# Patient Record
Sex: Female | Born: 1935 | Race: White | Hispanic: No | Smoking: Former smoker
Health system: Southern US, Community
[De-identification: ages and names within clinical notes are randomized; demographics above are authoritative.]

## PROBLEM LIST (undated history)

## (undated) DIAGNOSIS — Z85828 Personal history of other malignant neoplasm of skin: Secondary | ICD-10-CM

## (undated) DIAGNOSIS — Z8679 Personal history of other diseases of the circulatory system: Secondary | ICD-10-CM

## (undated) DIAGNOSIS — M1990 Unspecified osteoarthritis, unspecified site: Secondary | ICD-10-CM

## (undated) DIAGNOSIS — I442 Atrioventricular block, complete: Secondary | ICD-10-CM

## (undated) DIAGNOSIS — E7800 Pure hypercholesterolemia, unspecified: Secondary | ICD-10-CM

## (undated) DIAGNOSIS — I495 Sick sinus syndrome: Secondary | ICD-10-CM

## (undated) DIAGNOSIS — Z8719 Personal history of other diseases of the digestive system: Secondary | ICD-10-CM

## (undated) DIAGNOSIS — K449 Diaphragmatic hernia without obstruction or gangrene: Secondary | ICD-10-CM

## (undated) DIAGNOSIS — R159 Full incontinence of feces: Secondary | ICD-10-CM

## (undated) DIAGNOSIS — IMO0001 Reserved for inherently not codable concepts without codable children: Secondary | ICD-10-CM

## (undated) DIAGNOSIS — R32 Unspecified urinary incontinence: Secondary | ICD-10-CM

## (undated) DIAGNOSIS — K635 Polyp of colon: Secondary | ICD-10-CM

## (undated) DIAGNOSIS — K589 Irritable bowel syndrome without diarrhea: Secondary | ICD-10-CM

## (undated) DIAGNOSIS — I10 Essential (primary) hypertension: Secondary | ICD-10-CM

## (undated) DIAGNOSIS — G629 Polyneuropathy, unspecified: Secondary | ICD-10-CM

## (undated) DIAGNOSIS — I872 Venous insufficiency (chronic) (peripheral): Secondary | ICD-10-CM

## (undated) DIAGNOSIS — K5730 Diverticulosis of large intestine without perforation or abscess without bleeding: Secondary | ICD-10-CM

## (undated) DIAGNOSIS — I341 Nonrheumatic mitral (valve) prolapse: Secondary | ICD-10-CM

## (undated) DIAGNOSIS — B059 Measles without complication: Secondary | ICD-10-CM

## (undated) DIAGNOSIS — K219 Gastro-esophageal reflux disease without esophagitis: Secondary | ICD-10-CM

## (undated) DIAGNOSIS — Z5189 Encounter for other specified aftercare: Secondary | ICD-10-CM

## (undated) DIAGNOSIS — B269 Mumps without complication: Secondary | ICD-10-CM

## (undated) DIAGNOSIS — B029 Zoster without complications: Secondary | ICD-10-CM

## (undated) DIAGNOSIS — Z950 Presence of cardiac pacemaker: Secondary | ICD-10-CM

## (undated) DIAGNOSIS — Z8709 Personal history of other diseases of the respiratory system: Secondary | ICD-10-CM

## (undated) DIAGNOSIS — B019 Varicella without complication: Secondary | ICD-10-CM

## (undated) DIAGNOSIS — G6281 Critical illness polyneuropathy: Secondary | ICD-10-CM

## (undated) DIAGNOSIS — Z0000 Encounter for general adult medical examination without abnormal findings: Secondary | ICD-10-CM

## (undated) DIAGNOSIS — M797 Fibromyalgia: Secondary | ICD-10-CM

## (undated) DIAGNOSIS — E039 Hypothyroidism, unspecified: Secondary | ICD-10-CM

## (undated) DIAGNOSIS — Z8619 Personal history of other infectious and parasitic diseases: Secondary | ICD-10-CM

## (undated) DIAGNOSIS — N3281 Overactive bladder: Secondary | ICD-10-CM

## (undated) DIAGNOSIS — R197 Diarrhea, unspecified: Secondary | ICD-10-CM

## (undated) HISTORY — PX: TOTAL ABDOMINAL HYSTERECTOMY W/ BILATERAL SALPINGOOPHORECTOMY: SHX83

## (undated) HISTORY — DX: Encounter for other specified aftercare: Z51.89

## (undated) HISTORY — DX: Polyp of colon: K63.5

## (undated) HISTORY — DX: Gastro-esophageal reflux disease without esophagitis: K21.9

## (undated) HISTORY — DX: Hypothyroidism, unspecified: E03.9

## (undated) HISTORY — DX: Fibromyalgia: M79.7

## (undated) HISTORY — DX: Diarrhea, unspecified: R19.7

## (undated) HISTORY — DX: Unspecified urinary incontinence: R32

## (undated) HISTORY — DX: Presence of cardiac pacemaker: Z95.0

## (undated) HISTORY — PX: CERVICAL SPINE SURGERY: SHX589

## (undated) HISTORY — DX: Encounter for general adult medical examination without abnormal findings: Z00.00

## (undated) HISTORY — DX: Polyneuropathy, unspecified: G62.9

## (undated) HISTORY — DX: Full incontinence of feces: R15.9

## (undated) HISTORY — DX: Pure hypercholesterolemia, unspecified: E78.00

## (undated) HISTORY — DX: Unspecified osteoarthritis, unspecified site: M19.90

## (undated) HISTORY — DX: Irritable bowel syndrome, unspecified: K58.9

## (undated) HISTORY — DX: Critical illness polyneuropathy: G62.81

## (undated) HISTORY — DX: Diverticulosis of large intestine without perforation or abscess without bleeding: K57.30

## (undated) HISTORY — PX: TONSILLECTOMY: SUR1361

## (undated) HISTORY — DX: Personal history of other diseases of the respiratory system: Z87.09

## (undated) HISTORY — PX: COLONOSCOPY: SHX174

## (undated) HISTORY — DX: Mumps without complication: B26.9

## (undated) HISTORY — DX: Diaphragmatic hernia without obstruction or gangrene: K44.9

## (undated) HISTORY — DX: Personal history of other malignant neoplasm of skin: Z85.828

## (undated) HISTORY — DX: Sick sinus syndrome: I49.5

## (undated) HISTORY — PX: OTHER SURGICAL HISTORY: SHX169

## (undated) HISTORY — DX: Nonrheumatic mitral (valve) prolapse: I34.1

## (undated) HISTORY — DX: Personal history of other infectious and parasitic diseases: Z86.19

## (undated) HISTORY — DX: Measles without complication: B05.9

## (undated) HISTORY — DX: Atrioventricular block, complete: I44.2

## (undated) HISTORY — DX: Overactive bladder: N32.81

## (undated) HISTORY — PX: ESOPHAGOGASTRODUODENOSCOPY: SHX1529

## (undated) HISTORY — DX: Essential (primary) hypertension: I10

## (undated) HISTORY — DX: Venous insufficiency (chronic) (peripheral): I87.2

## (undated) HISTORY — DX: Personal history of other diseases of the circulatory system: Z86.79

## (undated) HISTORY — DX: Reserved for inherently not codable concepts without codable children: IMO0001

## (undated) HISTORY — DX: Personal history of other diseases of the digestive system: Z87.19

## (undated) HISTORY — DX: Zoster without complications: B02.9

## (undated) HISTORY — DX: Varicella without complication: B01.9

---

## 1969-10-14 HISTORY — PX: APPENDECTOMY: SHX54

## 1997-05-10 HISTORY — PX: CARDIAC CATHETERIZATION: SHX172

## 1998-04-24 ENCOUNTER — Other Ambulatory Visit: Admission: RE | Admit: 1998-04-24 | Discharge: 1998-04-24 | Payer: Self-pay | Admitting: Obstetrics and Gynecology

## 1999-04-26 ENCOUNTER — Other Ambulatory Visit: Admission: RE | Admit: 1999-04-26 | Discharge: 1999-04-26 | Payer: Self-pay | Admitting: Obstetrics and Gynecology

## 2000-04-28 ENCOUNTER — Other Ambulatory Visit: Admission: RE | Admit: 2000-04-28 | Discharge: 2000-04-28 | Payer: Self-pay | Admitting: Obstetrics and Gynecology

## 2001-06-08 ENCOUNTER — Other Ambulatory Visit: Admission: RE | Admit: 2001-06-08 | Discharge: 2001-06-08 | Payer: Self-pay | Admitting: Obstetrics and Gynecology

## 2002-01-03 ENCOUNTER — Emergency Department (HOSPITAL_COMMUNITY): Admission: EM | Admit: 2002-01-03 | Discharge: 2002-01-04 | Payer: Self-pay | Admitting: Emergency Medicine

## 2002-01-04 ENCOUNTER — Encounter: Payer: Self-pay | Admitting: Emergency Medicine

## 2002-12-24 ENCOUNTER — Encounter: Payer: Self-pay | Admitting: Emergency Medicine

## 2002-12-24 ENCOUNTER — Emergency Department (HOSPITAL_COMMUNITY): Admission: EM | Admit: 2002-12-24 | Discharge: 2002-12-24 | Payer: Self-pay | Admitting: Emergency Medicine

## 2003-08-11 ENCOUNTER — Encounter: Admission: RE | Admit: 2003-08-11 | Discharge: 2003-08-11 | Payer: Self-pay | Admitting: Obstetrics and Gynecology

## 2005-01-16 ENCOUNTER — Ambulatory Visit: Payer: Self-pay | Admitting: Pulmonary Disease

## 2005-01-17 ENCOUNTER — Ambulatory Visit: Payer: Self-pay | Admitting: Pulmonary Disease

## 2005-01-21 ENCOUNTER — Ambulatory Visit: Payer: Self-pay | Admitting: Internal Medicine

## 2005-02-05 ENCOUNTER — Ambulatory Visit: Payer: Self-pay | Admitting: Internal Medicine

## 2005-03-14 HISTORY — PX: CHOLECYSTECTOMY: SHX55

## 2005-03-20 ENCOUNTER — Encounter (INDEPENDENT_AMBULATORY_CARE_PROVIDER_SITE_OTHER): Payer: Self-pay | Admitting: *Deleted

## 2005-03-20 ENCOUNTER — Ambulatory Visit (HOSPITAL_COMMUNITY): Admission: RE | Admit: 2005-03-20 | Discharge: 2005-03-21 | Payer: Self-pay | Admitting: General Surgery

## 2005-07-17 ENCOUNTER — Ambulatory Visit: Payer: Self-pay | Admitting: Pulmonary Disease

## 2005-07-29 ENCOUNTER — Ambulatory Visit: Payer: Self-pay | Admitting: Pulmonary Disease

## 2005-08-02 ENCOUNTER — Ambulatory Visit (HOSPITAL_COMMUNITY): Admission: RE | Admit: 2005-08-02 | Discharge: 2005-08-03 | Payer: Self-pay | Admitting: Cardiovascular Disease

## 2006-01-13 ENCOUNTER — Ambulatory Visit: Payer: Self-pay | Admitting: Pulmonary Disease

## 2006-01-25 ENCOUNTER — Emergency Department (HOSPITAL_COMMUNITY): Admission: EM | Admit: 2006-01-25 | Discharge: 2006-01-25 | Payer: Self-pay | Admitting: Emergency Medicine

## 2006-07-17 ENCOUNTER — Ambulatory Visit: Payer: Self-pay | Admitting: Internal Medicine

## 2006-07-21 ENCOUNTER — Ambulatory Visit: Payer: Self-pay | Admitting: Cardiovascular Disease

## 2007-01-14 ENCOUNTER — Ambulatory Visit: Payer: Self-pay | Admitting: Pulmonary Disease

## 2007-01-16 ENCOUNTER — Ambulatory Visit: Payer: Self-pay | Admitting: Internal Medicine

## 2007-02-05 ENCOUNTER — Encounter: Payer: Self-pay | Admitting: Internal Medicine

## 2007-02-05 ENCOUNTER — Ambulatory Visit: Payer: Self-pay | Admitting: Internal Medicine

## 2007-02-05 DIAGNOSIS — K5730 Diverticulosis of large intestine without perforation or abscess without bleeding: Secondary | ICD-10-CM

## 2007-02-05 HISTORY — DX: Diverticulosis of large intestine without perforation or abscess without bleeding: K57.30

## 2007-07-31 ENCOUNTER — Emergency Department (HOSPITAL_COMMUNITY): Admission: EM | Admit: 2007-07-31 | Discharge: 2007-07-31 | Payer: Self-pay | Admitting: Emergency Medicine

## 2008-02-02 ENCOUNTER — Encounter: Payer: Self-pay | Admitting: Pulmonary Disease

## 2008-02-29 ENCOUNTER — Encounter: Payer: Self-pay | Admitting: Pulmonary Disease

## 2008-02-29 DIAGNOSIS — G609 Hereditary and idiopathic neuropathy, unspecified: Secondary | ICD-10-CM | POA: Insufficient documentation

## 2008-02-29 DIAGNOSIS — E039 Hypothyroidism, unspecified: Secondary | ICD-10-CM

## 2008-02-29 DIAGNOSIS — Z86010 Personal history of colonic polyps: Secondary | ICD-10-CM

## 2008-02-29 DIAGNOSIS — Z85828 Personal history of other malignant neoplasm of skin: Secondary | ICD-10-CM

## 2008-02-29 DIAGNOSIS — Z8679 Personal history of other diseases of the circulatory system: Secondary | ICD-10-CM | POA: Insufficient documentation

## 2008-02-29 DIAGNOSIS — I059 Rheumatic mitral valve disease, unspecified: Secondary | ICD-10-CM | POA: Insufficient documentation

## 2008-02-29 DIAGNOSIS — IMO0001 Reserved for inherently not codable concepts without codable children: Secondary | ICD-10-CM

## 2008-02-29 DIAGNOSIS — K449 Diaphragmatic hernia without obstruction or gangrene: Secondary | ICD-10-CM | POA: Insufficient documentation

## 2008-02-29 DIAGNOSIS — I10 Essential (primary) hypertension: Secondary | ICD-10-CM

## 2008-05-23 ENCOUNTER — Encounter: Payer: Self-pay | Admitting: Pulmonary Disease

## 2008-05-27 ENCOUNTER — Telehealth (INDEPENDENT_AMBULATORY_CARE_PROVIDER_SITE_OTHER): Payer: Self-pay | Admitting: *Deleted

## 2008-06-22 ENCOUNTER — Ambulatory Visit: Payer: Self-pay | Admitting: Pulmonary Disease

## 2008-06-22 DIAGNOSIS — J869 Pyothorax without fistula: Secondary | ICD-10-CM | POA: Insufficient documentation

## 2008-06-23 DIAGNOSIS — I4891 Unspecified atrial fibrillation: Secondary | ICD-10-CM

## 2008-06-23 DIAGNOSIS — G6281 Critical illness polyneuropathy: Secondary | ICD-10-CM

## 2008-06-23 DIAGNOSIS — I872 Venous insufficiency (chronic) (peripheral): Secondary | ICD-10-CM

## 2008-06-28 ENCOUNTER — Ambulatory Visit: Payer: Self-pay | Admitting: Pulmonary Disease

## 2008-07-10 LAB — CONVERTED CEMR LAB
ALT: 15 units/L (ref 0–35)
AST: 21 units/L (ref 0–37)
Albumin: 4 g/dL (ref 3.5–5.2)
Alkaline Phosphatase: 75 units/L (ref 39–117)
BUN: 11 mg/dL (ref 6–23)
Basophils Absolute: 0 10*3/uL (ref 0.0–0.1)
Basophils Relative: 0.6 % (ref 0.0–3.0)
Bilirubin, Direct: 0.1 mg/dL (ref 0.0–0.3)
CO2: 29 meq/L (ref 19–32)
Calcium: 9.4 mg/dL (ref 8.4–10.5)
Chloride: 110 meq/L (ref 96–112)
Cholesterol: 268 mg/dL (ref 0–200)
Creatinine, Ser: 1.2 mg/dL (ref 0.4–1.2)
Direct LDL: 148.9 mg/dL
Eosinophils Absolute: 0.1 10*3/uL (ref 0.0–0.7)
Eosinophils Relative: 2 % (ref 0.0–5.0)
GFR calc Af Amer: 56 mL/min
GFR calc non Af Amer: 46 mL/min
Glucose, Bld: 95 mg/dL (ref 70–99)
HCT: 42 % (ref 36.0–46.0)
HDL: 41.6 mg/dL (ref >39.0)
Hemoglobin: 14.5 g/dL (ref 12.0–15.0)
Lymphocytes Relative: 20.4 % (ref 12.0–46.0)
MCHC: 34.4 g/dL (ref 30.0–36.0)
MCV: 91.1 fL (ref 78.0–100.0)
Monocytes Absolute: 0.5 10*3/uL (ref 0.1–1.0)
Monocytes Relative: 8.8 % (ref 3.0–12.0)
Neutro Abs: 3.9 10*3/uL (ref 1.4–7.7)
Neutrophils Relative %: 68.2 % (ref 43.0–77.0)
Platelets: 200 10*3/uL (ref 150–400)
Potassium: 4.5 meq/L (ref 3.5–5.1)
RBC: 4.61 M/uL (ref 3.87–5.11)
RDW: 12.3 % (ref 11.5–14.6)
Sodium: 142 meq/L (ref 135–145)
TSH: 5.09 microintl units/mL (ref 0.35–5.50)
Total Bilirubin: 0.8 mg/dL (ref 0.3–1.2)
Total CHOL/HDL Ratio: 6.4
Total Protein: 7.5 g/dL (ref 6.0–8.3)
Triglycerides: 220 mg/dL (ref 0–149)
VLDL: 44 mg/dL — ABNORMAL HIGH (ref 0–40)
Vit D, 1,25-Dihydroxy: 16 — ABNORMAL LOW (ref 30–89)
WBC: 5.7 10*3/uL (ref 4.5–10.5)

## 2008-08-09 ENCOUNTER — Encounter: Payer: Self-pay | Admitting: Pulmonary Disease

## 2008-08-24 ENCOUNTER — Encounter: Payer: Self-pay | Admitting: Pulmonary Disease

## 2008-10-14 LAB — HM PAP SMEAR

## 2008-11-08 ENCOUNTER — Encounter: Payer: Self-pay | Admitting: Pulmonary Disease

## 2008-11-16 ENCOUNTER — Telehealth (INDEPENDENT_AMBULATORY_CARE_PROVIDER_SITE_OTHER): Payer: Self-pay | Admitting: *Deleted

## 2008-11-29 ENCOUNTER — Encounter: Payer: Self-pay | Admitting: Pulmonary Disease

## 2008-12-08 ENCOUNTER — Telehealth (INDEPENDENT_AMBULATORY_CARE_PROVIDER_SITE_OTHER): Payer: Self-pay | Admitting: *Deleted

## 2009-02-14 ENCOUNTER — Telehealth (INDEPENDENT_AMBULATORY_CARE_PROVIDER_SITE_OTHER): Payer: Self-pay | Admitting: *Deleted

## 2009-03-03 ENCOUNTER — Encounter: Payer: Self-pay | Admitting: Pulmonary Disease

## 2009-04-06 ENCOUNTER — Encounter: Payer: Self-pay | Admitting: Pulmonary Disease

## 2009-09-04 ENCOUNTER — Encounter: Payer: Self-pay | Admitting: Pulmonary Disease

## 2009-10-17 ENCOUNTER — Ambulatory Visit: Payer: Self-pay | Admitting: Pulmonary Disease

## 2009-10-17 DIAGNOSIS — E7800 Pure hypercholesterolemia, unspecified: Secondary | ICD-10-CM

## 2009-10-17 DIAGNOSIS — E559 Vitamin D deficiency, unspecified: Secondary | ICD-10-CM | POA: Insufficient documentation

## 2009-10-18 LAB — CONVERTED CEMR LAB
ALT: 22 units/L (ref 0–35)
AST: 26 units/L (ref 0–37)
Albumin: 3.5 g/dL (ref 3.5–5.2)
Alkaline Phosphatase: 66 units/L (ref 39–117)
BUN: 14 mg/dL (ref 6–23)
Basophils Absolute: 0 10*3/uL (ref 0.0–0.1)
Basophils Relative: 0.1 % (ref 0.0–3.0)
Bilirubin, Direct: 0.1 mg/dL (ref 0.0–0.3)
CO2: 28 meq/L (ref 19–32)
Calcium: 9.1 mg/dL (ref 8.4–10.5)
Chloride: 106 meq/L (ref 96–112)
Cholesterol: 152 mg/dL (ref 0–200)
Creatinine, Ser: 1.3 mg/dL — ABNORMAL HIGH (ref 0.4–1.2)
Eosinophils Absolute: 0.1 10*3/uL (ref 0.0–0.7)
Eosinophils Relative: 2.4 % (ref 0.0–5.0)
Folate: 6.4 ng/mL
GFR calc non Af Amer: 41.75 mL/min (ref >60)
Glucose, Bld: 94 mg/dL (ref 70–99)
HCT: 39.3 % (ref 36.0–46.0)
HDL: 48.4 mg/dL (ref >39.00)
Hemoglobin: 13 g/dL (ref 12.0–15.0)
Hgb A1c MFr Bld: 5.2 % (ref 4.6–6.5)
Iron: 59 ug/dL (ref 42–145)
LDL Cholesterol: 82 mg/dL (ref 0–99)
Lymphocytes Relative: 11.4 % — ABNORMAL LOW (ref 12.0–46.0)
Lymphs Abs: 0.7 10*3/uL (ref 0.7–4.0)
MCHC: 33.2 g/dL (ref 30.0–36.0)
MCV: 94.3 fL (ref 78.0–100.0)
Monocytes Absolute: 0.4 10*3/uL (ref 0.1–1.0)
Monocytes Relative: 6.7 % (ref 3.0–12.0)
Neutro Abs: 5 10*3/uL (ref 1.4–7.7)
Neutrophils Relative %: 79.4 % — ABNORMAL HIGH (ref 43.0–77.0)
Platelets: 161 10*3/uL (ref 150.0–400.0)
Potassium: 4.2 meq/L (ref 3.5–5.1)
Pro B Natriuretic peptide (BNP): 321 pg/mL — ABNORMAL HIGH (ref 0.0–100.0)
RBC: 4.17 M/uL (ref 3.87–5.11)
RDW: 11.9 % (ref 11.5–14.6)
Saturation Ratios: 16.7 % — ABNORMAL LOW (ref 20.0–50.0)
Sed Rate: 30 mm/hr — ABNORMAL HIGH (ref 0–22)
Sodium: 141 meq/L (ref 135–145)
TSH: 4.59 microintl units/mL (ref 0.35–5.50)
Total Bilirubin: 0.7 mg/dL (ref 0.3–1.2)
Total CHOL/HDL Ratio: 3
Total Protein: 6.9 g/dL (ref 6.0–8.3)
Transferrin: 252.9 mg/dL (ref 212.0–360.0)
Triglycerides: 109 mg/dL (ref 0.0–149.0)
VLDL: 21.8 mg/dL (ref 0.0–40.0)
Vit D, 25-Hydroxy: 34 ng/mL (ref 30–89)
Vitamin B-12: 267 pg/mL (ref 211–911)
WBC: 6.2 10*3/uL (ref 4.5–10.5)

## 2009-11-22 ENCOUNTER — Encounter: Payer: Self-pay | Admitting: Pulmonary Disease

## 2009-12-04 ENCOUNTER — Encounter: Payer: Self-pay | Admitting: Pulmonary Disease

## 2010-01-17 ENCOUNTER — Encounter: Payer: Self-pay | Admitting: Pulmonary Disease

## 2010-03-26 ENCOUNTER — Telehealth (INDEPENDENT_AMBULATORY_CARE_PROVIDER_SITE_OTHER): Payer: Self-pay | Admitting: *Deleted

## 2010-04-19 ENCOUNTER — Ambulatory Visit: Payer: Self-pay | Admitting: Pulmonary Disease

## 2010-04-19 ENCOUNTER — Encounter (INDEPENDENT_AMBULATORY_CARE_PROVIDER_SITE_OTHER): Payer: Self-pay | Admitting: *Deleted

## 2010-04-19 DIAGNOSIS — K589 Irritable bowel syndrome without diarrhea: Secondary | ICD-10-CM

## 2010-06-07 ENCOUNTER — Encounter: Payer: Self-pay | Admitting: Pulmonary Disease

## 2010-08-01 ENCOUNTER — Encounter: Payer: Self-pay | Admitting: Pulmonary Disease

## 2010-08-24 ENCOUNTER — Encounter: Payer: Self-pay | Admitting: Pulmonary Disease

## 2010-08-24 HISTORY — PX: NM MYOCAR PERF WALL MOTION: HXRAD629

## 2010-09-09 ENCOUNTER — Inpatient Hospital Stay (HOSPITAL_COMMUNITY): Admission: EM | Admit: 2010-09-09 | Discharge: 2010-09-13 | Payer: Self-pay | Admitting: Emergency Medicine

## 2010-09-09 ENCOUNTER — Encounter (INDEPENDENT_AMBULATORY_CARE_PROVIDER_SITE_OTHER): Payer: Self-pay | Admitting: Emergency Medicine

## 2010-09-17 ENCOUNTER — Telehealth (INDEPENDENT_AMBULATORY_CARE_PROVIDER_SITE_OTHER): Payer: Self-pay | Admitting: *Deleted

## 2010-09-24 ENCOUNTER — Ambulatory Visit: Payer: Self-pay | Admitting: Pulmonary Disease

## 2010-09-24 DIAGNOSIS — L02419 Cutaneous abscess of limb, unspecified: Secondary | ICD-10-CM | POA: Insufficient documentation

## 2010-09-24 DIAGNOSIS — L03119 Cellulitis of unspecified part of limb: Secondary | ICD-10-CM

## 2010-10-29 ENCOUNTER — Encounter: Payer: Self-pay | Admitting: Pulmonary Disease

## 2010-11-04 ENCOUNTER — Encounter: Payer: Self-pay | Admitting: Internal Medicine

## 2010-11-13 NOTE — Letter (Signed)
Summary: [facility]  [facility]   Imported By: Bubba Hales 12/25/2009 09:42:32  _____________________________________________________________________  External Attachment:    Type:   Image     Comment:   External Document

## 2010-11-13 NOTE — Letter (Signed)
Summary: New Patient letter  [facility]  [address] [place], [place] [address]   Phone: [phone]  Fax: [phone]       04/19/2010 MRN: [id number]  Jill Washington [place], [place]  [id number]  Dear Ms. Jill Washington,  Welcome to the Gastroenterology Division at [facility].    You are scheduled to see Dr. Carlean Purl on 06/12/2010 at 8:45AM on the 3rd floor at [facility], [place] [employer].  We ask that you try to arrive at our office 15 minutes prior to your appointment time to allow for check-in.  We would like you to complete the enclosed self-administered evaluation form prior to your visit and bring it with you on the day of your appointment.  We will review it with you.  Also, please bring a complete list of all your medications or, if you prefer, bring the medication bottles and we will list them.  Please bring your insurance card so that we may make a copy of it.  If your insurance requires a referral to see a specialist, please bring your referral form from your primary care physician.  Co-payments are due at the time of your visit and may be paid by cash, check or credit card.     Your office visit will consist of a consult with your physician (includes a physical exam), any laboratory testing he/she may order, scheduling of any necessary diagnostic testing (e.g. x-ray, ultrasound, CT-scan), and scheduling of a procedure (e.g. Endoscopy, Colonoscopy) if required.  Please allow enough time on your schedule to allow for any/all of these possibilities.    If you cannot keep your appointment, please call [phone] to cancel or reschedule prior to your appointment date.  This allows [place] the opportunity to schedule an appointment for another patient in need of care.  If you do not cancel or reschedule by 5 p.m. the business day prior to your appointment date, you will be charged a $50.00 late cancellation/no-show fee.    Thank you for  choosing [facility] for your medical needs.  We appreciate the opportunity to care for you.  Please visit [place] at our website  to learn more about our practice.                     Sincerely,                                                             The Gastroenterology Division

## 2010-11-13 NOTE — Progress Notes (Signed)
Summary: rx  Phone Note Call from Patient Call back at Home Phone [phone]   Caller: Patient Call For: nadel Reason for Call: Talk to Nurse Summary of Call: pt needs a refill on her warfarin and pravastatin called in to [facility] for 90 day supply w/refills. Initial call taken by: Zigmund Gottron,  March 26, 2010 3:14 PM  Follow-up for Phone Call        called spoke with patient, informed her that we will be happy to fill her pravastatin but since Dr. Lowella Fairy office does her coumadin she will have to contact his office for refills.  pt verbalized her understanding.  [facility]. Follow-up by: Parke Poisson CNA/MA,  March 26, 2010 3:34 PM    Prescriptions: PRAVASTATIN SODIUM 40 MG TABS (PRAVASTATIN SODIUM) Take 1 tablet by mouth once a day  #90 x 3   Entered by:   Parke Poisson CNA/MA   Authorized by:   Noralee Space MD   Signed by:   Parke Poisson CNA/MA on 03/26/2010   Method used:   Faxed to ...       [facility] Probation [place]  [address]       Ph: [phone]       Fax: [phone]   RxID:   [id number]

## 2010-11-13 NOTE — Letter (Signed)
Summary: [facility]   Imported By: Phillis Knack 01/30/2010 13:26:36  _____________________________________________________________________  External Attachment:    Type:   Image     Comment:   External Document

## 2010-11-13 NOTE — Assessment & Plan Note (Signed)
Summary: rov 6 months///kp   CC:  6 month ROV & review of mult medical problems....  History of Present Illness: 77 y/o WF here for a follow up visit... she has multiple medical problems as noted below... Followed for general medical purposes w/ hx of Empyema & critical illness in 1982 from which she made a nice recovery, HBP, MVP, AFib & SSS w/ pacer, Hyperchol, Hypothy, HH/ IBS/ Colon Polyps, Neuropathy/ FM... she is also followed by [name] for Cards, [name] for GI, & [name] for Neuro...   ~  October 17, 2009:  2mo ROV- incr stress> sold home, moved in w/ daugh, etc.. c/o some acute right post CWP from lifting wood for their wood stoves... sore tender- Rx w/ rest, heat, Tramadol + Tylenol... she sees [name] every 21mo- changed Amio to Multaq, Pacer OK, overall stable... she does c/o progresssive neuropathy symptoms in her legs- [name] tried change to Cymbalta but she reacted w/ trembling, weakness, confusion; therefore re-started her Gabapentin... we discussed referral to Neurology for Neuropathy f/u... she has been on PRAV40qhs & VitD 50K weekly for >7yr- check fasting labs.   ~  April 19, 2010:  states she sold house & now moving into trailer... c/o "stomach" problems w/ pain, gas, diarrhea> taking gas meds and immodium for her IBS & we discussed LEVSIN for the cramps & getting back on the [place] daily... she would like to f/u w/ [name] & we will help her to set this up... BP & cardiac stable on meds;  she remains on Coumadin via [facility] office;  Chol & Thyroid stable on meds;  she had Neuro f/u [name]> NCV w/ severe neuropathy- on Neurontin & Tramadol...    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  HYPERTENSION (ICD-401.9) - on ATENOLOL 50mg /d &  DIOVAN/Hct 160-12.5 daily... BP today = 150/68, taking meds  regularly, tolerating meds well... she denies HA, fatigue, visual changes, CP, palipit, dizziness, syncope, dyspnea, edema, etc... she will monitor BP at home for [place]...  MITRAL VALVE PROLAPSE (ICD-424.0) - no complaints of CP or palpit... [facility]cho 4/06 [facility] showed mild intermittent prolapse of the ant leaflet of the MV, norm LV size w/ mod ASH... ** note- cath in 1998 showed norm coronaries + MVP... NuclearStressTest 8/05 showed inferior wall artifact, no ischemia, EF= 69%...  ~  f/u Myoview 11/09 showed norm perfusion w/o infarct, w/o ischemia...  ~  repeat 2DEcho 6/10 showed norm LVF, norm valves, DD...  Hx of ATRIAL FIBRILLATION (ICD-427.31) & SICK SINUS SYNDROME (ICD-427.81) - on COUMADIN & MULTAQ 400mg Bid, followed by [facility]... pacemaker placed by [name] in 1998 ([facility]) and followed by him... Generator changed 10/06 and Pacer checked periodically- doing well...   ~  11/10:  [name] changed Amio to [place] 400mg Bid...  VENOUS INSUFFICIENCY (ICD-459.81) - she follows a low sodium diet, elevates legs, support hose when able...  HYPERCHOLESTEROLEMIA (ICD-272.0) - on PRAVASTATIN 40mg Qhs + diet & exercise...  ~  [place] 9/09 on diet alone showed TChol 268, TG 220, HDL 42, LDL 149... Prav40 started.  ~  [place] 1/11 on Prav40 showed TChol 152, TG 109, HDL 48, LDL 82... keep same.  HYPOTHYROIDISM (ICD-244.9) - on LEVOTHYROID 154mcg/d... she is due for f/u labs...  ~  last labs 10/08 by [name] showed TSH= 6.31... dose incr to 125 at that time...  ~  labs 9/09 on  [id number] showed TSH= 5.09  ~  labs 1/11 on Levo125 showed TSH= 4.59  HIATAL HERNIA (ICD-553.3) - on NEXIUM 40mg  Prn & she is asked to take this daily... last EGD 4/06 showed HH, otherw neg...  POLYP, COLON (ICD-211.3) - last colonoscopy 4/08 by [name] showed divertics, 64mm polyp removed= hyperplastic...  and some spasm- "IBS response" to scope...  she has LEVSIN 0.125mg  Prn for cramping, and IMMODIUM Prn for diarrhea.  FIBROMYALGIA (ICD-729.1) -  she uses TRAMADOL 50mg  Prn & METHOCARBAMOL Prn for musc spasm.  VITAMIN D DEFICIENCY (ICD-268.9) - prev on Vit D 50K weekly & switched to 1000 u OTC daily...  ~  labs 9/09 showed Vit D level = 16... start Vit D 50.  ~  labs 1/11 on VitD 50K showed Vit D level = 34... OK switch to 1000 u daily.  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  ~  11/10:  [name] tried switch Neurontin to Cymbalta for neuropathy symptoms- she reacted to the Cymbalta w/ trembling, weakness, confusion & changed back to GABAPENTIN 300mg  Qd... we discussed Neuro appt for further eval.  SKIN CANCER, HX OF (ICD-V10.83) - followed by Derm... 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  Health Maintenance - GYN= DrRKaplan but she understood him to say she didn't need f/u... had Mammogram & BMD in ?2006 but she's not sure...  takes Calcium, MVI, Vit D...   Allergies (verified): No Known Drug Allergies  Comments:  Nurse/Medical Assistant: The patient's medications and allergies were reviewed with the patient and were updated in the Medication and Allergy Lists.  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 Mother died age 31  from pancreatic cancer Father died age 34---from gall bladder disease 1 sibling alive age 2 w/ diabetes 1 sibling alive age 51 w/ arthritis 1 sibling deceased age 26 from kidney failure 1 sibling deceased age 16 from heart attack  Social History: Reviewed history from 06/22/2008 and no changes required. quit smoking in 1957 not exercising no caffeine use no alcohol widowed 3 children  Review of Systems      See HPI       The patient complains of decreased hearing, dyspnea on exertion, peripheral edema, muscle weakness, and difficulty walking.  The patient denies anorexia, fever, weight loss, weight gain, vision loss, hoarseness, chest pain, syncope, prolonged cough, headaches, hemoptysis, abdominal pain, melena, hematochezia, severe indigestion/heartburn, hematuria, incontinence, suspicious skin lesions, transient blindness, depression, unusual weight change, abnormal bleeding, enlarged lymph nodes, and angioedema.    Vital Signs:  Patient profile:   77 year old female Height:      66 inches Weight:      195 pounds O2 Sat:      96 % on Room air Temp:     97.3 degrees F oral Pulse rate:   83 / minute BP sitting:   150 / 68  (left arm) Cuff size:   regular  Vitals Entered By:  Elita Boone CMA (April 19, 2010 9:52 AM)  O2 Sat at Rest %:  96 O2 Flow:  Room air CC: 6 month ROV & review of mult medical problems...   Physical Exam  Additional Exam:  WD, WN, 77 y/o WF in NAD... GENERAL:  Alert & oriented; pleasant & cooperative... HEENT:  [place]/AT, EOM-wnl, PERRLA, EACs-clear, TMs-wnl, NOSE-clear, THROAT-clear & wnl. NECK:  Supple w/ fairROM; no JVD; normal carotid impulses w/o bruits; no thyromegaly or nodules palpated; no lymphadenopathy. CHEST:  Clear to P & A; without wheezes/ rales/ or rhonchi heard... HEART:  Regular Rhythm; pacer, gr 1-2/6 SEM S4 no rubs or S3... ABDOMEN:  Soft & nontender; normal bowel sounds; no organomegaly or masses detected. EXT: without deformities,  mod arthritic changes; no varicose veins/ +venous insuffic/ 1+edema. NEURO:  CN's intact; motor testing normal; sensory testing normal; gait normal & balance OK. DERM:  dry skin & port-wine birthmark on left chest...    MISC. Report  Procedure date:  04/19/2010  Findings:      DATA REVIEWED:  ~  prev EMR notes...  ~  01/17/10 OV note from [name]...  ~  11/22/09 Neuro consult from [name] & EMG/NCV...    Impression & Recommendations:  Problem # 1:  HYPERTENSION (ICD-401.9) Fair control>  reminded of no salt, continue same meds... Her updated medication list for this problem includes:    Atenolol 50 Mg Tabs (Atenolol) .Marland Kitchen... 1 by mouth once daily    Diovan Hct 160-12.5 Mg Tabs (Valsartan-hydrochlorothiazide) .Marland Kitchen... Take 1 tab by mouth once daily...  Problem # 2:  Hx of ATRIAL FIBRILLATION (ICD-427.31) On Coumadin by [facility], and followed regularly by [name]... continue same meds. Her updated medication list for this problem includes:    Coumadin 2 Mg Tabs (Warfarin sodium) .Marland Kitchen... Take as directed by drweintraub...    Atenolol 50 Mg Tabs (Atenolol) .Marland Kitchen... 1 by mouth once daily    Multaq 400 Mg Tabs (Dronedarone hcl) .Marland Kitchen... Take 1 tab by mouth two times a day...  Problem # 3:  HYPERCHOLESTEROLEMIA (ICD-272.0) Stable on Prav40... continue same + diet Rx. Her updated medication list for this problem includes:    Pravastatin Sodium 40 Mg Tabs (Pravastatin sodium) .Marland Kitchen... Take 1 tablet by mouth once a day  Problem # 4:  HYPOTHYROIDISM (ICD-244.9) Stable on Synthroid 125> continue same. Her updated medication list for this problem includes:    Levothroid 125 Mcg Tabs (Levothyroxine sodium) .Marland Kitchen... Take 1 tablet by mouth once a day  Problem # 5:  HIATAL HERNIA (ICD-553.3) We discussed taking the Nexium daily... Her updated medication list for this problem includes:    Nexium 40 Mg Cpdr (Esomeprazole magnesium) .Marland Kitchen... Take 1 cap by mouth 30 min before the 1st meal of the day...     Hyoscyamine Sulfate 0.125 Mg Tabs (Hyoscyamine sulfate) .Marland Kitchen... Take 1 tab by mouth every 6h as needed for abd cramping...  Problem # 6:  IBS>>> Added Levsin Q4H as needed... + Immodium for diarrhea, and Simethacone for gas... she requests f/u GI [name].  Problem # 7:  PERIPHERAL NEUROPATHY (ICD-356.9) Severe Neuroopathy w/ eval from [name]...  Problem # 8:  OTHER MEDICAL PROBLEMS AS NOTED>>>  Complete Medication List: 1)  Coumadin 2 Mg Tabs (Warfarin sodium) .... Take as directed by drweintraub.Marland KitchenMarland Kitchen 2)  Atenolol 50 Mg Tabs (Atenolol) .Marland Kitchen.. 1 by mouth once daily 3)  Diovan Hct 160-12.5 Mg Tabs (Valsartan-hydrochlorothiazide) .... Take 1 tab by mouth once daily.Marland KitchenMarland Kitchen 4)  Multaq 400 Mg Tabs (Dronedarone hcl) .Marland KitchenMarland KitchenMarland Kitchen  Take 1 tab by mouth two times a day... 5)  Pravastatin Sodium 40 Mg Tabs (Pravastatin sodium) .... Take 1 tablet by mouth once a day 6)  Levothroid 125 Mcg Tabs (Levothyroxine sodium) .... Take 1 tablet by mouth once a day 7)  Nexium 40 Mg Cpdr (Esomeprazole magnesium) .... Take 1 cap by mouth 30 min before the 1st meal of the day... 8)  Hyoscyamine Sulfate 0.125 Mg Tabs (Hyoscyamine sulfate) .... Take 1 tab by mouth every 6h as needed for abd cramping... 9)  Imodium A-d 2 Mg Tabs (Loperamide hcl) .... Take 1-2 tabs by mouth as needed for diarrhea... 10)  Gabapentin 300 Mg Caps (Gabapentin) .... Take 1 tablet by mouth once a day 11)  Tramadol Hcl 50 Mg Tabs (Tramadol hcl) .... Take 1 tab by mouth every 6-8h as needed for pain (may take w/ tylenol) 12)  Womens Multivitamin Plus Tabs (Multiple vitamins-minerals) .... Take 1 tab by mouth once daily...  Other Orders: Gastroenterology Referral (GI)  Patient Instructions: 1)  Today we updated your med list- see below.... 2)  We refilled the meds you requested today... 3)  For the Abd Cramping:  take the generic Levsin every [place] or so as needed... 4)  For the Gas: you may use OTC Gas-X, Bean-O, or MYLICON (simethacone)>> take up to 4  times daily as needed... 5)  We will sched a follow up eval w/ [name] for you... 6)  Call for any questions.Marland KitchenMarland Kitchen 7)  Please schedule a follow-up appointment in 6 months. Prescriptions: NEXIUM 40 MG CPDR (ESOMEPRAZOLE MAGNESIUM) take 1 cap by mouth 30 min before the 1st meal of the day...  #30 x 11   Entered and Authorized by:   Noralee Space MD   Signed by:   Noralee Space MD on 04/19/2010   Method used:   Print then Give to Patient   [id number] TRAMADOL HCL 50 MG TABS (TRAMADOL HCL) take 1 tab by mouth every 6-8H as needed for pain (may take w/ Tylenol)  #100 x 11   Entered and Authorized by:   Noralee Space MD   Signed by:   Noralee Space MD on 04/19/2010   Method used:   Print then Give to Patient   RxID:   [id number] HYOSCYAMINE SULFATE 0.125 MG TABS (HYOSCYAMINE SULFATE) take 1 tab by mouth every [place] as needed for abd cramping...  #100 x 11   Entered and Authorized by:   Noralee Space MD   Signed by:   Noralee Space MD on 04/19/2010   Method used:   Print then Give to Patient   RxID:   [id number]

## 2010-11-13 NOTE — Assessment & Plan Note (Signed)
Summary: ROV//MBW   CC:  16 month ROV & review of mult medical problems....  History of Present Illness: 77 y/o WF here for a follow up visit... she has multiple medical problems as noted below...    ~  Sep09:  since I saw her last in 01/21/2023 her husb has passed away (intra-peritoneal carcinoma of ? primary), she has been under some stress but she is holding-her-own and improving slowly... she is also followed by [name] w/ hx AFib, SSS, pacer- we do not have a recent note from his office...   ~  October 17, 2009:  50mo ROV- incr stress> sold home, moved in w/ daugh, etc.. c/o some acute right post CWP from lifting wood for their wood stoves... sore tender- Rx w/ rest, heat, Tramadol + Tylenol... she sees [name] every 32mo- changed Amio to Multaq, Pacer OK, overall stable... she does c/o progresssive neuropathy symptoms in her legs- [name] tried change to Cymbalta but she reacted w/ trembling, weakness, confusion; therefore re-started her Gabapentin... we discussed referral to Neurology for Neuropathy f/u... she has been on PRAV40qhs & VitD 50K weekly for >25yr- check fasting labs.    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  HYPERTENSION (ICD-401.9) - on ATENOLOL 50mg /d &  DIOVAN/Hct 160-12.5 daily... BP today = 150/88, taking meds regularly, tolerating meds well... she denies HA, fatigue, visual changes, CP, palipit, dizziness, syncope, dyspnea, edema, etc... she will monitor BP at home for [place]...  MITRAL VALVE PROLAPSE (ICD-424.0) - no complaints of CP or palpit... [facility] 4/[facility] showed mild intermittent prolapse of the ant leaflet of the MV, norm LV size w/ mod ASH... ** note- cath in 1998 showed norm coronaries + MVP... NuclearStressTest 8/05 showed inferior wall artifact, no ischemia, EF= 69%...  ~  f/u Myoview 11/09  showed norm perfusion w/o infarct, w/o ischemia...  ~  repeat 2DEcho 6/10 showed norm LVF, norm valves, DD...  Hx of ATRIAL FIBRILLATION (ICD-427.31) & SICK SINUS SYNDROME (ICD-427.81) - on COUMADIN & MULTAQ 400mg Bid, followed by [facility]... pacemaker placed by [name] in 1998 and followed by him... Generator changed 10/06 and Pacer checked 11/10- doing well...   ~  11/10:  [name] changed Amio to [place] 400mg Bid...  VENOUS INSUFFICIENCY (ICD-459.81) - she follows a low sodium diet, elevates legs, support hose when able...  HYPERCHOLESTEROLEMIA (ICD-272.0) - on PRAVASTATIN 40mg Qhs + diet & exercise...  ~  [place] 9/09 on diet alone showed TChol 268, TG 220, HDL 42, LDL 149... Prav40 started.  ~  [place] 1/11 on Prav40 showed TChol 152, TG 109, HDL 48, LDL 82... keep same.  HYPOTHYROIDISM (ICD-244.9) - on LEVOTHYROID 167mcg/d... she is due for f/u labs...  ~  last labs 10/08 by [name] showed TSH= 6.31... dose incr to 125 at that time...  ~  labs 9/09 on Levo125 showed TSH= 5.09  ~  labs 1/11 on Levo125 showed TSH= 4.59  HIATAL HERNIA (ICD-553.3) - on Prilosec OTC 20mg  Prn... last EGD 4/06 showed HH, otherw neg...  POLYP, COLON (ICD-211.3) - last colonoscopy Jan 21, 2023 by [name] showed divertics, 66mm polyp removed= hyperplastic...  and some spasm- "IBS response" to scope... she uses Immodium and Levsin as needed.  FIBROMYALGIA (ICD-729.1)  VITAMIN D DEFICIENCY (ICD-268.9) - on Vit D 50K weekly for the last 56mo & ready to switch to 1000 u OTC daily...  ~  labs  9/09 showed Vit D level = 16... start Vit D 50.  ~  labs 1/11 on VitD 50K showed Vit D level = 34... OK switch to 1000 u daily.  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  ~  11/10:  [name] tried switch Neurontin to Cymbalta for neuropathy symptoms- she reacted to the Cymbalta w/ trembling, weakness, confusion & changed back to GABAPENTIN 300mg  Qd... we  discussed Neuro appt for further eval.  SKIN CANCER, HX OF (ICD-V10.83) - followed by Derm... 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  Health Maintenance - GYN= DrRKaplan but she understood him to say she didn't need f/u... had Mammogram & BMD in ?2006 but she's not sure...  takes Calcium, MVI, Vit D...    Allergies (verified): No Known Drug Allergies  Comments:  Nurse/Medical Assistant: The patient's medications and allergies were reviewed with the patient and were updated in the Medication and Allergy Lists.  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 Mother died age 73 from pancreatic cancer Father died age 9---from gall bladder disease 1 sibling alive age 18 w/ diabetes 1 sibling alive age 62 w/ arthritis 1 sibling deceased age 58 from kidney failure 1 sibling deceased age 93 from heart attack  Social History: Reviewed history from 06/22/2008 and no changes required. quit smoking in 1957 not exercising no caffeine use no alcohol widowed 3 children  Review of Systems       The patient complains of chest pain, dyspnea on exertion, cough, back pain, joint pain, stiffness, arthritis, and anxiety.  The patient denies fever, chills, sweats, anorexia, fatigue, weakness, malaise, weight  loss, sleep disorder, blurring, diplopia, eye irritation, eye discharge, vision loss, eye pain, photophobia, earache, ear discharge, tinnitus, decreased hearing, nasal congestion, nosebleeds, sore throat, hoarseness, palpitations, syncope, orthopnea, PND, peripheral edema, dyspnea at rest, excessive sputum, hemoptysis, wheezing, pleurisy, nausea, vomiting, diarrhea, constipation, change in bowel habits, abdominal pain, melena, hematochezia, jaundice, gas/bloating, indigestion/heartburn, dysphagia, odynophagia, dysuria, hematuria, urinary frequency, urinary hesitancy, nocturia, incontinence, joint swelling, muscle cramps, muscle weakness, sciatica, restless legs, leg pain at night, leg pain with exertion, rash, itching, dryness, suspicious lesions, paralysis, paresthesias, seizures, tremors, vertigo, transient blindness, frequent falls, frequent headaches, difficulty walking, depression, memory loss, confusion, cold intolerance, heat intolerance, polydipsia, polyphagia, polyuria, unusual weight change, abnormal bruising, bleeding, enlarged lymph nodes, urticaria, allergic rash, hay fever, and recurrent infections.    Vital Signs:  Patient profile:   77 year old female Height:      66 inches Weight:      190.38 pounds BMI:     30.84 O2 Sat:      96 % on Room air Temp:     97.2 degrees F oral Pulse rate:   75 / minute BP sitting:   150 / 88  (left arm) Cuff size:   regular  Vitals Entered By: Elita Boone CMA (October 17, 2009 9:30 AM)  O2 Sat at Rest %:  96 O2  Flow:  Room air CC: 16 month ROV & review of mult medical problems... Comments meds updated today   Physical Exam  Additional Exam:  WD, WN, 77 y/o WF in NAD... GENERAL:  Alert & oriented; pleasant & cooperative... HEENT:  [place]/AT, EOM-wnl, PERRLA, EACs-clear, TMs-wnl, NOSE-clear, THROAT-clear & wnl. NECK:  Supple w/ fairROM; no JVD; normal carotid impulses w/o bruits; no thyromegaly or nodules palpated; no lymphadenopathy. CHEST:   Clear to P & A; without wheezes/ rales/ or rhonchi heard... HEART:  Regular Rhythm; pacer, gr 1-2/6 SEM S4 no rubs or S3... ABDOMEN:  Soft & nontender; normal bowel sounds; no organomegaly or masses detected. EXT: without deformities, mod arthritic changes; no varicose veins/ +venous insuffic/ 1+edema. NEURO:  CN's intact; motor testing normal; sensory testing normal; gait normal & balance OK. DERM:  dry skin & port-wine birthmark on left chest...     CXR  Procedure date:  10/17/2009  Findings:      CHEST - 2 VIEW   Comparison: 07/31/2007.   Findings: Mild cardiomegaly is identified. A right-sided pacemaker is unchanged. Mild bibasilar scarring is stable. COPD/emphysema again identified. There is no evidence of focal airspace disease, pulmonary edema, pleural effusion, or pneumothorax. No acute bony abnormalities are identified.   IMPRESSION: No evidence of acute cardiopulmonary disease.   Read By:  Lura Em,  M.D.      Comments:      I have personally reviewed this XRay on the Image[employer] system- SN   X-ray  Procedure date:  10/17/2009  Findings:      RIGHT RIBS - 2 VIEW   Comparison: July 31, 2007   Findings: There is no evidence of right rib fracture or pneumothorax.  Chondrocalcinosis is noted at the right shoulder.   IMPRESSION: There is no evidence of right rib fracture or pneumothorax.   Read By:  Evlyn Courier,  M.D.     MISC. Report  Procedure date:  10/17/2009  Findings:        Cholesterol               152 mg/dL                   0-200   Triglycerides             109.0 mg/dL                 0.0-149.0   HDL                       48.40 mg/dL                 >39.00   VLDL Cholesterol          21.8 mg/dL                  0.0-40.0   LDL Cholesterol           82 mg/dL                    0-99     Sodium                    141 mEq/L                   135-145   Potassium                 4.2 mEq/L  3.5-5.1   Chloride                   106 mEq/L                   96-112   Carbon Dioxide            28 mEq/L                    19-32   Glucose                   94 mg/dL                    70-99   BUN                       14 mg/dL                    6-23   Creatinine           [H]  1.3 mg/dL                   0.4-1.2   Calcium                   9.1 mg/dL                   8.4-10.5   GFR                       41.75 mL/min                >60     White Cell Count          6.2 K/uL                    4.5-10.5   Red Cell Count            4.17 Mil/uL                 3.87-5.11   Hemoglobin                13.0 g/dL                   12.0-15.0   Hematocrit                39.3 %                      36.0-46.0   MCV                       94.3 fl                     78.0-100.0   Platelet Count            161.0 K/uL       Neutrophil %         [H]  79.4 %                      43.0-77.0   Lymphocyte %         [L]  11.4 %                      12.0-46.0   Monocyte %  6.7 %                       3.0-12.0   Eosinophils%              2.4 %                       0.0-5.0   Basophils %               0.1 %  Comments:        Total Bilirubin           0.7 mg/dL                   0.3-1.2   Direct Bilirubin          0.1 mg/dL                   0.0-0.3   Alkaline Phosphatase      66 U/L                      39-117   AST                       26 U/L                      0-37   ALT                       22 U/L                      0-35   Total Protein             6.9 g/dL                    6.0-8.3   Albumin                   3.5 g/dL                    3.5-5.2     FastTSH                   4.59 uIU/mL                 0.35-5.50     Vitamin B12               267 pg/mL                   211-911   Folate                    6.4 ng/mL       Normal  >5.4 ng/mL     Iron                      59 ug/dL                    42-145   Transferrin               252.9 mg/dL                 212.0-360.0   Iron Saturation      [L]  16.7 %  20.0-50.0    B-Type Natriuetic Peptide                        [H]  321.0 pg/mL                 0.0-100.0     Hemoglobin A1C            5.2 %                       4.6-6.5     Sed Rate             [H]  30 mm/hr                    0-22    Impression & Recommendations:  Problem # 1:  Hx of EMPYEMA (ICD-510.9) Stable-  she has right CWP- XRay ribs neg...  REC> rest the chest, heat, Tramadol,Tylenol, etc...  Orders: T-2 View CXR ([id number]) T-Ribs Unilateral 2 Views (71100TC)  Problem # 2:  HYPERTENSION (ICD-401.9) Fair control-  discussed diet exercise etc... same meds for now... Her updated medication list for this problem includes:    Atenolol 50 Mg Tabs (Atenolol) .Marland Kitchen... 1 by mouth once daily    Diovan Hct 160-12.5 Mg Tabs (Valsartan-hydrochlorothiazide) .Marland Kitchen... Take 1 tab by mouth once daily...  Orders: T-2 View CXR ([id number]) T-Ribs Unilateral 2 Views (71100TC) Venipuncture [id number]) TLB-Lipid Panel (80061-LIPID) TLB-BMP (Basic Metabolic Panel-BMET) (99991111) TLB-CBC Platelet - w/Differential (85025-CBCD) TLB-Hepatic/Liver Function Pnl (80076-HEPATIC) TLB-TSH (Thyroid Stimulating Hormone) (84443-TSH) TLB-B12 + Folate Pnl [id number]) TLB-IBC Pnl (Iron/FE;Transferrin) (83550-IBC) TLB-BNP (B-Natriuretic Peptide) (83880-BNPR) TLB-A1C / Hgb A1C (Glycohemoglobin) (83036-A1C) TLB-Sedimentation Rate (ESR) (85652-ESR) T-Vitamin D (25-Hydroxy) AZ:[id number])  Problem # 3:  MITRAL VALVE PROLAPSE (ICD-424.0) Aware-  stable... Her updated medication list for this problem includes:    Coumadin 2 Mg Tabs (Warfarin sodium) .Marland Kitchen... Take as directed by drweintraub...    Atenolol 50 Mg Tabs (Atenolol) .Marland Kitchen... 1 by mouth once daily    Multaq 400 Mg Tabs (Dronedarone hcl) .Marland Kitchen... Take 1 tab by mouth two times a day...  Problem # 4:  SICK SINUS SYNDROME (ICD-427.81) Has pacer & followed by RW... Her updated medication list for this problem includes:    Coumadin  2 Mg Tabs (Warfarin sodium) .Marland Kitchen... Take as directed by drweintraub...    Atenolol 50 Mg Tabs (Atenolol) .Marland Kitchen... 1 by mouth once daily    Multaq 400 Mg Tabs (Dronedarone hcl) .Marland Kitchen... Take 1 tab by mouth two times a day...  Problem # 5:  VENOUS INSUFFICIENCY (ICD-459.81) Discussed no salt, elevation, support hose, etc...  Problem # 6:  HYPOTHYROIDISM (ICD-244.9) Stable-  same med. Her updated medication list for this problem includes:    Levothroid 125 Mcg Tabs (Levothyroxine sodium) .Marland Kitchen... Take 1 tablet by mouth once a day  Problem # 7:  HIATAL HERNIA (ICD-553.3) GI is stable... The following medications were removed from the medication list:    Nexium 40 Mg Cpdr (Esomeprazole magnesium) .Marland Kitchen... Take 1 tablet by mouth once a day Her updated medication list for this problem includes:    Levsin 0.125 Mg Tabs (Hyoscyamine sulfate) .Marland Kitchen... Take 1 tab by mouth q6h as needed for abd cramping...  Problem # 8:  PERIPHERAL NEUROPATHY (ICD-356.9) We discussed referral to Neurology for their opinion & Rx...  Problem # 9:  OTHER MEDICAL PROBLEMS AS NOTED>>> OK 2010 Flu vaccine today...  Complete Medication List: 1)  Coumadin 2 Mg Tabs (Warfarin  sodium) .... Take as directed by drweintraub.Marland KitchenMarland Kitchen 2)  Atenolol 50 Mg Tabs (Atenolol) .Marland Kitchen.. 1 by mouth once daily 3)  Diovan Hct 160-12.5 Mg Tabs (Valsartan-hydrochlorothiazide) .... Take 1 tab by mouth once daily.Marland KitchenMarland Kitchen 4)  Multaq 400 Mg Tabs (Dronedarone hcl) .... Take 1 tab by mouth two times a day... 5)  Pravastatin Sodium 40 Mg Tabs (Pravastatin sodium) .... Take 1 tablet by mouth once a day 6)  Levothroid 125 Mcg Tabs (Levothyroxine sodium) .... Take 1 tablet by mouth once a day 7)  Levsin 0.125 Mg Tabs (Hyoscyamine sulfate) .... Take 1 tab by mouth q6h as needed for abd cramping... 8)  Imodium A-d 2 Mg Tabs (Loperamide hcl) .... Take 1-2 tabs by mouth as needed for diarrhea... 9)  Gabapentin 300 Mg Caps (Gabapentin) .... Take 1 tablet by mouth once a day 10)   Tramadol Hcl 50 Mg Tabs (Tramadol hcl) .... Take 1 tab by mouth every 6-8h as needed for pain (may take w/ tylenol) 11)  Caltrate 600+d Plus 600-400 Mg-unit Tabs (Calcium carbonate-vit d-min) .... Take 1 tab by mouth once daily... 12)  Womens Multivitamin Plus Tabs (Multiple vitamins-minerals) .... Take 1 tab by mouth once daily... 13)  Vitamin D 1000 Unit Tabs (Cholecalciferol) .... Take 1 cap by mouth once daily...  Other Orders: Prescription Created Electronically [phone]) Neurology Referral (Neuro) Flu Vaccine 51yrs + [phone]) Administration Flu vaccine - MCR [id number])  Patient Instructions: 1)  Today we updated your med list- see below.... 2)  We wrote new perscriptions for 2011... We gave you the 2010 Flu vaccine today as well... 3)  We also did your follow up CXR & FASTING blood work... please call the "phone tree" in a few days for your lab results.Marland KitchenMarland Kitchen 4)  For your Side-Pain:  NO HEAVY LIFTING... apply heating pad, icey hott etc... consider getting a rib binder to support your chest well... take the TRAMADOL up to 3/d with extra strength Tylenol... 5)  For the Neuropathy:  we will arrange for an appt w/ the Neurologists for further eval & Rx... 6)  Call for any problems.Marland KitchenMarland Kitchen 7)  Please schedule a follow-up appointment in 6 months. Prescriptions: TRAMADOL HCL 50 MG TABS (TRAMADOL HCL) take 1 tab by mouth every 6-8H as needed for pain (may take w/ Tylenol)  #100 x prn   Entered and Authorized by:   Noralee Space MD   Signed by:   Noralee Space MD on 10/17/2009   Method used:   Print then Give to Patient   RxID:   [id number] PRAVASTATIN SODIUM 40 MG TABS (PRAVASTATIN SODIUM) Take 1 tablet by mouth once a day  #90 x prn   Entered and Authorized by:   Noralee Space MD   Signed by:   Noralee Space MD on 10/17/2009   Method used:   Print then Give to Patient   RxID:   [id number] GABAPENTIN 300 MG  CAPS (GABAPENTIN) Take 1 tablet by mouth once a day  #90 x prn   Entered and  Authorized by:   Noralee Space MD   Signed by:   Noralee Space MD on 10/17/2009   Method used:   Print then Give to Patient   [id number] LEVOTHROID 125 MCG  TABS (LEVOTHYROXINE SODIUM) Take 1 tablet by mouth once a day  #90 x prn   Entered and Authorized by:   Noralee Space MD   Signed by:   Noralee Space MD on  10/17/2009   Method used:   Print then Give to Patient   RxID:   [id number] DIOVAN HCT 160-12.5 MG  TABS (VALSARTAN-HYDROCHLOROTHIAZIDE) take 1 tab by mouth once daily...  #90 x prn   Entered and Authorized by:   Noralee Space MD   Signed by:   Noralee Space MD on 10/17/2009   Method used:   Print then Give to Patient   RxID:   [id number] ATENOLOL 50 MG  TABS (ATENOLOL) 1 by mouth once daily  #90 x prn   Entered and Authorized by:   Noralee Space MD   Signed by:   Noralee Space MD on 10/17/2009   Method used:   Print then Give to Patient   RxID:   [id number]      Flu Vaccine Consent Questions     Do you have a history of severe allergic reactions to this vaccine? no    Any prior history of allergic reactions to egg and/or gelatin? no    Do you have a sensitivity to the preservative Thimersol? no    Do you have a past history of Guillan-Barre Syndrome? no    Do you currently have an acute febrile illness? no    Have you ever had a severe reaction to latex? no    Vaccine information given and explained to patient? yes    Are you currently pregnant? no    Lot Number:[id number]   Exp Date:04/12/2010   Site Given  Left Deltoid IMflu   Elita Boone CMA  October 17, 2009 10:27 AM

## 2010-11-13 NOTE — Procedures (Signed)
Summary: Colonoscopy:    Colonoscopy  Procedure date:  02/05/2007  Findings:      Results: Polyp.  Results: Diverticulosis. Location:  [facility].   Comments: 1) 12 mm TRANSVERSE COLON POLYP REMOVED 2) MILD SIGMOID DIVERTICULOSIS 3) OTHERWISE NORMAL 4) EXCELLENT PREP 5) STRONG IBS RESPONSE TO SCOPE  ***MICROSCOPIC EXAMINATION AND DIAGNOSIS***    1. COLON, POLYP(S), TRANSVERSE: HYPERPLASTIC POLYP(S). NO ADENOMATOUS CHANGE OR MALIGNANCY IDENTIFIED.    2. COLON, BIOPSY, RANDOM: BENIGN COLONIC MUCOSA. NO SIGNIFICANT INFLAMMATION OR OTHER ABNORMALITIES IDENTIFIED.  Comments:      No Recall  Patient Name: Jill, Washington MRN:  Procedure Procedures: Colonoscopy CPT: [phone].    with biopsy. CPT: [id number].    with polypectomy. CPT: X5071110.  Personnel: Endoscopist: Gatha Mayer, MD, [facility].  Referred By: Deborra Medina. Lenna Gilford, MD.  Exam Location: Exam performed in Outpatient Clinic. Outpatient  Patient Consent: Procedure, Alternatives, Risks and Benefits discussed, consent obtained, from patient. Consent was obtained by the RN.  Indications Symptoms: Diarrhea  History  Current Medications: Patient is currently taking Coumadin. AC Plan: Stop Coumadin, no blood work.  Allergies: No known allergies.  Comments: Episodic diarrhea problems since Oct 2007. Has had incontinence. Pre-Exam Physical: Performed Feb 05, 2007. Cardio-pulmonary exam, Rectal exam, HEENT exam  WNL. Abdominal exam abnormal. Mental status exam WNL. Abnormal PE findings include: mildly tender RLQ.  Comments: Pt. history reviewed/updated, physical exam performed prior to initiation of sedation? YES Exam Exam: Extent of exam reached: Terminal Ileum, extent intended: Terminal Ileum.  The cecum was identified by appendiceal orifice and IC valve. Patient position: on left side. Time to Cecum: 00:07:33. Time for Withdrawl: 00:09:38. Colon retroflexion performed. Images taken.  ASA Classification: III. Tolerance: fair, adequate exam.  Monitoring: Pulse and BP monitoring, Oximetry used. Supplemental O2 given.  Colon Prep Used MoviPrep for colon prep. Prep results: excellent.  Sedation Meds: Patient assessed and found to be appropriate for moderate (conscious) sedation. Fentanyl 50 mcg. given IV. Versed 4 mg. given IV.  Findings POLYP: Transverse Colon, Maximum size: 12 mm. sessile polyp. Procedure:  snare with cautery, The polyp was removed piece meal. removed, retrieved, Polyp sent to pathology. ICD9: Neoplasia, Benign, Large Bowel: 211.3.  - NORMAL EXAM: Transverse Colon to Descending Colon.  NORMAL EXAM: Terminal Ileum. Comments: very distal terminal ileum.  - NORMAL EXAM: Cecum to Hepatic Flexure. Biopsy/Normal Exam taken.  - DIVERTICULOSIS: Sigmoid Colon. ICD9: Diverticulosis, Colon: 562.10.  NORMAL EXAM: Rectum.   Assessment  Diagnoses: 211.3: Neoplasia, Benign, Large Bowel.  562.10: Diverticulosis, Colon.   Comments: 1) 12 mm TRANSVERSE COLON POLYP REMOVED 2) MILD SIGMOID DIVERTICULOSIS 3) OTHERWISE NORMAL 4) EXCELLENT PREP 5) STRONG IBS RESPONSE TO SCOPE Events  Unplanned Interventions: No intervention was required.  Plans Comments: RESTART COUMADIN TOMORROW NIGHT AND GET PT/INR CHECKED IN 1 WEEK USE IMODIUM AD AS NEEDED FOR DIARRHEA Disposition: After procedure patient sent to recovery. After recovery patient sent home.  Scheduling/Referral: Telephone Contact, to The Patient, RE: RESULTS AND PLANS,   CC:   Teressa Lower, MD   Terance Ice, MD  This report was created from the original endoscopy report, which was reviewed and signed by the above listed endoscopist.

## 2010-11-13 NOTE — Letter (Signed)
Summary: [facility]  [facility]   Imported By: Bubba Hales 12/22/2009 09:19:32  _____________________________________________________________________  External Attachment:    Type:   Image     Comment:   External Document

## 2010-11-13 NOTE — Progress Notes (Signed)
Summary: needs hospital follow up  Phone Note Call from Patient Call back at Home Phone [phone]   Caller: Patient Call For: Jill Washington Summary of Call: Pt states was @ [facility] x 5 days, and was d/c Friday. Pt was advised to follow up with SN in 7 to 10 for right leg infection. Iran Planas CMA  September 17, 2010 9:51 AM   Follow-up for Phone Call        Pls advise if SN can see this pt in 7-10 days for HFU, thanks! Tilden Dome  September 17, 2010 10:27 AM   ok to schedule appt on 12-12 at 3 pm for hfu.  thanks Julien Girt CMA  September 17, 2010 10:32 AM   Additional Follow-up for Phone Call Additional follow up Details #1::        Spoke with pt and sched appt for 09/24/10 at 3 pm. Additional Follow-up by: Tilden Dome,  September 17, 2010 10:39 AM

## 2010-11-13 NOTE — Letter (Signed)
Summary: [facility] By: Rise Patience 06/28/2010 15:29:54  _____________________________________________________________________  External Attachment:    Type:   Image     Comment:   External Document

## 2010-11-13 NOTE — Procedures (Signed)
Summary: EGD: Hiatal Hernia   EGD  Procedure date:  02/05/2005  Findings:      Findings: Hiatal Hernia Location: [facility]  Comments: SMALL HIATAL HERNIA  Patient Name: Jill Washington, Jill Washington MRN:  Procedure Procedures: Panendoscopy (EGD) CPT: [id number].  Personnel: Endoscopist: Gatha Mayer, MD, [facility].  Referred By: Deborra Medina. Lenna Gilford, MD.  Exam Location: Exam performed in Outpatient Clinic. Outpatient  Patient Consent: Procedure, Alternatives, Risks and Benefits discussed, consent obtained, from patient. Consent was obtained by the RN.  Indications Symptoms: Nausea. Abdominal pain, location: RUQ.  History  Current Medications: Patient is not currently taking Coumadin.  Comments: RUQ AND EPIGASTRIC PAIN WITH NAUSEA ON NEXIUM AND IMPROVES WITH PEPTO BISMOL. SHE HAS A 1.7 CM SOLITARY GALLSTONE. Pre-Exam Physical: Performed Feb 05, 2005  Cardio-pulmonary exam, HEENT exam, Mental status exam WNL.  Exam Exam Info: Maximum depth of insertion Duodenum, intended Duodenum. Patient position: on left side. Gastric retroflexion performed. Images taken. ASA Classification: II. Tolerance: excellent.  Sedation Meds: Patient assessed and found to be appropriate for moderate (conscious) sedation. Fentanyl 50 mcg. given IV. Versed 5 mg. given IV. Cetacaine Spray 2 sprays given aerosolized.  Monitoring: BP and pulse monitoring done. Oximetry used. Supplemental O2 given  Findings - Normal: Proximal Esophagus to Distal Esophagus.  HIATAL HERNIA: Diaphragm 42 cm from mouth. Z-line/GE Junction 40 cm from mouth. ICD9: Hernia, Hiatal: 553.3. - Normal: Body to Duodenal 2nd Portion.   Assessment Abnormal examination, see findings above.  Diagnoses: 553.3: Hernia, Hiatal.   Comments: SMALL HIATAL HERNIA Events  Unplanned Intervention: No unplanned interventions were required.  Plans Medication(s): Continue current medications.  Disposition: After procedure patient  sent to recovery. After recovery patient sent home.  Scheduling: Colonoscopy, to Gatha Mayer, MD, [facility], NEXT   Comments: WILL SET UP SURGERY CONSULTATION FOR CHOLELITHIASIS. SYMPTOMS NOT CLASSIC BUT NO CAUSE SEEN HERE. MAY BENEFIT FROM CHOLECYSTECTOMY.  CC:   Teressa Lower, MD  This report was created from the original endoscopy report, which was reviewed and signed by the above listed endoscopist.

## 2010-11-15 NOTE — Assessment & Plan Note (Signed)
Summary: Jill Washington per [place]//lmr   CC:  Post hosp ROV & review....  History of Present Illness: 77 y/o WF here for a follow up visit... she has multiple medical problems as noted below... Followed for general medical purposes w/ hx of Empyema & critical illness in 1982 from which she made a nice recovery, HBP, MVP, AFib & SSS w/ pacer, Hyperchol, Hypothy, HH/ IBS/ Colon Polyps, Neuropathy/ FM... she is also followed by [name] for Cards, [name] for GI, & [name] for Neuro...   ~  October 17, 2009:  55mo ROV- incr stress> sold home, moved in w/ daugh, etc.. c/o some acute right post CWP from lifting wood for their wood stoves... sore tender- Rx w/ rest, heat, Tramadol + Tylenol... she sees [name] every 35mo- changed Amio to Multaq, Pacer OK, overall stable... she does c/o progresssive neuropathy symptoms in her legs- [name] tried change to Cymbalta but she reacted w/ trembling, weakness, confusion; therefore re-started her Gabapentin... we discussed referral to Neurology for Neuropathy f/u... she has been on PRAV40qhs & VitD 50K weekly for >29yr- check fasting labs.   ~  April 19, 2010:  states she sold house & now moving into trailer... c/o "stomach" problems w/ pain, gas, diarrhea> taking gas meds and immodium for her IBS & we discussed LEVSIN for the cramps & getting back on the [place] daily... she would like to f/u w/ [name] & we will help her to set this up... BP & cardiac stable on meds;  she remains on Coumadin via [facility] office;  Chol & Thyroid stable on meds;  she had Neuro f/u [name]> NCV w/ severe neuropathy- on Neurontin & Tramadol...   ~  September 24, 2010:  71mo ROV & post hosp check> adm 11/11 by [facility] right leg cellulitis, failed outpt Keflex given by [facility], treated w/ Unasyn then Augmentin & improved (cult neg), VenDopplers neg for DVT... redness decr, some pain but better, more mobile, etc... had assoc mild RI w/ Creat= 1.09 --> 1.57, and mild anemia w/ Hg= 10.5 --> 11.4 (Fe=54,  Ferritin=142, B12=237, Folate>20)... disch on same meds as before + Tramadol for pain... we discussed adding oral B12  ~577mcg/d & we will follow up.   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  ~  CXR 1/11 showed mild cardiomeg, right pacemaker, mild basilar scarring, NAD.  HYPERTENSION (ICD-401.9) - on ATENOLOL 50mg /d &  DIOVAN/Hct 160-12.5 daily... BP today = 122/70, taking meds regularly, tolerating meds well... she denies HA, fatigue, visual changes, CP, palipit, dizziness, syncope, dyspnea, edema, etc... she will monitor BP at home for [place]...  MITRAL VALVE PROLAPSE (ICD-424.0) - no complaints of CP or palpit... **cath in 1998 showed norm coronaries + MVP...   ~  NuclearStressTest 8/05 showed inferior wall artifact, no ischemia, EF= 69%...  ~  2DEcho 4/06 by [facility] showed mild intermittent prolapse of the ant leaflet of the MV, norm LV size w/ mod ASH...  ~  f/u Myoview 11/09 showed norm perfusion w/o infarct, w/o ischemia.  ~  repeat 2DEcho 6/10 showed norm LVF, norm valves, DD.  ~  repeat 2DEcho 10/11 showed mild conc LVH, norm LVF w/ EF>55%  ~  repeat Myoview 11/11 showed no wall motion abn, no ischemia, no changes.  Hx of ATRIAL FIBRILLATION (ICD-427.31) & SICK SINUS SYNDROME (ICD-427.81) - on COUMADIN & MULTAQ 400mg Bid, followed by  [place]... pacemaker placed by [name] in 1998 (SS[facility]) and followed by him... Generator changed 10/06 and Pacer checked periodically- doing well...   ~  11/10:  [name] changed Amio to [place] 400mg Bid...  VENOUS INSUFFICIENCY (ICD-459.81) - she follows a low sodium diet, elevates legs, support hose when able...  ~  11/11:  Adm w/ right leg cellultis, VenDopplers neg for DVT, resolved w/ Unasyn --> Augmentin.  HYPERCHOLESTEROLEMIA (ICD-272.0) - on PRAVASTATIN 40mg Qhs + diet & exercise...  ~  [place]  9/09 on diet alone showed TChol 268, TG 220, HDL 42, LDL 149... Prav40 started.  ~  [place] 1/11 on Prav40 showed TChol 152, TG 109, HDL 48, LDL 82... keep same.  HYPOTHYROIDISM (ICD-244.9) - on LEVOTHYROID 145mcg/d... she is due for f/u labs...  ~  last labs 10/08 by [name] showed TSH= 6.31... dose incr to 125 at that time...  ~  labs 9/09 on Levo125 showed TSH= 5.09  ~  labs 1/11 on Levo125 showed TSH= 4.59  HIATAL HERNIA (ICD-553.3) - on NEXIUM 40mg  Prn & she is asked to take this daily... last EGD 4/06 showed HH, otherw neg...  POLYP, COLON (ICD-211.3) - last colonoscopy 4/08 by [name] showed divertics, 64mm polyp removed= hyperplastic...  and some spasm- "IBS response" to scope...  she has LEVSIN 0.125mg  Prn for cramping, and IMMODIUM Prn for diarrhea.  FIBROMYALGIA (ICD-729.1) - she uses TRAMADOL 50mg  Prn & METHOCARBAMOL Prn for musc spasm.  VITAMIN D DEFICIENCY (ICD-268.9) - prev on Vit D 50K weekly & switched to 1000 u OTC daily...  ~  labs 9/09 showed Vit D level = 16... start Vit D 50.  ~  labs 1/11 on VitD 50K showed Vit D level = 34... OK switch to 1000 u daily.  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  ~  11/10:  [name] tried switch Neurontin to Cymbalta for neuropathy symptoms- she reacted to the Cymbalta w/ trembling, weakness, confusion & changed back to GABAPENTIN 300mg  Qd... we discussed Neuro appt>>  ~  2/11:  Neuro eval [name] w/ EMG/ NCV showing severe length dependent axonal sensorimotor polyneuropathy.  BORDERLINE B12 LEVELS - rec to take Vit B12 orally  ~525mcg/d...  ~  labs 1/11 showed B12= 267 (211-911)  ~  labs 11/11 in hosp showed B12= 237 (she wasn't taking any B12 supplement) MILD ANEMIA - Labs have been WNL thru office, but mild anemia found 11/11 hosp for cellulitis... **on Coumadin per [facility].  ~  labs 11/11 in hosp showed Hg= 11.0 --> 10.5,  Fe=54 (22%sat), Ferritin=142  SKIN CANCER, HX OF (ICD-V10.83) - followed by Derm... 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  Health Maintenance - GYN= DrRKaplan but she understood him to say she didn't need f/u... had Mammogram & BMD in ?2006 but she's not sure...  takes Calcium, MVI, Vit D...   Preventive Screening-Counseling & Management  Alcohol-Tobacco     Smoking Status: quit     Year Quit: 1957  Allergies (verified): No Known Drug Allergies  Comments:  Nurse/Medical Assistant: The patient's medications and allergies were reviewed with the patient and were updated in the Medication and Allergy Lists.  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 Mother died age 50 from pancreatic cancer Father died age 59---from gall bladder disease 1 sibling alive age 36 w/ diabetes 1 sibling alive age 70 w/ arthritis 1 sibling deceased age 28 from kidney failure 1 sibling deceased age 77 from heart attack  Social History: Reviewed history from 04/19/2010 and no changes required. quit smoking in 1957 not exercising no caffeine use no alcohol widowed 3 children  Review of Systems      See HPI       The patient complains of decreased hearing, dyspnea on  exertion, peripheral edema, muscle weakness, and difficulty walking.  The patient denies anorexia, fever, weight loss, weight gain, vision loss, hoarseness, chest pain, syncope, prolonged cough, headaches, hemoptysis, abdominal pain, melena, hematochezia, severe indigestion/heartburn, hematuria, incontinence, suspicious skin lesions, transient blindness, depression, unusual weight change, abnormal bleeding, enlarged lymph nodes, and angioedema.    Vital Signs:  Patient profile:   77 year old female Height:      66 inches Weight:      187.25 pounds BMI:     30.33 O2 Sat:      96 % on Room air Temp:     97.5 degrees F oral Pulse rate:   70 / minute BP sitting:   122 / 70  (right arm) Cuff size:   regular  Vitals Entered By: Elita Boone CMA (September 24, 2010 2:56 PM)  O2 Sat at Rest %:  96 O2 Flow:  Room air CC: Post hosp ROV & review... Is Patient Diabetic? No Pain Assessment Patient in pain? no      Comments no changes in meds today with pt   Physical Exam  Additional Exam:  WD, WN, 77 y/o WF in NAD... GENERAL:  Alert & oriented; pleasant & cooperative... HEENT:  [place]/AT, EOM-wnl, PERRLA, EACs-clear, TMs-wnl, NOSE-clear, THROAT-clear & wnl. NECK:  Supple w/ fairROM; no JVD; normal carotid impulses w/o bruits; no thyromegaly or nodules palpated; no lymphadenopathy. CHEST:  Clear to P & A; without wheezes/ rales/ or rhonchi heard... HEART:  Regular Rhythm; pacer, gr 1-2/6 SEM S4 no rubs or S3... ABDOMEN:  Soft & nontender; normal bowel sounds; no organomegaly or masses detected. EXT: without deformities, mod arthritic changes; no varicose veins/ +venous insuffic/ 1+edema. NEURO:  CN's intact;  she has a severe peripheral neuropathy w/ decr sensation in LEs, gait abn, decr DTRs. DERM:  dry skin & port-wine birthmark on left chest...    MISC. Report  Procedure date:  09/24/2010  Findings:      DATA REVIEWED:  ~  [facility] records 11/27 - 09/13/10 w/ H&P, DCSummary, Labs, Xrays   ~ Office note from [name] 06/07/10 w/ 2DEcho & Myoview...   Impression & Recommendations:  Problem # 1:  CELLULITIS, LEG, RIGHT (ICD-682.6) I reviewed hosp records w/ pt>  improved w/ the augmentin...  REC> finish antibiotic, continue other meds, keep legs up, support hose if poss, etc...  Problem # 2:  HYPERTENSION (ICD-401.9) Controlled>  same meds. Her updated medication list for this problem includes:    Atenolol 50 Mg Tabs (Atenolol) .Marland Kitchen... 1 by mouth once daily    Diovan Hct 160-12.5 Mg Tabs (Valsartan-hydrochlorothiazide) .Marland Kitchen... Take 1 tab by mouth once daily...  Problem # 3:  Hx of  ATRIAL FIBRILLATION (ICD-427.31) Cardiac per [name], [facility]>  on Coumadin via their clinic, notes reviewed etc... Her updated medication list for this problem includes:    Coumadin 2 Mg Tabs (Warfarin sodium) .Marland Kitchen... Take as directed by drweintraub...    Atenolol 50 Mg Tabs (Atenolol) .Marland Kitchen... 1 by mouth once daily    Multaq 400 Mg Tabs (Dronedarone hcl) .Marland Kitchen... Take 1 tab by mouth two times a day...  Problem # 4:  HYPERCHOLESTEROLEMIA (ICD-272.0) Continue Prav40... Her updated medication list for this problem includes:    Pravastatin Sodium 40 Mg Tabs (Pravastatin sodium) .Marland Kitchen... Take 1 tablet by mouth once a day  Problem # 5:  HYPOTHYROIDISM (ICD-244.9) Continue [id number]... Her updated medication list for this problem includes:    Levothroid 125 Mcg Tabs (Levothyroxine sodium) .Marland Kitchen... Take 1 tablet by mouth once a day  Problem # 6:  PERIPHERAL NEUROPATHY (ICD-356.9) Eval by [name]... severe neuropathy that dates back to 1982 w/ Critical illness polyneuropathy at that time... continue Neurontin.  Problem # 7:  ANEMIA>>> Mild anemia post hosp for her cellulitis & we will follow up...  Complete Medication List: 1)  Coumadin 2 Mg Tabs (Warfarin sodium) .... Take as directed by drweintraub.Marland KitchenMarland Kitchen 2)  Atenolol 50 Mg Tabs (Atenolol) .Marland Kitchen.. 1 by mouth once daily 3)  Diovan Hct 160-12.5 Mg Tabs  (Valsartan-hydrochlorothiazide) .... Take 1 tab by mouth once daily.Marland KitchenMarland Kitchen 4)  Multaq 400 Mg Tabs (Dronedarone hcl) .... Take 1 tab by mouth two times a day... 5)  Pravastatin Sodium 40 Mg Tabs (Pravastatin sodium) .... Take 1 tablet by mouth once a day 6)  Levothroid 125 Mcg Tabs (Levothyroxine sodium) .... Take 1 tablet by mouth once a day 7)  Nexium 40 Mg Cpdr (Esomeprazole magnesium) .... Take 1 cap by mouth 30 min before the 1st meal of the day... 8)  Hyoscyamine Sulfate 0.125 Mg Tabs (Hyoscyamine sulfate) .... Take 1 tab by mouth every 6h as needed for abd cramping... 9)  Imodium A-d 2 Mg Tabs (Loperamide hcl) .... Take 1-2 tabs by mouth as needed for diarrhea... 10)  Gabapentin 300 Mg Caps (Gabapentin) .... Take 1 tablet by mouth once a day 11)  Tramadol Hcl 50 Mg Tabs (Tramadol hcl) .... Take 1 tab by mouth every 6-8h as needed for pain (may take w/ tylenol) 12)  Womens Multivitamin Plus Tabs (Multiple vitamins-minerals) .... Take 1 tab by mouth once daily... 13)  Vitamin B-12 500 Mcg Tabs (Cyanocobalamin) .... Take 1 tab by mouth once daily...  Patient Instructions: 1)  Today we updated your med list- see below.... 2)  Continue your current meds the same... add Vit B 12 554mcg/d OTC as we discussed... 3)  We discussed continued therapy for your leg> elevation, warm soaks, etc... 4)  Call for any problems.Marland KitchenMarland Kitchen 5)  Please schedule a follow-up appointment in 3-4 months with fasting blood work at that time...   Immunization History:  Influenza Immunization History:    Influenza:  historical (08/27/2010)  Pneumovax Immunization History:    Pneumovax:  historical (09/30/2007)

## 2010-11-21 NOTE — Letter (Signed)
Summary: [facility]   Imported By: Bubba Hales 11/13/2010 10:33:38  _____________________________________________________________________  External Attachment:    Type:   Image     Comment:   External Document

## 2010-11-30 ENCOUNTER — Telehealth (INDEPENDENT_AMBULATORY_CARE_PROVIDER_SITE_OTHER): Payer: Self-pay | Admitting: *Deleted

## 2010-12-05 NOTE — Progress Notes (Signed)
Summary: tramadol rx  Phone Note Call from Patient Call back at Home Phone [phone]   Caller: Patient Call For: nadel Summary of Call: pt needs a refill of tramadol. says pharmacy told her to call here to request this. [facility] [phone].  Initial call taken by: Cooper Render, CNA,  November 30, 2010 9:39 AM  Follow-up for Phone Call        pt aware med called  in to the pharmacy Follow-up by: [place],  November 30, 2010 4:05 PM    Prescriptions: TRAMADOL HCL 50 MG TABS (TRAMADOL HCL) take 1 tab by mouth every 6-8H as needed for pain (may take w/ Tylenol)  #100 x 3   Entered by:   Maryann Conners CMA   Authorized by:   Noralee Space MD   Signed by:   Maryann Conners CMA on 11/30/2010   Method used:   Telephoned to ...       [facility] (retail)       [id number]       [place], [place]  [id number]       Ph: [phone]       Fax: [phone]   RxID:   [id number]

## 2010-12-24 LAB — BASIC METABOLIC PANEL
BUN: 15 mg/dL (ref 6–23)
CO2: 27 mEq/L (ref 19–32)
Calcium: 9.2 mg/dL (ref 8.4–10.5)
Chloride: 105 mEq/L (ref 96–112)
Creatinine, Ser: 1.57 mg/dL — ABNORMAL HIGH (ref 0.4–1.2)
GFR calc Af Amer: 38 mL/min — ABNORMAL LOW (ref >60)
GFR calc non Af Amer: 32 mL/min — ABNORMAL LOW (ref >60)
Glucose, Bld: 92 mg/dL (ref 70–99)
Potassium: 4 mEq/L (ref 3.5–5.1)
Sodium: 138 mEq/L (ref 135–145)

## 2010-12-24 LAB — PROTIME-INR
INR: 2.19 — ABNORMAL HIGH (ref 0.00–1.49)
Prothrombin Time: 24.5 seconds — ABNORMAL HIGH (ref 11.6–15.2)

## 2010-12-25 LAB — CBC
HCT: 31.2 % — ABNORMAL LOW (ref 36.0–46.0)
HCT: 31.7 % — ABNORMAL LOW (ref 36.0–46.0)
HCT: 32.2 % — ABNORMAL LOW (ref 36.0–46.0)
HCT: 33.5 % — ABNORMAL LOW (ref 36.0–46.0)
Hemoglobin: 10.8 g/dL — ABNORMAL LOW (ref 12.0–15.0)
Hemoglobin: 11 g/dL — ABNORMAL LOW (ref 12.0–15.0)
Hemoglobin: 11.4 g/dL — ABNORMAL LOW (ref 12.0–15.0)
MCH: 29.7 pg (ref 26.0–34.0)
MCH: 30.1 pg (ref 26.0–34.0)
MCH: 30.1 pg (ref 26.0–34.0)
MCHC: 34 g/dL (ref 30.0–36.0)
MCHC: 34.2 g/dL (ref 30.0–36.0)
MCV: 88.2 fL (ref 78.0–100.0)
MCV: 88.3 fL (ref 78.0–100.0)
MCV: 88.4 fL (ref 78.0–100.0)
Platelets: 297 10*3/uL (ref 150–400)
RBC: 3.53 MIL/uL — ABNORMAL LOW (ref 3.87–5.11)
RBC: 3.59 MIL/uL — ABNORMAL LOW (ref 3.87–5.11)
RBC: 3.65 MIL/uL — ABNORMAL LOW (ref 3.87–5.11)
RDW: 11.8 % (ref 11.5–15.5)
RDW: 11.8 % (ref 11.5–15.5)
WBC: 5.8 10*3/uL (ref 4.0–10.5)
WBC: 6.9 10*3/uL (ref 4.0–10.5)

## 2010-12-25 LAB — CULTURE, BLOOD (ROUTINE X 2): Culture: NO GROWTH

## 2010-12-25 LAB — FERRITIN: Ferritin: 142 ng/mL (ref 10–291)

## 2010-12-25 LAB — DIFFERENTIAL
Basophils Relative: 0 % (ref 0–1)
Eosinophils Relative: 4 % (ref 0–5)
Monocytes Absolute: 0.6 10*3/uL (ref 0.1–1.0)
Monocytes Relative: 9 % (ref 3–12)
Neutro Abs: 4.7 10*3/uL (ref 1.7–7.7)

## 2010-12-25 LAB — COMPREHENSIVE METABOLIC PANEL
Alkaline Phosphatase: 51 U/L (ref 39–117)
BUN: 12 mg/dL (ref 6–23)
Chloride: 107 mEq/L (ref 96–112)
Glucose, Bld: 101 mg/dL — ABNORMAL HIGH (ref 70–99)
Potassium: 4 mEq/L (ref 3.5–5.1)
Total Bilirubin: 0.5 mg/dL (ref 0.3–1.2)

## 2010-12-25 LAB — IRON AND TIBC: UIBC: 192 ug/dL

## 2010-12-25 LAB — BASIC METABOLIC PANEL
BUN: 12 mg/dL (ref 6–23)
CO2: 24 mEq/L (ref 19–32)
Calcium: 9.2 mg/dL (ref 8.4–10.5)
Chloride: 106 mEq/L (ref 96–112)
Creatinine, Ser: 1.09 mg/dL (ref 0.4–1.2)
GFR calc Af Amer: 58 mL/min — ABNORMAL LOW (ref >60)
GFR calc non Af Amer: 48 mL/min — ABNORMAL LOW (ref >60)
Glucose, Bld: 92 mg/dL (ref 70–99)
Potassium: 3.8 mEq/L (ref 3.5–5.1)
Sodium: 139 mEq/L (ref 135–145)

## 2010-12-25 LAB — TSH
TSH: 4.283 u[IU]/mL (ref 0.350–4.500)
TSH: 4.953 u[IU]/mL — ABNORMAL HIGH (ref 0.350–4.500)

## 2010-12-25 LAB — PROTIME-INR
INR: 2.11 — ABNORMAL HIGH (ref 0.00–1.49)
INR: 2.31 — ABNORMAL HIGH (ref 0.00–1.49)
Prothrombin Time: 25.5 seconds — ABNORMAL HIGH (ref 11.6–15.2)

## 2010-12-25 LAB — BRAIN NATRIURETIC PEPTIDE: Pro B Natriuretic peptide (BNP): 369 pg/mL — ABNORMAL HIGH (ref 0.0–100.0)

## 2011-01-22 ENCOUNTER — Other Ambulatory Visit (INDEPENDENT_AMBULATORY_CARE_PROVIDER_SITE_OTHER): Payer: Medicare Other

## 2011-01-22 ENCOUNTER — Ambulatory Visit (INDEPENDENT_AMBULATORY_CARE_PROVIDER_SITE_OTHER): Payer: Medicare Other | Admitting: Pulmonary Disease

## 2011-01-22 ENCOUNTER — Encounter: Payer: Self-pay | Admitting: Pulmonary Disease

## 2011-01-22 DIAGNOSIS — D649 Anemia, unspecified: Secondary | ICD-10-CM

## 2011-01-22 DIAGNOSIS — G609 Hereditary and idiopathic neuropathy, unspecified: Secondary | ICD-10-CM

## 2011-01-22 DIAGNOSIS — E7800 Pure hypercholesterolemia, unspecified: Secondary | ICD-10-CM

## 2011-01-22 DIAGNOSIS — K589 Irritable bowel syndrome without diarrhea: Secondary | ICD-10-CM

## 2011-01-22 DIAGNOSIS — E039 Hypothyroidism, unspecified: Secondary | ICD-10-CM

## 2011-01-22 DIAGNOSIS — E538 Deficiency of other specified B group vitamins: Secondary | ICD-10-CM

## 2011-01-22 DIAGNOSIS — I495 Sick sinus syndrome: Secondary | ICD-10-CM

## 2011-01-22 DIAGNOSIS — I10 Essential (primary) hypertension: Secondary | ICD-10-CM

## 2011-01-22 DIAGNOSIS — I4891 Unspecified atrial fibrillation: Secondary | ICD-10-CM

## 2011-01-22 LAB — CBC WITH DIFFERENTIAL/PLATELET
Basophils Absolute: 0 10*3/uL (ref 0.0–0.1)
Eosinophils Absolute: 0.1 10*3/uL (ref 0.0–0.7)
HCT: 37.1 % (ref 36.0–46.0)
Hemoglobin: 12.9 g/dL (ref 12.0–15.0)
Lymphs Abs: 1.4 10*3/uL (ref 0.7–4.0)
MCHC: 34.7 g/dL (ref 30.0–36.0)
MCV: 89.6 fl (ref 78.0–100.0)
Monocytes Relative: 6.3 % (ref 3.0–12.0)
Neutro Abs: 5.4 10*3/uL (ref 1.4–7.7)
RDW: 12.5 % (ref 11.5–14.6)

## 2011-01-22 LAB — HEPATIC FUNCTION PANEL
Alkaline Phosphatase: 61 U/L (ref 39–117)
Bilirubin, Direct: 0.2 mg/dL (ref 0.0–0.3)
Total Bilirubin: 1 mg/dL (ref 0.3–1.2)

## 2011-01-22 LAB — LIPID PANEL
HDL: 36 mg/dL — ABNORMAL LOW (ref >39.00)
Total CHOL/HDL Ratio: 4
VLDL: 36.8 mg/dL (ref 0.0–40.0)

## 2011-01-22 LAB — BASIC METABOLIC PANEL
Calcium: 9.7 mg/dL (ref 8.4–10.5)
GFR: 35.28 mL/min — ABNORMAL LOW (ref >60.00)
Sodium: 145 mEq/L (ref 135–145)

## 2011-01-22 NOTE — Progress Notes (Signed)
Subjective:    Patient ID: Jill Washington, female    DOB: October 28, 1925, 77 y.o.   MRN: [id number]  HPI 77 y/o WF here for a follow up visit... she has multiple medical problems as noted below... Followed for general medical purposes w/ hx of Empyema & critical illness in 1982 from which she made a nice recovery, HBP, MVP, AFib & SSS w/ pacer, Hyperchol, Hypothy, HH/ IBS/ Colon Polyps, Neuropathy/ FM... she is also followed by [name] for Cards, [name] for GI, & [name] for Neuro...  ~  September 24, 2010:  90mo ROV & post hosp check> adm 11/11 by [facility] right leg cellulitis, failed outpt Keflex given by [facility], treated w/ Unasyn then Augmentin & improved (cult neg), VenDopplers neg for DVT... redness decr, some pain but better, more mobile, etc... had assoc mild RI w/ Creat= 1.09 --> 1.57, and mild anemia w/ Hg= 10.5 --> 11.4 (Fe=54, Ferritin=142, B12=237, Folate>20)... disch on same meds as before + Tramadol for pain... we discussed adding oral B12 ~538mcg/d & we will follow up.  ~  January 22, 2011:  29mo ROV & improved overall- still having some IBS issues & states the Levsin didn't help (Immodium helps the diarrhea);  Offered other med to try vs f/u w/ GI for their review- she has seen [name] & will call for an appt;  She saw [name] 1/12- hx SSS, pacer w/ generator ch in 2006, HBP, PAF on Multaq, neg coronary work up, etc> no med changes made;  We reviewed her med regimen & did fasting blood work today> see prob list below:       Problem List:  Hx of EMPYEMA (ICD-510.9) - hx of critical illness in 1982 w/ epiglottitis, sepsis, empyema, resp failure w/ ARDS, critical illness myopathy & polyneuropathy- from which she made a remarkable recovery... ~  CXR 1/11 showed mild cardiomeg, right pacemaker, mild basilar scarring, NAD.  HYPERTENSION (ICD-401.9) - on ATENOLOL 50mg /d &  DIOVAN/Hct 160-12.5 daily... She is supposed to monitor BP at home. ~  4/12:  BP today = 150/80, taking meds regularly,  tolerating meds well... she denies HA, fatigue, visual changes, CP, palipit, dizziness, syncope, dyspnea, edema, etc...  MITRAL VALVE PROLAPSE (ICD-424.0) - no complaints of CP or palpit... cath in 1998 showed norm coronaries + MVP...  ~  NuclearStressTest 8/05 showed inferior wall artifact, no ischemia, EF= 69%... ~  2DEcho 4/06 by [facility] showed mild intermittent prolapse of the ant leaflet of the MV, norm LV size w/ mod ASH... ~  f/u Myoview 11/09 showed norm perfusion w/o infarct, w/o ischemia. ~  repeat 2DEcho 6/10 showed norm LVF, norm valves, DD. ~  repeat 2DEcho 10/11 showed mild conc LVH, norm LVF w/ EF>55% ~  repeat Myoview 11/11 showed no wall motion abn, no ischemia, no changes.  Hx of ATRIAL FIBRILLATION (ICD-427.31) & SICK SINUS SYNDROME (ICD-427.81) - on COUMADIN & MULTAQ 400mg Bid, [facility] by [facility]... pacemaker placed by [name] in 1998 (SSS) and followed by him... Generator changed 10/06 and Pacer checked periodically- doing well...  ~  11/10:  [name] changed Amio to [place] 400mg Bid...  VENOUS INSUFFICIENCY (ICD-459.81) - she follows a low sodium diet, elevates legs, support hose when able... ~  11/11:  Adm w/ right leg cellultis, VenDopplers neg for DVT, resolved w/ Unasyn ==> Augmentin.  HYPERCHOLESTEROLEMIA (ICD-272.0) - on PRAVASTATIN 40mg Qhs + diet & exercise... ~  [place] 9/09 on diet alone showed TChol 268, TG 220, HDL 42, LDL 149... Prav40 started. ~  [place] 1/11 on  Prav40 showed TChol 152, TG 109, HDL 48, LDL 82... keep same. ~  [place] 4/12 on Prav40 showed TChol 142, TG 184, HDL 36, LDL 69  HYPOTHYROIDISM (ICD-244.9) - on LEVOTHYROID 120mcg/d... she is due for f/u labs... ~  last labs 10/08 by [name] showed TSH= 6.31... dose incr to 125 at that time... ~  labs 9/09 on Levo125 showed TSH= 5.09 ~  labs 1/11 on Levo125 showed TSH= 4.59 ~  Labs 4/12 on Levo125 showed TSH= 1.69  HIATAL HERNIA (ICD-553.3) - on NEXIUM 40mg  Prn & she is asked to take this daily... last EGD 4/06 showed  HH, otherw neg...  POLYP, COLON (ICD-211.3) - last colonoscopy 4/08 by [name] showed divertics, 58mm polyp removed= hyperplastic...  and some spasm- "IBS response" to scope... She states that [place] didn't help her cramping but Immodiium does help the diarrhea... She will f/u w/ [name].  FIBROMYALGIA (ICD-729.1) - she uses TRAMADOL 50mg  Prn & METHOCARBAMOL Prn for musc spasm.  VITAMIN D DEFICIENCY (ICD-268.9) - prev on Vit D 50K weekly & switched to 1000 u OTC daily... ~  labs 9/09 showed Vit D level = 16... start Vit D 50. ~  labs 1/11 on VitD 50K showed Vit D level = 34... OK switch to 1000 u daily.  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 ~  11/10:  [name] tried switch Neurontin to Cymbalta for neuropathy symptoms- she reacted to the Cymbalta w/ trembling, weakness, confusion & changed back to GABAPENTIN 300mg  Qd... we discussed Neuro appt > ~  2/11:  Neuro eval [name] w/ EMG/ NCV showing severe length dependent axonal sensorimotor polyneuropathy.  BORDERLINE B12 LEVELS - rec to take Vit B12 orally ~566mcg/d... ~  labs 1/11 showed B12= 267 (211-911) ~  labs 11/11 in hosp showed B12= 237 (she wasn't taking any B12 supplement) & rec to start 547mcg/d. ~  Labs 4/12 showed B12 level = 775, continue oral supplementation...  MILD ANEMIA - Labs have been WNL thru office, but mild anemia found 11/11 hosp for cellulitis... on Coumadin per [facility]. ~  labs 11/11 in hosp showed Hg= 11.0 ==> 10.5, Fe=54 (22%sat), Ferritin=142 ~  Labs 4/12 showed Hg= 12.9 on MVI & B12 supplementation...  SKIN CANCER, HX OF (ICD-V10.83) - followed by Derm... 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  Health Maintenance - GYN= DrRKaplan but she understood him to say she didn't need f/u... had Mammogram & BMD in ?2006 but she's not sure...  takes Calcium, MVI, Vit D...   Past  Surgical History  Procedure Date  . Cervical spine surgery   . Cholecystectomy 03/2005    by Dr. Dalbert Batman  . Total abdominal hysterectomy w/ bilateral salpingoophorectomy     Outpatient Encounter Prescriptions as of 01/22/2011  Medication Sig Dispense Refill  . atenolol (TENORMIN) 50 MG tablet Take 50 mg by mouth daily.        Marland Kitchen dronedarone (MULTAQ) 400 MG tablet Take 400 mg by mouth 2 (two) times daily with a meal.        . esomeprazole (NEXIUM) 40 MG capsule Take 40 mg by mouth daily before breakfast.        . gabapentin (NEURONTIN) 300 MG capsule Take 300 mg by mouth daily.        Marland Kitchen levothyroxine (SYNTHROID, LEVOTHROID) 125 MCG tablet Take 125 mcg by mouth daily.        Marland Kitchen  loperamide (IMODIUM) 2 MG capsule Take 2 mg by mouth 4 (four) times daily as needed.        . Multiple Vitamins-Minerals (QC WOMENS DAILY MULTIVITAMIN PO) Take 1 tablet by mouth daily.        . pravastatin (PRAVACHOL) 40 MG tablet Take 40 mg by mouth daily.        . traMADol (ULTRAM) 50 MG tablet Take 50 mg by mouth every 6 (six) hours as needed. As needed for pain and may take with tylenol       . valsartan-hydrochlorothiazide (DIOVAN-HCT) 160-12.5 MG per tablet Take 1 tablet by mouth daily.        . vitamin B-12 (CYANOCOBALAMIN) 500 MCG tablet Take 500 mcg by mouth daily.        Marland Kitchen warfarin (COUMADIN) 2 MG tablet Take as directed by Dr. Rollene Fare       . hyoscyamine (LEVSIN) 0.125 MG/5ML ELIX Take 125 mcg by mouth every 6 (six) hours as needed. As needed for abdominal cramping         Allergies  Allergen Reactions  . Iodinated Diagnostic Agents   . Other     Dial Soap    Review of Systems        See HPI - all other systems neg except as noted...      The patient complains of decreased hearing, dyspnea on exertion, peripheral edema, muscle weakness, and difficulty walking.  The patient denies anorexia, fever, weight loss, weight gain, vision loss, hoarseness, chest pain, syncope, prolonged cough, headaches,  hemoptysis, abdominal pain, melena, hematochezia, severe indigestion/heartburn, hematuria, incontinence, suspicious skin lesions, transient blindness, depression, unusual weight change, abnormal bleeding, enlarged lymph nodes, and angioedema.     Objective:   Physical Exam      WD, WN, 77 y/o WF in NAD... GENERAL:  Alert & oriented; pleasant & cooperative... HEENT:  [place]/AT, EOM-wnl, PERRLA, EACs-clear, TMs-wnl, NOSE-clear, THROAT-clear & wnl. NECK:  Supple w/ fairROM; no JVD; normal carotid impulses w/o bruits; no thyromegaly or nodules palpated; no lymphadenopathy. CHEST:  Clear to P & A; without wheezes/ rales/ or rhonchi heard... HEART:  Regular Rhythm; pacer, gr 1-2/6 SEM S4 no rubs or S3... ABDOMEN:  Soft & nontender; normal bowel sounds; no organomegaly or masses detected. EXT: without deformities, mod arthritic changes; no varicose veins/ +venous insuffic/ 1+edema. NEURO:  CN's intact;  she has a severe peripheral neuropathy w/ decr sensation in LEs, gait abn, decr DTRs. DERM:  dry skin & port-wine birthmark on left chest...   Assessment & Plan:   PULM>  She remains stable w/o resp exac, etc...  HBP>  Fair control on Aten + Diovan/ HCT, asked to do better job w/ calorie & sodium restriction, get wt down, monitor BP at home (otherw she might need incr med rx in f/u)...  CARDS>  Followed by [name] w/ PAF, Pacer for SSS, etc;  Stable on current regimen w/ Multaq, Coumadin, etc;  Continue same...  CHOL>  Chol OK on the Prav40 but incr TG & we discussed need for better low fat diet, get wt down, etc...  HYPOTHY>  Stable on the Synthroid 185mcg/d, continue same...  Neuropathy>  She hasn't followed up w/ Neuro [name]; discussed slowly inct the Gabapentin dose if nec (currently 300mg  Qhs)...  Anemia/ B12>  Her anemia is improved w/ Hg 12.9, and B12 is now normal on oral supplement.Marland KitchenMarland Kitchen

## 2011-01-22 NOTE — Patient Instructions (Signed)
Today we updated your med list in EPIC...    Continue your current meds the same...  Today we did your follow up fasting blood work...    Please call the PHONE TREE in a few days for your results...    Dial [id number] & when prompted enter your patient number followed by the # symbol...    Your patient number is:  [id number]  Call for any problems... Let's plan a follow up appt in 6 months.Marland KitchenMarland Kitchen

## 2011-01-27 ENCOUNTER — Encounter: Payer: Self-pay | Admitting: Pulmonary Disease

## 2011-02-11 ENCOUNTER — Other Ambulatory Visit: Payer: Self-pay | Admitting: *Deleted

## 2011-02-11 MED ORDER — PRAVASTATIN SODIUM 40 MG PO TABS: 40.0000 mg | ORAL_TABLET | Freq: Every day | ORAL | Status: DC

## 2011-02-11 MED ORDER — ESOMEPRAZOLE MAGNESIUM 40 MG PO CPDR: 40.0000 mg | DELAYED_RELEASE_CAPSULE | Freq: Every day | ORAL | Status: DC

## 2011-02-11 MED ORDER — GABAPENTIN 300 MG PO CAPS: 300.0000 mg | ORAL_CAPSULE | Freq: Every day | ORAL | Status: DC

## 2011-02-11 MED ORDER — LEVOTHYROXINE SODIUM 125 MCG PO TABS: 125.0000 ug | ORAL_TABLET | Freq: Every day | ORAL | Status: DC

## 2011-02-11 MED ORDER — VALSARTAN-HYDROCHLOROTHIAZIDE 160-12.5 MG PO TABS: 1.0000 | ORAL_TABLET | Freq: Every day | ORAL | Status: DC

## 2011-02-14 ENCOUNTER — Telehealth: Payer: Self-pay | Admitting: Pulmonary Disease

## 2011-02-14 MED ORDER — TRAMADOL HCL 50 MG PO TABS: 50.0000 mg | ORAL_TABLET | Freq: Four times a day (QID) | ORAL | Status: DC | PRN

## 2011-02-14 NOTE — Telephone Encounter (Signed)
[facility] states they have not received refill auth for nexium and tramadol. Verbal order given via phone.

## 2011-03-01 NOTE — Op Note (Signed)
NAMEMENDA, LEFFALL             ACCOUNT NO.:  [id number]   MEDICAL RECORD NO.:  [id number]          PATIENT TYPE:  OIB   LOCATION:  2550                         FACILITY:  [facility]   PHYSICIAN:  Edsel Petrin. Dalbert Batman, M.D.DATE OF BIRTH:  1926-03-14   DATE OF PROCEDURE:  03/20/2005  DATE OF DISCHARGE:                                 OPERATIVE REPORT   PREOPERATIVE DIAGNOSIS:  Chronic cholecystitis with cholelithiasis.   POSTOPERATIVE DIAGNOSIS:  Chronic cholecystitis with cholelithiasis.   OPERATION PERFORMED:  Laparoscopic cholecystectomy with intraoperative  cholangiogram.   SURGEON:  Edsel Petrin. Dalbert Batman, M.D.   FIRST ASSISTANT:  Marland Kitchen T. Hoxworth, M.D.   OPERATIVE INDICATIONS:  This is a 77 year old white female who is been  having intermittent episodes of upper abdominal discomfort and nausea for  six to seven months.  She has some fatty food intolerance.  She has numerous  medical problems including hypertension, AV sequential pacemaker,  hypothyroidism, history of empyema of the chest with prolonged ventilatory  failure, history of acute renal failure which is resolved, history of atrial  fibrillation which has resolved, and remote peptic ulcer disease.  It is  felt that she most likely is having biliary colic, and she is been advised  to have elective cholecystectomy.  She is brought to the operating room  electively.   OPERATIVE FINDINGS:  The gallbladder was chronically inflamed and did have  fairly extensive adhesions to the body and infundibulum, suggesting prior  inflammatory episodes.  The cholangiogram showed normal intrahepatic and  extrahepatic bile ducts and anatomy, there was no filling defect, and there  was no obstruction with good flow of contrast into the duodenum.  The  stomach, liver, duodenum, small intestine and large intestine were grossly  normal to inspection.  There some adhesions in the lower midline, presumably  from her previous hysterectomy.   OPERATIVE TECHNIQUE:  Following the induction of general endotracheal  anesthesia, the patient's abdomen was prepped and draped in a sterile  fashion.  Marcaine 0.5% with epinephrine was used as a local infiltration  anesthetic.    A vertically-oriented incision was made at the lower rim of the umbilicus.  The fascia was incised in the midline and the abdominal cavity entered under  direct vision.  A 10 mm Hasson trocar was inserted and secured the  pursestring suture of 0 Vicryl.  Pneumoperitoneum was created.  The video  camera was inserted with visualization and findings as described above.  A  10 mm trocar was placed in the subxiphoid region and two 5 mm trocars placed  in the right midabdomen.  The gallbladder fundus was elevated.  We carefully  took all the adhesions down.  We very carefully dissected out what turned  out to be the anterior branch of the cystic artery, isolated it went onto  the wall of the gallbladder. Controlled it with multiple metal clips and  divided it.  We then isolated the cystic duct and created a window behind  it.  The cystic duct was secured with a metal clip close to the gallbladder.  A cholangiogram catheter was inserted into the cystic  duct.  A cholangiogram  was obtained using the C-arm.  The cholangiogram was normal, showing normal  biliary anatomy, no filling defect, and no obstruction.  The cholangiogram  catheter was removed.  The cystic duct was secured with multiple metal clips  and divided.  We began dissecting the gallbladder out of its bed.  We found  the posterior branch of the artery going up the back wall of the gallbladder  on the right, isolated it, secured it with multiple metal clips and divided.  We then dissected the gallbladder out of its bed, placed it in a specimen  bag and removed it.  We had torn a small hole the gallbladder and had  spilled some clear bile, but no stones had been spilled.  After removal of  the gallbladder, we  spent a great deal of time irrigating the subhepatic  space and subphrenic space and at the completion of the case, the irrigation  fluid was completely clear.  There was absolutely no bleeding and no bile  leak whatsoever.  We evacuated all the irrigation fluid.  The trocars were  removed under direct vision.  There is no bleeding from the trocar sites.  The pneumoperitoneum was released.  The fascia of the umbilicus was closed  with 0 Vicryl sutures.  The skin incisions were closed with subcuticular  sutures of 4-0 Vicryl and Steri-Strips.  Clean bandages were placed and the  patient taken to the recovery room in stable condition.  Estimated blood  loss was about 25 mL. Complications:  None.  Sponge, needle and instrument  counts were correct.       HMI/MEDQ  D:  03/20/2005  T:  03/20/2005  Job:  [id number]   cc:   Gatha Mayer, M.D. [facility] M. Lenna Gilford, M.D. [facility]   Richard A. Rollene Fare, M.D.  [phone] [address] [place] [address]  Fax: [phone]

## 2011-03-01 NOTE — Discharge Summary (Signed)
Jill Washington, Jill Washington             ACCOUNT NO.:  [id number]   MEDICAL RECORD NO.:  [id number]          PATIENT TYPE:  OIB   LOCATION:  53                         FACILITY:  [facility]   PHYSICIAN:  Richard A. Rollene Fare, M.D.DATE OF BIRTH:  [birth year]   DATE OF ADMISSION:  08/02/2005  DATE OF DISCHARGE:  08/03/2005                                 DISCHARGE SUMMARY   ADMISSION DIAGNOSES:  1.  Sick sinus syndrome, intermittent heart block, paroxysmal atrial      fibrillation since 1998.  2.  Permanent transvenous pacemaker end of life.  3.  Systemic hypertension.  4.  Normal coronary arteries 1998 by catheterization, negative Cardiolite      August 2005.  5.  Gastroesophageal reflux disease.  6.  Peripheral neuropathy on gabapentin and Darvocet.  7.  Normal renal arteries.  8.  Status post laparoscopic cholecystectomy June 2006.   DISCHARGE DIAGNOSES:  1.  Sick sinus syndrome, intermittent heart block, paroxysmal atrial      fibrillation since 1998.  2.  Permanent transvenous pacemaker end of life.  3.  Systemic hypertension.  4.  Normal coronary arteries 1998 by catheterization, negative Cardiolite      August 2005.  5.  Gastroesophageal reflux disease.  6.  Peripheral neuropathy on gabapentin and Darvocet.  7.  Normal renal arteries.  8.  Status post laparoscopic cholecystectomy June 2006.   PROCEDURES:  1.  Explantation of a DDDR Pacesetter Trilogy DR model number [id number].  2.  Implantation of a [employer] Victory-XLDR, model number 5816 pulse      generator, serial number [id number] using existing Pacesetter passive      fixation by polar line implanted May 11, 1997.  Date of this is August 02, 2005.   BRIEF HISTORY:  Patient is a 77 year old white female medical patient of Dr.  Rollene Fare who we treated for many years for her sick sinus syndrome and  intermittent heart block and episodes of PAF.  She had a pacemaker placed  May 11, 1997, which has come to end of life.  She  also has a history of  systemic hypertension, normal coronaries and normal Cardiolite.  Her last  visit was July 25, 2005, at which time it was Dr. Lowella Fairy opinion  that she should undergo pacemaker generator.  He did not anticipate  requiring new leads.  X-ray in April showed good position of her atrial and  ventricular electrode.   MEDICATIONS ON ADMISSION:  1.  Gabapentin 300 mg daily.  2.  Diovan 80 mg daily.  3.  Toprol XL 50 mg daily.  4.  Digoxin 0.25 mg daily.  5.  Levothroid 0.1 mg daily.  6.  Nexium 40 mg daily.  7.  Odgen 1.5 mg daily.  8.  Aspirin 81 mg two daily.  9.  Darvocet p.r.n. for lower extremity pain.   HOSPITAL COURSE:  Patient was admitted and underwent the procedures  described above.  She tolerated it well and was transferred to the floor.  She has done well.  She was seen in the morning by Dr. Odette Fraction.  It  was her opinion that she was doing well and could be discharged home.  Wherein discharged on her preadmission medications as listed above.   DISCHARGE DIAGNOSES:  1.  End of life permanent transvenous pacemaker. History of sick sinus      syndrome.  Intermittent heart block.  Paroxysmal atrial fibrillation.  2.  Hypertension.  3.  Normal coronary arteries and normal Cardiolite.  4.  Gastroesophageal reflux disease.  5.  Peripheral neuropathy.  6.  Laparoscopic cholecystectomy.   ACTIVITY:  Light to moderate.  She will be given an instruction sheet to  follow for movement of her upper extremities.   She is continuing her same medications with no change.   I am going to have her come back in one week to see someone in the office to  check her wound and then two weeks prior to her return to [place].  She and  her husband spend half the year here and half the year in [place].      Lydia Guiles, P.A.      Richard A. Rollene Fare, M.D.  Electronically Signed    WDJ/MEDQ  D:  08/03/2005  T:  08/04/2005  Job:  [id number]

## 2011-03-01 NOTE — Assessment & Plan Note (Signed)
[facility] OFFICE NOTE   NAME:Jill Washington, Jill Washington                    MRN:          [id number]  DATE:01/16/2007                            DOB:          1926/06/25    CHIEF COMPLAINT:  Change in bowel, diarrhea.   ASSESSMENT:  Episodic diarrhea since the fall. It did start after some  antibiotics for UTI. She has had some dark brown stools and a lot of  mucus. She will go to the bathroom six times in a day on occasion. This  is not a daily problem, but it occurs most of the week.   It may very well be irritable bowel syndrome, but she has had a  significant change in bowel habits it seems here. There is some history  of irritable bowel like problem in the past from review of the records,  but she is convinced this is a much more serious and significant change  than she has ever had. A colonoscopy on 02/05/05 showed a 3 mm diminutive  rectal polyp that was otherwise unremarkable.   RECOMMENDATIONS AND PLAN:  1. I think a colonoscopy to investigate for the development of      inflammatory disease or perhaps even a polyp/carcinoma causing      these symptoms is reasonable. I have explained that to her.  2. She is at some high risk of problems because of the Coumadin and a      risk of stroke due to her atrial fibrillation, though she should be      able to safely come of that. We will ask Dr. Rollene Fare for his      approval on withholding her Coumadin for 5 days prior to her      colonoscopy. She understands the risks, benefits, and indications      and agrees to proceed.   HISTORY:  As above, Jill Washington has had problems since September. I had  seen her for abdominal pain and nausea and she had a solitary gallstone  and Dr. Fanny Skates performed a cholecystectomy in 2006. She did not  really develop diarrhea until a year after that perhaps. Colonoscopy  findings as outlined above. Around October, she started  having several  days out of the week where she has multiple urgent stools, she does have  some stress urinary and fecal incontinence (not really new overall). She  will see a lot of mucus, but she does not describe bleeding. There have  been dark stools which could be melenic, though they sound more like  they were very dark brown. If she takes Pepto Bismol her stools will  turn black. She does have some upper abdominal pain as well, but then  tends to radiate into the periumbilical and then is associated with  diarrhea. GI review of systems is otherwise negative.   MEDICATIONS:  1. Coumadin 2 mg as directed.  2. Pacerone 100 mg daily.  3. Gabapentin 300 mg daily.  4. Toprol XL 75 mg daily.  5. Nexium 40 mg at bedtime.  6. Levothroid 0.125 mg daily.  7. Diovan HCT 160/12.5  mg daily.   ALLERGIES:  None known.   PAST MEDICAL HISTORY:  1. Gallstones and cholecystectomy.  2. Hiatal hernia.  3. Diminutive colon polyp which was hyperplastic.  4. Pacemaker for arrhythmia.  5. Paroxysmal atrial fibrillation.  6. Sick sinus syndrome.  7. Possible mitral valve prolapse.  8. Peripheral neuropathy.  9. Hypertension.  10.Hypothyroidism.  11.Prior empyema of the right chest with tracheostomy and prolonged      illness.  12.Fibromyalgia.  13.Salivary gland stones.  14.Prior hysterectomy.  15.Prior bilateral salpingo-oophorectomy.  16.Prior cervical spine surgery.  17.Prior skin cancer.   Family and social history were reviewed and unchanged from previous  consultation of 2006.   REVIEW OF SYSTEMS:  See my medical history form for full details of  this.   PHYSICAL EXAMINATION:  GENERAL:  Elderly white woman in no acute  distress.  VITAL SIGNS:  Weight 179 pounds, pulse 80, blood pressure 132/80.  EYES:  Anicteric.  MOUTH:  Free of lesions.  NECK:  Supple, no thyromegaly or mass.  CHEST:  Clear, good effort, no labored breathing.  HEART:  S1, S2. I hear no rubs, murmurs, or  gallops. It appears to be in  regular rhythm.  ABDOMEN:  Soft, except that it is mildly tender in a diffuse fashion  without organomegaly or masses.  RECTAL:  Differed.  LYMPHATIC:  No neck or supraclavicular nodes.  SKIN:  Multiple actinic lesions. She has some areas of eschar and  scarring and freshly healing areas after treatment with liquid nitrogen,  on the hands and forearms.  PSYCHIATRIC:  She is awake, alert, and oriented x3.   I appreciate the opportunity to care for this patient.     Gatha Mayer, MD,FACG  Electronically Signed    CEG/MedQ  DD: 01/16/2007  DT: 01/17/2007  Job #: [id number]   cc:   Delfino Lovett A. Rollene Fare, M.D.  Deborra Medina. Lenna Gilford, MD

## 2011-03-01 NOTE — Assessment & Plan Note (Signed)
[facility]                               PULMONARY OFFICE NOTE   NAME:Jill Washington, Jill Washington                    MRN:          [id number]  DATE:07/17/2006                            DOB:          [birth year]    PRIMARY SERVICE/ACUTE EXTENDED OFFICE VISIT:   HISTORY:  A 77 year old white female who considers Dr. Lenna Gilford her primary but  lives in [place], [place], and is seen today in Dr. Jeannine Kitten absence while  she was visiting her farm in [place].  Over the last three weeks, she has  developed a persistent sensation of dysuria associated with abdominal  bloating that only partially responded to ancillary antibiotics and has left  her feeling bloated.  She denies any diarrhea or fever but has as general  sensation of abdominal bloating that is partially relieved by bowel  movements.  She also had back pain.  She denies any nausea and states that  her appetite is actually quite good.  All of these symptoms, including the  abdominal bloating, started before the antibiotics were actually given.  She  denies any unusual exposure history or previous history of chronic abdominal  complaints.   Past medical history is significant for hypertension, atrial fib,  hypothyroidism, GERD, DJD.  Her last colonoscopy was on February 05, 2005,  significant for a tiny polyp only.  Upper endoscopy showed a small hiatal  hernia also on February 05, 2005.   ALLERGIES:  None known.   MEDICATIONS:  Taken in detail on the work sheet.  Note, she does take Nexium  at bedtime and is on chronic Coumadin.   SOCIAL HISTORY:  She is a remote smoker.   FAMILY HISTORY:  Negative for GI disorders.   PHYSICAL EXAMINATION:  GENERAL:  This is a pleasant ambulatory white female  in no acute distress.  VITAL SIGNS:  Afebrile with normal vital signs.  HEENT:  Unremarkable.  Her oropharynx is clear.  LUNGS:  Perfectly clear bilaterally to auscultation and percussion.  HEART:  Regular rhythm  without murmur, rub or gallop.  ABDOMEN:  Mild tenderness diffusely with negative rebound or guarding.  There was no palpable organ enlargement, including bladder.  BACK:  No evidence of costovertebral angle tenderness or iliopsoas sign.  EXTREMITIES:  Warm without calf tenderness, clubbing, cyanosis or edema.   IMPRESSION:  New onset of abdominal pain associated with dysuria with  frequency, not clearly related to a urinary tract infection, which certainly  would not cause abdominal bloating without palpable bladder.  She assures me  the bloating occurred before the antibiotics were given, ruling out the  likelihood of a colitis from antibiotics.  Note she has no fever and  actually looks quite healthy.   The recommendation is to proceed with a lab profile, which will include CBC  with chemistry profile and also a CT scan of the abdomen and pelvis with  followup by GI (Dr. Carlean Purl), depending on findings and response to empiric  Citrucel 1 teaspoon b.i.d. along with Nexium switched to q.a.m., 30 minutes  before meals, instead of at bedtime.  ______________________________  Christena Deem Melvyn Novas, MD, [facility]      MBW/MedQ  DD:  07/17/2006  DT:  07/18/2006  Job #:  [id number]

## 2011-03-01 NOTE — Op Note (Signed)
NAMEOVI, DOKE             ACCOUNT NO.:  [id number]   MEDICAL RECORD NO.:  [id number]          PATIENT TYPE:  OIB   LOCATION:  2899                         FACILITY:  [facility]   PHYSICIAN:  Richard A. Rollene Fare, M.D.DATE OF BIRTH:  06-May-1926   DATE OF PROCEDURE:  08/02/2005  DATE OF DISCHARGE:                                 OPERATIVE REPORT   PROCEDURE:  Explantation of end of life DDDR Pacesetter Trilogy DR, model  number [id number], serial number [id number] (DOI May 11, 1997) and implantation of  new [employer], model number 5816, pulse generator serial  number [id number], using existing Pacesetter passive fixation bipolar in line  coaxial IS1 atrial and ventricular electrodes  (DOI May 11, 1997),  temporary transvenous pacemaker insertion percutaneously right femoral vein.   COMPLICATIONS:  None.   IMPLANTING PHYSICIAN:  Richard A. Rollene Fare, M.D.   ESTIMATED BLOOD LOSS:  Approximately 30 mL.   ANESTHESIA:  5 mg Valium p.o. premedication, 2 mg Versed IV, 25 mcg Fentanyl  IV, 1 gram Ancef IV antibiotic prophylaxis preoperatively.   Atrial electrode:  Pacesetter number [id number], serial number [id number] (DOI May 11, 1997).  Ventricular electrode:  Pacesetter 1346T, serial number [id number] (DOI May 11, 1997).   PREOPERATIVE DIAGNOSIS:  1.  Sick sinus syndrome, symptomatic bradycardia with past history of      syncope and intermittent heart block with paroxysmal atrial flutter in      1998.  2.  PTVP implant [employer] Trilogy DR May 11, 1997.  3.  End of life pulse generator reached on outpatient follow up.  4.  Systemic hypertension.  5.  Normal coronary arteries and left ventricle in 1998, negative Cardiolite      for ischemia August 2005.  6.  Laparoscopic cholecystectomy June 2006, Dr. Dalbert Batman, uncomplicated.  7.  Peripheral neuropathy on Gabapentin.  8.  Gastroesophageal reflux disease.  9.  Normal renal arteries in the past.  10. Episodic short lived runs  of paroxysmal atrial fibrillation being      monitored for possible need for anti-arrhythmia therapy and      anticoagulation, asymptomatic.  11. Mitral valve prolapse.  12. Underlying right bundle branch block pacemaker dependent on ventricular      channel.   POSTOPERATIVE DIAGNOSIS:  1.  Sick sinus syndrome, symptomatic bradycardia with past history of      syncope and intermittent heart block with paroxysmal atrial flutter in      1998.  2.  PTVP implant [employer] Trilogy DR May 11, 1997.  3.  End of life pulse generator reached on outpatient follow up.  4.  Systemic hypertension.  5.  Normal coronary arteries and left ventricle in 1998, negative Cardiolite      for ischemia August 2005.  6.  Laparoscopic cholecystectomy June 2006, Dr. Dalbert Batman, uncomplicated.  7.  Peripheral neuropathy on Gabapentin.  8.  Gastroesophageal reflux disease.  9.  Normal renal arteries in the past.  10. Episodic short lived runs of paroxysmal atrial fibrillation being      monitored for possible need for anti-arrhythmia therapy and  anticoagulation, asymptomatic.  11. Mitral valve prolapse.  12. Underlying right bundle branch block pacemaker dependent on ventricular      channel.    Atrial bipolar thresholds:  P 3.3 MV, resistance 434, threshold 0.5 V at 0.5  MS.  Atrial unipolar:  P 4.4 MV, resistance 495 ohms, threshold 0.4 volts at 0.5  milliseconds.  Ventricular bipolar:  R waves not measured since the patient is pacer  dependent, resistance 535 ohms, threshold 0.5 V at 0.5 milliseconds.  Ventricular unipolar:  R waves not measured, resistance 434 ohms, threshold  0.4 volts at 0.5 milliseconds.   Magnet rate at BAL equals 98.5, magnet rate at ERI drops to 86.3.   DESCRIPTION OF PROCEDURE:  The patient was admitted as a same day admission.  Laboratory on the day of admission showed normal coags, CBC, BMP, and  cholesterol 134, LDL 70, HDL 30.  She was premedicated with 1 gram Ancef IV   and 5 mg Valium p.o. for sedation.  The patient was brought to the second  floor CP lab.  The right chest was prepped and draped in the usual manner  and the right groin was prepped and draped in the usual manner.  The right  femoral vein was entered under 1% Xylocaine anesthesia with a single  anterior puncture using an 18 thin wall needle with a 6 French short venous  sheath inserted without difficulty.  This was secured to the skin and the  temporary transvenous pacemaker was implanted and was positioned in the RV  apex with excellent thresholds and back up pacing established.  This was  covered.  Regowning and gloving was then done.   We then approached the prior pacemaker site.  1% Xylocaine was used for  local anesthesia.  An infraclavicular curvilinear transverse incision was  performed slightly below the previously placed incision.  This was brought  down through the subcutaneous tissue of the fibrous capsule using blunt  dissection and careful electrocautery to control hemostasis.  The pacemaker  was freed up.  There was no calcification, it was moderately thick.  The  fibers were cut using electrocautery.  The electrodes were freed up and the  generator was delivered from the pocket with the single retention suture  removed.  The electrodes were unhooked from the generator unscrewing the  single hex nut.  On my inspection, it appeared to be normal with no pin  connectors, no blood in the lead.  There was excellent chronic atrial and  ventricular unipolar and bipolar thresholds and no esophageal or  diaphragmatic pacing at 10 volt output on either lead proving PSA unipolar  and bipolar.  The pocket was irrigated with 500 mg Kanamycin solution.  The  new generator was hooked to the electrodes in the proper AV sequence with  the hex nuts tightened, delivered into the pocket, and loosely approximated in the fibrous capsule with the number 1 silk suture to prevent migration.  The  sponge count was correct.  The subcutaneous tissue was closed with two  separate running layers of Vicryl suture and the skin was closed with 4-0  subcuticular Vicryl suture.  Steri-Strips were applied.  Fluoroscopy showed  good  position of the new pulse generator and electrodes.  The temporary  transvenous pacemaker was then removed under fluoroscopic control.  The  patient was transferred to the holding area for postoperative care, sheath  removal, and pressure hemostasis.  She tolerated the procedure well.      Richard A.  Rollene Fare, M.D.  Electronically Signed     RAW/MEDQ  D:  08/02/2005  T:  08/02/2005  Job:  [id number]   cc:   Deborra Medina. Lenna Gilford, M.D. [facility]  [address]  [place] [address]

## 2011-03-13 ENCOUNTER — Telehealth: Payer: Self-pay | Admitting: Pulmonary Disease

## 2011-03-13 MED ORDER — ATENOLOL 50 MG PO TABS: 50.0000 mg | ORAL_TABLET | Freq: Every day | ORAL | Status: DC

## 2011-03-13 NOTE — Telephone Encounter (Signed)
lmom that rx has been sent to the pharmacy

## 2011-05-07 ENCOUNTER — Other Ambulatory Visit (INDEPENDENT_AMBULATORY_CARE_PROVIDER_SITE_OTHER): Payer: Medicare Other

## 2011-05-07 ENCOUNTER — Encounter: Payer: Self-pay | Admitting: Adult Health

## 2011-05-07 ENCOUNTER — Ambulatory Visit (INDEPENDENT_AMBULATORY_CARE_PROVIDER_SITE_OTHER): Payer: Medicare Other | Admitting: Adult Health

## 2011-05-07 VITALS — BP 134/74 | HR 69 | Temp 97.0°F | Ht 67.0 in | Wt 191.8 lb

## 2011-05-07 DIAGNOSIS — N3281 Overactive bladder: Secondary | ICD-10-CM | POA: Insufficient documentation

## 2011-05-07 DIAGNOSIS — M549 Dorsalgia, unspecified: Secondary | ICD-10-CM | POA: Insufficient documentation

## 2011-05-07 DIAGNOSIS — R300 Dysuria: Secondary | ICD-10-CM

## 2011-05-07 DIAGNOSIS — N390 Urinary tract infection, site not specified: Secondary | ICD-10-CM

## 2011-05-07 HISTORY — DX: Overactive bladder: N32.81

## 2011-05-07 LAB — URINALYSIS, ROUTINE W REFLEX MICROSCOPIC
Hgb urine dipstick: NEGATIVE
Ketones, ur: NEGATIVE
Total Protein, Urine: NEGATIVE
Urine Glucose: NEGATIVE

## 2011-05-07 MED ORDER — CIPROFLOXACIN HCL 250 MG PO TABS: 250.0000 mg | ORAL_TABLET | Freq: Two times a day (BID) | ORAL | Status: AC

## 2011-05-07 NOTE — Assessment & Plan Note (Signed)
UA shows positive leuk/nitrate >>UTI   Plan:  Cipro 250mg  Twice daily  For 7 days  Fluids and rest  Warm heat to back  Please contact office for sooner follow up if symptoms do not improve or worsen or seek emergency care  follow up Dr. Lenna Gilford  As planned and As needed

## 2011-05-07 NOTE — Progress Notes (Signed)
Subjective:    Patient ID: Jill Washington, female    DOB: 07-22-1926, 77 y.o.   MRN: [id number]  HPI 77 y/o WF here for a follow up visit... she has multiple medical problems as noted below... Followed for general medical purposes w/ hx of Empyema & critical illness in 1982 from which she made a nice recovery, HBP, MVP, AFib & SSS w/ pacer, Hyperchol, Hypothy, HH/ IBS/ Colon Polyps, Neuropathy/ FM... she is also followed by [name] for Cards, [name] for GI, & [name] for Neuro...  ~  September 24, 2010:  31mo ROV & post hosp check> adm 11/11 by [facility] right leg cellulitis, failed outpt Keflex given by [facility], treated w/ Unasyn then Augmentin & improved (cult neg), VenDopplers neg for DVT... redness decr, some pain but better, more mobile, etc... had assoc mild RI w/ Creat= 1.09 --> 1.57, and mild anemia w/ Hg= 10.5 --> 11.4 (Fe=54, Ferritin=142, B12=237, Folate>20)... disch on same meds as before + Tramadol for pain... we discussed adding oral B12 ~59mcg/d & we will follow up.  ~  January 22, 2011:  57mo ROV & improved overall- still having some IBS issues & states the Levsin didn't help (Immodium helps the diarrhea);  Offered other med to try vs f/u w/ GI for their review- she has seen [name] & will call for an appt;  She saw [name] 1/12- hx SSS, pacer w/ generator ch in 2006, HBP, PAF on Multaq, neg coronary work up, etc> no med changes made;  We reviewed her med regimen & did fasting blood work today> see prob list below:  ~05/07/2011 Acute OV  Pt presents for an acute work in. Complains of right-sided back discomfort that radiates toward the abdomen and into the groin area x2days.. Has urinary incontinence but no increased frequency or urgency. No hematuria.  Hurts to turn and tender to touch. No leg weakness or radicular symptoms.  Seen at urgent care 3 weeks ago, tx for UTI with abx per pt. No records available        Problem List:  Hx of EMPYEMA (ICD-510.9) - hx of critical illness in  1982 w/ epiglottitis, sepsis, empyema, resp failure w/ ARDS, critical illness myopathy & polyneuropathy- from which she made a remarkable recovery... ~  CXR 1/11 showed mild cardiomeg, right pacemaker, mild basilar scarring, NAD.  HYPERTENSION (ICD-401.9) - on ATENOLOL 50mg /d &  DIOVAN/Hct 160-12.5 daily... She is supposed to monitor BP at home. ~  4/12:  BP today = 150/80, taking meds regularly, tolerating meds well... she denies HA, fatigue, visual changes, CP, palipit, dizziness, syncope, dyspnea, edema, etc...  MITRAL VALVE PROLAPSE (ICD-424.0) - no complaints of CP or palpit... cath in 1998 showed norm coronaries + MVP...  ~  NuclearStressTest 8/05 showed inferior wall artifact, no ischemia, EF= 69%... ~  2DEcho 4/06 by [facility] showed mild intermittent prolapse of the ant leaflet of the MV, norm LV size w/ mod ASH... ~  f/u Myoview 11/09 showed norm perfusion w/o infarct, w/o ischemia. ~  repeat 2DEcho 6/10 showed norm LVF, norm valves, DD. ~  repeat 2DEcho 10/11 showed mild conc LVH, norm LVF w/ EF>55% ~  repeat Myoview 11/11 showed no wall motion abn, no ischemia, no changes.  Hx of ATRIAL FIBRILLATION (ICD-427.31) & SICK SINUS SYNDROME (ICD-427.81) - on COUMADIN & MULTAQ 400mg Bid, followed by [facility]... pacemaker placed by [name] in 1998 (SSS) and followed by him... Generator changed 10/06 and Pacer checked periodically- doing well...  ~  11/10:  [name] changed Amio  to [place] 400mg Bid...  VENOUS INSUFFICIENCY (ICD-459.81) - she follows a low sodium diet, elevates legs, support hose when able... ~  11/11:  Adm w/ right leg cellultis, VenDopplers neg for DVT, resolved w/ Unasyn ==> Augmentin.  HYPERCHOLESTEROLEMIA (ICD-272.0) - on PRAVASTATIN 40mg Qhs + diet & exercise... ~  [place] 9/09 on diet alone showed TChol 268, TG 220, HDL 42, LDL 149... Prav40 started. ~  [place] 1/11 on Prav40 showed TChol 152, TG 109, HDL 48, LDL 82... keep same. ~  [place] 4/12 on Prav40 showed TChol 142, TG 184, HDL 36, LDL  69  HYPOTHYROIDISM (ICD-244.9) - on LEVOTHYROID 184mcg/d... she is due for f/u labs... ~  last labs 10/08 by [name] showed TSH= 6.31... dose incr to 125 at that time... ~  labs 9/09 on Levo125 showed TSH= 5.09 ~  labs 1/11 on Levo125 showed TSH= 4.59 ~  Labs 4/12 on Levo125 showed TSH= 1.69  HIATAL HERNIA (ICD-553.3) - on NEXIUM 40mg  Prn & she is asked to take this daily... last EGD 4/06 showed HH, otherw neg...  POLYP, COLON (ICD-211.3) - last colonoscopy 4/08 by [name] showed divertics, 21mm polyp removed= hyperplastic...  and some spasm- "IBS response" to scope... She states that [place] didn't help her cramping but Immodiium does help the diarrhea... She will f/u w/ [name].  FIBROMYALGIA (ICD-729.1) - she uses TRAMADOL 50mg  Prn & METHOCARBAMOL Prn for musc spasm.  VITAMIN D DEFICIENCY (ICD-268.9) - prev on Vit D 50K weekly & switched to 1000 u OTC daily... ~  labs 9/09 showed Vit D level = 16... start Vit D 50. ~  labs 1/11 on VitD 50K showed Vit D level = 34... OK switch to 1000 u daily.  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 ~  11/10:  [name] tried switch Neurontin to Cymbalta for neuropathy symptoms- she reacted to the Cymbalta w/ trembling, weakness, confusion & changed back to GABAPENTIN 300mg  Qd... we discussed Neuro appt > ~  2/11:  Neuro eval [name] w/ EMG/ NCV showing severe length dependent axonal sensorimotor polyneuropathy.  BORDERLINE B12 LEVELS - rec to take Vit B12 orally ~526mcg/d... ~  labs 1/11 showed B12= 267 (211-911) ~  labs 11/11 in hosp showed B12= 237 (she wasn't taking any B12 supplement) & rec to start 549mcg/d. ~  Labs 4/12 showed B12 level = 775, continue oral supplementation...  MILD ANEMIA - Labs have been WNL thru office, but mild anemia found 11/11 hosp for cellulitis... on Coumadin per [facility]. ~  labs 11/11 in hosp showed Hg=  11.0 ==> 10.5, Fe=54 (22%sat), Ferritin=142 ~  Labs 4/12 showed Hg= 12.9 on MVI & B12 supplementation...  SKIN CANCER, HX OF (ICD-V10.83) - followed by Derm... 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  Health Maintenance - GYN= DrRKaplan but she understood him to say she didn't need f/u... had Mammogram & BMD in ?2006 but she's not sure...  takes Calcium, MVI, Vit D...   Past Surgical History  Procedure Date  . Cervical spine surgery   . Cholecystectomy 03/2005    by Dr. Dalbert Batman  . Total abdominal hysterectomy w/ bilateral salpingoophorectomy     Outpatient Encounter Prescriptions as of 05/07/2011  Medication Sig Dispense Refill  . atenolol (TENORMIN) 50 MG tablet Take 1 tablet (50 mg total) by mouth daily.  90 tablet  3  . dronedarone (MULTAQ) 400 MG tablet Take 400 mg by mouth  2 (two) times daily with a meal.        . esomeprazole (NEXIUM) 40 MG capsule Take 1 capsule (40 mg total) by mouth daily before breakfast.  90 capsule  3  . gabapentin (NEURONTIN) 300 MG capsule Take 1 capsule (300 mg total) by mouth daily.  90 capsule  3  . hyoscyamine (LEVSIN) 0.125 MG/5ML ELIX Take 125 mcg by mouth every 6 (six) hours as needed. As needed for abdominal cramping       . levothyroxine (SYNTHROID, LEVOTHROID) 125 MCG tablet Take 1 tablet (125 mcg total) by mouth daily.  90 tablet  3  . loperamide (IMODIUM) 2 MG capsule Take 2 mg by mouth 4 (four) times daily as needed.        . Multiple Vitamins-Minerals (QC WOMENS DAILY MULTIVITAMIN PO) Take 1 tablet by mouth daily.        . pravastatin (PRAVACHOL) 40 MG tablet Take 1 tablet (40 mg total) by mouth daily.  90 tablet  3  . traMADol (ULTRAM) 50 MG tablet Take 1 tablet (50 mg total) by mouth every 6 (six) hours as needed. As needed for pain and may take with tylenol  100 tablet  1  . valsartan-hydrochlorothiazide (DIOVAN-HCT) 160-12.5 MG per tablet Take 1 tablet by mouth daily.  90 tablet  3  . vitamin B-12 (CYANOCOBALAMIN) 500 MCG  tablet Take 500 mcg by mouth daily.        Marland Kitchen warfarin (COUMADIN) 2 MG tablet Take as directed by Dr. Rollene Fare         Allergies  Allergen Reactions  . Iodinated Diagnostic Agents   . Other     Dial Soap    Review of Systems Constitutional:   No  weight loss, night sweats,  Fevers, chills, fatigue, or  lassitude.  HEENT:   No headaches,  Difficulty swallowing,  Tooth/dental problems, or  Sore throat,                No sneezing, itching, ear ache, nasal congestion, post nasal drip,   CV:  No chest pain,  Orthopnea, PND, swelling in lower extremities, anasarca, dizziness, palpitations, syncope.   GI  No heartburn, indigestion, abdominal pain, nausea, vomiting, diarrhea, change in bowel habits, loss of appetite, bloody stools.   Resp: No shortness of breath with exertion or at rest.  No excess mucus, no productive cough,  No non-productive cough,  No coughing up of blood.  No change in color of mucus.  No wheezing.  No chest wall deformity  Skin: no rash or lesions.  GU: no dysuria, change in color of urine, no urgency or frequency.  No flank pain, no hematuria   MS:  No joint pain or swelling.  No decreased range of motion.  No back pain.  Psych:  No change in mood or affect. No depression or anxiety.  No memory loss.        Objective:   Physical Exam       WD, WN, 77 y/o WF in NAD... GENERAL:  Alert & oriented; pleasant & cooperative... HEENT:  [place]/AT, EOM-wnl, PERRLA, EACs-clear, TMs-wnl, NOSE-clear, THROAT-clear & wnl. NECK:  Supple w/ fairROM; no JVD; normal carotid impulses w/o bruits; no thyromegaly or nodules palpated; no lymphadenopathy. CHEST:  Clear to P & A; without wheezes/ rales/ or rhonchi heard... HEART:  Regular Rhythm; pacer, gr 1-2/6 SEM S4 no rubs or S3... ABDOMEN:  Soft & nontender; normal bowel sounds; no organomegaly or masses detected. EXT: without deformities,  mod arthritic changes; no varicose veins/ +venous insuffic/ 1+edema. Tender along right  lumbar sacral region, no rash. Neg SLR.  NEURO:  CN's intact;  she has a severe peripheral neuropathy w/ decr sensation in LEs, gait abn, decr DTRs. DERM:  dry skin & port-wine birthmark on left chest...   Assessment & Plan:

## 2011-05-07 NOTE — Patient Instructions (Signed)
Cipro 250mg  Twice daily  For 7 days  Fluids and rest  Warm heat to back  Please contact office for sooner follow up if symptoms do not improve or worsen or seek emergency care  follow up Dr. Lenna Gilford  As planned and As needed

## 2011-05-10 LAB — URINE CULTURE: Colony Count: >=100000

## 2011-06-21 ENCOUNTER — Other Ambulatory Visit: Payer: Self-pay | Admitting: *Deleted

## 2011-06-21 MED ORDER — ATENOLOL 50 MG PO TABS: 50.0000 mg | ORAL_TABLET | Freq: Every day | ORAL | Status: DC

## 2011-07-23 ENCOUNTER — Ambulatory Visit: Payer: Medicare Other | Admitting: Pulmonary Disease

## 2011-07-24 LAB — CULTURE, BLOOD (ROUTINE X 2)
Culture: NO GROWTH
Culture: NO GROWTH

## 2011-07-24 LAB — URINE MICROSCOPIC-ADD ON

## 2011-07-24 LAB — DIFFERENTIAL
Lymphocytes Relative: 5 — ABNORMAL LOW
Monocytes Absolute: 0.7
Monocytes Relative: 7
Neutro Abs: 8.8 — ABNORMAL HIGH

## 2011-07-24 LAB — WET PREP, GENITAL
Trich, Wet Prep: NONE SEEN
Yeast Wet Prep HPF POC: NONE SEEN

## 2011-07-24 LAB — URINE CULTURE: Colony Count: >=100000

## 2011-07-24 LAB — URINALYSIS, ROUTINE W REFLEX MICROSCOPIC
Bilirubin Urine: NEGATIVE
Ketones, ur: NEGATIVE
Specific Gravity, Urine: 1.021
pH: 5.5

## 2011-07-24 LAB — POCT I-STAT CREATININE
Creatinine, Ser: 1.4 — ABNORMAL HIGH
Operator id: 285841

## 2011-07-24 LAB — I-STAT 8, (EC8 V) (CONVERTED LAB)
BUN: 17
Bicarbonate: 25.5 — ABNORMAL HIGH
Glucose, Bld: 104 — ABNORMAL HIGH
Hemoglobin: 13.6
Potassium: 4.1
Sodium: 139

## 2011-07-24 LAB — CBC
Hemoglobin: 13.2
RBC: 4.36

## 2011-07-24 LAB — POCT CARDIAC MARKERS: Myoglobin, poc: 169

## 2011-07-24 LAB — GC/CHLAMYDIA PROBE AMP, GENITAL: Chlamydia, DNA Probe: NEGATIVE

## 2011-07-31 ENCOUNTER — Ambulatory Visit (INDEPENDENT_AMBULATORY_CARE_PROVIDER_SITE_OTHER): Payer: Medicare Other | Admitting: Pulmonary Disease

## 2011-07-31 ENCOUNTER — Encounter: Payer: Self-pay | Admitting: Pulmonary Disease

## 2011-07-31 DIAGNOSIS — I872 Venous insufficiency (chronic) (peripheral): Secondary | ICD-10-CM

## 2011-07-31 DIAGNOSIS — I4891 Unspecified atrial fibrillation: Secondary | ICD-10-CM

## 2011-07-31 DIAGNOSIS — E039 Hypothyroidism, unspecified: Secondary | ICD-10-CM

## 2011-07-31 DIAGNOSIS — K589 Irritable bowel syndrome without diarrhea: Secondary | ICD-10-CM

## 2011-07-31 DIAGNOSIS — E559 Vitamin D deficiency, unspecified: Secondary | ICD-10-CM

## 2011-07-31 DIAGNOSIS — E7800 Pure hypercholesterolemia, unspecified: Secondary | ICD-10-CM

## 2011-07-31 DIAGNOSIS — G609 Hereditary and idiopathic neuropathy, unspecified: Secondary | ICD-10-CM

## 2011-07-31 DIAGNOSIS — I495 Sick sinus syndrome: Secondary | ICD-10-CM

## 2011-07-31 DIAGNOSIS — IMO0001 Reserved for inherently not codable concepts without codable children: Secondary | ICD-10-CM

## 2011-07-31 DIAGNOSIS — I10 Essential (primary) hypertension: Secondary | ICD-10-CM

## 2011-07-31 DIAGNOSIS — K449 Diaphragmatic hernia without obstruction or gangrene: Secondary | ICD-10-CM

## 2011-07-31 DIAGNOSIS — I059 Rheumatic mitral valve disease, unspecified: Secondary | ICD-10-CM

## 2011-07-31 DIAGNOSIS — D126 Benign neoplasm of colon, unspecified: Secondary | ICD-10-CM

## 2011-07-31 DIAGNOSIS — Z950 Presence of cardiac pacemaker: Secondary | ICD-10-CM

## 2011-07-31 MED ORDER — TRAMADOL HCL 50 MG PO TABS: 50.0000 mg | ORAL_TABLET | Freq: Four times a day (QID) | ORAL | Status: DC | PRN

## 2011-07-31 MED ORDER — GABAPENTIN 300 MG PO CAPS: 300.0000 mg | ORAL_CAPSULE | Freq: Two times a day (BID) | ORAL | Status: DC

## 2011-07-31 NOTE — Patient Instructions (Signed)
Today we updated your med list in EPIC...    We refilled your TRAMADOL, and wrote a new prescription for the GABAPENTIN- take one tab twice daily...  We reviewed your previous lab work today...  Call for any questions...  Let's plan a follow up visit in 6 months w/ follow up fasting blood work at that time.Marland KitchenMarland Kitchen

## 2011-08-11 ENCOUNTER — Encounter: Payer: Self-pay | Admitting: Pulmonary Disease

## 2011-08-11 NOTE — Progress Notes (Signed)
Subjective:    Patient ID: Jill Washington, female    DOB: 02-Jul-1926, 77 y.o.   MRN: [id number]  HPI 77 y/o WF here for a follow up visit... she has multiple medical problems as noted below... She is the sister of [name]. Followed for general medical purposes w/ hx of Empyema & critical illness in 1982 from which she made a nice recovery, HBP, MVP, AFib & SSS w/ pacer, Hyperchol, Hypothy, HH/ IBS/ Colon Polyps, Neuropathy/ FM... she is also followed by [name] for Cards, [name] for GI, & [name] for Neuro...  ~  September 24, 2010:  58mo ROV & post hosp check> adm 11/11 by [facility] right leg cellulitis, failed outpt Keflex given by [facility], treated w/ Unasyn then Augmentin & improved (cult neg), VenDopplers neg for DVT... redness decr, some pain but better, more mobile, etc... had assoc mild RI w/ Creat= 1.09 --> 1.57, and mild anemia w/ Hg= 10.5 --> 11.4 (Fe=54, Ferritin=142, B12=237, Folate>20)... disch on same meds as before + Tramadol for pain... we discussed adding oral B12 ~532mcg/d & we will follow up.  ~  January 22, 2011:  61mo ROV & improved overall- still having some IBS issues & states the Levsin didn't help (Immodium helps the diarrhea);  Offered other med to try vs f/u w/ GI for their review- she has seen [name] & will call for an appt;  She saw [name] 1/12- hx SSS, pacer w/ generator ch in 2006, HBP, PAF on Multaq, neg coronary work up, etc> no med changes made;  We reviewed her med regimen & did fasting blood work today> see prob list below:  ~  July 31, 2011:  52mo ROV & she has been stable overall w/ a few IBS related issues & neuropathy related discomfort;  She saw [name] 8/12 & pacer check was OK, doing well overall & no changes made to her regimen;  We reviewed her prev labs and as she is not fasting today we decided to just recheck her blood work on return;         Problem List:  Hx of EMPYEMA (ICD-510.9) - hx of critical illness in 1982 w/ epiglottitis, sepsis, empyema,  resp failure w/ ARDS, critical illness myopathy & polyneuropathy- from which she made a remarkable recovery... ~  CXR 1/11 showed mild cardiomeg, right pacemaker, mild basilar scarring, NAD.  HYPERTENSION (ICD-401.9) - on ATENOLOL 50mg /d &  DIOVAN/Hct 160-12.5 daily... She is supposed to monitor BP at home. ~  10/12:  BP today = 130/64, taking meds regularly, tolerating meds well... she denies HA, fatigue, visual changes, CP, palipit, dizziness, syncope, dyspnea, edema, etc...  MITRAL VALVE PROLAPSE (ICD-424.0) - no complaints of CP or palpit... cath in 1998 showed norm coronaries + MVP...  ~  NuclearStressTest 8/05 showed inferior wall artifact, no ischemia, EF= 69%... ~  2DEcho 4/06 by [facility] showed mild intermittent prolapse of the ant leaflet of the MV, norm LV size w/ mod ASH... ~  f/u Myoview 11/09 showed norm perfusion w/o infarct, w/o ischemia. ~  repeat 2DEcho 6/10 showed norm LVF, norm valves, DD. ~  repeat 2DEcho 10/11 showed mild conc LVH, norm LVF w/ EF>55% ~  repeat Myoview 11/11 showed no wall motion abn, no ischemia, no changes.  Hx of ATRIAL FIBRILLATION (ICD-427.31) & SICK SINUS SYNDROME (ICD-427.81) - on COUMADIN & MULTAQ 400mg Bid, followed by [facility]... pacemaker placed by [name] in 1998 ([facility]) and followed by him... Generator changed 10/06 and Pacer checked periodically- doing well...  ~  11/10:  [name] changed Amio to [place] 400mg Bid...  VENOUS INSUFFICIENCY (ICD-459.81) - she follows a low sodium diet, elevates legs, support hose when able... ~  11/11:  Adm w/ right leg cellultis, VenDopplers neg for DVT, resolved w/ Unasyn ==> Augmentin.  HYPERCHOLESTEROLEMIA (ICD-272.0) - on PRAVASTATIN 40mg Qhs + diet & exercise... ~  [place] 9/09 on diet alone showed TChol 268, TG 220, HDL 42, LDL 149... Prav40 started. ~  [place] 1/11 on Prav40 showed TChol 152, TG 109, HDL 48, LDL 82... keep same. ~  [place] 4/12 on Prav40 showed TChol 142, TG 184, HDL 36, LDL 69  HYPOTHYROIDISM (ICD-244.9) - on  LEVOTHYROID 141mcg/d... she is due for f/u labs... ~  last labs 10/08 by [name] showed TSH= 6.31... dose incr to 125 at that time... ~  labs 9/09 on Levo125 showed TSH= 5.09 ~  labs 1/11 on Levo125 showed TSH= 4.59 ~  Labs 4/12 on Levo125 showed TSH= 1.69  HIATAL HERNIA (ICD-553.3) - on NEXIUM 40mg  Prn & she is asked to take this daily... last EGD 4/06 showed HH, otherw neg...  POLYP, COLON (ICD-211.3) - last colonoscopy 4/08 by [name] showed divertics, 80mm polyp removed= hyperplastic...  and some spasm- "IBS response" to scope... She states that [place] didn't help her cramping but Immodiium does help the diarrhea... She will f/u w/ [name].  FIBROMYALGIA (ICD-729.1) - she uses TRAMADOL 50mg  Prn & METHOCARBAMOL Prn for musc spasm.  VITAMIN D DEFICIENCY (ICD-268.9) - prev on Vit D 50K weekly & switched to 1000 u OTC daily... ~  labs 9/09 showed Vit D level = 16... start Vit D 50. ~  labs 1/11 on VitD 50K showed Vit D level = 34... OK switch to 1000 u daily.  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 ~  11/10:  [name] tried switch Neurontin to Cymbalta for neuropathy symptoms- she reacted to the Cymbalta w/ trembling, weakness, confusion & changed back to GABAPENTIN 300mg  Qd... we discussed Neuro appt > ~  2/11:  Neuro eval [name] w/ EMG/ NCV showing severe length dependent axonal sensorimotor polyneuropathy.  BORDERLINE B12 LEVELS - rec to take Vit B12 orally ~572mcg/d... ~  labs 1/11 showed B12= 267 (211-911) ~  labs 11/11 in hosp showed B12= 237 (she wasn't taking any B12 supplement) & rec to start 523mcg/d. ~  Labs 4/12 showed B12 level = 775, continue oral supplementation...  MILD ANEMIA - Labs have been WNL thru office, but mild anemia found 11/11 hosp for cellulitis... on Coumadin per [facility]. ~  labs 11/11 in hosp showed Hg= 11.0 ==> 10.5, Fe=54 (22%sat),  Ferritin=142 ~  Labs 4/12 showed Hg= 12.9 on MVI & B12 supplementation...  SKIN CANCER, HX OF (ICD-V10.83) - followed by Derm... 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  Health Maintenance - GYN= DrRKaplan but she understood him to say she didn't need f/u... had Mammogram & BMD in ?2006 but she's not sure...  takes Calcium, MVI, Vit D...   Past Surgical History  Procedure Date  . Cervical spine surgery   . Cholecystectomy 03/2005    by Dr. Dalbert Batman  . Total abdominal hysterectomy w/ bilateral salpingoophorectomy     Outpatient Encounter Prescriptions as of 07/31/2011  Medication Sig Dispense Refill  . atenolol (TENORMIN) 50 MG tablet Take 1 tablet (50 mg total) by mouth daily.  30 tablet  5  . dronedarone (MULTAQ) 400 MG tablet  Take 400 mg by mouth 2 (two) times daily with a meal.        . esomeprazole (NEXIUM) 40 MG capsule Take 1 capsule (40 mg total) by mouth daily before breakfast.  90 capsule  3  . gabapentin (NEURONTIN) 300 MG capsule Take 1 capsule (300 mg total) by mouth 2 (two) times daily.  180 capsule  5  . hyoscyamine (LEVSIN) 0.125 MG/5ML ELIX Take 125 mcg by mouth every 6 (six) hours as needed. As needed for abdominal cramping       . levothyroxine (SYNTHROID, LEVOTHROID) 125 MCG tablet Take 1 tablet (125 mcg total) by mouth daily.  90 tablet  3  . loperamide (IMODIUM) 2 MG capsule Take 2 mg by mouth 4 (four) times daily as needed.        . pravastatin (PRAVACHOL) 40 MG tablet Take 1 tablet (40 mg total) by mouth daily.  90 tablet  3  . traMADol (ULTRAM) 50 MG tablet Take 1 tablet (50 mg total) by mouth every 6 (six) hours as needed for pain. As needed for pain and may take with tylenol  100 tablet  3  . valsartan-hydrochlorothiazide (DIOVAN-HCT) 160-12.5 MG per tablet Take 1 tablet by mouth daily.  90 tablet  3  . warfarin (COUMADIN) 2 MG tablet Take as directed by Dr. Rollene Fare       . DISCONTD: Multiple Vitamins-Minerals (QC WOMENS DAILY MULTIVITAMIN PO)  Take 1 tablet by mouth daily.        Marland Kitchen DISCONTD: vitamin B-12 (CYANOCOBALAMIN) 500 MCG tablet Take 500 mcg by mouth daily.          Allergies  Allergen Reactions  . Iodinated Diagnostic Agents   . Other     Dial Soap    Review of Systems        See HPI - all other systems neg except as noted...      The patient complains of decreased hearing, dyspnea on exertion, peripheral edema, muscle weakness, and difficulty walking.  The patient denies anorexia, fever, weight loss, weight gain, vision loss, hoarseness, chest pain, syncope, prolonged cough, headaches, hemoptysis, abdominal pain, melena, hematochezia, severe indigestion/heartburn, hematuria, incontinence, suspicious skin lesions, transient blindness, depression, unusual weight change, abnormal bleeding, enlarged lymph nodes, and angioedema.     Objective:   Physical Exam      WD, WN, 77 y/o WF in NAD... GENERAL:  Alert & oriented; pleasant & cooperative... HEENT:  /AT, EOM-wnl, PERRLA, EACs-clear, TMs-wnl, NOSE-clear, THROAT-clear & wnl. NECK:  Supple w/ fairROM; no JVD; normal carotid impulses w/o bruits; no thyromegaly or nodules palpated; no lymphadenopathy. CHEST:  Clear to P & A; without wheezes/ rales/ or rhonchi heard... HEART:  Regular Rhythm; pacer, gr 1-2/6 SEM S4 no rubs or S3... ABDOMEN:  Soft & nontender; normal bowel sounds; no organomegaly or masses detected. EXT: without deformities, mod arthritic changes; no varicose veins/ +venous insuffic/ 1+edema. NEURO:  CN's intact;  she has a severe peripheral neuropathy w/ decr sensation in LEs, gait abn, decr DTRs. DERM:  dry skin & port-wine birthmark on left chest...   Assessment & Plan:   PULM>  She remains stable w/o resp exac, etc...  HBP>  Good control on Aten + Diovan/ HCT, asked to do better job w/ calorie & sodium restriction, get wt down, monitor BP at home etc...  CARDS>  Followed by [name] w/ PAF, Pacer for SSS, etc;  Stable on current regimen w/ Multaq,  Coumadin, etc;  Continue same...  CHOL>  Chol OK on the Prav40 but incr TG & we discussed need for better low fat diet, get wt down, etc...  HYPOTHY>  Stable on the Synthroid 131mcg/d, continue same...  Neuropathy>  She hasn't followed up w/ Neuro [name]; discussed slowly inct the Gabapentin dose if nec (currently 300mg  Qhs)...  Anemia/ B12>  Her anemia is improved w/ Hg 12.9, and B12 is now normal on oral supplement.Marland KitchenMarland Kitchen

## 2011-10-15 LAB — HM COLONOSCOPY: HM Colonoscopy: NORMAL

## 2011-10-18 DIAGNOSIS — Z7901 Long term (current) use of anticoagulants: Secondary | ICD-10-CM | POA: Diagnosis not present

## 2011-10-18 DIAGNOSIS — I4891 Unspecified atrial fibrillation: Secondary | ICD-10-CM | POA: Diagnosis not present

## 2011-10-18 DIAGNOSIS — I447 Left bundle-branch block, unspecified: Secondary | ICD-10-CM | POA: Diagnosis not present

## 2011-10-29 DIAGNOSIS — L820 Inflamed seborrheic keratosis: Secondary | ICD-10-CM | POA: Diagnosis not present

## 2011-10-29 DIAGNOSIS — D485 Neoplasm of uncertain behavior of skin: Secondary | ICD-10-CM | POA: Diagnosis not present

## 2011-10-29 DIAGNOSIS — L570 Actinic keratosis: Secondary | ICD-10-CM | POA: Diagnosis not present

## 2011-11-05 DIAGNOSIS — I447 Left bundle-branch block, unspecified: Secondary | ICD-10-CM | POA: Diagnosis not present

## 2011-11-05 DIAGNOSIS — I4891 Unspecified atrial fibrillation: Secondary | ICD-10-CM | POA: Diagnosis not present

## 2011-11-18 DIAGNOSIS — Z7901 Long term (current) use of anticoagulants: Secondary | ICD-10-CM | POA: Diagnosis not present

## 2011-11-18 DIAGNOSIS — I4891 Unspecified atrial fibrillation: Secondary | ICD-10-CM | POA: Diagnosis not present

## 2011-12-16 DIAGNOSIS — I4891 Unspecified atrial fibrillation: Secondary | ICD-10-CM | POA: Diagnosis not present

## 2011-12-16 DIAGNOSIS — Z7901 Long term (current) use of anticoagulants: Secondary | ICD-10-CM | POA: Diagnosis not present

## 2012-01-06 DIAGNOSIS — I4891 Unspecified atrial fibrillation: Secondary | ICD-10-CM | POA: Diagnosis not present

## 2012-01-06 DIAGNOSIS — Z7901 Long term (current) use of anticoagulants: Secondary | ICD-10-CM | POA: Diagnosis not present

## 2012-01-30 ENCOUNTER — Encounter: Payer: Self-pay | Admitting: Internal Medicine

## 2012-01-30 ENCOUNTER — Encounter: Payer: Self-pay | Admitting: Pulmonary Disease

## 2012-01-30 ENCOUNTER — Ambulatory Visit (INDEPENDENT_AMBULATORY_CARE_PROVIDER_SITE_OTHER): Payer: Medicare Other | Admitting: Pulmonary Disease

## 2012-01-30 ENCOUNTER — Other Ambulatory Visit (INDEPENDENT_AMBULATORY_CARE_PROVIDER_SITE_OTHER): Payer: Medicare Other

## 2012-01-30 VITALS — BP 132/84 | HR 62 | Temp 97.0°F | Ht 67.0 in | Wt 193.0 lb

## 2012-01-30 DIAGNOSIS — E039 Hypothyroidism, unspecified: Secondary | ICD-10-CM

## 2012-01-30 DIAGNOSIS — I10 Essential (primary) hypertension: Secondary | ICD-10-CM

## 2012-01-30 DIAGNOSIS — E559 Vitamin D deficiency, unspecified: Secondary | ICD-10-CM

## 2012-01-30 DIAGNOSIS — E7800 Pure hypercholesterolemia, unspecified: Secondary | ICD-10-CM | POA: Diagnosis not present

## 2012-01-30 DIAGNOSIS — D649 Anemia, unspecified: Secondary | ICD-10-CM

## 2012-01-30 DIAGNOSIS — D126 Benign neoplasm of colon, unspecified: Secondary | ICD-10-CM

## 2012-01-30 DIAGNOSIS — I059 Rheumatic mitral valve disease, unspecified: Secondary | ICD-10-CM | POA: Diagnosis not present

## 2012-01-30 DIAGNOSIS — I4891 Unspecified atrial fibrillation: Secondary | ICD-10-CM | POA: Diagnosis not present

## 2012-01-30 DIAGNOSIS — I872 Venous insufficiency (chronic) (peripheral): Secondary | ICD-10-CM | POA: Diagnosis not present

## 2012-01-30 DIAGNOSIS — IMO0001 Reserved for inherently not codable concepts without codable children: Secondary | ICD-10-CM

## 2012-01-30 DIAGNOSIS — G609 Hereditary and idiopathic neuropathy, unspecified: Secondary | ICD-10-CM

## 2012-01-30 LAB — CBC WITH DIFFERENTIAL/PLATELET
Basophils Relative: 0.7 % (ref 0.0–3.0)
Eosinophils Absolute: 0.2 10*3/uL (ref 0.0–0.7)
MCHC: 34 g/dL (ref 30.0–36.0)
MCV: 91.3 fl (ref 78.0–100.0)
Monocytes Absolute: 0.4 10*3/uL (ref 0.1–1.0)
Neutrophils Relative %: 60.7 % (ref 43.0–77.0)
RBC: 4.07 Mil/uL (ref 3.87–5.11)

## 2012-01-30 LAB — HEPATIC FUNCTION PANEL
ALT: 24 U/L (ref 0–35)
AST: 29 U/L (ref 0–37)
Alkaline Phosphatase: 58 U/L (ref 39–117)
Bilirubin, Direct: 0.1 mg/dL (ref 0.0–0.3)
Total Bilirubin: 0.5 mg/dL (ref 0.3–1.2)

## 2012-01-30 LAB — LIPID PANEL
LDL Cholesterol: 45 mg/dL (ref 0–99)
Total CHOL/HDL Ratio: 3

## 2012-01-30 LAB — BASIC METABOLIC PANEL
Chloride: 108 mEq/L (ref 96–112)
Creatinine, Ser: 1.5 mg/dL — ABNORMAL HIGH (ref 0.4–1.2)
Potassium: 5.1 mEq/L (ref 3.5–5.1)
Sodium: 140 mEq/L (ref 135–145)

## 2012-01-30 MED ORDER — ESOMEPRAZOLE MAGNESIUM 40 MG PO CPDR: 40.0000 mg | DELAYED_RELEASE_CAPSULE | Freq: Every day | ORAL | Status: DC

## 2012-01-30 MED ORDER — PRAVASTATIN SODIUM 40 MG PO TABS: 40.0000 mg | ORAL_TABLET | Freq: Every day | ORAL | Status: DC

## 2012-01-30 MED ORDER — LEVOTHYROXINE SODIUM 125 MCG PO TABS: 125.0000 ug | ORAL_TABLET | Freq: Every day | ORAL | Status: DC

## 2012-01-30 NOTE — Patient Instructions (Addendum)
Today we updated your med list in our EPIC system...    Continue your current medications the same...    We refilled your meds per request...  Today we did your follow up FASTING blood work..    We will call you w/ the result when available...  Stay as active as poss...  For the Diarrhea:    Increase the FIBER in your diet> Fiber One is ok...    Try METAMUCIL or similar supplement....    OK to use the Immodium as needed for watery diarrhea...  We will arrange for a follow up w/ [name]...  Let's plan a routine follow up visit in 55months.Marland KitchenMarland Kitchen

## 2012-01-30 NOTE — Progress Notes (Addendum)
Subjective:    Patient ID: Jill Washington, female    DOB: Dec 07, 1925, 78 y.o.   MRN: [id number]  HPI 78 y/o WF here for a follow up visit... she has multiple medical problems as noted below... She is the sister of Jill Washington. Followed for general medical purposes w/ hx of Empyema & critical illness in 1982 from which she made a nice recovery, HBP, MVP, AFib & SSS w/ pacer, Hyperchol, Hypothy, HH/ IBS/ Colon Polyps, Neuropathy/ FM... she is also followed by [name] for Cards, [name] for GI, & [name] for Neuro...  ~  September 24, 2010:  47mo ROV & post hosp check> adm 11/11 by [facility] right leg cellulitis, failed outpt Keflex given by [facility], treated w/ Unasyn then Augmentin & improved (cult neg), VenDopplers neg for DVT... redness decr, some pain but better, more mobile, etc... had assoc mild RI w/ Creat= 1.09 --> 1.57, and mild anemia w/ Hg= 10.5 --> 11.4 (Fe=54, Ferritin=142, B12=237, Folate>20)... disch on same meds as before + Tramadol for pain... we discussed adding oral B12 ~526mcg/d & we will follow up.  ~  January 22, 2011:  32mo ROV & improved overall- still having some IBS issues & states the Levsin didn't help (Immodium helps the diarrhea);  Offered other med to try vs f/u w/ GI for their review- she has seen [name] & will call for an appt;  She saw [name] 1/12- hx SSS, pacer w/ generator ch in 2006, HBP, PAF on Multaq, neg coronary work up, etc> no med changes made;  We reviewed her med regimen & did fasting blood work today> see prob list below:  ~  July 31, 2011:  70mo ROV & she has been stable overall w/ a few IBS related issues & neuropathy related discomfort;  She saw [name] 8/12 & pacer check was OK, doing well overall & no changes made to her regimen;  We reviewed her prev labs and as she is not fasting today we decided to just recheck her blood work on return...  ~  January 30, 2012:  18mo ROV & Jill Washington is c/o IBS symptoms- abd cramping & tendency toward diarrhea> rec to add Fiber,  Metamucil, Levsin & Immodium prn; f/u w/ [name] esp in light of her c/o incont, leakage...  BP looks good on Aten50 & DiovanHCT;  On Multaq per [facility] & COUMADIN (she is due for f/u [name])...  Chol is ok on Prav40 but TG still sl elev & we reviewed low fat diet + exercise...  We reviewed prob list, meds, xrays and labs> see below for updates >> LABS 4/13:  FLP- chol at goal on Prav40 but TG=182;  Chems- on w/ Creat=1.5 stable;  CBC- wnl;  TSH=1.41         Problem List:  Hx of EMPYEMA (ICD-510.9) - hx of critical illness in 1982 w/ epiglottitis, sepsis, empyema, resp failure w/ ARDS, critical illness myopathy & polyneuropathy- from which she made a remarkable recovery... ~  CXR 1/11 showed mild cardiomeg, right pacemaker, mild basilar scarring, NAD.  HYPERTENSION (ICD-401.9) - on ATENOLOL 50mg /d &  DIOVAN/Hct 160-12.5 daily... She is supposed to monitor BP at home. ~  10/12:  BP today = 130/64, taking meds regularly, tolerating meds well... she denies HA, fatigue, visual changes, CP, palipit, dizziness, syncope, dyspnea, edema, etc... ~  4/13:  BP= 132/84 & she remains largely asymptomatic...  MITRAL VALVE PROLAPSE (ICD-424.0) - no complaints of CP or palpit... cath in 1998 showed norm coronaries + MVP.Marland KitchenMarland Kitchen  ~  NuclearStressTest 8/05 showed inferior wall artifact, no ischemia, EF= 69%... ~  2DEcho 4/06 by [facility] showed mild intermittent prolapse of the ant leaflet of the MV, norm LV size w/ mod ASH... ~  f/u Myoview 11/09 showed norm perfusion w/o infarct, w/o ischemia. ~  repeat 2DEcho 6/10 showed norm LVF, norm valves, DD. ~  repeat 2DEcho 10/11 showed mild conc LVH, norm LVF w/ EF>55% ~  repeat Myoview 11/11 showed no wall motion abn, no ischemia, no changes.  Hx of ATRIAL FIBRILLATION (ICD-427.31) & SICK SINUS SYNDROME (ICD-427.81) - on COUMADIN & MULTAQ 400mg Bid, followed by [facility]... pacemaker placed by [name] in 1998 ([facility]) and followed by him... Generator changed 10/06 and Pacer checked  periodically- doing well...  ~  11/10:  [name] changed Amio to [place] 400mg Bid...  VENOUS INSUFFICIENCY (ICD-459.81) - she follows a low sodium diet, elevates legs, support hose when able... ~  11/11:  Adm w/ right leg cellultis, VenDopplers neg for DVT, resolved w/ Unasyn ==> Augmentin.  HYPERCHOLESTEROLEMIA (ICD-272.0) - on PRAVASTATIN 40mg Qhs + diet & exercise... ~  [place] 9/09 on diet alone showed TChol 268, TG 220, HDL 42, LDL 149... Prav40 started. ~  [place] 1/11 on Prav40 showed TChol 152, TG 109, HDL 48, LDL 82... keep same. ~  [place] 4/12 on Prav40 showed TChol 142, TG 184, HDL 36, LDL 69 ~  [place] 4/13 on Prav40 showed TChol 116, TG 182, HDL 35, LDL 45... Needs better low fat diet.  HYPOTHYROIDISM (ICD-244.9) - on LEVOTHYROID 169mcg/d... she is due for f/u labs... ~  last labs 10/08 by [name] showed TSH= 6.31... dose incr to 125 at that time... ~  labs 9/09 on Levo125 showed TSH= 5.09 ~  labs 1/11 on Levo125 showed TSH= 4.59 ~  Labs 4/12 on Levo125 showed TSH= 1.69 ~  Labs 4/13 on Levo125 showed TSH= 1.41  HIATAL HERNIA (ICD-553.3) - on NEXIUM 40mg  Prn & she is asked to take this daily... last EGD 4/06 showed HH, otherw neg...  POLYP, COLON (ICD-211.3) - last colonoscopy 4/08 by [name] showed divertics, 37mm polyp removed= hyperplastic...  and some spasm- "IBS response" to scope... She states that [place] didn't help her cramping but Immodiium does help the diarrhea... She will f/u w/ [name] ASAP...  FIBROMYALGIA (ICD-729.1) - she uses TRAMADOL 50mg  Prn & METHOCARBAMOL Prn for musc spasm.  VITAMIN D DEFICIENCY (ICD-268.9) - prev on Vit D 50K weekly & switched to 1000 u OTC daily... ~  labs 9/09 showed Vit D level = 16... start Vit D 50. ~  labs 1/11 on VitD 50K showed Vit D level = 34... OK switch to 1000 u daily.  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 ~  11/10:   [name] tried switch Neurontin to Cymbalta for neuropathy symptoms- she reacted to the Cymbalta w/ trembling, weakness, confusion & changed back to GABAPENTIN 300mg  Qd... we discussed Neuro appt > ~  2/11:  Neuro eval [name] w/ EMG/ NCV showing severe length dependent axonal sensorimotor polyneuropathy.  BORDERLINE B12 LEVELS - rec to take Vit B12 orally ~512mcg/d... ~  labs 1/11 showed B12= 267 (211-911) ~  labs 11/11 in hosp showed B12= 237 (she wasn't taking any B12 supplement) & rec to start 552mcg/d. ~  Labs 4/12 showed B12 level = 775, continue oral supplementation...  MILD ANEMIA - Labs have been WNL thru office, but mild anemia found 11/11 hosp for cellulitis... on Coumadin  per [facility]. ~  labs 11/11 in hosp showed Hg= 11.0 ==> 10.5, Fe=54 (22%sat), Ferritin=142 ~  Labs 4/12 showed Hg= 12.9 on MVI & B12 supplementation... ~  Labs 4/13 showed Hg= 12.6  SKIN CANCER, HX OF (ICD-V10.83) - followed by Derm... 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  Health Maintenance - GYN= DrRKaplan but she understood him to say she didn't need f/u... had Mammogram & BMD in ?2006 but she's not sure...  takes Calcium, MVI, Vit D...   Past Surgical History  Procedure Date  . Cervical spine surgery   . Cholecystectomy 03/2005    by Dr. Dalbert Batman  . Total abdominal hysterectomy w/ bilateral salpingoophorectomy     Outpatient Encounter Prescriptions as of 01/30/2012  Medication Sig Dispense Refill  . atenolol (TENORMIN) 50 MG tablet Take 50 mg by mouth daily.      Marland Kitchen dronedarone (MULTAQ) 400 MG tablet Take 400 mg by mouth 2 (two) times daily with a meal.        . esomeprazole (NEXIUM) 40 MG capsule Take 1 capsule (40 mg total) by mouth daily before breakfast.  90 capsule  3  . gabapentin (NEURONTIN) 300 MG capsule Take 1 capsule (300 mg total) by mouth 2 (two) times daily.  180 capsule  5  . hyoscyamine (LEVSIN) 0.125 MG/5ML ELIX Take 125 mcg by mouth every 6 (six) hours as needed.  As needed for abdominal cramping       . levothyroxine (SYNTHROID, LEVOTHROID) 125 MCG tablet Take 1 tablet (125 mcg total) by mouth daily.  90 tablet  3  . loperamide (IMODIUM) 2 MG capsule Take 2 mg by mouth 4 (four) times daily as needed.        . pravastatin (PRAVACHOL) 40 MG tablet Take 1 tablet (40 mg total) by mouth daily.  90 tablet  3  . traMADol (ULTRAM) 50 MG tablet Take 50 mg by mouth 2 (two) times daily. As needed for pain and may take with tylenol      . valsartan-hydrochlorothiazide (DIOVAN-HCT) 160-12.5 MG per tablet Take 1 tablet by mouth daily.  90 tablet  3  . warfarin (COUMADIN) 2 MG tablet Take as directed by Dr. Rollene Fare       . DISCONTD: traMADol (ULTRAM) 50 MG tablet Take 1 tablet (50 mg total) by mouth every 6 (six) hours as needed for pain. As needed for pain and may take with tylenol  100 tablet  3    Allergies  Allergen Reactions  . Iodinated Diagnostic Agents   . Other     Dial Soap    Current Medications, Allergies, Past Medical History, Past Surgical History, Family History, and Social History were reviewed in [employer] record.    Review of Systems        See HPI - all other systems neg except as noted...      The patient complains of decreased hearing, dyspnea on exertion, peripheral edema, muscle weakness, and difficulty walking.  The patient denies anorexia, fever, weight loss, weight gain, vision loss, hoarseness, chest pain, syncope, prolonged cough, headaches, hemoptysis, abdominal pain, melena, hematochezia, severe indigestion/heartburn, hematuria, incontinence, suspicious skin lesions, transient blindness, depression, unusual weight change, abnormal bleeding, enlarged lymph nodes, and angioedema.     Objective:   Physical Exam      WD, WN, 78 y/o WF in NAD... GENERAL:  Alert & oriented; pleasant & cooperative... HEENT:  [place]/AT, EOM-wnl, PERRLA, EACs-clear, TMs-wnl, NOSE-clear, THROAT-clear & wnl. NECK:  Supple w/ fairROM;  no JVD; normal carotid impulses w/o bruits; no thyromegaly or nodules palpated; no lymphadenopathy. CHEST:  Clear to P & A; without wheezes/ rales/ or rhonchi heard... HEART:  Regular Rhythm; pacer, gr 1-2/6 SEM S4 no rubs or S3... ABDOMEN:  Soft & nontender; normal bowel sounds; no organomegaly or masses detected. EXT: without deformities, mod arthritic changes; no varicose veins/ +venous insuffic/ 1+edema. NEURO:  CN's intact;  she has a severe peripheral neuropathy w/ decr sensation in LEs, gait abn, decr DTRs. DERM:  dry skin & port-wine birthmark on left chest...  RADIOLOGY DATA:  Reviewed in the EPIC EMR & discussed w/ the patient...  LABORATORY DATA:  Reviewed in the EPIC EMR & discussed w/ the patient...   Assessment & Plan:   PULM>  She remains stable w/o resp exac, etc...  HBP>  Good control on Aten + Diovan/ HCT, asked to do better job w/ calorie & sodium restriction, get wt down, monitor BP at home etc...  CARDS>  Followed by [name] w/ PAF, Pacer for SSS, etc;  Stable on current regimen w/ Multaq, Coumadin, etc;  Continue same...  CHOL>  Chol OK on the Prav40 but incr TG & we discussed need for better low fat diet, get wt down, etc...  HYPOTHY>  Stable on the Synthroid 155mcg/d, continue same...  Neuropathy>  She hasn't followed up w/ Neuro [name]; discussed slowly inct the Gabapentin dose if nec (currently 300mg  Qhs)...  Anemia/ B12>  Her anemia is improved w/ Hg 12.6, and B12 is now normal on oral supplement.Marland KitchenMarland Kitchen

## 2012-02-03 DIAGNOSIS — I4891 Unspecified atrial fibrillation: Secondary | ICD-10-CM | POA: Diagnosis not present

## 2012-02-03 DIAGNOSIS — Z7901 Long term (current) use of anticoagulants: Secondary | ICD-10-CM | POA: Diagnosis not present

## 2012-02-17 DIAGNOSIS — J209 Acute bronchitis, unspecified: Secondary | ICD-10-CM | POA: Diagnosis not present

## 2012-02-20 ENCOUNTER — Other Ambulatory Visit: Payer: Self-pay | Admitting: Cardiovascular Disease

## 2012-02-20 ENCOUNTER — Other Ambulatory Visit: Payer: Self-pay | Admitting: Obstetrics & Gynecology

## 2012-02-20 DIAGNOSIS — R6889 Other general symptoms and signs: Secondary | ICD-10-CM | POA: Diagnosis not present

## 2012-02-20 DIAGNOSIS — Z01818 Encounter for other preprocedural examination: Secondary | ICD-10-CM | POA: Diagnosis not present

## 2012-02-20 DIAGNOSIS — Z79899 Other long term (current) drug therapy: Secondary | ICD-10-CM | POA: Diagnosis not present

## 2012-02-20 DIAGNOSIS — D689 Coagulation defect, unspecified: Secondary | ICD-10-CM | POA: Diagnosis not present

## 2012-02-20 DIAGNOSIS — I10 Essential (primary) hypertension: Secondary | ICD-10-CM | POA: Diagnosis not present

## 2012-02-20 DIAGNOSIS — R5383 Other fatigue: Secondary | ICD-10-CM | POA: Diagnosis not present

## 2012-02-20 DIAGNOSIS — I4891 Unspecified atrial fibrillation: Secondary | ICD-10-CM | POA: Diagnosis not present

## 2012-02-20 DIAGNOSIS — I495 Sick sinus syndrome: Secondary | ICD-10-CM | POA: Diagnosis not present

## 2012-02-24 DIAGNOSIS — Z45018 Encounter for adjustment and management of other part of cardiac pacemaker: Secondary | ICD-10-CM | POA: Diagnosis not present

## 2012-02-24 MED ORDER — SODIUM CHLORIDE 0.9 % IR SOLN
80.0000 mg | Status: AC
  Filled 2012-02-24: qty 2

## 2012-02-24 MED ORDER — CEFAZOLIN SODIUM-DEXTROSE 2-3 GM-% IV SOLR
2.0000 g | INTRAVENOUS | Status: AC
  Filled 2012-02-24: qty 50

## 2012-02-25 ENCOUNTER — Ambulatory Visit (HOSPITAL_COMMUNITY)
Admission: RE | Admit: 2012-02-25 | Discharge: 2012-02-25 | Disposition: A | Discharge status: Home / Self Care | Payer: Medicare Other | Source: Ambulatory Visit | Attending: Cardiovascular Disease | Admitting: Cardiovascular Disease

## 2012-02-25 ENCOUNTER — Ambulatory Visit (HOSPITAL_COMMUNITY): Payer: Medicare Other

## 2012-02-25 ENCOUNTER — Encounter (HOSPITAL_COMMUNITY)
Admission: RE | Disposition: A | Discharge status: Home / Self Care | Payer: Self-pay | Source: Ambulatory Visit | Attending: Cardiovascular Disease

## 2012-02-25 DIAGNOSIS — I517 Cardiomegaly: Secondary | ICD-10-CM | POA: Diagnosis not present

## 2012-02-25 DIAGNOSIS — Z45018 Encounter for adjustment and management of other part of cardiac pacemaker: Secondary | ICD-10-CM | POA: Diagnosis not present

## 2012-02-25 DIAGNOSIS — I4891 Unspecified atrial fibrillation: Secondary | ICD-10-CM | POA: Diagnosis not present

## 2012-02-25 DIAGNOSIS — I495 Sick sinus syndrome: Secondary | ICD-10-CM | POA: Diagnosis not present

## 2012-02-25 DIAGNOSIS — J9819 Other pulmonary collapse: Secondary | ICD-10-CM | POA: Diagnosis not present

## 2012-02-25 HISTORY — PX: PACEMAKER GENERATOR CHANGE: SHX5481

## 2012-02-25 SURGERY — PACEMAKER GENERATOR CHANGE
Anesthesia: LOCAL

## 2012-02-25 MED ORDER — CHLORHEXIDINE GLUCONATE 4 % EX LIQD
60.0000 mL | Freq: Once | CUTANEOUS | Status: DC
Start: 2012-02-25 — End: 2012-02-25
  Filled 2012-02-25: qty 60

## 2012-02-25 MED ORDER — MIDAZOLAM HCL 2 MG/2ML IJ SOLN
INTRAMUSCULAR | Status: AC
  Filled 2012-02-25: qty 2

## 2012-02-25 MED ORDER — MUPIROCIN 2 % EX OINT
TOPICAL_OINTMENT | CUTANEOUS | Status: AC
  Filled 2012-02-25: qty 22

## 2012-02-25 MED ORDER — SODIUM CHLORIDE 0.9 % IJ SOLN: 3.0000 mL | Freq: Two times a day (BID) | INTRAMUSCULAR | Status: DC

## 2012-02-25 MED ORDER — SODIUM CHLORIDE 0.9 % IJ SOLN: 3.0000 mL | INTRAMUSCULAR | Status: DC | PRN

## 2012-02-25 MED ORDER — LIDOCAINE HCL (PF) 1 % IJ SOLN
INTRAMUSCULAR | Status: AC
  Filled 2012-02-25: qty 60

## 2012-02-25 MED ORDER — SODIUM CHLORIDE 0.9 % IV SOLN: INTRAVENOUS | Status: DC

## 2012-02-25 MED ORDER — FENTANYL CITRATE 0.05 MG/ML IJ SOLN
INTRAMUSCULAR | Status: AC
  Filled 2012-02-25: qty 2

## 2012-02-25 MED ORDER — SODIUM CHLORIDE 0.45 % IV SOLN: INTRAVENOUS | Status: DC

## 2012-02-25 MED ORDER — SODIUM CHLORIDE 0.9 % IV SOLN: 250.0000 mL | INTRAVENOUS | Status: DC

## 2012-02-25 NOTE — H&P (Signed)
  Date of Initial H&P: 02/20/2012  History reviewed, patient examined, no change in status, stable for surgery.  Sanda Klein, MD, [place] [phone] office [phone] pager 02/25/2012 12:25 PM

## 2012-02-25 NOTE — Discharge Instructions (Signed)
Pacemaker Battery Change A pacemaker battery usually lasts 4 to 12 years. Once or twice per year, you will be asked to visit your caregiver to have a full evaluation of your pacemaker. When a battery needs to be replaced, the entire pacemaker is actually replaced so that you can benefit from new circuitry and any new features that have recently been added to pacemakers. Most often, this procedure is very simple because the leads are already in place. After giving medicine to numb the skin, your health care provider makes a cut to reopen the pocket holding the pacemaker and disconnects the old device from its leads. The leads are routinely tested at this time. If they are working okay, the new pacemaker may simply be connected to the existing leads. If there is any problem with the old lead system, it may be wise to replace the lead system while inserting the new pacemaker. There are many things that affect how long a pacemaker battery will last:  Age of the pacemaker.   Number of leads (1, 2 or 3).   Pacemaker work load. If the pacemaker is helping the heart more often, then the battery will not last as long as if the pacemaker does not need to help the heart.   Resistance of the leads. The greater the resistance, the greater the drain on the battery. This can increase as the leads get older or if one or more of the leads does not have the best contact with the heart.   Power (voltage) settings.   The health of the person's heart. If the health of the heart gets worse, then the pacemaker may have to work more often and the setting changed to accommodate these changes.  Your health care provider will be alerted to the fact that it is time to replace the battery during follow-up exams. He or she will check your pacemaker using a small table-top computer, called a programmer, and a wand. The wand is about the same size as a remote control. Your provider puts the wand on your body in the area where the  pacemaker is located. Information from the pacemaker is received about how well your heart is working and the status of the battery. It is not painful, and it usually takes just a few minutes. You will have plenty of time before the battery is fully used up to plan for replacement.  LET YOUR CAREGIVER KNOW ABOUT:   Symptoms of chest pain, trouble breathing, palpitations, lightheadedness, or feelings of an abnormal or irregular heart beat.   Allergies.   Medications taken including herbs, eye drops, over the counter medications, and creams   Use of steroids (by mouth or creams).   Possible pregnancy, if applicable.   Previous problems with anesthetics or Novocaine.   History of blood clots (thrombophlebitis).   History of bleeding or blood problems.   Surgery since your last pacemaker placement.   Other health problems.  RISKS AND COMPLICATIONS These are very uncommon but include:  Bleeding.   Bruising of the skin around where the incision was made.   Pain at the site of the incision.   Pulling apart of the skin at the incision site.   Infection.   Allergic reaction to anesthetics or medicines used during the procedure.  Diabetics may have a temporary increase in their blood sugar after any surgical procedure.  BEFORE THE PROCEDURE  Wash all of the skin around the area of the chest where the pacemaker is located.   Try to remove any loose, scaling skin. Unless advised otherwise, avoid using aspirin, ibuprofen, or naproxen for 3-4 days before the procedure. Ask your caregiver for help with any other medication adjustments before the pacemaker is replaced. Unless advised otherwise, do not eat or drink after midnight on the night before the procedure EXCEPT for drinking water and taking your medications as you normally would. AFTER THE PROCEDURE   A heart monitor and the pacemaker programmer will be used to make sure that the new pacemaker is working properly.   You can go home  after the procedure.   Your caregiver will advise you if you need to have any stitches. They will be removed 5-7 days after the procedure.  HOME CARE INSTRUCTIONS   Keep the incision clean and dry.   Unless advised otherwise, you may shower after carefully covering the incision with plastic wrap that is taped to your chest.   For the first week after the replacement, avoid stretching motions that pull at the incision site and avoid heavy exercise with the arm on the same side as the incision.   Only take over-the-counter or prescription medicines for pain, discomfort, or fever as directed by your caregiver.   Your caregiver will tell you when you will need to next test your pacemaker by telephone or when to return to the office for re-exam and/or removal of stitches, if necessary.  SEEK MEDICAL CARE IF:   You have unusual pain at the incision site that is not adequately helped by over-the-counter or prescription medicine.   There is drainage or pus from the incision site.   You develop red streaking that extends above or below the incision site.   You feel brief intermittent palpitations, lightheadedness or any symptoms that you feel might be related to your heart.  SEEK IMMEDIATE MEDICAL CARE IF:   You experience chest pain that is different than the pain at the incision site.   You experience:   Shortness of breath.   Palpitations.   Irregular heart beat.   Lightheadedness that does not go away quickly.   Fainting.   You develop a fever.   You have pain that gets worse even though you are taking pain medicine.  MAKE SURE YOU:   Understand these instructions.   Will watch your condition.   Will get help right away if you are not doing well or get worse.  Document Released: 01/08/2007 Document Revised: 09/19/2011 Document Reviewed: 04/13/2007 [employer] Patient Information 2012 [employer]. 

## 2012-02-25 NOTE — CV Procedure (Signed)
Jill Washington, Jill Washington Female, 78 y.o., [birth year] MRN: [id number]  CSN: [id number]   Procedure report  Procedure performed:  1. Dual-chamber permanent pacemaker generator changeout  2. Light sedation  Reason for procedure:  1. Device generator at elective replacement interval  Procedure performed by:  Sanda Klein, MD  Complications:  None  Estimated blood loss:  <5 mL  Medications administered during procedure:  Ancef 2 g intravenously,lidocaine 1% 30 mL locally, fentanyl 25 mcg intravenously, Versed 1 mg intravenously Device details:   [place] accent DR RF model number PM [id number], serial number [id number] Right atrial lead (chronic) [employer] 1342T, serial [id number] (implanted 05/11/1997) Right ventricular lead (chronic)  [employer] 1346T, serial number [id number] (implanted 05/11/1997) Explanted generator [employer] victory XL DR [id number] serial number  [id number] (implanted 2006)  Procedure details:  After the risks and benefits of the procedure were discussed the patient provided informed consent. She was brought to the cardiac catheter lab in the fasting state. The patient was prepped and draped in usual sterile fashion. Local anesthesia with 1% lidocaine was administered to to the left infraclavicular area. A 5-6cm horizontal incision was made parallel with and 2-3 cm caudal to the right clavicle, in the area of an old scar. An older scar was seen closer to the right clavicle. Using sharp and blunt dissection the prepectoral pocket was opened carefully to avoid injury to thet loops of chronic leads. Extensive dissection was necessary. Electrocautery was not used in this pacemaker dependent patient. The device was explanted. The pocket was carefully inspected for hemostasis and flushed with copious amounts of antibiotic solution.  The leads were disconnected from the old generator and the ventricular lead was immediately connected to the new generator.  There was no underlying ventricular escape rhythm. Testing of the lead parameters via telemetry showed excellent values. appropriate pacing noted.   The entire system was then carefully inserted in the pocket with care  taken that the leads and device assume a comfortable position without pressure on the incision. Great care was taken that the leads be located deep to the generator. The pocket was then closed in layers using 2 layers of 2-0 Vicryl and one layer of 4-0 Vicryl after which Steri-Strips and a sterile dressing was applied.   At the end of the procedure the following lead parameters were encountered:   Right atrial lead sensed P waves 3 mV, impedance 380 ohms, threshold 0 point at 0.4 ms pulse width.  Right ventricular lead sensed R waves  Not detected, impedance 550 ohms, threshold 1 at 0.4 ms pulse width.  Sanda Klein, MD, [place] [phone] office [phone] pager 02/25/2012 1:15 PM

## 2012-02-26 ENCOUNTER — Telehealth: Payer: Self-pay | Admitting: Pulmonary Disease

## 2012-02-26 MED ORDER — TRAMADOL HCL 50 MG PO TABS: 50.0000 mg | ORAL_TABLET | Freq: Two times a day (BID) | ORAL | Status: DC

## 2012-02-26 NOTE — Telephone Encounter (Signed)
Refill of the tramadol has been sent to the pharmacy

## 2012-03-02 ENCOUNTER — Ambulatory Visit (INDEPENDENT_AMBULATORY_CARE_PROVIDER_SITE_OTHER): Payer: Medicare Other | Admitting: Internal Medicine

## 2012-03-02 ENCOUNTER — Encounter: Payer: Self-pay | Admitting: Internal Medicine

## 2012-03-02 VITALS — BP 134/80 | HR 64 | Ht 67.0 in | Wt 191.2 lb

## 2012-03-02 DIAGNOSIS — Z7901 Long term (current) use of anticoagulants: Secondary | ICD-10-CM | POA: Diagnosis not present

## 2012-03-02 DIAGNOSIS — R197 Diarrhea, unspecified: Secondary | ICD-10-CM

## 2012-03-02 DIAGNOSIS — I4891 Unspecified atrial fibrillation: Secondary | ICD-10-CM | POA: Diagnosis not present

## 2012-03-02 DIAGNOSIS — R159 Full incontinence of feces: Secondary | ICD-10-CM

## 2012-03-02 DIAGNOSIS — N3945 Continuous leakage: Secondary | ICD-10-CM | POA: Diagnosis not present

## 2012-03-02 DIAGNOSIS — Z86010 Personal history of colonic polyps: Secondary | ICD-10-CM

## 2012-03-02 DIAGNOSIS — K589 Irritable bowel syndrome without diarrhea: Secondary | ICD-10-CM

## 2012-03-02 NOTE — Progress Notes (Addendum)
Subjective:    Patient ID: Jill Washington, female    DOB: 1926-08-08, 78 y.o.   MRN: [id number]  HPI This is a very pleasant elderly white woman who presents with worsening diarrhea. She has known IBS and has spells of diarrhea off and on with weeks or months where she has regular normal bowel movements. However over the last 2-3 months she's had more watery stools progressive throughout the day with smaller volume and in size at end up being just yellow mucous at the end of the day. She is no longer having normal stools at times. She does not have much abdominal pain. She has chronic intermittent fecal incontinence and now has chronic staining of feces on a pad that she wears. There is also chronic urinary incontinence occurs continuously. Over the years she is to have stress urinary incontinence but his progress. Her last child was 10 pounds and she believes she had some birth trauma with that. She was told not to have more babies and 1 physician told her she should have had a C-section with her last child during 1 gynecologic exam.  Colonoscopy in 2008 and a 12 mm right colon polyp that was hyperplastic.  GI review of systems otherwise notable for some gas and bloating at times, some heartburn problems though Nexium controls that. She notes that Nexium causes diarrhea though she really has been on Nexium for years and has had chronic intermittent diarrhea and IBS pattern until these recent changes.  In the winter she took a course of antibiotics for bronchitis and then again 2 weeks ago.  Allergies  Allergen Reactions  . Iodinated Diagnostic Agents   . Other Itching    Dial Soap   Outpatient Prescriptions Prior to Visit  Medication Sig Dispense Refill  . atenolol (TENORMIN) 50 MG tablet Take 50 mg by mouth daily.      Marland Kitchen dronedarone (MULTAQ) 400 MG tablet Take 400 mg by mouth 2 (two) times daily with a meal.        . esomeprazole (NEXIUM) 40 MG capsule Take 1 capsule (40 mg total) by mouth  daily before breakfast.  90 capsule  3  . gabapentin (NEURONTIN) 300 MG capsule Take 1 capsule (300 mg total) by mouth 2 (two) times daily.  180 capsule  5  . hyoscyamine (LEVSIN) 0.125 MG/5ML ELIX Take 125 mcg by mouth every 6 (six) hours as needed. As needed for abdominal cramping       . levothyroxine (SYNTHROID, LEVOTHROID) 125 MCG tablet Take 1 tablet (125 mcg total) by mouth daily.  90 tablet  3  . loperamide (IMODIUM) 2 MG capsule Take 2 mg by mouth 4 (four) times daily as needed.        . pravastatin (PRAVACHOL) 40 MG tablet Take 1 tablet (40 mg total) by mouth daily.  90 tablet  3  . traMADol (ULTRAM) 50 MG tablet Take 1 tablet (50 mg total) by mouth 2 (two) times daily. As needed for pain and may take with tylenol  60 tablet  5  . warfarin (COUMADIN) 2 MG tablet Take 1/2 a tablet on Monday and Friday. Take 1 tablet the rest of the week.       Past Medical History  Diagnosis Date  . History of empyema of pleura   . Hypertension   . Mitral valve prolapse   . History of atrial fibrillation   . Sick sinus syndrome   . Venous insufficiency   . Hypercholesteremia   .  Hypothyroidism   . Hiatal hernia   . IBS (irritable bowel syndrome)   . Colon polyps   . Fibromyalgia   . Vitamin d deficiency   . Peripheral neuropathy   . Critical illness polyneuropathy   . History of skin cancer     face  . Diverticulosis of sigmoid colon 02/05/2007    mild  . GERD (gastroesophageal reflux disease)   . Pacemaker   . Arthritis   . History of gallstones    Past Surgical History  Procedure Date  . Cervical spine surgery   . Cholecystectomy 03/2005    by Dr. Dalbert Batman  . Total abdominal hysterectomy w/ bilateral salpingoophorectomy   . Colonoscopy 02/05/2007,02/05/05    Dr. Silvano Rusk  . Esophagogastroduodenoscopy 02/05/2005,01/22/90    Dr. Silvano Rusk, Dr. Rachelle Hora  . Pacemaker implanted 05/11/97,08/02/05  . Appendectomy 1971   History   Social History  . Marital Status: Widowed          Number of Children: 3  .     Occupational History  . retired    Social History Main Topics  . Smoking status: Former Smoker -- 2.0 packs/day for 6 years    Types: Cigarettes    Quit date: 10/15/1951  . Smokeless tobacco: Never Used  . Alcohol Use: No  . Drug Use: No    Social History Narrative   She is a widow, one son to daughters. She is retired.   Family History  Problem Relation Age of Onset  . Pancreatic cancer Mother   . Stomach cancer Maternal Aunt   . Heart disease Brother     x 2  . Gallbladder disease Father   . Diabetes Mother   . Diabetes Father   . Diabetes Brother     X 2   . Diabetes Sister     x 2      Review of Systems This is positive for those things mentioned in the history of present illness. She has some chronic back pain and joint pains, recent cough of bronchitis, fatigue, reduced hearing, recent pacemaker change, she takes warfarin, she has some muscle pains. All other systems are negative.    Objective:   Physical Exam General:  Well-developed, well-nourished and in no acute distress Eyes:  anicteric. ENT:   Mouth and posterior pharynx free of lesions. + dentures Neck:   supple w/o thyromegaly or mass.  Lungs: Clear to auscultation bilaterally. Heart:  S1S2, no rubs, murmurs, gallops. Abdomen:  soft, non-tender, no hepatosplenomegaly, hernia, or mass and BS+.  Rectal: With female staff present - normal anoderm, decreased resting tone, good voluntary squeeze, appropriate abdominal wall contraction with normal descent on simulated defecation (all in left lateral) Lymph:  no cervical or supraclavicular adenopathy. Extremities:   no edema Skin   no acute, chronic erythema on hands/knuckles, sun damage lesions abundant, small tick removed from right shoulder - red puncture mark there Neuro:  A&O x 3.  Psych:  appropriate mood and  Affect.   Data Reviewed:  Previous colonoscopy and pathology reports      Assessment & Plan:   1.  Diarrhea   Question if this is an exacerbation of IBS or she has an infection or antibiotic associated problem. Continue Imodium she may use this up to one or 2 every day. It does help. She will have stool for C. difficile, fecal leukocytes and culture. I don't know she's never been tested for celiac disease though she's had a chronic IBS  problem with episodic diarrhea over the years, that may not be needed. She could need a repeat colonoscopy based upon this or it would be reasonable to consider for her history of a right-sided hyperplastic polyp as well though she is 81 and will take that in the consideration. Would have to hold warfarin.   2. Fecal incontinence   She has a lax anal sphincter at rest. Suspect birth trauma. Await urology evaluation could have some associated benefit if she needs pelvic floor exercises. Consider bulking injection of the anorectum, colorectal surgery appointment may be useful for this, when the colorectal surgeon arise in town later this summer.   3. Urinary incontinence with continuous leakage   Urology evaluation will be scheduled   4. IBS (irritable bowel syndrome)   As per diarrhea assessment   5. Personal history of colonic polyps    CC: Noralee Space, MD  , Bjorn Loser, MD   04/21/2012  Dr. Lowella Fairy note of 04/15/2012 indicates that he would not want warfarin held for any elective procedures at this time. Did not indicate if Lovenox window ok.  Gatha Mayer, MD, [facility]

## 2012-03-02 NOTE — Patient Instructions (Signed)
Your physician has requested that you go to the basement for the following lab work before leaving today: C-Diff, Fecal leuk., stool culture  We have referred you to Dr. Bjorn Loser for urinary incontinence. Your appointment is June 10th at 1:15 pm.  They are going to mail paperwork to fill out and take to your appointment.

## 2012-03-06 ENCOUNTER — Other Ambulatory Visit: Payer: Medicare Other

## 2012-03-06 DIAGNOSIS — R197 Diarrhea, unspecified: Secondary | ICD-10-CM | POA: Diagnosis not present

## 2012-03-06 DIAGNOSIS — R159 Full incontinence of feces: Secondary | ICD-10-CM

## 2012-03-10 LAB — STOOL CULTURE

## 2012-03-11 LAB — CLOSTRIDIUM DIFFICILE BY PCR: Toxigenic C. Difficile by PCR: NOT DETECTED

## 2012-03-16 ENCOUNTER — Telehealth: Payer: Self-pay

## 2012-03-16 NOTE — Telephone Encounter (Signed)
Trying to get in touch with pt to see how she is doing and go from there regarding more lab tests.

## 2012-03-17 ENCOUNTER — Telehealth: Payer: Self-pay | Admitting: Pulmonary Disease

## 2012-03-17 MED ORDER — ATENOLOL 50 MG PO TABS: 50.0000 mg | ORAL_TABLET | Freq: Every day | ORAL | Status: DC

## 2012-03-17 MED ORDER — VALSARTAN-HYDROCHLOROTHIAZIDE 160-12.5 MG PO TABS: 1.0000 | ORAL_TABLET | Freq: Every day | ORAL | Status: DC

## 2012-03-17 NOTE — Telephone Encounter (Signed)
RX's have been sent to the pharmacy

## 2012-03-18 ENCOUNTER — Telehealth: Payer: Self-pay

## 2012-03-18 NOTE — Telephone Encounter (Signed)
I have called pt three times in past three days, each time phone message says pt not available, and unable to LM for her to call us back. Have been trying to reach her with stool test results and to see how she is doing.

## 2012-03-19 ENCOUNTER — Telehealth: Payer: Self-pay

## 2012-03-19 NOTE — Telephone Encounter (Signed)
Spoke to pt and let her know that her stool results were negative and that one of the test did not get run, apologized for that.  She reports that she feels better, no fever, still episodes of diarrhea on and off but not every day.  I told pt that I would pass this information on to Dr. Carlean Purl and get back to her with any further plans.

## 2012-03-20 ENCOUNTER — Other Ambulatory Visit: Payer: Self-pay | Admitting: Internal Medicine

## 2012-03-20 ENCOUNTER — Telehealth: Payer: Self-pay

## 2012-03-20 DIAGNOSIS — R197 Diarrhea, unspecified: Secondary | ICD-10-CM

## 2012-03-20 NOTE — Telephone Encounter (Signed)
Please have her complete the fecal leukocytes

## 2012-03-20 NOTE — Telephone Encounter (Signed)
Pt informed that Dr. Carlean Purl wants her to do the fecal leuk test.  Order put in computer, pt hopes to pick up container on Monday.

## 2012-03-23 DIAGNOSIS — N3946 Mixed incontinence: Secondary | ICD-10-CM | POA: Diagnosis not present

## 2012-03-23 DIAGNOSIS — R351 Nocturia: Secondary | ICD-10-CM | POA: Diagnosis not present

## 2012-03-23 NOTE — Telephone Encounter (Signed)
Attempted to contact patient and no answer and no voicemail.

## 2012-03-25 ENCOUNTER — Other Ambulatory Visit: Payer: Medicare Other

## 2012-03-25 DIAGNOSIS — R197 Diarrhea, unspecified: Secondary | ICD-10-CM | POA: Diagnosis not present

## 2012-03-26 LAB — FECAL LACTOFERRIN, QUANT: Lactoferrin: POSITIVE

## 2012-03-26 NOTE — Progress Notes (Signed)
Quick Note:  Fecal leukocytes were present - suggests inflammation but does not tell what is causing that  Is she better or the same/worse? - if not any better i think giving a trial of antibiotics makes sense - take metronidazole 250 mg tid x 10 days - would need to also go to her anti-coag clinic and have INR checked 3-5 days after starting metronidazole as INR may get too long (blood too thin)(  REV in 6 weeks ______

## 2012-03-27 NOTE — Progress Notes (Signed)
Quick Note:  Have her arrange a follow-up in 3-4 weeks ______

## 2012-04-15 DIAGNOSIS — Z79899 Other long term (current) drug therapy: Secondary | ICD-10-CM | POA: Diagnosis not present

## 2012-04-15 DIAGNOSIS — I495 Sick sinus syndrome: Secondary | ICD-10-CM | POA: Diagnosis not present

## 2012-04-15 DIAGNOSIS — R5381 Other malaise: Secondary | ICD-10-CM | POA: Diagnosis not present

## 2012-04-15 DIAGNOSIS — Z7901 Long term (current) use of anticoagulants: Secondary | ICD-10-CM | POA: Diagnosis not present

## 2012-04-15 DIAGNOSIS — I4891 Unspecified atrial fibrillation: Secondary | ICD-10-CM | POA: Diagnosis not present

## 2012-04-25 DIAGNOSIS — N3090 Cystitis, unspecified without hematuria: Secondary | ICD-10-CM | POA: Diagnosis not present

## 2012-04-25 DIAGNOSIS — N3000 Acute cystitis without hematuria: Secondary | ICD-10-CM | POA: Diagnosis not present

## 2012-04-30 ENCOUNTER — Ambulatory Visit (INDEPENDENT_AMBULATORY_CARE_PROVIDER_SITE_OTHER): Payer: Medicare Other | Admitting: Internal Medicine

## 2012-04-30 ENCOUNTER — Encounter: Payer: Self-pay | Admitting: Internal Medicine

## 2012-04-30 VITALS — BP 110/60 | HR 76 | Ht 67.0 in | Wt 184.2 lb

## 2012-04-30 DIAGNOSIS — R197 Diarrhea, unspecified: Secondary | ICD-10-CM

## 2012-04-30 DIAGNOSIS — R109 Unspecified abdominal pain: Secondary | ICD-10-CM | POA: Diagnosis not present

## 2012-04-30 DIAGNOSIS — R1010 Upper abdominal pain, unspecified: Secondary | ICD-10-CM

## 2012-04-30 MED ORDER — MOVIPREP 100 G PO SOLR: ORAL | Status: DC

## 2012-04-30 NOTE — Patient Instructions (Addendum)
You have been scheduled for a colonoscopy with propofol. Please follow written instructions given to you at your visit today.  Please pick up your prep kit at the pharmacy within the next 1-3 days. If you use inhalers (even only as needed), please bring them with you on the day of your procedure.   Thank you for choosing me and [facility].

## 2012-04-30 NOTE — Progress Notes (Signed)
  Subjective:    Patient ID: Jill Washington, female    DOB: [birth year], 78 y.o.   MRN: [id number]  HPI This elderly woman returns with her daughter, one visiting from [place]. She was seen for diarrhea and fecal incontinence within the past couple of months. She eventually obtain a fecal leukocyte test that was positive though a stool culture or C. difficile was negative. A trial of metronidazole might have provided temporary benefit but she is having recurrent loose stools several times a day, urgent, proceeded by upper abdominal pain. She is currently on amoxicillin for what we think was a UTI, as she had an abnormal UA released urinalysis testing in urgent care in the setting of abdominal pain and fever. They then placed her on amoxicillin and within 24-hour the fever subsided.  Medications, allergies, past medical history, past surgical history, family history and social history are reviewed and updated in the EMR.    Review of Systems As per history of present illness    Objective:   Physical Exam General:  NAD Eyes:   anicteric Lungs:  clear Heart:  S1S2 no rubs, murmurs or gallops Abdomen:  soft and tender diffusely but more in lower abdomen, BS+ Ext:   1+ edema         Assessment & Plan:   1. Diarrhea   2. Upper abdominal pain    Though there is a high likelihood of a recurrence of functional abdominal and GI problems here, she did have positive fecal leukocytes and she is not better. I have recommended a colonoscopy. Her cardiologist has not wanted her to stop warfarin with her intermittent atrial fibrillation so we will perform the procedure on warfarin.The risks and benefits as well as alternatives of endoscopic procedure(s) have been discussed and reviewed. All questions answered. The patient agrees to proceed.   Cc: Terance Ice, MD

## 2012-05-07 ENCOUNTER — Encounter: Payer: Self-pay | Admitting: Internal Medicine

## 2012-05-07 ENCOUNTER — Ambulatory Visit (AMBULATORY_SURGERY_CENTER): Payer: Medicare Other | Admitting: Internal Medicine

## 2012-05-07 ENCOUNTER — Telehealth: Payer: Self-pay

## 2012-05-07 VITALS — BP 146/69 | HR 71 | Temp 98.6°F | Resp 15 | Ht 67.0 in | Wt 184.0 lb

## 2012-05-07 DIAGNOSIS — K5730 Diverticulosis of large intestine without perforation or abscess without bleeding: Secondary | ICD-10-CM | POA: Diagnosis not present

## 2012-05-07 DIAGNOSIS — E039 Hypothyroidism, unspecified: Secondary | ICD-10-CM | POA: Diagnosis not present

## 2012-05-07 DIAGNOSIS — D649 Anemia, unspecified: Secondary | ICD-10-CM | POA: Diagnosis not present

## 2012-05-07 DIAGNOSIS — K6289 Other specified diseases of anus and rectum: Secondary | ICD-10-CM

## 2012-05-07 DIAGNOSIS — K644 Residual hemorrhoidal skin tags: Secondary | ICD-10-CM | POA: Diagnosis not present

## 2012-05-07 DIAGNOSIS — R197 Diarrhea, unspecified: Secondary | ICD-10-CM

## 2012-05-07 DIAGNOSIS — I495 Sick sinus syndrome: Secondary | ICD-10-CM | POA: Diagnosis not present

## 2012-05-07 DIAGNOSIS — Z1211 Encounter for screening for malignant neoplasm of colon: Secondary | ICD-10-CM | POA: Diagnosis not present

## 2012-05-07 DIAGNOSIS — I10 Essential (primary) hypertension: Secondary | ICD-10-CM | POA: Diagnosis not present

## 2012-05-07 DIAGNOSIS — D126 Benign neoplasm of colon, unspecified: Secondary | ICD-10-CM | POA: Diagnosis not present

## 2012-05-07 DIAGNOSIS — I4891 Unspecified atrial fibrillation: Secondary | ICD-10-CM | POA: Diagnosis not present

## 2012-05-07 MED ORDER — SODIUM CHLORIDE 0.9 % IV SOLN: 500.0000 mL | INTRAVENOUS | Status: DC

## 2012-05-07 NOTE — Op Note (Signed)
[address] [place], [place]  [id number]  COLONOSCOPY PROCEDURE REPORT  PATIENT:  Jill Washington, Jill Washington  MR#:  [id number] BIRTHDATE:  1926/08/16, 85 yrs. old  GENDER:  female ENDOSCOPIST:  Gatha Mayer, MD, [facility]  PROCEDURE DATE:  05/07/2012 PROCEDURE:  Colonoscopy with biopsy ASA CLASS:  Class III INDICATIONS:  unexplained diarrhea MEDICATIONS:   These medications were titrated to patient response per physician's verbal order, MAC sedation, administered by CRNA, propofol (Diprivan) 180 mg  DESCRIPTION OF PROCEDURE:   After the risks benefits and alternatives of the procedure were thoroughly explained, informed consent was obtained.  Digital rectal exam was performed and revealed decreased sphincter tone and no rectal masses.   The LB [id number] endoscope was introduced through the anus and advanced to the terminal ileum which was intubated for a short distance, without limitations.  The quality of the prep was excellent, using MoviPrep.  The instrument was then slowly withdrawn as the colon was fully examined. <<PROCEDUREIMAGES>>  FINDINGS:  Severe diverticulosis was found in the sigmoid colon. This was otherwise a normal examination of the colon. Fair amount of spasm seen. Random biopsies were obtained and sent to pathology.  The terminal ileum appeared normal.   Retroflexed views in the rectum revealed external hemorrhoids.    The time to cecum = 5:06 minutes. The scope was  withdrawn in 5:26 minutes from the cecum and the procedure completed. COMPLICATIONS:  None ENDOSCOPIC IMPRESSION: 1) Severe diverticulosis in the sigmoid colon 2) External hemorrhoids 3) Otherwise normal examination of colon and terminal ileum. Random colon biopsies taken. RECOMMENDATIONS: 1) Await biopsy results  Gatha Mayer, MD, [facility]  CC:  The Patient  n. eSIGNED:   Gatha Mayer at 05/07/2012 11:08 AM  Sharyl Nimrod, [id number]

## 2012-05-07 NOTE — Patient Instructions (Addendum)
The colon lining looked normal but I took biopsies to see if there is inflammation (colitis) only visible by microscope.  You have diverticulosis, external hemorrhoids and reduced anal muscle tone.  I will be in touch next week with results.  Thank you for choosing me and [facility].  Gatha Mayer, MD, [facility]    Handouts were given to your daughter on diverticulosis, high fiber diet and hemorrhoids.  You may resume your medications today.  Please call if any questions or concerns.      YOU HAD AN ENDOSCOPIC PROCEDURE TODAY AT [facility]: Refer to the procedure report that was given to you for any specific questions about what was found during the examination.  If the procedure report does not answer your questions, please call your gastroenterologist to clarify.  If you requested that your care partner not be given the details of your procedure findings, then the procedure report has been included in a sealed envelope for you to review at your convenience later.  YOU SHOULD EXPECT: Some feelings of bloating in the abdomen. Passage of more gas than usual.  Walking can help get rid of the air that was put into your GI tract during the procedure and reduce the bloating. If you had a lower endoscopy (such as a colonoscopy or flexible sigmoidoscopy) you may notice spotting of blood in your stool or on the toilet paper. If you underwent a bowel prep for your procedure, then you may not have a normal bowel movement for a few days.  DIET: Your first meal following the procedure should be a light meal and then it is ok to progress to your normal diet.  A half-sandwich or bowl of soup is an example of a good first meal.  Heavy or fried foods are harder to digest and may make you feel nauseous or bloated.  Likewise meals heavy in dairy and vegetables can cause extra gas to form and this can also increase the bloating.  Drink plenty of fluids but you should avoid alcoholic  beverages for 24 hours.  ACTIVITY: Your care partner should take you home directly after the procedure.  You should plan to take it easy, moving slowly for the rest of the day.  You can resume normal activity the day after the procedure however you should NOT DRIVE or use heavy machinery for 24 hours (because of the sedation medicines used during the test).    SYMPTOMS TO REPORT IMMEDIATELY: A gastroenterologist can be reached at any hour.  During normal business hours, 8:30 AM to 5:00 PM Monday through Friday, call [phone].  After hours and on weekends, please call the GI answering service at [phone] who will take a message and have the physician on call contact you.   Following lower endoscopy (colonoscopy or flexible sigmoidoscopy):  Excessive amounts of blood in the stool  Significant tenderness or worsening of abdominal pains  Swelling of the abdomen that is new, acute  Fever of 100F or higher    FOLLOW UP: If any biopsies were taken you will be contacted by phone or by letter within the next 1-3 weeks.  Call your gastroenterologist if you have not heard about the biopsies in 3 weeks.  Our staff will call the home number listed on your records the next business day following your procedure to check on you and address any questions or concerns that you may have at that time regarding the information given to you following your  procedure. This is a courtesy call and so if there is no answer at the home number and we have not heard from you through the emergency physician on call, we will assume that you have returned to your regular daily activities without incident.  SIGNATURES/CONFIDENTIALITY: You and/or your care partner have signed paperwork which will be entered into your electronic medical record.  These signatures attest to the fact that that the information above on your After Visit Summary has been reviewed and is understood.  Full responsibility of the confidentiality  of this discharge information lies with you and/or your care-partner.

## 2012-05-07 NOTE — Telephone Encounter (Signed)
16:50 phone call sent to the recovery room from Jill Washington, pt's daughter.  She called 911 and the medic assessed her mother per the daughter.  She said her mother had pink cole and not shaking now.  She did not have difficuliity breathing either.  Jill Washington said her mother did not want to go to the ER now.  I advised her Dr. Carlean Purl did recommend the pt go to the ER and we do not know why she had the previous sx including brownish, red color stool.  I again advised her to go to the ER.  The pt's daughter said she would watch her mother and if she had any other sx, she would then take her to the ER.  Message sent to Dr. Carlean Purl.

## 2012-05-07 NOTE — Telephone Encounter (Signed)
The pt asked when Dr. Carlean Purl calls her with the bx results to call her cell !st at [phone].  If she does not answer then call her sister, [name] at [phone] and if she is not available to call her daughter, Vernie Shanks, at [phone].  I spoke with Dr. Carlean Purl about this and he recommended sending a note to him.  Done so. maw

## 2012-05-07 NOTE — Telephone Encounter (Signed)
Pt's daughter called in at 15:50 stating her mother ate on the way home and went to sleep when she got home.  Her daughter said her mother woke up around 15:20 and said she was cold to touch, temp 100 orally.  She was shaking and having difficuility breathing.  There was brownish red color in her stool.  I spoke with Dr. Carlean Purl and he said for the pt to go to the ER.  I called the pt's daughter back and advised her to go to the ER and she said she would take her now.

## 2012-05-07 NOTE — Progress Notes (Addendum)
Pressure applied to the abdomen to reach the cecum  Sputtering a little with secretions, jaw thrust done by S. Camp, Immunologist. Suctioning of secretions of the mouth done. Sats maintained 90-98%

## 2012-05-07 NOTE — Progress Notes (Signed)
No complaints noted in the recovery room. Maw   

## 2012-05-08 ENCOUNTER — Telehealth: Payer: Self-pay

## 2012-05-08 DIAGNOSIS — N3946 Mixed incontinence: Secondary | ICD-10-CM | POA: Diagnosis not present

## 2012-05-08 DIAGNOSIS — R351 Nocturia: Secondary | ICD-10-CM | POA: Diagnosis not present

## 2012-05-08 NOTE — Telephone Encounter (Signed)
  Follow up Call-  Call back number 05/07/2012  Post procedure Call Back phone  # [phone]  Permission to leave phone message Yes     Patient questions:  Do you have a fever, pain , or abdominal swelling? no Pain Score  0 *  Have you tolerated food without any problems? yes  Have you been able to return to your normal activities? yes  Do you have any questions about your discharge instructions: Diet   no Medications  no Follow up visit  no  Do you have questions or concerns about your Care? no  Actions: * If pain score is 4 or above: No action needed, pain <4. Stated "I'm heading into the kidney place right now to get my kidneys checked.  The ambulance driver said he thought I have a touch of pneumonia so I have continued to take my antibiotics, but I feel great."

## 2012-05-13 NOTE — Progress Notes (Signed)
Quick Note:  Normal colon mucosa ______

## 2012-05-18 ENCOUNTER — Telehealth: Payer: Self-pay | Admitting: Pulmonary Disease

## 2012-05-18 NOTE — Telephone Encounter (Signed)
lmomtcb x1 

## 2012-05-19 MED ORDER — LEVOTHYROXINE SODIUM 125 MCG PO TABS: 125.0000 ug | ORAL_TABLET | Freq: Every day | ORAL | Status: DC

## 2012-05-19 MED ORDER — TRAMADOL HCL 50 MG PO TABS
50.0000 mg | ORAL_TABLET | Freq: Three times a day (TID) | ORAL | Status: DC | PRN
Start: 2012-05-19 — End: 2012-07-31

## 2012-05-19 NOTE — Telephone Encounter (Signed)
Per SN---ok to send in the tramadol 50 mg  #180   1 po every 8 hours prn pain with 3 refills. thanks

## 2012-05-19 NOTE — Telephone Encounter (Signed)
[facility] sent [facility] --- lmomtcb

## 2012-05-19 NOTE — Telephone Encounter (Signed)
Called # provided above - someone picked up phone but I could not hear anyone speaking.  I called back - received VM - lmomtcb

## 2012-05-19 NOTE — Telephone Encounter (Signed)
Pt returned call. Jill Washington  

## 2012-05-19 NOTE — Telephone Encounter (Signed)
Called, spoke with pt who states she is completely out of levothryoxine.  States she is waiting for medication to come in from [place] but this will take 10 days.  She is requesting a 30 day supply to be sent to [facility].  Rx sent -- pt aware.  Also, pt states she would like tramadol 50 mg rx sent to [facility] for 90 days.  Pt states she can get this through [facility] without having to pay vs paying for rx at [facility].  Dr. Lenna Gilford, pls advise if ok to send this as requested.  Thank you.

## 2012-05-19 NOTE — Telephone Encounter (Signed)
Returning call can be reached at [phone] says ok to leave msg w sister.Jill Washington

## 2012-05-20 NOTE — Telephone Encounter (Signed)
lmomtcb x 2  

## 2012-05-20 NOTE — Telephone Encounter (Signed)
Pt returned call. I advised her that rx for tramadol had been sent in; read her the instructions and advised there were 3 refills. "Thanks" and nothing further needed per pt. I will close this encounter. Mariann Laster

## 2012-05-21 DIAGNOSIS — I10 Essential (primary) hypertension: Secondary | ICD-10-CM | POA: Diagnosis not present

## 2012-05-21 DIAGNOSIS — Z7901 Long term (current) use of anticoagulants: Secondary | ICD-10-CM | POA: Diagnosis not present

## 2012-05-21 DIAGNOSIS — R5383 Other fatigue: Secondary | ICD-10-CM | POA: Diagnosis not present

## 2012-05-21 DIAGNOSIS — Z79899 Other long term (current) drug therapy: Secondary | ICD-10-CM | POA: Diagnosis not present

## 2012-05-21 DIAGNOSIS — I4891 Unspecified atrial fibrillation: Secondary | ICD-10-CM | POA: Diagnosis not present

## 2012-05-21 DIAGNOSIS — I495 Sick sinus syndrome: Secondary | ICD-10-CM | POA: Diagnosis not present

## 2012-05-27 DIAGNOSIS — N3946 Mixed incontinence: Secondary | ICD-10-CM | POA: Diagnosis not present

## 2012-05-27 DIAGNOSIS — R351 Nocturia: Secondary | ICD-10-CM | POA: Diagnosis not present

## 2012-06-02 DIAGNOSIS — L600 Ingrowing nail: Secondary | ICD-10-CM | POA: Diagnosis not present

## 2012-06-02 DIAGNOSIS — L608 Other nail disorders: Secondary | ICD-10-CM | POA: Diagnosis not present

## 2012-06-03 DIAGNOSIS — N3000 Acute cystitis without hematuria: Secondary | ICD-10-CM | POA: Diagnosis not present

## 2012-06-09 ENCOUNTER — Other Ambulatory Visit: Payer: Self-pay | Admitting: Dermatology

## 2012-06-09 DIAGNOSIS — L578 Other skin changes due to chronic exposure to nonionizing radiation: Secondary | ICD-10-CM | POA: Diagnosis not present

## 2012-06-09 DIAGNOSIS — C44621 Squamous cell carcinoma of skin of unspecified upper limb, including shoulder: Secondary | ICD-10-CM | POA: Diagnosis not present

## 2012-06-09 DIAGNOSIS — L570 Actinic keratosis: Secondary | ICD-10-CM | POA: Diagnosis not present

## 2012-06-18 ENCOUNTER — Telehealth: Payer: Self-pay | Admitting: Pulmonary Disease

## 2012-06-18 DIAGNOSIS — Z7901 Long term (current) use of anticoagulants: Secondary | ICD-10-CM | POA: Diagnosis not present

## 2012-06-18 DIAGNOSIS — I4891 Unspecified atrial fibrillation: Secondary | ICD-10-CM | POA: Diagnosis not present

## 2012-06-18 MED ORDER — LEVOTHYROXINE SODIUM 125 MCG PO TABS: 125.0000 ug | ORAL_TABLET | Freq: Every day | ORAL | Status: DC

## 2012-06-18 NOTE — Telephone Encounter (Signed)
I spoke with the pt daughter and the pt needs a refill [facility] for synthroid, but also a 30 day supply to [facility] because she is out of the medication. Refills sent. Pt is aware. [place] Bing, CMA

## 2012-06-18 NOTE — Telephone Encounter (Signed)
lmomtcb x1 for pt 

## 2012-06-23 ENCOUNTER — Telehealth: Payer: Self-pay | Admitting: Pulmonary Disease

## 2012-06-23 MED ORDER — VALSARTAN-HYDROCHLOROTHIAZIDE 160-12.5 MG PO TABS: 1.0000 | ORAL_TABLET | Freq: Every day | ORAL | Status: DC

## 2012-06-23 NOTE — Telephone Encounter (Signed)
Spoke with pt to verify the msg. She advised needs 10 day supply valsartan/hct sent to [facility] while she awaits her mailorder rx. Rx was sent. Nothing further needed per pt.

## 2012-07-03 DIAGNOSIS — N3946 Mixed incontinence: Secondary | ICD-10-CM | POA: Diagnosis not present

## 2012-07-03 DIAGNOSIS — R351 Nocturia: Secondary | ICD-10-CM | POA: Diagnosis not present

## 2012-07-17 DIAGNOSIS — Z7901 Long term (current) use of anticoagulants: Secondary | ICD-10-CM | POA: Diagnosis not present

## 2012-07-17 DIAGNOSIS — I4891 Unspecified atrial fibrillation: Secondary | ICD-10-CM | POA: Diagnosis not present

## 2012-07-24 DIAGNOSIS — I4891 Unspecified atrial fibrillation: Secondary | ICD-10-CM | POA: Diagnosis not present

## 2012-07-24 DIAGNOSIS — Z7901 Long term (current) use of anticoagulants: Secondary | ICD-10-CM | POA: Diagnosis not present

## 2012-07-30 ENCOUNTER — Encounter: Payer: Self-pay | Admitting: *Deleted

## 2012-07-31 ENCOUNTER — Ambulatory Visit (INDEPENDENT_AMBULATORY_CARE_PROVIDER_SITE_OTHER): Payer: Medicare Other | Admitting: Pulmonary Disease

## 2012-07-31 ENCOUNTER — Encounter: Payer: Self-pay | Admitting: Pulmonary Disease

## 2012-07-31 VITALS — BP 110/72 | HR 96 | Temp 97.2°F | Ht 67.0 in | Wt 184.6 lb

## 2012-07-31 DIAGNOSIS — Z23 Encounter for immunization: Secondary | ICD-10-CM

## 2012-07-31 DIAGNOSIS — K589 Irritable bowel syndrome without diarrhea: Secondary | ICD-10-CM

## 2012-07-31 DIAGNOSIS — I10 Essential (primary) hypertension: Secondary | ICD-10-CM | POA: Diagnosis not present

## 2012-07-31 DIAGNOSIS — E039 Hypothyroidism, unspecified: Secondary | ICD-10-CM

## 2012-07-31 DIAGNOSIS — I872 Venous insufficiency (chronic) (peripheral): Secondary | ICD-10-CM

## 2012-07-31 DIAGNOSIS — I495 Sick sinus syndrome: Secondary | ICD-10-CM | POA: Diagnosis not present

## 2012-07-31 DIAGNOSIS — K449 Diaphragmatic hernia without obstruction or gangrene: Secondary | ICD-10-CM

## 2012-07-31 DIAGNOSIS — E7800 Pure hypercholesterolemia, unspecified: Secondary | ICD-10-CM

## 2012-07-31 DIAGNOSIS — I4891 Unspecified atrial fibrillation: Secondary | ICD-10-CM

## 2012-07-31 DIAGNOSIS — I059 Rheumatic mitral valve disease, unspecified: Secondary | ICD-10-CM

## 2012-07-31 DIAGNOSIS — D126 Benign neoplasm of colon, unspecified: Secondary | ICD-10-CM

## 2012-07-31 DIAGNOSIS — G609 Hereditary and idiopathic neuropathy, unspecified: Secondary | ICD-10-CM

## 2012-07-31 DIAGNOSIS — Z950 Presence of cardiac pacemaker: Secondary | ICD-10-CM

## 2012-07-31 DIAGNOSIS — IMO0001 Reserved for inherently not codable concepts without codable children: Secondary | ICD-10-CM

## 2012-07-31 MED ORDER — TRAMADOL HCL 50 MG PO TABS: 50.0000 mg | ORAL_TABLET | Freq: Two times a day (BID) | ORAL | Status: DC | PRN

## 2012-07-31 NOTE — Patient Instructions (Addendum)
Today we updated your med list in our EPIC system...    Continue your current medications the same...  We wrote another prescription for TRAMADOL to take as needed for pain...   Stay as active as possible...    Call for any problems...  Let's plan a follow up visit in 6  Month's time w/ FASTING blood work.Marland KitchenMarland Kitchen

## 2012-08-07 DIAGNOSIS — I4891 Unspecified atrial fibrillation: Secondary | ICD-10-CM | POA: Diagnosis not present

## 2012-08-07 DIAGNOSIS — Z7901 Long term (current) use of anticoagulants: Secondary | ICD-10-CM | POA: Diagnosis not present

## 2012-08-12 DIAGNOSIS — N3946 Mixed incontinence: Secondary | ICD-10-CM | POA: Diagnosis not present

## 2012-08-12 DIAGNOSIS — R351 Nocturia: Secondary | ICD-10-CM | POA: Diagnosis not present

## 2012-08-23 ENCOUNTER — Encounter: Payer: Self-pay | Admitting: Pulmonary Disease

## 2012-08-23 NOTE — Progress Notes (Signed)
Subjective:    Patient ID: Jill Washington, female    DOB: 1926-06-10, 78 y.o.   MRN: [id number]  HPI 78 y/o WF here for a follow up visit... she has multiple medical problems as noted below... She is the sister of [name]. Followed for general medical purposes w/ hx of Empyema & critical illness in 1982 from which she made a nice recovery, HBP, MVP, AFib & SSS w/ pacer, Hyperchol, Hypothy, HH/ IBS/ Colon Polyps, Neuropathy/ FM... she is also followed by [name] for Cards, [name] for GI, & [name] for Neuro...  ~  September 24, 2010:  84mo ROV & post hosp check> adm 11/11 by [facility] right leg cellulitis, failed outpt Keflex given by [facility], treated w/ Unasyn then Augmentin & improved (cult neg), VenDopplers neg for DVT... redness decr, some pain but better, more mobile, etc... had assoc mild RI w/ Creat= 1.09 --> 1.57, and mild anemia w/ Hg= 10.5 --> 11.4 (Fe=54, Ferritin=142, B12=237, Folate>20)... disch on same meds as before + Tramadol for pain... we discussed adding oral B12 ~533mcg/d & we will follow up.  ~  January 22, 2011:  12mo ROV & improved overall- still having some IBS issues & states the Levsin didn't help (Immodium helps the diarrhea);  Offered other med to try vs f/u w/ GI for their review- she has seen [name] & will call for an appt;  She saw [name] 1/12- hx SSS, pacer w/ generator ch in 2006, HBP, PAF on Multaq, neg coronary work up, etc> no med changes made;  We reviewed her med regimen & did fasting blood work today> see prob list below:  ~  July 31, 2011:  50mo ROV & she has been stable overall w/ a few IBS related issues & neuropathy related discomfort;  She saw [name] 8/12 & pacer check was OK, doing well overall & no changes made to her regimen;  We reviewed her prev labs and as she is not fasting today we decided to just recheck her blood work on return...  ~  January 30, 2012:  4mo ROV & [name] is c/o IBS symptoms- abd cramping & tendency toward diarrhea> rec to add Fiber,  Metamucil, Levsin & Immodium prn; f/u w/ [name] esp in light of her c/o incont, leakage...  BP looks good on Aten50 & DiovanHCT;  On Multaq per [facility] & COUMADIN (she is due for f/u [name])...  Chol is ok on Prav40 but TG still sl elev & we reviewed low fat diet + exercise...  We reviewed prob list, meds, xrays and labs> see below for updates >> LABS 4/13:  FLP- chol at goal on Prav40 but TG=182;  Chems- on w/ Creat=1.5 stable;  CBC- wnl;  TSH=1.41  ~  July 31, 2012:  70mo ROV & [name] has a lot of aches & pains- uses the Tramadol + Tylenol w/ some relief...     BP controlled on Aten & DiovanHct, measures 110/72 today & denies CP, palpit, dizzy, SOB, edema;  Known PAF, SSS- pacer on Coumadin & Multaq [facility];  Chol is treated w/ Prav40 & her chol numbers are good, needs better low fat diet & wt reduction;  She is followed by [name] for GI & [name] for Urology- on [facility] for bladder...    We reviewed prob list, meds, xrays and labs> see below for updates >> ok 2013 Flu vaccine today...        Problem List:  Hx of EMPYEMA (ICD-510.9) - hx of critical illness  in 1982 w/ epiglottitis, sepsis, empyema, resp failure w/ ARDS, critical illness myopathy & polyneuropathy- from which she made a remarkable recovery... ~  CXR 1/11 showed mild cardiomeg, right pacemaker, mild basilar scarring, NAD.  HYPERTENSION (ICD-401.9) - on ATENOLOL 50mg /d &  DIOVAN/Hct 160-12.5 daily... She is supposed to monitor BP at home. ~  10/12:  BP today = 130/64, taking meds regularly, tolerating meds well... she denies HA, fatigue, visual changes, CP, palipit, dizziness, syncope, dyspnea, edema, etc... ~  4/13:  BP= 132/84 & she remains largely asymptomatic... ~  10/13:  BP= 110/72 & she remains stable...  MITRAL VALVE PROLAPSE (ICD-424.0) - no complaints of CP or palpit... cath in 1998 showed norm coronaries + MVP...  ~  NuclearStressTest 8/05 showed inferior wall artifact, no ischemia, EF= 69%... ~  2DEcho  4/06 by [facility] showed mild intermittent prolapse of the ant leaflet of the MV, norm LV size w/ mod ASH... ~  f/u Myoview 11/09 showed norm perfusion w/o infarct, w/o ischemia. ~  repeat 2DEcho 6/10 showed norm LVF, norm valves, DD. ~  repeat 2DEcho 10/11 showed mild conc LVH, norm LVF w/ EF>55% ~  repeat Myoview 11/11 showed no wall motion abn, no ischemia, no changes.  Hx of ATRIAL FIBRILLATION (ICD-427.31) & SICK SINUS SYNDROME (ICD-427.81) - on COUMADIN & MULTAQ 400mg Bid, followed by [facility]... pacemaker placed by [name] in 1998 (SSS) and followed by him... Generator changed 10/06 and Pacer checked periodically- doing well...  ~  11/10:  [name] changed Amio to [place] 400mg Bid...  VENOUS INSUFFICIENCY (ICD-459.81) - she follows a low sodium diet, elevates legs, support hose when able... ~  11/11:  Adm w/ right leg cellultis, VenDopplers neg for DVT, resolved w/ Unasyn ==> Augmentin.  HYPERCHOLESTEROLEMIA (ICD-272.0) - on PRAVASTATIN 40mg Qhs + diet & exercise... ~  [place] 9/09 on diet alone showed TChol 268, TG 220, HDL 42, LDL 149... Prav40 started. ~  [place] 1/11 on Prav40 showed TChol 152, TG 109, HDL 48, LDL 82... keep same. ~  [place] 4/12 on Prav40 showed TChol 142, TG 184, HDL 36, LDL 69 ~  FLP 4/13 on Prav40 showed TChol 116, TG 182, HDL 35, LDL 45... Needs better low fat diet.  HYPOTHYROIDISM (ICD-244.9) - on LEVOTHYROID 113mcg/d... she is due for f/u labs... ~  last labs 10/08 by [name] showed TSH= 6.31... dose incr to 125 at that time... ~  labs 9/09 on Levo125 showed TSH= 5.09 ~  labs 1/11 on Levo125 showed TSH= 4.59 ~  Labs 4/12 on Levo125 showed TSH= 1.69 ~  Labs 4/13 on Levo125 showed TSH= 1.41  HIATAL HERNIA (ICD-553.3) - on NEXIUM 40mg  Prn & she is asked to take this daily... last EGD 4/06 showed HH, otherw neg...  POLYP, COLON (ICD-211.3) - last colonoscopy 4/08 by [name] showed divertics, 59mm polyp removed= hyperplastic...  and some spasm- "IBS response" to scope... She states  that [place] didn't help her cramping but Immodiium does help the diarrhea... She will f/u w/ [name] ASAP...  FIBROMYALGIA (ICD-729.1) - she uses TRAMADOL 50mg  Prn & METHOCARBAMOL Prn for musc spasm.  VITAMIN D DEFICIENCY (ICD-268.9) - prev on Vit D 50K weekly & switched to 1000 u OTC daily... ~  labs 9/09 showed Vit D level = 16... start Vit D 50. ~  labs 1/11 on VitD 50K showed Vit D level = 34... OK switch to 1000 u daily.  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 ~  11/10:  [name] tried switch Neurontin to Cymbalta for neuropathy symptoms- she reacted to the Cymbalta w/ trembling, weakness, confusion & changed back to GABAPENTIN 300mg  Qd... we discussed Neuro appt > ~  2/11:  Neuro eval [name] w/ EMG/ NCV showing severe length dependent axonal sensorimotor polyneuropathy.  BORDERLINE B12 LEVELS - rec to take Vit B12 orally ~577mcg/d... ~  labs 1/11 showed B12= 267 (211-911) ~  labs 11/11 in hosp showed B12= 237 (she wasn't taking any B12 supplement) & rec to start 553mcg/d. ~  Labs 4/12 showed B12 level = 775, continue oral supplementation...  MILD ANEMIA - Labs have been WNL thru office, but mild anemia found 11/11 hosp for cellulitis... on Coumadin per [facility]. ~  labs 11/11 in hosp showed Hg= 11.0 ==> 10.5, Fe=54 (22%sat), Ferritin=142 ~  Labs 4/12 showed Hg= 12.9 on MVI & B12 supplementation... ~  Labs 4/13 showed Hg= 12.6  SKIN CANCER, HX OF (ICD-V10.83) - followed by Derm... 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  Health Maintenance - GYN= DrRKaplan but she understood him to say she didn't need f/u... had Mammogram & BMD in ?2006 but she's not sure...  takes Calcium, MVI, Vit D...   Past Surgical History  Procedure Date  . Cervical spine surgery   . Cholecystectomy 03/2005    by Dr. Dalbert Batman  . Total abdominal hysterectomy w/ bilateral  salpingoophorectomy   . Colonoscopy 02/05/2007,02/05/05    Dr. Silvano Rusk  . Esophagogastroduodenoscopy 02/05/2005,01/22/90    Dr. Silvano Rusk, Dr. Rachelle Hora  . Pacemaker implanted 05/11/97,08/02/05  . Appendectomy 1971  . Tonsillectomy     Outpatient Encounter Prescriptions as of 07/31/2012  Medication Sig Dispense Refill  . atenolol (TENORMIN) 50 MG tablet 1 1/ daily      . Cholecalciferol (VITAMIN D PO) Take 1 tablet by mouth daily.      Marland Kitchen dronedarone (MULTAQ) 400 MG tablet Take 400 mg by mouth 2 (two) times daily with a meal.        . esomeprazole (NEXIUM) 40 MG capsule Take 1 capsule (40 mg total) by mouth daily before breakfast.  90 capsule  3  . gabapentin (NEURONTIN) 300 MG capsule Take 1 capsule (300 mg total) by mouth 2 (two) times daily.  180 capsule  5  . hyoscyamine (LEVSIN) 0.125 MG/5ML ELIX Take 125 mcg by mouth every 6 (six) hours as needed. As needed for abdominal cramping       . levothyroxine (SYNTHROID, LEVOTHROID) 125 MCG tablet Take 1 tablet (125 mcg total) by mouth daily.  90 tablet  3  . loperamide (IMODIUM) 2 MG capsule Take 2 mg by mouth 4 (four) times daily as needed.        . mirabegron ER (MYRBETRIQ) 50 MG TB24 Take 50 mg by mouth daily.      . Multiple Vitamins-Minerals (ONE-A-DAY WOMENS 50 PLUS PO) Take 1 tablet by mouth daily.      . pravastatin (PRAVACHOL) 40 MG tablet Take 1 tablet (40 mg total) by mouth daily.  90 tablet  3  . traMADol (ULTRAM) 50 MG tablet Take 1 tablet (50 mg total) by mouth 2 (two) times daily as needed for pain.  60 tablet  3  . valsartan-hydrochlorothiazide (DIOVAN-HCT) 160-12.5 MG per tablet Take 1 tablet by mouth daily.  10 tablet  0  . vitamin B-12 (CYANOCOBALAMIN) 500 MCG tablet Take 500 mcg by mouth daily.      Marland Kitchen warfarin (  COUMADIN) 2 MG tablet Take 1/2 a tablet on Monday and Friday. Take 1 tablet the rest of the week.      . [DISCONTINUED] atenolol (TENORMIN) 50 MG tablet Take 1 tablet (50 mg total) by mouth daily.  90  tablet  3  . [DISCONTINUED] traMADol (ULTRAM) 50 MG tablet Take 1 tablet (50 mg total) by mouth every 8 (eight) hours as needed for pain.  180 tablet  3  . [DISCONTINUED] amoxicillin (AMOXIL) 500 MG capsule Take 500 mg by mouth 3 (three) times daily.        Allergies  Allergen Reactions  . Iodinated Diagnostic Agents   . Other Itching    Dial Soap    Current Medications, Allergies, Past Medical History, Past Surgical History, Family History, and Social History were reviewed in [employer] record.    Review of Systems        See HPI - all other systems neg except as noted...      The patient complains of decreased hearing, dyspnea on exertion, peripheral edema, muscle weakness, and difficulty walking.  The patient denies anorexia, fever, weight loss, weight gain, vision loss, hoarseness, chest pain, syncope, prolonged cough, headaches, hemoptysis, abdominal pain, melena, hematochezia, severe indigestion/heartburn, hematuria, incontinence, suspicious skin lesions, transient blindness, depression, unusual weight change, abnormal bleeding, enlarged lymph nodes, and angioedema.     Objective:   Physical Exam      WD, WN, 78 y/o WF in NAD... GENERAL:  Alert & oriented; pleasant & cooperative... HEENT:  /AT, EOM-wnl, PERRLA, EACs-clear, TMs-wnl, NOSE-clear, THROAT-clear & wnl. NECK:  Supple w/ fairROM; no JVD; normal carotid impulses w/o bruits; no thyromegaly or nodules palpated; no lymphadenopathy. CHEST:  Clear to P & A; without wheezes/ rales/ or rhonchi heard... HEART:  Regular Rhythm; pacer, gr 1-2/6 SEM S4 no rubs or S3... ABDOMEN:  Soft & nontender; normal bowel sounds; no organomegaly or masses detected. EXT: without deformities, mod arthritic changes; no varicose veins/ +venous insuffic/ 1+edema. NEURO:  CN's intact;  she has a severe peripheral neuropathy w/ decr sensation in LEs, gait abn, decr DTRs. DERM:  dry skin & port-wine birthmark on left  chest...  RADIOLOGY DATA:  Reviewed in the EPIC EMR & discussed w/ the patient...  LABORATORY DATA:  Reviewed in the EPIC EMR & discussed w/ the patient...   Assessment & Plan:    PULM>  She remains stable w/o resp exac, etc...  HBP>  Good control on Aten + Diovan/ HCT, asked to do better job w/ calorie & sodium restriction, get wt down, monitor BP at home etc...  CARDS>  Followed by [name] w/ PAF, Pacer for SSS, etc;  Stable on current regimen w/ Multaq, Coumadin, etc;  Continue same...  CHOL>  Chol OK on the Prav40 but incr TG & we discussed need for better low fat diet, get wt down, etc...  HYPOTHY>  Stable on the Synthroid 128mcg/d, continue same...  Neuropathy>  She hasn't followed up w/ Neuro [name]; discussed slowly inct the Gabapentin dose if nec (currently 300mg  Qhs)...  Anemia/ B12>  Her anemia is improved w/ Hg 12.6, and B12 is now normal on oral supplement...   Patient's Medications  New Prescriptions   No medications on file  Previous Medications   CHOLECALCIFEROL (VITAMIN D PO)    Take 1 tablet by mouth daily.   DRONEDARONE (MULTAQ) 400 MG TABLET    Take 400 mg by mouth 2 (two) times daily with a meal.  ESOMEPRAZOLE (NEXIUM) 40 MG CAPSULE    Take 1 capsule (40 mg total) by mouth daily before breakfast.   GABAPENTIN (NEURONTIN) 300 MG CAPSULE    Take 1 capsule (300 mg total) by mouth 2 (two) times daily.   HYOSCYAMINE (LEVSIN) 0.125 MG/5ML ELIX    Take 125 mcg by mouth every 6 (six) hours as needed. As needed for abdominal cramping    LEVOTHYROXINE (SYNTHROID, LEVOTHROID) 125 MCG TABLET    Take 1 tablet (125 mcg total) by mouth daily.   LOPERAMIDE (IMODIUM) 2 MG CAPSULE    Take 2 mg by mouth 4 (four) times daily as needed.     MIRABEGRON ER (MYRBETRIQ) 50 MG TB24    Take 50 mg by mouth daily.   MULTIPLE VITAMINS-MINERALS (ONE-A-DAY WOMENS 50 PLUS PO)    Take 1 tablet by mouth daily.   PRAVASTATIN (PRAVACHOL) 40 MG TABLET    Take 1 tablet (40 mg total) by mouth  daily.   VALSARTAN-HYDROCHLOROTHIAZIDE (DIOVAN-HCT) 160-12.5 MG PER TABLET    Take 1 tablet by mouth daily.   VITAMIN B-12 (CYANOCOBALAMIN) 500 MCG TABLET    Take 500 mcg by mouth daily.   WARFARIN (COUMADIN) 2 MG TABLET    Take 1/2 a tablet on Monday and Friday. Take 1 tablet the rest of the week.  Modified Medications   Modified Medication Previous Medication   ATENOLOL (TENORMIN) 50 MG TABLET atenolol (TENORMIN) 50 MG tablet      1 1/ daily    Take 1 tablet (50 mg total) by mouth daily.   TRAMADOL (ULTRAM) 50 MG TABLET traMADol (ULTRAM) 50 MG tablet      Take 1 tablet (50 mg total) by mouth 2 (two) times daily as needed for pain.    Take 1 tablet (50 mg total) by mouth every 8 (eight) hours as needed for pain.  Discontinued Medications   AMOXICILLIN (AMOXIL) 500 MG CAPSULE    Take 500 mg by mouth 3 (three) times daily.

## 2012-09-01 DIAGNOSIS — Z85828 Personal history of other malignant neoplasm of skin: Secondary | ICD-10-CM | POA: Diagnosis not present

## 2012-09-01 DIAGNOSIS — L578 Other skin changes due to chronic exposure to nonionizing radiation: Secondary | ICD-10-CM | POA: Diagnosis not present

## 2012-09-04 DIAGNOSIS — I4891 Unspecified atrial fibrillation: Secondary | ICD-10-CM | POA: Diagnosis not present

## 2012-09-04 DIAGNOSIS — Z7901 Long term (current) use of anticoagulants: Secondary | ICD-10-CM | POA: Diagnosis not present

## 2012-09-22 DIAGNOSIS — I4891 Unspecified atrial fibrillation: Secondary | ICD-10-CM | POA: Diagnosis not present

## 2012-09-22 DIAGNOSIS — R6889 Other general symptoms and signs: Secondary | ICD-10-CM | POA: Diagnosis not present

## 2012-09-22 DIAGNOSIS — Z7901 Long term (current) use of anticoagulants: Secondary | ICD-10-CM | POA: Diagnosis not present

## 2012-09-22 DIAGNOSIS — E782 Mixed hyperlipidemia: Secondary | ICD-10-CM | POA: Diagnosis not present

## 2012-09-22 DIAGNOSIS — R0602 Shortness of breath: Secondary | ICD-10-CM | POA: Diagnosis not present

## 2012-09-22 DIAGNOSIS — R5383 Other fatigue: Secondary | ICD-10-CM | POA: Diagnosis not present

## 2012-09-22 DIAGNOSIS — R5381 Other malaise: Secondary | ICD-10-CM | POA: Diagnosis not present

## 2012-09-25 ENCOUNTER — Other Ambulatory Visit (HOSPITAL_COMMUNITY): Payer: Self-pay | Admitting: Cardiovascular Disease

## 2012-09-25 DIAGNOSIS — R0989 Other specified symptoms and signs involving the circulatory and respiratory systems: Secondary | ICD-10-CM

## 2012-09-25 DIAGNOSIS — I4891 Unspecified atrial fibrillation: Secondary | ICD-10-CM

## 2012-10-02 DIAGNOSIS — Z7901 Long term (current) use of anticoagulants: Secondary | ICD-10-CM | POA: Diagnosis not present

## 2012-10-02 DIAGNOSIS — I4891 Unspecified atrial fibrillation: Secondary | ICD-10-CM | POA: Diagnosis not present

## 2012-10-05 ENCOUNTER — Ambulatory Visit (HOSPITAL_COMMUNITY)
Admission: RE | Admit: 2012-10-05 | Discharge: 2012-10-05 | Disposition: A | Discharge status: Home / Self Care | Payer: Medicare Other | Source: Ambulatory Visit | Attending: Cardiovascular Disease | Admitting: Cardiovascular Disease

## 2012-10-05 DIAGNOSIS — R0989 Other specified symptoms and signs involving the circulatory and respiratory systems: Secondary | ICD-10-CM

## 2012-10-05 DIAGNOSIS — I4891 Unspecified atrial fibrillation: Secondary | ICD-10-CM | POA: Diagnosis not present

## 2012-10-05 NOTE — Progress Notes (Signed)
2D Echo Performed 10/05/2012    Marygrace Drought, RCS

## 2012-10-06 NOTE — Progress Notes (Signed)
Carotid duplex completed. Wardell Honour

## 2012-10-08 ENCOUNTER — Ambulatory Visit (HOSPITAL_COMMUNITY): Payer: Medicare Other

## 2012-10-08 ENCOUNTER — Encounter (HOSPITAL_COMMUNITY): Payer: Medicare Other

## 2012-10-30 DIAGNOSIS — I4891 Unspecified atrial fibrillation: Secondary | ICD-10-CM | POA: Diagnosis not present

## 2012-10-30 DIAGNOSIS — Z7901 Long term (current) use of anticoagulants: Secondary | ICD-10-CM | POA: Diagnosis not present

## 2012-11-01 ENCOUNTER — Other Ambulatory Visit: Payer: Self-pay | Admitting: Pulmonary Disease

## 2012-12-07 ENCOUNTER — Telehealth: Payer: Self-pay | Admitting: Pulmonary Disease

## 2012-12-07 NOTE — Telephone Encounter (Signed)
Pt returned call. Jill Washington  

## 2012-12-07 NOTE — Telephone Encounter (Signed)
It should be taken PRN- want to ensure that she understands this before we send the rx [facility]

## 2012-12-08 MED ORDER — TRAMADOL HCL 50 MG PO TABS: 50.0000 mg | ORAL_TABLET | Freq: Two times a day (BID) | ORAL | Status: DC | PRN

## 2012-12-08 NOTE — Telephone Encounter (Signed)
Spoke with pt and clarified that rx for Tramadol was written for one tablet every 12 hrs as needed.  PT understands the instructions but states that she often takes med twice daily due to neuropathy pain.  Pt instructed that refill was sent to [facility].

## 2012-12-11 DIAGNOSIS — I4891 Unspecified atrial fibrillation: Secondary | ICD-10-CM | POA: Diagnosis not present

## 2012-12-14 DIAGNOSIS — L821 Other seborrheic keratosis: Secondary | ICD-10-CM | POA: Diagnosis not present

## 2012-12-24 DIAGNOSIS — Z7901 Long term (current) use of anticoagulants: Secondary | ICD-10-CM | POA: Diagnosis not present

## 2012-12-24 DIAGNOSIS — I4891 Unspecified atrial fibrillation: Secondary | ICD-10-CM | POA: Diagnosis not present

## 2012-12-25 DIAGNOSIS — E782 Mixed hyperlipidemia: Secondary | ICD-10-CM | POA: Diagnosis not present

## 2012-12-31 DIAGNOSIS — R351 Nocturia: Secondary | ICD-10-CM | POA: Diagnosis not present

## 2013-01-21 DIAGNOSIS — I4891 Unspecified atrial fibrillation: Secondary | ICD-10-CM | POA: Diagnosis not present

## 2013-01-21 DIAGNOSIS — Z7901 Long term (current) use of anticoagulants: Secondary | ICD-10-CM | POA: Diagnosis not present

## 2013-01-24 ENCOUNTER — Other Ambulatory Visit: Payer: Self-pay | Admitting: Pulmonary Disease

## 2013-01-28 ENCOUNTER — Ambulatory Visit: Payer: Medicare Other | Admitting: Pulmonary Disease

## 2013-01-29 ENCOUNTER — Ambulatory Visit: Payer: Medicare Other | Admitting: Pulmonary Disease

## 2013-01-30 ENCOUNTER — Encounter: Payer: Self-pay | Admitting: Pharmacist Clinician (PhC)/ Clinical Pharmacy Specialist

## 2013-01-30 DIAGNOSIS — Z7901 Long term (current) use of anticoagulants: Secondary | ICD-10-CM

## 2013-01-30 DIAGNOSIS — I4891 Unspecified atrial fibrillation: Secondary | ICD-10-CM

## 2013-02-09 ENCOUNTER — Ambulatory Visit (INDEPENDENT_AMBULATORY_CARE_PROVIDER_SITE_OTHER): Payer: Medicare Other | Admitting: Pulmonary Disease

## 2013-02-09 ENCOUNTER — Encounter: Payer: Self-pay | Admitting: Pulmonary Disease

## 2013-02-09 VITALS — BP 128/76 | HR 73 | Temp 97.4°F | Ht 67.0 in | Wt 194.2 lb

## 2013-02-09 DIAGNOSIS — I059 Rheumatic mitral valve disease, unspecified: Secondary | ICD-10-CM | POA: Diagnosis not present

## 2013-02-09 DIAGNOSIS — I10 Essential (primary) hypertension: Secondary | ICD-10-CM | POA: Diagnosis not present

## 2013-02-09 DIAGNOSIS — IMO0001 Reserved for inherently not codable concepts without codable children: Secondary | ICD-10-CM

## 2013-02-09 DIAGNOSIS — E039 Hypothyroidism, unspecified: Secondary | ICD-10-CM

## 2013-02-09 DIAGNOSIS — K449 Diaphragmatic hernia without obstruction or gangrene: Secondary | ICD-10-CM

## 2013-02-09 DIAGNOSIS — I4891 Unspecified atrial fibrillation: Secondary | ICD-10-CM

## 2013-02-09 DIAGNOSIS — I872 Venous insufficiency (chronic) (peripheral): Secondary | ICD-10-CM

## 2013-02-09 DIAGNOSIS — Z950 Presence of cardiac pacemaker: Secondary | ICD-10-CM | POA: Diagnosis not present

## 2013-02-09 DIAGNOSIS — D649 Anemia, unspecified: Secondary | ICD-10-CM

## 2013-02-09 DIAGNOSIS — K589 Irritable bowel syndrome without diarrhea: Secondary | ICD-10-CM

## 2013-02-09 DIAGNOSIS — E559 Vitamin D deficiency, unspecified: Secondary | ICD-10-CM

## 2013-02-09 DIAGNOSIS — D126 Benign neoplasm of colon, unspecified: Secondary | ICD-10-CM

## 2013-02-09 DIAGNOSIS — E7800 Pure hypercholesterolemia, unspecified: Secondary | ICD-10-CM

## 2013-02-09 DIAGNOSIS — G609 Hereditary and idiopathic neuropathy, unspecified: Secondary | ICD-10-CM

## 2013-02-09 NOTE — Patient Instructions (Addendum)
Today we updated your med list in our EPIC system...    Continue your current medications the same...  Please return to our lab one morning this week for your FASTING blood work...    We will contact you w/ the results when available...   Call for any questions...  Let's plan a follow up visit in 6mo, sooner if needed for problems...   

## 2013-02-09 NOTE — Progress Notes (Signed)
Subjective:    Patient ID: Jill Washington, female    DOB: [birth year], 79 y.o.   MRN: [id number]  HPI 79 y/o WF here for a follow up visit... she has multiple medical problems as noted below... She is the sister of Jill Washington. Followed for general medical purposes w/ hx of Empyema & critical illness in 1982 from which she made a nice recovery, HBP, MVP, AFib & SSS w/ pacer, Hyperchol, Hypothy, HH/ IBS/ Colon Polyps, Neuropathy/ FM... she is also followed by [name] for Cards, [name] for GI, & [name] for Neuro...  ~  July 31, 2011:  40mo ROV & she has been stable overall w/ a few IBS related issues & neuropathy related discomfort;  She saw [name] 8/12 & pacer check was OK, doing well overall & no changes made to her regimen;  We reviewed her prev labs and as she is not fasting today we decided to just recheck her blood work on return...  ~  January 30, 2012:  88mo ROV & Jill Washington is c/o IBS symptoms- abd cramping & tendency toward diarrhea> rec to add Fiber, Metamucil, Levsin & Immodium prn; f/u w/ [name] esp in light of her c/o incont, leakage...  BP looks good on Aten50 & DiovanHCT;  On Multaq per [name] & COUMADIN (she is due for f/u [name])...  Chol is ok on Prav40 but TG still sl elev & we reviewed low fat diet + exercise...  We reviewed prob list, meds, xrays and labs> see below for updates >> LABS 4/13:  FLP- chol at goal on Prav40 but TG=182;  Chems- on w/ Creat=1.5 stable;  CBC- wnl;  TSH=1.41  ~  July 31, 2012:  50mo ROV & Jill Washington has a lot of aches & pains- uses the Tramadol + Tylenol w/ some relief...     BP controlled on Aten & DiovanHct, measures 110/72 today & denies CP, palpit, dizzy, SOB, edema;  Known PAF, SSS- pacer on Coumadin & Multaq followed by [facility];  Chol is treated w/ Prav40 & her chol numbers are good, needs better low fat diet & wt reduction;  She is followed by [name] for GI & [name] for Urology- on [facility] for bladder...    We reviewed prob list, meds, xrays  and labs> see below for updates >> ok 2013 Flu vaccine today...  ~  February 09, 2013:  66mo ROV & Jill Washington reports stable, various aches & pains at 86, some difficulty getting about, and we discussed exercise, using cane, etc;  We reviewed the following medical problems during today's office visit >>     HBP> on Aten50-1.5tabs/d, DiovanHCT80-12.5 (decr from 160 by [name] 3/14); BP= 128/76 & she denies HA, visual symptoms, or change in CP/ palpit/ SOB/ edema, she is back walking 2d/wk...    MVP, AFib, SSS w/ Pacer> followed by [name] on Amio200 & Coumadin> last seen 3/14 & doing satis on Amio (off prev Multaq), Hx neg Myoview 11/11 w/o ischemia, pacer generator changed 5/13, he decr her DiovanHCT to 80-12.5 due to low BP...    Ven Insuffic> she knows to elim sodium, elev legs, wear support hose, etc...    CHOL> on Prav40; FLP 4/14 shows TChol 145, TG 190, HDL 35, LDL 72; Rec to continue same med + get on better low fat diet...    Hypothyroid> on Synthroid125; lab 4/14 shows TSH= 1.99; Rec to continue same...    GI- HH, IBS, Polyps> on Nexium40 & uses OTC Immodium prn; last EGD 2006 showed HH,  otherw neg; last colon 7/13 showed severe diverticulosis & hems...    GU- freq, urge incont> on Myrbetriq50 per [name] & symptoms improved...    DJD, FM> on Tramadol50 prn; she manages fairly well w/ some stiffness & pain...    Vit D Defic> on VitD supplement; lab 4/14 shows VitD level = 39; Rec to continue daily OTC Vit D supplement...    Periph Neuropathy> on Neurontin 300Bid; prev eval by [name] w/ severe periph neuropathy- on Neurontin which helps her symptoms...    Hx mild anemia & borderline B12 level> on MVI + B12 supplement; lab shows Hg= 13.4, MCV= 93 & prev B12 level repleted to 775... We reviewed prob list, meds, xrays and labs> see below for updates >> she had the 2013 flu vaccine 10/13... LABS 4/14:  FLP- ok on Prav40 x TG=190;  Chems- ok w/ Cr=1.4;  CBC- wnl;  TSH=1.99 on [id number];  VitD=39 on OTC  supplement...          Problem List:  Hx of EMPYEMA (ICD-510.9) - hx of critical illness in 1982 w/ epiglottitis, sepsis, empyema, resp failure w/ ARDS, critical illness myopathy & polyneuropathy- from which she made a remarkable recovery... ~  CXR 1/11 showed mild cardiomeg, right pacemaker, mild basilar scarring, NAD. ~  CXR 5/13 showed cardiomeg, pacer, flattened diaph, stable subglottic tracheal narrowing, some scarring, otherw neg/ NAD...  HYPERTENSION (ICD-401.9) >>  ~  on ATENOLOL 50mg /d &  DIOVAN/Hct 160-12.5 daily... She is supposed to monitor BP at home. ~  10/12:  BP today = 130/64, taking meds regularly, tolerating meds well... she denies HA, fatigue, visual changes, CP, palipit, dizziness, syncope, dyspnea, edema, etc... ~  4/13:  BP= 132/84 & she remains largely asymptomatic... ~  10/13:  BP= 110/72 & she remains stable... ~  4/14: on Aten50-1.5tabs/d, DiovanHCT80-12.5 (decr from 160 by [name] 3/14); BP= 128/76 & she denies HA, visual symptoms, or change in CP/ palpit/ SOB/ edema, she is back walking 2d/wk.  MITRAL VALVE PROLAPSE (ICD-424.0) - no complaints of CP or palpit... cath in 1998 showed norm coronaries + MVP...  ~  NuclearStressTest 8/05 showed inferior wall artifact, no ischemia, EF= 69%... ~  2DEcho 4/06 by [facility] showed mild intermittent prolapse of the ant leaflet of the MV, norm LV size w/ mod ASH... ~  f/u Myoview 11/09 showed norm perfusion w/o infarct, w/o ischemia. ~  repeat 2DEcho 6/10 showed norm LVF, norm valves, DD. ~  repeat 2DEcho 10/11 showed mild conc LVH, norm LVF w/ EF>55% ~  repeat Myoview 11/11 showed no wall motion abn, no ischemia, no changes. ~  EKG 5/13 shows dual AV paced rhythm... ~  CDopplers 12/13 showed wnl- 0-39% bilat ICAstenoses... ~  2DEcho 12/13 by [name] showed mild-mod concLVH, EF=45-50%, Gr1DD, mildMR, mild dyssynergy from the pacer  Hx of ATRIAL FIBRILLATION (ICD-427.31) & SICK SINUS SYNDROME (ICD-427.81) - on COUMADIN & Multaq  400mg Bid, followed by [facility]... pacemaker placed by [name] in 1998 (SSS) and followed by him... Generator changed 10/06 and Pacer checked periodically- doing well...  ~  11/10:  [name] changed Amio to [place] 400mg Bid... ~  EKG 5/13 shows dual AV paced rhythm... ~  Subsequently the Multaq was stopped, & placed back on Amio200 per [name]...  VENOUS INSUFFICIENCY (ICD-459.81) - she follows a low sodium diet, elevates legs, support hose when able... ~  11/11:  Adm w/ right leg cellultis, VenDopplers neg for DVT, resolved w/ Unasyn ==> Augmentin.  HYPERCHOLESTEROLEMIA (ICD-272.0) - on PRAVASTATIN 40mg Qhs + diet &  exercise... ~  [place] 9/09 on diet alone showed TChol 268, TG 220, HDL 42, LDL 149... Prav40 started. ~  [place] 1/11 on Prav40 showed TChol 152, TG 109, HDL 48, LDL 82... keep same. ~  [place] 4/12 on Prav40 showed TChol 142, TG 184, HDL 36, LDL 69 ~  FLP 4/13 on Prav40 showed TChol 116, TG 182, HDL 35, LDL 45... Needs better low fat diet. ~  FLP 4/14 on Prav40 showed TChol 145, TG 190, HDL 35, LDL 72  HYPOTHYROIDISM (ICD-244.9) - on LEVOTHYROID 137mcg/d... she is due for f/u labs... ~  last labs 10/08 by [name] showed TSH= 6.31... dose incr to 125 at that time... ~  labs 9/09 on Levo125 showed TSH= 5.09 ~  labs 1/11 on Levo125 showed TSH= 4.59 ~  Labs 4/12 on Levo125 showed TSH= 1.69 ~  Labs 4/13 on Levo125 showed TSH= 1.41 ~  Labs 4/14 on Levo125 showed TSH= 1.99  HIATAL HERNIA (ICD-553.3) - on NEXIUM 40mg  Prn & she is asked to take this daily... last EGD 4/06 showed HH, otherw neg...  POLYP, COLON (ICD-211.3) - last colonoscopy 4/08 by [name] showed divertics, 50mm polyp removed= hyperplastic...  and some spasm- "IBS response" to scope... She states that [place] didn't help her cramping but Immodiium does help the diarrhea... ~  Colonoscopy 7/13 showed severe diverticulosis & hems...  FIBROMYALGIA (ICD-729.1) - she uses TRAMADOL 50mg  Prn & METHOCARBAMOL Prn for musc spasm.  VITAMIN D  DEFICIENCY (ICD-268.9) - prev on Vit D 50K weekly & switched to 1000 u OTC daily... ~  labs 9/09 showed Vit D level = 16... start Vit D 50. ~  labs 1/11 on VitD 50K showed Vit D level = 34... OK switch to 1000 u daily. ~  Labs 4/14 showed VitD level = 35.... Continue supplement.  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 ~  11/10:  [name] tried switch Neurontin to Cymbalta for neuropathy symptoms- she reacted to the Cymbalta w/ trembling, weakness, confusion & changed back to GABAPENTIN 300mg  Qd... we discussed Neuro appt > ~  2/11:  Neuro eval [name] w/ EMG/ NCV showing severe length dependent axonal sensorimotor polyneuropathy. ~  4/14:  on Neurontin 300Bid; prev eval by [name] w/ severe periph neuropathy- on Neurontin which helps her symptoms.  BORDERLINE B12 LEVELS - rec to take Vit B12 orally ~568mcg/d... ~  labs 1/11 showed B12= 267 (211-911) ~  labs 11/11 in hosp showed B12= 237 (she wasn't taking any B12 supplement) & rec to start 546mcg/d. ~  Labs 4/12 showed B12 level = 775, continue oral supplementation...  MILD ANEMIA - Labs have been WNL thru office, but mild anemia found 11/11 hosp for cellulitis... on Coumadin per [facility]. ~  labs 11/11 in hosp showed Hg= 11.0 ==> 10.5, Fe=54 (22%sat), Ferritin=142 ~  Labs 4/12 showed Hg= 12.9 on MVI & B12 supplementation... ~  Labs 4/13 showed Hg= 12.6 ~  Labs 4/14 showed Hg= 13.4  SKIN CANCER, HX OF (ICD-V10.83) - followed by Derm... 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  Health Maintenance - GYN= DrRKaplan but she understood him to say she didn't need f/u... had Mammogram & BMD in ?2006 but she's not sure...  takes Calcium, MVI, Vit D...   Past Surgical History  Procedure Laterality Date  . Cervical spine surgery    . Cholecystectomy  03/2005    by Dr.  Dalbert Batman  . Total abdominal hysterectomy w/  bilateral salpingoophorectomy    . Colonoscopy  02/05/2007,02/05/05    Dr. Silvano Rusk  . Esophagogastroduodenoscopy  02/05/2005,01/22/90    Dr. Silvano Rusk, Dr. Rachelle Hora  . Pacemaker implanted  05/11/97,08/02/05  . Appendectomy  1971  . Tonsillectomy      Outpatient Encounter Prescriptions as of 02/09/2013  Medication Sig Dispense Refill  . atenolol (TENORMIN) 50 MG tablet 1 1/2  daily      . Cholecalciferol (VITAMIN D PO) Take 1 tablet by mouth daily.      Marland Kitchen gabapentin (NEURONTIN) 300 MG capsule TAKE 1 CAPSULE BY MOUTH TWICE DAILY  180 capsule  0  . levothyroxine (SYNTHROID, LEVOTHROID) 125 MCG tablet Take 1 tablet (125 mcg total) by mouth daily.  90 tablet  3  . loperamide (IMODIUM) 2 MG capsule Take 2 mg by mouth 4 (four) times daily as needed.        . mirabegron ER (MYRBETRIQ) 50 MG TB24 Take 50 mg by mouth daily.      . Multiple Vitamins-Minerals (ONE-A-DAY WOMENS 50 PLUS PO) Take 1 tablet by mouth daily.      Marland Kitchen NEXIUM 40 MG capsule TAKE 1 CAPSULE BY MOUTH DAILY BEFORE BREAKFAST  90 capsule  1  . pravastatin (PRAVACHOL) 40 MG tablet TAKE 1 TABLET BY MOUTH DAILY  90 tablet  1  . traMADol (ULTRAM) 50 MG tablet Take 1 tablet (50 mg total) by mouth 2 (two) times daily as needed for pain.  180 tablet  1  . valsartan-hydrochlorothiazide (DIOVAN-HCT) 160-12.5 MG per tablet Take 1 tablet by mouth daily.  10 tablet  0  . vitamin B-12 (CYANOCOBALAMIN) 500 MCG tablet Take 500 mcg by mouth daily.      Marland Kitchen warfarin (COUMADIN) 2 MG tablet Take 1  a tablet on Monday and Friday. Take 1/2  tablet the rest of the week.      . [DISCONTINUED] dronedarone (MULTAQ) 400 MG tablet Take 400 mg by mouth 2 (two) times daily with a meal.        . [DISCONTINUED] hyoscyamine (LEVSIN) 0.125 MG/5ML ELIX Take 125 mcg by mouth every 6 (six) hours as needed. As needed for abdominal cramping        No facility-administered encounter medications on file as of 02/09/2013.    Allergies  Allergen Reactions  .  Iodinated Diagnostic Agents   . Other Itching    Dial Soap    Current Medications, Allergies, Past Medical History, Past Surgical History, Family History, and Social History were reviewed in [employer] record.    Review of Systems        See HPI - all other systems neg except as noted...      The patient complains of decreased hearing, dyspnea on exertion, peripheral edema, muscle weakness, and difficulty walking.  The patient denies anorexia, fever, weight loss, weight gain, vision loss, hoarseness, chest pain, syncope, prolonged cough, headaches, hemoptysis, abdominal pain, melena, hematochezia, severe indigestion/heartburn, hematuria, incontinence, suspicious skin lesions, transient blindness, depression, unusual weight change, abnormal bleeding, enlarged lymph nodes, and angioedema.     Objective:   Physical Exam      WD, WN, 79 y/o WF in NAD... GENERAL:  Alert & oriented; pleasant & cooperative... HEENT:  [place]/AT, EOM-wnl, PERRLA, EACs-clear, TMs-wnl, NOSE-clear, THROAT-clear & wnl. NECK:  Supple w/ fairROM; no JVD; normal carotid impulses w/o bruits; no thyromegaly or nodules palpated; no lymphadenopathy. CHEST:  Clear to P & A; without  wheezes/ rales/ or rhonchi heard... HEART:  Regular Rhythm; pacer, gr 1-2/6 SEM S4 no rubs or S3... ABDOMEN:  Soft & nontender; normal bowel sounds; no organomegaly or masses detected. EXT: without deformities, mod arthritic changes; no varicose veins/ +venous insuffic/ 1+edema. NEURO:  CN's intact;  she has a severe peripheral neuropathy w/ decr sensation in LEs, gait abn, decr DTRs. DERM:  dry skin & port-wine birthmark on left chest...  RADIOLOGY DATA:  Reviewed in the EPIC EMR & discussed w/ the patient...  LABORATORY DATA:  Reviewed in the EPIC EMR & discussed w/ the patient...   Assessment & Plan:    PULM>  She remains stable w/o resp exac, etc...  HBP>  Good control on Aten + Diovan/ HCT, asked to do better job  w/ calorie & sodium restriction, get wt down, monitor BP at home etc...  CARDS>  Followed by [name] w/ PAF, Pacer for SSS, etc;  Stable on current regimen w/ Multaq, Coumadin, etc;  Continue same...  CHOL>  Chol OK on the Prav40 but incr TG & we discussed need for better low fat diet, get wt down, etc...  HYPOTHY>  Stable on the Synthroid 135mcg/d, continue same...  Neuropathy>  She hasn't followed up w/ Neuro [name]; discussed slowly inct the Gabapentin dose if nec (currently 300mg  Qhs)...  Anemia/ B12>  Her anemia is improved w/ Hg 13.4, and B12 is now normal on oral supplement...   Patient's Medications  New Prescriptions   No medications on file  Previous Medications   AMIODARONE (PACERONE) 200 MG TABLET    Take 200 mg by mouth daily.   ATENOLOL (TENORMIN) 50 MG TABLET    1 1/2  daily   CHOLECALCIFEROL (VITAMIN D PO)    Take 1 tablet by mouth daily.   GABAPENTIN (NEURONTIN) 300 MG CAPSULE    TAKE 1 CAPSULE BY MOUTH TWICE DAILY   LEVOTHYROXINE (SYNTHROID, LEVOTHROID) 125 MCG TABLET    Take 1 tablet (125 mcg total) by mouth daily.   LOPERAMIDE (IMODIUM) 2 MG CAPSULE    Take 2 mg by mouth 4 (four) times daily as needed.     MIRABEGRON ER (MYRBETRIQ) 50 MG TB24    Take 50 mg by mouth daily.   MULTIPLE VITAMINS-MINERALS (ONE-A-DAY WOMENS 50 PLUS PO)    Take 1 tablet by mouth daily.   NEXIUM 40 MG CAPSULE    TAKE 1 CAPSULE BY MOUTH DAILY BEFORE BREAKFAST   PRAVASTATIN (PRAVACHOL) 40 MG TABLET    TAKE 1 TABLET BY MOUTH DAILY   TRAMADOL (ULTRAM) 50 MG TABLET    Take 1 tablet (50 mg total) by mouth 2 (two) times daily as needed for pain.   VALSARTAN-HYDROCHLOROTHIAZIDE (DIOVAN-HCT) 160-12.5 MG PER TABLET    Take 1 tablet by mouth daily.   VITAMIN B-12 (CYANOCOBALAMIN) 500 MCG TABLET    Take 500 mcg by mouth daily.   WARFARIN (COUMADIN) 2 MG TABLET    Take 1  a tablet on Monday and Friday. Take 1/2  tablet the rest of the week.  Modified Medications   No medications on file  Discontinued  Medications   DRONEDARONE (MULTAQ) 400 MG TABLET    Take 400 mg by mouth 2 (two) times daily with a meal.     HYOSCYAMINE (LEVSIN) 0.125 MG/5ML ELIX    Take 125 mcg by mouth every 6 (six) hours as needed. As needed for abdominal cramping

## 2013-02-11 ENCOUNTER — Other Ambulatory Visit (INDEPENDENT_AMBULATORY_CARE_PROVIDER_SITE_OTHER): Payer: Medicare Other

## 2013-02-11 DIAGNOSIS — E039 Hypothyroidism, unspecified: Secondary | ICD-10-CM

## 2013-02-11 DIAGNOSIS — E7800 Pure hypercholesterolemia, unspecified: Secondary | ICD-10-CM

## 2013-02-11 DIAGNOSIS — E559 Vitamin D deficiency, unspecified: Secondary | ICD-10-CM | POA: Diagnosis not present

## 2013-02-11 DIAGNOSIS — I4891 Unspecified atrial fibrillation: Secondary | ICD-10-CM

## 2013-02-11 DIAGNOSIS — I10 Essential (primary) hypertension: Secondary | ICD-10-CM | POA: Diagnosis not present

## 2013-02-11 LAB — CBC WITH DIFFERENTIAL/PLATELET
Basophils Relative: 0.9 % (ref 0.0–3.0)
Eosinophils Absolute: 0.2 10*3/uL (ref 0.0–0.7)
HCT: 39 % (ref 36.0–46.0)
Lymphs Abs: 1.1 10*3/uL (ref 0.7–4.0)
MCHC: 34.3 g/dL (ref 30.0–36.0)
MCV: 92.5 fl (ref 78.0–100.0)
Monocytes Absolute: 0.4 10*3/uL (ref 0.1–1.0)
Neutro Abs: 3.2 10*3/uL (ref 1.4–7.7)
Neutrophils Relative %: 64.1 % (ref 43.0–77.0)
RBC: 4.22 Mil/uL (ref 3.87–5.11)

## 2013-02-11 LAB — BASIC METABOLIC PANEL
Chloride: 108 mEq/L (ref 96–112)
Creatinine, Ser: 1.4 mg/dL — ABNORMAL HIGH (ref 0.4–1.2)
GFR: 38.98 mL/min — ABNORMAL LOW (ref >60.00)
Potassium: 4.6 mEq/L (ref 3.5–5.1)

## 2013-02-11 LAB — HEPATIC FUNCTION PANEL
ALT: 16 U/L (ref 0–35)
AST: 22 U/L (ref 0–37)
Bilirubin, Direct: 0.1 mg/dL (ref 0.0–0.3)
Total Bilirubin: 0.7 mg/dL (ref 0.3–1.2)

## 2013-02-11 LAB — LIPID PANEL
LDL Cholesterol: 72 mg/dL (ref 0–99)
Total CHOL/HDL Ratio: 4

## 2013-02-12 LAB — VITAMIN D 25 HYDROXY (VIT D DEFICIENCY, FRACTURES): Vit D, 25-Hydroxy: 35 ng/mL (ref 30–89)

## 2013-02-18 DIAGNOSIS — Z7901 Long term (current) use of anticoagulants: Secondary | ICD-10-CM | POA: Diagnosis not present

## 2013-02-18 DIAGNOSIS — I4891 Unspecified atrial fibrillation: Secondary | ICD-10-CM | POA: Diagnosis not present

## 2013-03-18 ENCOUNTER — Ambulatory Visit (INDEPENDENT_AMBULATORY_CARE_PROVIDER_SITE_OTHER): Payer: Medicare Other | Admitting: Pharmacist Clinician (PhC)/ Clinical Pharmacy Specialist

## 2013-03-18 VITALS — BP 146/80 | HR 80

## 2013-03-18 DIAGNOSIS — I4891 Unspecified atrial fibrillation: Secondary | ICD-10-CM | POA: Diagnosis not present

## 2013-03-18 DIAGNOSIS — Z7901 Long term (current) use of anticoagulants: Secondary | ICD-10-CM | POA: Diagnosis not present

## 2013-03-22 ENCOUNTER — Other Ambulatory Visit: Payer: Self-pay | Admitting: Pulmonary Disease

## 2013-03-30 ENCOUNTER — Telehealth: Payer: Self-pay | Admitting: Cardiovascular Disease

## 2013-03-30 MED ORDER — AMIODARONE HCL 200 MG PO TABS: 200.0000 mg | ORAL_TABLET | Freq: Every day | ORAL | Status: DC

## 2013-03-30 NOTE — Telephone Encounter (Signed)
Need new prescription for [facility] for Pacerone 200mg -[facility] number [phone]!

## 2013-03-30 NOTE — Telephone Encounter (Signed)
Returned call.  Pt stated [facility] told her to call b/c she didn't have anymore refills on pacerone.  Pt informed refill will be sent and of the refill process.  Pt verbalized understanding and agreed w/ plan.  Refill(s) sent to pharmacy.

## 2013-04-09 DIAGNOSIS — D3130 Benign neoplasm of unspecified choroid: Secondary | ICD-10-CM | POA: Diagnosis not present

## 2013-04-09 DIAGNOSIS — H2510 Age-related nuclear cataract, unspecified eye: Secondary | ICD-10-CM | POA: Diagnosis not present

## 2013-04-19 ENCOUNTER — Ambulatory Visit (INDEPENDENT_AMBULATORY_CARE_PROVIDER_SITE_OTHER): Payer: Medicare Other | Admitting: Pharmacist Clinician (PhC)/ Clinical Pharmacy Specialist

## 2013-04-19 VITALS — BP 120/64 | HR 76

## 2013-04-19 DIAGNOSIS — Z7901 Long term (current) use of anticoagulants: Secondary | ICD-10-CM

## 2013-04-19 DIAGNOSIS — I4891 Unspecified atrial fibrillation: Secondary | ICD-10-CM

## 2013-05-04 ENCOUNTER — Other Ambulatory Visit: Payer: Self-pay | Admitting: Pulmonary Disease

## 2013-05-17 ENCOUNTER — Ambulatory Visit (INDEPENDENT_AMBULATORY_CARE_PROVIDER_SITE_OTHER): Payer: Medicare Other | Admitting: Pharmacist Clinician (PhC)/ Clinical Pharmacy Specialist

## 2013-05-17 DIAGNOSIS — Z7901 Long term (current) use of anticoagulants: Secondary | ICD-10-CM | POA: Diagnosis not present

## 2013-05-17 DIAGNOSIS — I4891 Unspecified atrial fibrillation: Secondary | ICD-10-CM | POA: Diagnosis not present

## 2013-05-17 LAB — POCT INR: INR: 4.1

## 2013-06-01 ENCOUNTER — Ambulatory Visit (INDEPENDENT_AMBULATORY_CARE_PROVIDER_SITE_OTHER): Payer: Medicare Other | Admitting: Pharmacist Clinician (PhC)/ Clinical Pharmacy Specialist

## 2013-06-01 DIAGNOSIS — I4891 Unspecified atrial fibrillation: Secondary | ICD-10-CM | POA: Diagnosis not present

## 2013-06-01 DIAGNOSIS — Z7901 Long term (current) use of anticoagulants: Secondary | ICD-10-CM

## 2013-06-01 LAB — POCT INR: INR: 3.4

## 2013-06-22 ENCOUNTER — Ambulatory Visit (INDEPENDENT_AMBULATORY_CARE_PROVIDER_SITE_OTHER): Payer: Medicare Other | Admitting: Pharmacist Clinician (PhC)/ Clinical Pharmacy Specialist

## 2013-06-22 VITALS — BP 120/72 | HR 84

## 2013-06-22 DIAGNOSIS — Z7901 Long term (current) use of anticoagulants: Secondary | ICD-10-CM

## 2013-06-22 DIAGNOSIS — I4891 Unspecified atrial fibrillation: Secondary | ICD-10-CM

## 2013-07-02 ENCOUNTER — Other Ambulatory Visit: Payer: Self-pay

## 2013-07-02 ENCOUNTER — Other Ambulatory Visit: Payer: Self-pay | Admitting: Cardiovascular Disease

## 2013-07-02 DIAGNOSIS — R5383 Other fatigue: Secondary | ICD-10-CM | POA: Diagnosis not present

## 2013-07-02 DIAGNOSIS — R5381 Other malaise: Secondary | ICD-10-CM | POA: Diagnosis not present

## 2013-07-02 DIAGNOSIS — R6889 Other general symptoms and signs: Secondary | ICD-10-CM | POA: Diagnosis not present

## 2013-07-02 DIAGNOSIS — R0602 Shortness of breath: Secondary | ICD-10-CM | POA: Diagnosis not present

## 2013-07-03 LAB — COMPLETE METABOLIC PANEL WITH GFR
Albumin: 3.9 g/dL (ref 3.5–5.2)
Alkaline Phosphatase: 57 U/L (ref 39–117)
BUN: 14 mg/dL (ref 6–23)
Creat: 1.54 mg/dL — ABNORMAL HIGH (ref 0.50–1.10)
GFR, Est Non African American: 31 mL/min — ABNORMAL LOW
Glucose, Bld: 81 mg/dL (ref 70–99)
Total Bilirubin: 0.3 mg/dL (ref 0.3–1.2)

## 2013-07-03 LAB — T4, FREE: Free T4: 1.63 ng/dL (ref 0.80–1.80)

## 2013-07-03 LAB — TSH: TSH: 3.977 u[IU]/mL (ref 0.350–4.500)

## 2013-07-05 ENCOUNTER — Other Ambulatory Visit (HOSPITAL_COMMUNITY): Payer: Self-pay | Admitting: Cardiovascular Disease

## 2013-07-05 DIAGNOSIS — R0602 Shortness of breath: Secondary | ICD-10-CM

## 2013-07-05 DIAGNOSIS — I447 Left bundle-branch block, unspecified: Secondary | ICD-10-CM

## 2013-07-05 DIAGNOSIS — I509 Heart failure, unspecified: Secondary | ICD-10-CM

## 2013-07-05 LAB — PACEMAKER DEVICE OBSERVATION
AL AMPLITUDE: 3 mv
AL THRESHOLD: 0.5 V
BAMS-0003: 70 {beats}/min
BATTERY VOLTAGE: 2.95 V

## 2013-07-08 ENCOUNTER — Encounter: Payer: Self-pay | Admitting: Cardiovascular Disease

## 2013-07-09 ENCOUNTER — Encounter: Payer: Self-pay | Admitting: Cardiovascular Disease

## 2013-07-11 DIAGNOSIS — Z23 Encounter for immunization: Secondary | ICD-10-CM | POA: Diagnosis not present

## 2013-07-20 ENCOUNTER — Telehealth: Payer: Self-pay | Admitting: Pulmonary Disease

## 2013-07-20 ENCOUNTER — Ambulatory Visit (INDEPENDENT_AMBULATORY_CARE_PROVIDER_SITE_OTHER): Payer: Medicare Other | Admitting: Pharmacist Clinician (PhC)/ Clinical Pharmacy Specialist

## 2013-07-20 VITALS — BP 136/72 | HR 88

## 2013-07-20 DIAGNOSIS — I4891 Unspecified atrial fibrillation: Secondary | ICD-10-CM | POA: Diagnosis not present

## 2013-07-20 DIAGNOSIS — Z7901 Long term (current) use of anticoagulants: Secondary | ICD-10-CM

## 2013-07-20 MED ORDER — TRAMADOL HCL 50 MG PO TABS: 50.0000 mg | ORAL_TABLET | Freq: Two times a day (BID) | ORAL | Status: DC | PRN

## 2013-07-20 NOTE — Telephone Encounter (Signed)
I spoke with pt and made her aware we will [facility] her rx for tramadol. She voiced her understanding and needed nothing further. Fax #: [phone]. Pt does not need a call back once done.

## 2013-07-20 NOTE — Telephone Encounter (Signed)
rx has been signed and faxed to [facility].

## 2013-07-31 DIAGNOSIS — L0889 Other specified local infections of the skin and subcutaneous tissue: Secondary | ICD-10-CM | POA: Diagnosis not present

## 2013-07-31 DIAGNOSIS — S8010XA Contusion of unspecified lower leg, initial encounter: Secondary | ICD-10-CM | POA: Diagnosis not present

## 2013-08-04 ENCOUNTER — Other Ambulatory Visit: Payer: Self-pay | Admitting: Pulmonary Disease

## 2013-08-04 ENCOUNTER — Ambulatory Visit (HOSPITAL_COMMUNITY)
Admission: RE | Admit: 2013-08-04 | Discharge: 2013-08-04 | Disposition: A | Discharge status: Home / Self Care | Payer: Medicare Other | Source: Ambulatory Visit | Attending: Cardiovascular Disease | Admitting: Cardiovascular Disease

## 2013-08-04 DIAGNOSIS — R0989 Other specified symptoms and signs involving the circulatory and respiratory systems: Secondary | ICD-10-CM | POA: Insufficient documentation

## 2013-08-04 DIAGNOSIS — L97209 Non-pressure chronic ulcer of unspecified calf with unspecified severity: Secondary | ICD-10-CM | POA: Diagnosis not present

## 2013-08-04 DIAGNOSIS — I059 Rheumatic mitral valve disease, unspecified: Secondary | ICD-10-CM | POA: Diagnosis not present

## 2013-08-04 DIAGNOSIS — R0609 Other forms of dyspnea: Secondary | ICD-10-CM | POA: Diagnosis not present

## 2013-08-04 DIAGNOSIS — I079 Rheumatic tricuspid valve disease, unspecified: Secondary | ICD-10-CM | POA: Insufficient documentation

## 2013-08-04 DIAGNOSIS — I379 Nonrheumatic pulmonary valve disorder, unspecified: Secondary | ICD-10-CM | POA: Diagnosis not present

## 2013-08-04 DIAGNOSIS — Z950 Presence of cardiac pacemaker: Secondary | ICD-10-CM | POA: Diagnosis not present

## 2013-08-04 DIAGNOSIS — I509 Heart failure, unspecified: Secondary | ICD-10-CM | POA: Diagnosis not present

## 2013-08-04 DIAGNOSIS — I447 Left bundle-branch block, unspecified: Secondary | ICD-10-CM

## 2013-08-04 DIAGNOSIS — E039 Hypothyroidism, unspecified: Secondary | ICD-10-CM | POA: Diagnosis not present

## 2013-08-04 DIAGNOSIS — I10 Essential (primary) hypertension: Secondary | ICD-10-CM | POA: Diagnosis not present

## 2013-08-04 DIAGNOSIS — Z7901 Long term (current) use of anticoagulants: Secondary | ICD-10-CM | POA: Diagnosis not present

## 2013-08-04 DIAGNOSIS — I4891 Unspecified atrial fibrillation: Secondary | ICD-10-CM | POA: Insufficient documentation

## 2013-08-04 DIAGNOSIS — I499 Cardiac arrhythmia, unspecified: Secondary | ICD-10-CM | POA: Diagnosis not present

## 2013-08-04 DIAGNOSIS — R0602 Shortness of breath: Secondary | ICD-10-CM | POA: Diagnosis not present

## 2013-08-04 DIAGNOSIS — T1490XA Injury, unspecified, initial encounter: Secondary | ICD-10-CM | POA: Diagnosis not present

## 2013-08-04 NOTE — Progress Notes (Signed)
2D Echo Performed 08/04/2013    Marygrace Drought, RCS

## 2013-08-06 ENCOUNTER — Other Ambulatory Visit: Payer: Self-pay | Admitting: Pulmonary Disease

## 2013-08-11 ENCOUNTER — Ambulatory Visit (INDEPENDENT_AMBULATORY_CARE_PROVIDER_SITE_OTHER): Payer: Medicare Other | Admitting: Pulmonary Disease

## 2013-08-11 ENCOUNTER — Encounter: Payer: Self-pay | Admitting: Pulmonary Disease

## 2013-08-11 VITALS — BP 126/82 | HR 69 | Temp 98.4°F | Ht 67.0 in | Wt 194.6 lb

## 2013-08-11 DIAGNOSIS — I059 Rheumatic mitral valve disease, unspecified: Secondary | ICD-10-CM

## 2013-08-11 DIAGNOSIS — E7800 Pure hypercholesterolemia, unspecified: Secondary | ICD-10-CM

## 2013-08-11 DIAGNOSIS — I10 Essential (primary) hypertension: Secondary | ICD-10-CM | POA: Diagnosis not present

## 2013-08-11 DIAGNOSIS — I872 Venous insufficiency (chronic) (peripheral): Secondary | ICD-10-CM

## 2013-08-11 DIAGNOSIS — Z950 Presence of cardiac pacemaker: Secondary | ICD-10-CM | POA: Diagnosis not present

## 2013-08-11 DIAGNOSIS — I4891 Unspecified atrial fibrillation: Secondary | ICD-10-CM | POA: Diagnosis not present

## 2013-08-11 DIAGNOSIS — E559 Vitamin D deficiency, unspecified: Secondary | ICD-10-CM

## 2013-08-11 DIAGNOSIS — G609 Hereditary and idiopathic neuropathy, unspecified: Secondary | ICD-10-CM

## 2013-08-11 DIAGNOSIS — E039 Hypothyroidism, unspecified: Secondary | ICD-10-CM

## 2013-08-11 DIAGNOSIS — E538 Deficiency of other specified B group vitamins: Secondary | ICD-10-CM

## 2013-08-11 DIAGNOSIS — K589 Irritable bowel syndrome without diarrhea: Secondary | ICD-10-CM

## 2013-08-11 DIAGNOSIS — D126 Benign neoplasm of colon, unspecified: Secondary | ICD-10-CM

## 2013-08-11 DIAGNOSIS — K449 Diaphragmatic hernia without obstruction or gangrene: Secondary | ICD-10-CM

## 2013-08-11 DIAGNOSIS — IMO0001 Reserved for inherently not codable concepts without codable children: Secondary | ICD-10-CM

## 2013-08-11 NOTE — Patient Instructions (Signed)
Today we updated your med list in our EPIC system...    Continue your current medications the same...  Call for any questions...  Let's plan a follow up visit in 6mo, sooner if needed for problems...   

## 2013-08-11 NOTE — Progress Notes (Signed)
Subjective:    Patient ID: Jill Washington, female    DOB: 1926-06-12, 79 y.o.   MRN: [id number]  HPI 79 y/o WF here for a follow up visit... she has multiple medical problems as noted below... She is the sister of Jill Washington. Followed for general medical purposes w/ hx of Empyema & critical illness in 1982 from which she made a nice recovery, HBP, MVP, AFib & SSS w/ pacer, Hyperchol, Hypothy, HH/ IBS/ Colon Polyps, Neuropathy/ FM... she is also followed by [name] for Cards, [name] for GI, & [name] for Neuro...  ~  January 30, 2012:  83mo ROV & Jill Washington is c/o IBS symptoms- abd cramping & tendency toward diarrhea> rec to add Fiber, Metamucil, Levsin & Immodium prn; f/u w/ [name] esp in light of her c/o incont, leakage...  BP looks good on Aten50 & DiovanHCT;  On Multaq per [facility] & COUMADIN (she is due for f/u [name])...  Chol is ok on Prav40 but TG still sl elev & we reviewed low fat diet + exercise...  We reviewed prob list, meds, xrays and labs> see below for updates >>  LABS 4/13:  FLP- chol at goal on Prav40 but TG=182;  Chems- on w/ Creat=1.5 stable;  CBC- wnl;  TSH=1.41  ~  July 31, 2012:  32mo ROV & Jill Washington has a lot of aches & pains- uses the Tramadol + Tylenol w/ some relief...     BP controlled on Aten & DiovanHct, measures 110/72 today & denies CP, palpit, dizzy, SOB, edema;  Known PAF, SSS- pacer on Coumadin & Multaq [facility];  Chol is treated w/ Prav40 & her chol numbers are good, needs better low fat diet & wt reduction;  She is followed by [name] for GI & [name] for Urology- on [facility] for bladder...    We reviewed prob list, meds, xrays and labs> see below for updates >> ok 2013 Flu vaccine today...  ~  February 09, 2013:  53mo ROV & Jill Washington reports stable, various aches & pains at 86, some difficulty getting about, and we discussed exercise, using cane, etc;  We reviewed the following medical problems during today's office visit >>     HBP> on  Aten50-1.5tabs/d, DiovanHCT80-12.5 (decr from 160 by [name] 3/14); BP= 128/76 & she denies HA, visual symptoms, or change in CP/ palpit/ SOB/ edema, she is back walking 2d/wk...    MVP, AFib, SSS w/ Pacer> followed by [name] on Amio200 & Coumadin> last seen 3/14 & doing satis on Amio (off prev Multaq), Hx neg Myoview 11/11 w/o ischemia, pacer generator changed 5/13, he decr her DiovanHCT to 80-12.5 due to low BP...    Ven Insuffic> she knows to elim sodium, elev legs, wear support hose, etc...    CHOL> on Prav40; FLP 4/14 shows TChol 145, TG 190, HDL 35, LDL 72; Rec to continue same med + get on better low fat diet...    Hypothyroid> on Synthroid125; lab 4/14 shows TSH= 1.99; Rec to continue same...    GI- HH, IBS, Polyps> on Nexium40 & uses OTC Immodium prn; last EGD 2006 showed HH, otherw neg; last colon 7/13 showed severe diverticulosis & hems...    GU- freq, urge incont> on Myrbetriq50 per [name] & symptoms improved...    DJD, FM> on Tramadol50 prn; she manages fairly well w/ some stiffness & pain...    Vit D Defic> on VitD supplement; lab 4/14 shows VitD level = 39; Rec to continue daily OTC Vit D supplement.Marland KitchenMarland Kitchen  Periph Neuropathy> on Neurontin 300Bid; prev eval by [name] w/ severe periph neuropathy- on Neurontin which helps her symptoms...    Hx mild anemia & borderline B12 level> on MVI + B12 supplement; lab shows Hg= 13.4, MCV= 93 & prev B12 level repleted to 775... We reviewed prob list, meds, xrays and labs> see below for updates >> she had the 2013 flu vaccine 10/13...  LABS 4/14:  FLP- ok on Prav40 x TG=190;  Chems- ok w/ Cr=1.4;  CBC- wnl;  TSH=1.99 on [id number];  VitD=39 on OTC supplement...   ~  August 11, 2013:  55mo ROV & Jill Washington reports that she dropped a coke bottle on her right leg w/ hematoma- went to [place] now going to [facility] for treatment and improving; also notes some mild musculoskeletal back pain; We reviewed the following medical problems during today's  office visit >>     HBP> on Aten50-1.5tabs/d, DiovanHCT80-12.5; BP= 128/76 & she denies HA, visual symptoms, or change in CP/ palpit/ SOB/ edema, still walking 2d/wk...    MVP, AFib, SSS w/ Pacer> followed by DrC at [facility] on [place] Coumadin> seen 9/14 (pacer check & labs) & doing satis on Amio (off prev Multaq), Hx neg Myoview 11/11 w/o ischemia, pacer generator changed 5/13...    Ven Insuffic> she knows to elim sodium, elev legs, wear support hose, etc...    CHOL> on Prav40; FLP 4/14 shows TChol 145, TG 190, HDL 35, LDL 72; Rec to continue same med + get on better low fat diet "I gave up cheese"...    Hypothyroid> on Synthroid125; lab 9/14 showed TSH= 3.97; Rec to continue same...    GI- HH, IBS, Polyps> on Nexium40 & uses OTC Immodium prn; last EGD 2006 showed HH, otherw neg; last colon 7/13 showed severe diverticulosis & hems...    GU- freq, urge incont> on Myrbetriq50 per [name] & symptoms improved...    DJD, FM> on Tramadol50 prn (ave 2/d); she manages fairly well w/ some stiffness & pain...    Vit D Defic> on VitD supplement; lab 4/14 shows VitD level = 39; Rec to continue daily OTC Vit D supplement...    Periph Neuropathy> on Neurontin 300Bid; prev eval by [name] w/ severe periph neuropathy- on Neurontin which helps her symptoms...    Hx mild anemia & borderline B12 level> on MVI + B12 supplement; lab shows Hg= 13.4, MCV= 93 & prev B12 level repleted to 775... We reviewed prob list, meds, xrays and labs> see below for updates >> she had the 2014 Flu vaccine 10/14...  LABS 9/14:  Chems- ok x Cr=1.54, BNP=188;  TSH=3.98...         Problem List:  Hx of EMPYEMA (ICD-510.9) - hx of critical illness in 1982 w/ epiglottitis, sepsis, empyema, resp failure w/ ARDS, critical illness myopathy & polyneuropathy- from which she made a remarkable recovery... ~  CXR 1/11 showed mild cardiomeg, right pacemaker, mild basilar scarring, NAD. ~  CXR 5/13 showed cardiomeg, pacer, flattened diaph,  stable subglottic tracheal narrowing, some scarring, otherw neg/ NAD...  HYPERTENSION (ICD-401.9) >>  ~  on ATENOLOL 50mg /d &  DIOVAN/Hct 160-12.5 daily... She is supposed to monitor BP at home. ~  10/12:  BP today = 130/64, taking meds regularly, tolerating meds well... she denies HA, fatigue, visual changes, CP, palipit, dizziness, syncope, dyspnea, edema, etc... ~  4/13:  BP= 132/84 & she remains largely asymptomatic... ~  10/13:  BP= 110/72 & she remains stable... ~  4/14: on  Aten50-1.5tabs/d, DiovanHCT80-12.5 (decr from 160 by [name] 3/14); BP= 128/76 & she denies HA, visual symptoms, or change in CP/ palpit/ SOB/ edema, she is back walking 2d/wk.  MITRAL VALVE PROLAPSE (ICD-424.0) - no complaints of CP or palpit... cath in 1998 showed norm coronaries + MVP...  ~  NuclearStressTest 8/05 showed inferior wall artifact, no ischemia, EF= 69%... ~  2DEcho 4/06 by [facility] showed mild intermittent prolapse of the ant leaflet of the MV, norm LV size w/ mod ASH... ~  f/u Myoview 11/09 showed norm perfusion w/o infarct, w/o ischemia. ~  repeat 2DEcho 6/10 showed norm LVF, norm valves, DD. ~  repeat 2DEcho 10/11 showed mild conc LVH, norm LVF w/ EF>55% ~  repeat Myoview 11/11 showed no wall motion abn, no ischemia, no changes. ~  EKG 5/13 shows dual AV paced rhythm... ~  CDopplers 12/13 showed wnl- 0-39% bilat ICAstenoses... ~  2DEcho 12/13 by [name] showed mild-mod concLVH, EF=45-50%, Gr1DD, mildMR, mild dyssynergy from the pacer ~  2DEcho 10/14 showed mod conc LVH, sl decr LVF w/ EF=45-50%, Gr1DD, dyssynergy of septum, mildly thickened AoV & Mitral valve leaflets...  Hx of ATRIAL FIBRILLATION (ICD-427.31) & SICK SINUS SYNDROME (ICD-427.81) - on COUMADIN & Multaq 400mg Bid, followed by [facility]... pacemaker placed by [name] in 1998 (SSS) and followed by him... Generator changed 10/06 and Pacer checked periodically- doing well...  ~  11/10:  [name] changed Amio to [place] 400mg Bid... ~  EKG 5/13 shows dual AV  paced rhythm... ~  Subsequently the Multaq was stopped, & placed back on Amio200 per [name]... ~  EKG 9/14 showed pacer rhythm... ~  Followed by DrC at [facility] on [place] Coumadin> seen 9/14 (pacer check & labs) & doing satis on Amio (off prev Multaq), Hx neg Myoview 11/11 w/o ischemia, pacer generator changed 5/13  VENOUS INSUFFICIENCY (ICD-459.81) - she follows a low sodium diet, elevates legs, support hose when able... ~  11/11:  Adm w/ right leg cellultis, VenDopplers neg for DVT, resolved w/ Unasyn ==> Augmentin.  HYPERCHOLESTEROLEMIA (ICD-272.0) - on PRAVASTATIN 40mg Qhs + diet & exercise... ~  [place] 9/09 on diet alone showed TChol 268, TG 220, HDL 42, LDL 149... Prav40 started. ~  [place] 1/11 on Prav40 showed TChol 152, TG 109, HDL 48, LDL 82... keep same. ~  [place] 4/12 on Prav40 showed TChol 142, TG 184, HDL 36, LDL 69 ~  FLP 4/13 on Prav40 showed TChol 116, TG 182, HDL 35, LDL 45... Needs better low fat diet. ~  FLP 4/14 on Prav40 showed TChol 145, TG 190, HDL 35, LDL 72  HYPOTHYROIDISM (ICD-244.9) - on LEVOTHYROID 14mcg/d... she is due for f/u labs... ~  last labs 10/08 by [name] showed TSH= 6.31... dose incr to 125 at that time... ~  labs 9/09 on Levo125 showed TSH= 5.09 ~  labs 1/11 on Levo125 showed TSH= 4.59 ~  Labs 4/12 on Levo125 showed TSH= 1.69 ~  Labs 4/13 on Levo125 showed TSH= 1.41 ~  Labs 4/14 on Levo125 showed TSH= 1.99  HIATAL HERNIA (ICD-553.3) - on NEXIUM 40mg  Prn & she is asked to take this daily... last EGD 4/06 showed HH, otherw neg...  POLYP, COLON (ICD-211.3) - last colonoscopy 4/08 by [name] showed divertics, 20mm polyp removed= hyperplastic...  and some spasm- "IBS response" to scope... She states that [place] didn't help her cramping but Immodiium does help the diarrhea... ~  Colonoscopy 7/13 showed severe diverticulosis & hems...  FIBROMYALGIA (ICD-729.1) - she uses TRAMADOL 50mg  Prn & METHOCARBAMOL Prn for  musc spasm.  VITAMIN D DEFICIENCY (ICD-268.9) - prev on  Vit D 50K weekly & switched to 1000 u OTC daily... ~  labs 9/09 showed Vit D level = 16... start Vit D 50. ~  labs 1/11 on VitD 50K showed Vit D level = 34... OK switch to 1000 u daily. ~  Labs 4/14 showed VitD level = 35.... Continue supplement.  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 ~  11/10:  [name] tried switch Neurontin to Cymbalta for neuropathy symptoms- she reacted to the Cymbalta w/ trembling, weakness, confusion & changed back to GABAPENTIN 300mg  Qd... we discussed Neuro appt > ~  2/11:  Neuro eval [name] w/ EMG/ NCV showing severe length dependent axonal sensorimotor polyneuropathy. ~  4/14:  on Neurontin 300Bid; prev eval by [name] w/ severe periph neuropathy- on Neurontin which helps her symptoms.  BORDERLINE B12 LEVELS - rec to take Vit B12 orally ~5102mcg/d... ~  labs 1/11 showed B12= 267 (211-911) ~  labs 11/11 in hosp showed B12= 237 (she wasn't taking any B12 supplement) & rec to start 545mcg/d. ~  Labs 4/12 showed B12 level = 775, continue oral supplementation...  MILD ANEMIA - Labs have been WNL thru office, but mild anemia found 11/11 hosp for cellulitis... on Coumadin per [facility]. ~  labs 11/11 in hosp showed Hg= 11.0 ==> 10.5, Fe=54 (22%sat), Ferritin=142 ~  Labs 4/12 showed Hg= 12.9 on MVI & B12 supplementation... ~  Labs 4/13 showed Hg= 12.6 ~  Labs 4/14 showed Hg= 13.4  SKIN CANCER, HX OF (ICD-V10.83) - followed by Derm... 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  Health Maintenance - GYN= DrRKaplan but she understood him to say she didn't need f/u... had Mammogram & BMD in ?2006 but she's not sure...  takes Calcium, MVI, Vit D...   Past Surgical History  Procedure Laterality Date  . Cervical spine surgery    . Cholecystectomy  03/2005    by Dr. Dalbert Batman  . Total abdominal hysterectomy w/ bilateral salpingoophorectomy     . Colonoscopy  02/05/2007,02/05/05    Dr. Silvano Rusk  . Esophagogastroduodenoscopy  02/05/2005,01/22/90    Dr. Silvano Rusk, Dr. Rachelle Hora  . Pacemaker implanted  05/11/97,08/02/05  . Appendectomy  1971  . Tonsillectomy      Outpatient Encounter Prescriptions as of 08/11/2013  Medication Sig Dispense Refill  . amiodarone (PACERONE) 200 MG tablet Take 1 tablet (200 mg total) by mouth daily.  90 tablet  3  . atenolol (TENORMIN) 50 MG tablet 1 1/2  daily      . Cholecalciferol (VITAMIN D PO) Take 1 tablet by mouth daily.      Marland Kitchen gabapentin (NEURONTIN) 300 MG capsule TAKE 1 CAPSULE TWICE A DAY  180 capsule  1  . levothyroxine (SYNTHROID, LEVOTHROID) 125 MCG tablet TAKE 1 TABLET DAILY  90 tablet  2  . loperamide (IMODIUM) 2 MG capsule Take 2 mg by mouth 4 (four) times daily as needed.        . mirabegron ER (MYRBETRIQ) 50 MG TB24 Take 50 mg by mouth daily.      . Multiple Vitamins-Minerals (ONE-A-DAY WOMENS 50 PLUS PO) Take 1 tablet by mouth daily.      Marland Kitchen NEXIUM 40 MG capsule TAKE 1 CAPSULE DAILY BEFORE BREAKFAST  90 capsule  1  . pravastatin (PRAVACHOL) 40 MG tablet TAKE 1 TABLET DAILY  90  tablet  0  . traMADol (ULTRAM) 50 MG tablet Take 1 tablet (50 mg total) by mouth 2 (two) times daily as needed for pain.  180 tablet  1  . valsartan-hydrochlorothiazide (DIOVAN-HCT) 80-12.5 MG per tablet Take 1 tablet by mouth daily.      . vitamin B-12 (CYANOCOBALAMIN) 500 MCG tablet Take 500 mcg by mouth daily.      Marland Kitchen warfarin (COUMADIN) 2 MG tablet Take 1 tablet on Monday. Take 1/2  tablet the rest of the week.       No facility-administered encounter medications on file as of 08/11/2013.    Allergies  Allergen Reactions  . Iodinated Diagnostic Agents   . Other Itching    Dial Soap    Current Medications, Allergies, Past Medical History, Past Surgical History, Family History, and Social History were reviewed in [employer] record.    Review of Systems        See HPI  - all other systems neg except as noted...      The patient complains of decreased hearing, dyspnea on exertion, peripheral edema, muscle weakness, and difficulty walking.  The patient denies anorexia, fever, weight loss, weight gain, vision loss, hoarseness, chest pain, syncope, prolonged cough, headaches, hemoptysis, abdominal pain, melena, hematochezia, severe indigestion/heartburn, hematuria, incontinence, suspicious skin lesions, transient blindness, depression, unusual weight change, abnormal bleeding, enlarged lymph nodes, and angioedema.     Objective:   Physical Exam      WD, WN, 79 y/o WF in NAD... GENERAL:  Alert & oriented; pleasant & cooperative... HEENT:  [place]/AT, EOM-wnl, PERRLA, EACs-clear, TMs-wnl, NOSE-clear, THROAT-clear & wnl. NECK:  Supple w/ fairROM; no JVD; normal carotid impulses w/o bruits; no thyromegaly or nodules palpated; no lymphadenopathy. CHEST:  Clear to P & A; without wheezes/ rales/ or rhonchi heard... HEART:  Regular Rhythm; pacer, gr 1-2/6 SEM S4 no rubs or S3... ABDOMEN:  Soft & nontender; normal bowel sounds; no organomegaly or masses detected. EXT: without deformities, mod arthritic changes; no varicose veins/ +venous insuffic/ 1+edema. NEURO:  CN's intact;  she has a severe peripheral neuropathy w/ decr sensation in LEs, gait abn, decr DTRs. DERM:  dry skin & port-wine birthmark on left chest...  RADIOLOGY DATA:  Reviewed in the EPIC EMR & discussed w/ the patient...  LABORATORY DATA:  Reviewed in the EPIC EMR & discussed w/ the patient...   Assessment & Plan:    PULM>  She remains stable w/o resp exac, etc...  HBP>  Good control on Aten + Diovan/ HCT, asked to do better job w/ calorie & sodium restriction, get wt down, monitor BP at home etc...  CARDS>  Followed by DrC w/ PAF, Pacer for SSS, etc;  Stable on current regimen w/ Multaq, Coumadin, etc;  Continue same...  CHOL>  Chol OK on the Prav40 but incr TG & we discussed need for better low fat  diet, get wt down, etc...  HYPOTHY>  Stable on the Synthroid 154mcg/d, continue same...  Neuropathy>  She hasn't followed up w/ Neuro [name]; discussed slowly inct the Gabapentin dose if nec (currently 300mg  Qhs)...  Anemia/ B12>  Her anemia is improved w/ Hg 13.4, and B12 is now normal on oral supplement...   Patient's Medications  New Prescriptions   No medications on file  Previous Medications   AMIODARONE (PACERONE) 200 MG TABLET    Take 1 tablet (200 mg total) by mouth daily.   CHOLECALCIFEROL (VITAMIN D PO)    Take 1 tablet by mouth  daily.   ESOMEPRAZOLE (NEXIUM) 40 MG CAPSULE    Take 40 mg by mouth daily before breakfast.   GABAPENTIN (NEURONTIN) 300 MG CAPSULE    Take 300 mg by mouth 2 (two) times daily.   LEVOTHYROXINE (SYNTHROID, LEVOTHROID) 125 MCG TABLET    Take 125 mcg by mouth daily before breakfast.   LOPERAMIDE (IMODIUM) 2 MG CAPSULE    Take 2 mg by mouth 4 (four) times daily as needed for diarrhea or loose stools.    MIRABEGRON ER (MYRBETRIQ) 50 MG TB24    Take 50 mg by mouth daily.   MULTIPLE VITAMINS-MINERALS (ONE-A-DAY WOMENS 50 PLUS PO)    Take 1 tablet by mouth daily.   PRAVASTATIN (PRAVACHOL) 40 MG TABLET    Take 40 mg by mouth at bedtime.   TRAMADOL (ULTRAM) 50 MG TABLET    Take 50 mg by mouth 2 (two) times daily.   VALSARTAN-HYDROCHLOROTHIAZIDE (DIOVAN-HCT) 80-12.5 MG PER TABLET    Take 1 tablet by mouth daily.   VITAMIN B-12 (CYANOCOBALAMIN) 500 MCG TABLET    Take 500 mcg by mouth daily.   WARFARIN (COUMADIN) 2 MG TABLET    Take 1-2 mg by mouth daily. Take 1 tablet (2 mg)  on Monday. Take 1/2 tablet (1 mg) the rest of the week.  Modified Medications   Modified Medication Previous Medication   ATENOLOL (TENORMIN) 50 MG TABLET atenolol (TENORMIN) 50 MG tablet      Take 1.5 tablets (75 mg total) by mouth daily. 1 1/2  daily    Take 75 mg by mouth daily. 1 1/2  daily   GABAPENTIN (NEURONTIN) 300 MG CAPSULE gabapentin (NEURONTIN) 300 MG capsule      TAKE 1  CAPSULE TWICE A DAY    TAKE 1 CAPSULE TWICE A DAY   LEVOTHYROXINE (SYNTHROID, LEVOTHROID) 125 MCG TABLET levothyroxine (SYNTHROID, LEVOTHROID) 125 MCG tablet      TAKE 1 TABLET DAILY    TAKE 1 TABLET DAILY   NEXIUM 40 MG CAPSULE NEXIUM 40 MG capsule      TAKE 1 CAPSULE DAILY BEFORE BREAKFAST    TAKE 1 CAPSULE DAILY BEFORE BREAKFAST   PRAVASTATIN (PRAVACHOL) 40 MG TABLET pravastatin (PRAVACHOL) 40 MG tablet      TAKE 1 TABLET DAILY    TAKE 1 TABLET DAILY  Discontinued Medications   GABAPENTIN (NEURONTIN) 300 MG CAPSULE    TAKE 1 CAPSULE TWICE A DAY   PRAVASTATIN (PRAVACHOL) 40 MG TABLET    TAKE 1 TABLET DAILY   TRAMADOL (ULTRAM) 50 MG TABLET    Take 1 tablet (50 mg total) by mouth 2 (two) times daily as needed for pain.

## 2013-08-12 DIAGNOSIS — L97209 Non-pressure chronic ulcer of unspecified calf with unspecified severity: Secondary | ICD-10-CM | POA: Diagnosis not present

## 2013-08-12 DIAGNOSIS — I509 Heart failure, unspecified: Secondary | ICD-10-CM | POA: Diagnosis not present

## 2013-08-12 DIAGNOSIS — T1490XA Injury, unspecified, initial encounter: Secondary | ICD-10-CM | POA: Diagnosis not present

## 2013-08-12 DIAGNOSIS — Z7901 Long term (current) use of anticoagulants: Secondary | ICD-10-CM | POA: Diagnosis not present

## 2013-08-12 DIAGNOSIS — I10 Essential (primary) hypertension: Secondary | ICD-10-CM | POA: Diagnosis not present

## 2013-08-12 DIAGNOSIS — I499 Cardiac arrhythmia, unspecified: Secondary | ICD-10-CM | POA: Diagnosis not present

## 2013-08-17 ENCOUNTER — Ambulatory Visit (INDEPENDENT_AMBULATORY_CARE_PROVIDER_SITE_OTHER): Payer: Medicare Other | Admitting: Pharmacist Clinician (PhC)/ Clinical Pharmacy Specialist

## 2013-08-17 VITALS — BP 120/72 | HR 80

## 2013-08-17 DIAGNOSIS — I4891 Unspecified atrial fibrillation: Secondary | ICD-10-CM

## 2013-08-17 DIAGNOSIS — Z7901 Long term (current) use of anticoagulants: Secondary | ICD-10-CM

## 2013-08-19 DIAGNOSIS — E039 Hypothyroidism, unspecified: Secondary | ICD-10-CM | POA: Diagnosis not present

## 2013-08-19 DIAGNOSIS — I4891 Unspecified atrial fibrillation: Secondary | ICD-10-CM | POA: Diagnosis not present

## 2013-08-19 DIAGNOSIS — Z950 Presence of cardiac pacemaker: Secondary | ICD-10-CM | POA: Diagnosis not present

## 2013-08-19 DIAGNOSIS — Z7901 Long term (current) use of anticoagulants: Secondary | ICD-10-CM | POA: Diagnosis not present

## 2013-08-19 DIAGNOSIS — T1490XA Injury, unspecified, initial encounter: Secondary | ICD-10-CM | POA: Diagnosis not present

## 2013-08-19 DIAGNOSIS — L97209 Non-pressure chronic ulcer of unspecified calf with unspecified severity: Secondary | ICD-10-CM | POA: Diagnosis not present

## 2013-08-19 DIAGNOSIS — I10 Essential (primary) hypertension: Secondary | ICD-10-CM | POA: Diagnosis not present

## 2013-08-19 DIAGNOSIS — I499 Cardiac arrhythmia, unspecified: Secondary | ICD-10-CM | POA: Diagnosis not present

## 2013-08-19 DIAGNOSIS — I509 Heart failure, unspecified: Secondary | ICD-10-CM | POA: Diagnosis not present

## 2013-08-26 DIAGNOSIS — I10 Essential (primary) hypertension: Secondary | ICD-10-CM | POA: Diagnosis not present

## 2013-08-26 DIAGNOSIS — I509 Heart failure, unspecified: Secondary | ICD-10-CM | POA: Diagnosis not present

## 2013-08-26 DIAGNOSIS — T1490XA Injury, unspecified, initial encounter: Secondary | ICD-10-CM | POA: Diagnosis not present

## 2013-08-26 DIAGNOSIS — Z7901 Long term (current) use of anticoagulants: Secondary | ICD-10-CM | POA: Diagnosis not present

## 2013-08-26 DIAGNOSIS — I499 Cardiac arrhythmia, unspecified: Secondary | ICD-10-CM | POA: Diagnosis not present

## 2013-08-26 DIAGNOSIS — L97209 Non-pressure chronic ulcer of unspecified calf with unspecified severity: Secondary | ICD-10-CM | POA: Diagnosis not present

## 2013-08-30 ENCOUNTER — Encounter (HOSPITAL_COMMUNITY): Payer: Self-pay | Admitting: Emergency Medicine

## 2013-08-30 ENCOUNTER — Emergency Department (HOSPITAL_COMMUNITY): Payer: Medicare Other

## 2013-08-30 ENCOUNTER — Emergency Department (HOSPITAL_COMMUNITY)
Admission: EM | Admit: 2013-08-30 | Discharge: 2013-08-30 | Disposition: A | Discharge status: Home / Self Care | Payer: Medicare Other | Attending: Emergency Medicine | Admitting: Emergency Medicine

## 2013-08-30 DIAGNOSIS — R209 Unspecified disturbances of skin sensation: Secondary | ICD-10-CM | POA: Diagnosis not present

## 2013-08-30 DIAGNOSIS — R9431 Abnormal electrocardiogram [ECG] [EKG]: Secondary | ICD-10-CM | POA: Insufficient documentation

## 2013-08-30 DIAGNOSIS — R0609 Other forms of dyspnea: Secondary | ICD-10-CM | POA: Diagnosis not present

## 2013-08-30 DIAGNOSIS — Z950 Presence of cardiac pacemaker: Secondary | ICD-10-CM | POA: Insufficient documentation

## 2013-08-30 DIAGNOSIS — Z860100 Personal history of colon polyps, unspecified: Secondary | ICD-10-CM | POA: Insufficient documentation

## 2013-08-30 DIAGNOSIS — Z7901 Long term (current) use of anticoagulants: Secondary | ICD-10-CM | POA: Insufficient documentation

## 2013-08-30 DIAGNOSIS — I059 Rheumatic mitral valve disease, unspecified: Secondary | ICD-10-CM | POA: Insufficient documentation

## 2013-08-30 DIAGNOSIS — R609 Edema, unspecified: Secondary | ICD-10-CM | POA: Insufficient documentation

## 2013-08-30 DIAGNOSIS — R110 Nausea: Secondary | ICD-10-CM | POA: Insufficient documentation

## 2013-08-30 DIAGNOSIS — R531 Weakness: Secondary | ICD-10-CM

## 2013-08-30 DIAGNOSIS — I10 Essential (primary) hypertension: Secondary | ICD-10-CM | POA: Insufficient documentation

## 2013-08-30 DIAGNOSIS — R079 Chest pain, unspecified: Secondary | ICD-10-CM | POA: Insufficient documentation

## 2013-08-30 DIAGNOSIS — G609 Hereditary and idiopathic neuropathy, unspecified: Secondary | ICD-10-CM | POA: Diagnosis not present

## 2013-08-30 DIAGNOSIS — E559 Vitamin D deficiency, unspecified: Secondary | ICD-10-CM | POA: Insufficient documentation

## 2013-08-30 DIAGNOSIS — K219 Gastro-esophageal reflux disease without esophagitis: Secondary | ICD-10-CM | POA: Diagnosis not present

## 2013-08-30 DIAGNOSIS — M129 Arthropathy, unspecified: Secondary | ICD-10-CM | POA: Diagnosis not present

## 2013-08-30 DIAGNOSIS — IMO0001 Reserved for inherently not codable concepts without codable children: Secondary | ICD-10-CM | POA: Insufficient documentation

## 2013-08-30 DIAGNOSIS — Z5189 Encounter for other specified aftercare: Secondary | ICD-10-CM | POA: Insufficient documentation

## 2013-08-30 DIAGNOSIS — Z888 Allergy status to other drugs, medicaments and biological substances status: Secondary | ICD-10-CM | POA: Insufficient documentation

## 2013-08-30 DIAGNOSIS — M542 Cervicalgia: Secondary | ICD-10-CM | POA: Insufficient documentation

## 2013-08-30 DIAGNOSIS — R5381 Other malaise: Secondary | ICD-10-CM | POA: Insufficient documentation

## 2013-08-30 DIAGNOSIS — I872 Venous insufficiency (chronic) (peripheral): Secondary | ICD-10-CM | POA: Diagnosis not present

## 2013-08-30 DIAGNOSIS — Z85828 Personal history of other malignant neoplasm of skin: Secondary | ICD-10-CM | POA: Insufficient documentation

## 2013-08-30 DIAGNOSIS — K589 Irritable bowel syndrome without diarrhea: Secondary | ICD-10-CM | POA: Insufficient documentation

## 2013-08-30 DIAGNOSIS — Z87891 Personal history of nicotine dependence: Secondary | ICD-10-CM | POA: Insufficient documentation

## 2013-08-30 DIAGNOSIS — Z8709 Personal history of other diseases of the respiratory system: Secondary | ICD-10-CM | POA: Insufficient documentation

## 2013-08-30 DIAGNOSIS — E039 Hypothyroidism, unspecified: Secondary | ICD-10-CM | POA: Insufficient documentation

## 2013-08-30 DIAGNOSIS — M6281 Muscle weakness (generalized): Secondary | ICD-10-CM | POA: Insufficient documentation

## 2013-08-30 DIAGNOSIS — I4891 Unspecified atrial fibrillation: Secondary | ICD-10-CM | POA: Insufficient documentation

## 2013-08-30 DIAGNOSIS — Z8719 Personal history of other diseases of the digestive system: Secondary | ICD-10-CM | POA: Insufficient documentation

## 2013-08-30 DIAGNOSIS — E7800 Pure hypercholesterolemia, unspecified: Secondary | ICD-10-CM | POA: Diagnosis not present

## 2013-08-30 DIAGNOSIS — R011 Cardiac murmur, unspecified: Secondary | ICD-10-CM | POA: Diagnosis not present

## 2013-08-30 DIAGNOSIS — R0989 Other specified symptoms and signs involving the circulatory and respiratory systems: Secondary | ICD-10-CM | POA: Insufficient documentation

## 2013-08-30 DIAGNOSIS — R0789 Other chest pain: Secondary | ICD-10-CM | POA: Diagnosis not present

## 2013-08-30 DIAGNOSIS — Z79899 Other long term (current) drug therapy: Secondary | ICD-10-CM | POA: Insufficient documentation

## 2013-08-30 LAB — BASIC METABOLIC PANEL WITH GFR
BUN: 17 mg/dL (ref 6–23)
CO2: 25 meq/L (ref 19–32)
Calcium: 9.4 mg/dL (ref 8.4–10.5)
GFR calc non Af Amer: 34 mL/min — ABNORMAL LOW (ref >90)

## 2013-08-30 LAB — CBC
HCT: 37.9 % (ref 36.0–46.0)
Hemoglobin: 13 g/dL (ref 12.0–15.0)
MCH: 32 pg (ref 26.0–34.0)
MCHC: 34.3 g/dL (ref 30.0–36.0)
MCV: 93.3 fL (ref 78.0–100.0)
Platelets: 159 10*3/uL (ref 150–400)
RBC: 4.06 MIL/uL (ref 3.87–5.11)
RDW: 12.3 % (ref 11.5–15.5)
WBC: 5.5 10*3/uL (ref 4.0–10.5)

## 2013-08-30 LAB — PROTIME-INR
INR: 2.16 — ABNORMAL HIGH (ref 0.00–1.49)
Prothrombin Time: 23.4 seconds — ABNORMAL HIGH (ref 11.6–15.2)

## 2013-08-30 LAB — BASIC METABOLIC PANEL
Chloride: 103 mEq/L (ref 96–112)
Creatinine, Ser: 1.37 mg/dL — ABNORMAL HIGH (ref 0.50–1.10)
GFR calc Af Amer: 40 mL/min — ABNORMAL LOW (ref >90)
Glucose, Bld: 86 mg/dL (ref 70–99)
Potassium: 4.6 mEq/L (ref 3.5–5.1)
Sodium: 138 mEq/L (ref 135–145)

## 2013-08-30 LAB — POCT I-STAT TROPONIN I
Troponin i, poc: 0 ng/mL (ref 0.00–0.08)
Troponin i, poc: 0.01 ng/mL (ref 0.00–0.08)

## 2013-08-30 NOTE — ED Notes (Signed)
PT ambulated to bathroom with no dizziness or increased pain or sob.

## 2013-08-30 NOTE — ED Notes (Signed)
Patient transported to X-ray 

## 2013-08-30 NOTE — ED Provider Notes (Signed)
CSN: [id number]     Arrival date & time 08/30/13  1126 History   First MD Initiated Contact with Patient 08/30/13 1253     Chief Complaint  Patient presents with  . Arm Pain  . Neck Pain   (Consider location/radiation/quality/duration/timing/severity/associated sxs/prior Treatment) HPI Comments: The patient is a 79 year-old female with a past medical history of A-fib, SSS s/p pacer placement (2006), presenting the Emergency Department with a chief complaint of chest discomfort. The patient reports waking up with Chest pain that radiated to Left neck and jaw and to her Right upper extremity. She reports feeling extremely tired and going back to sleep.  The patient reports waking up to worsening chest pain around 0800 with associated nausea without emesis.  She also reports associated dyspnea. She reports getting lightheaded in the ED when she put her arms up while getting her Chest XR. The patient reports an overall decrease in energy for several weeks. She denies history of MI or previous heart Cath. She reports last stress was "several years" and a ECHO was done 07/2013.She denies pain or nausea in the ED. She was followed by Dr. Rollene Fare, and now has transition to Dr. Sallyanne Kuster for cardiology. Denies fever or chill, cough, congestion, abdominal pain, or headache.     Patient is a 79 y.o. female presenting with arm pain and neck pain. The history is provided by the patient. No language interpreter was used.  Arm Pain Associated symptoms include neck pain.  Neck Pain   Past Medical History  Diagnosis Date  . History of empyema of pleura   . Hypertension   . Mitral valve prolapse   . History of atrial fibrillation   . Sick sinus syndrome   . Venous insufficiency   . Hypercholesteremia   . Hypothyroidism   . Hiatal hernia   . IBS (irritable bowel syndrome)   . Colon polyps     hyperplastic  . Fibromyalgia   . Vitamin D deficiency   . Peripheral neuropathy   . Critical illness  polyneuropathy   . History of skin cancer     face  . Diverticulosis of sigmoid colon 02/05/2007    mild  . GERD (gastroesophageal reflux disease)   . Pacemaker   . Arthritis   . History of gallstones   . Fecal incontinence   . Urinary incontinence   . Blood transfusion    Past Surgical History  Procedure Laterality Date  . Cervical spine surgery    . Cholecystectomy  03/2005    by Dr. Dalbert Batman  . Total abdominal hysterectomy w/ bilateral salpingoophorectomy    . Colonoscopy  02/05/2007,02/05/05    Dr. Silvano Rusk  . Esophagogastroduodenoscopy  02/05/2005,01/22/90    Dr. Silvano Rusk, Dr. Rachelle Hora  . Pacemaker implanted  05/11/97,08/02/05  . Appendectomy  1971  . Tonsillectomy     Family History  Problem Relation Age of Onset  . Pancreatic cancer Mother   . Stomach cancer Maternal Aunt   . Heart disease Brother     x 2  . Gallbladder disease Father   . Diabetes Mother   . Diabetes Father   . Diabetes Brother     X 2   . Diabetes Sister     x 2  . Colon cancer     History  Substance Use Topics  . Smoking status: Former Smoker -- 2.00 packs/day for 6 years    Types: Cigarettes    Quit date: 10/15/1951  . Smokeless tobacco: Never  Used  . Alcohol Use: No   OB History   Grav Para Term Preterm Abortions TAB SAB Ect Mult Living                 Review of Systems  Musculoskeletal: Positive for neck pain.  All other systems reviewed and are negative.    Allergies  Iodinated diagnostic agents and Other  Home Medications   Current Outpatient Rx  Name  Route  Sig  Dispense  Refill  . amiodarone (PACERONE) 200 MG tablet   Oral   Take 1 tablet (200 mg total) by mouth daily.   90 tablet   3     [employer]   . atenolol (TENORMIN) 50 MG tablet      1 1/2  daily         . Cholecalciferol (VITAMIN D PO)   Oral   Take 1 tablet by mouth daily.         Marland Kitchen gabapentin (NEURONTIN) 300 MG capsule      TAKE 1 CAPSULE TWICE A DAY   180 capsule   1   .  levothyroxine (SYNTHROID, LEVOTHROID) 125 MCG tablet      TAKE 1 TABLET DAILY   90 tablet   2   . loperamide (IMODIUM) 2 MG capsule   Oral   Take 2 mg by mouth 4 (four) times daily as needed.           . mirabegron ER (MYRBETRIQ) 50 MG TB24   Oral   Take 50 mg by mouth daily.         . Multiple Vitamins-Minerals (ONE-A-DAY WOMENS 50 PLUS PO)   Oral   Take 1 tablet by mouth daily.         Marland Kitchen NEXIUM 40 MG capsule      TAKE 1 CAPSULE DAILY BEFORE BREAKFAST   90 capsule   1   . pravastatin (PRAVACHOL) 40 MG tablet      TAKE 1 TABLET DAILY   90 tablet   0   . traMADol (ULTRAM) 50 MG tablet   Oral   Take 1 tablet (50 mg total) by mouth 2 (two) times daily as needed for pain.   180 tablet   1   . valsartan-hydrochlorothiazide (DIOVAN-HCT) 80-12.5 MG per tablet   Oral   Take 1 tablet by mouth daily.         . vitamin B-12 (CYANOCOBALAMIN) 500 MCG tablet   Oral   Take 500 mcg by mouth daily.         Marland Kitchen warfarin (COUMADIN) 2 MG tablet      Take 1 tablet on Monday. Take 1/2  tablet the rest of the week.          BP 151/87  Pulse 72  Temp(Src) 98.1 F (36.7 C) (Oral)  Resp 18  SpO2 96% Physical Exam  Nursing note and vitals reviewed. Constitutional: She is oriented to person, place, and time. She appears well-developed and well-nourished. No distress.  HENT:  Head: Normocephalic and atraumatic.  Neck: Neck supple. No JVD present.  Cardiovascular: Normal rate and regular rhythm.   Murmur heard. Lower extremity trace pitting edema bilaterally  Pulmonary/Chest: Effort normal and breath sounds normal. No respiratory distress. She has no wheezes. She has no rales.  Abdominal: Soft. Bowel sounds are normal. She exhibits no distension. There is no tenderness. There is no rebound and no guarding.  Neurological: She is alert and oriented to person, place, and time.  Skin: Skin is warm and dry. She is not diaphoretic.  Psychiatric: She has a normal mood and  affect.    ED Course  Procedures (including critical care time) Labs Review Labs Reviewed  CBC  BASIC METABOLIC PANEL  PROTIME-INR   Imaging Review Dg Chest 2 View  08/30/2013   CLINICAL DATA:  Chest pain, left arm numbness  EXAM: CHEST  2 VIEW  COMPARISON:  02/25/2012  FINDINGS: The cardiac silhouette is enlarged. There is flattening of the left hemidiaphragm. No focal region of consolidation or focal infiltrates. A right-sided cardiac pacing unit is appreciated, dual lead, with lead tips projecting in the region the right atrium and right ventricle.  IMPRESSION: No active cardiopulmonary disease.   Electronically Signed   By: Margaree Mackintosh M.D.   On: 08/30/2013 12:58    EKG Interpretation    Date/Time:  Monday August 30 2013 11:30:12 EST Ventricular Rate:  70 PR Interval:  170 QRS Duration: 210 QT Interval:  488 QTC Calculation: 527 R Axis:   -77 Text Interpretation:  AV dual-paced rhythm Abnormal ECG No significant change since last tracing            MDM   1. Chest pain   2. Weakness   PT with a history of HTN, SSS, s/p pacer presents with chest pain and fatigue.  Patient currently asymptomatic in the ED.  Labs and imaging ordered.  Patient decline medication at this time.  EKG without significant change.  Troponin negative. EMR: ECHO 08/04/13 EF 45-50% Discussed patient condition with Dr. Dorna Mai and agrees with current work Discussed patient history with Dr. Gwenlyn Found.  He reports she can call tomorrow for a follow up appointment and discharge home. Discussed lab results, imagine results, and treatment plan with the patient.  She reports understanding and no other concerns at this time.   Patient is stable for discharge at this time.     Lorrine Kin, PA-C 09/01/13 [phone]

## 2013-08-30 NOTE — ED Notes (Signed)
Pt c/o left sided neck and arm pain starting at 0500 this am that is now resolved; pt sts some dizziness and nausea with event

## 2013-09-02 DIAGNOSIS — L97209 Non-pressure chronic ulcer of unspecified calf with unspecified severity: Secondary | ICD-10-CM | POA: Diagnosis not present

## 2013-09-02 DIAGNOSIS — I509 Heart failure, unspecified: Secondary | ICD-10-CM | POA: Diagnosis not present

## 2013-09-02 DIAGNOSIS — I10 Essential (primary) hypertension: Secondary | ICD-10-CM | POA: Diagnosis not present

## 2013-09-02 DIAGNOSIS — T1490XA Injury, unspecified, initial encounter: Secondary | ICD-10-CM | POA: Diagnosis not present

## 2013-09-02 DIAGNOSIS — I499 Cardiac arrhythmia, unspecified: Secondary | ICD-10-CM | POA: Diagnosis not present

## 2013-09-02 DIAGNOSIS — Z7901 Long term (current) use of anticoagulants: Secondary | ICD-10-CM | POA: Diagnosis not present

## 2013-09-02 NOTE — ED Provider Notes (Signed)
Medical screening examination/treatment/procedure(s) were conducted as a shared visit with non-physician practitioner(s) and myself.  I personally evaluated the patient during the encounter.  EKG Interpretation    Date/Time:  Monday August 30 2013 11:30:12 EST Ventricular Rate:  70 PR Interval:  170 QRS Duration: 210 QT Interval:  488 QTC Calculation: 527 R Axis:   -77 Text Interpretation:  AV dual-paced rhythm Abnormal ECG No significant change since last tracing            Pt with h/o SSS, pacemaker, no sig CAD presents with 4-5 hours of left side arm and neck pain, now resolved.  PT has been pain and symptom free for 5 hours, plan to perform serial troponins times 2.  No change in ECG.  Will discuss with [facility] and ensure appropriate follow up as outpt.  Pt is relieved, does not wish to be admitted.    Jill Washington. Jade Burkard, MD 09/02/13 1700

## 2013-09-16 ENCOUNTER — Ambulatory Visit (INDEPENDENT_AMBULATORY_CARE_PROVIDER_SITE_OTHER): Payer: Medicare Other | Admitting: Pharmacist Clinician (PhC)/ Clinical Pharmacy Specialist

## 2013-09-16 VITALS — BP 120/70 | HR 68

## 2013-09-16 DIAGNOSIS — I4891 Unspecified atrial fibrillation: Secondary | ICD-10-CM | POA: Diagnosis not present

## 2013-09-16 DIAGNOSIS — I509 Heart failure, unspecified: Secondary | ICD-10-CM | POA: Diagnosis not present

## 2013-09-16 DIAGNOSIS — Z7901 Long term (current) use of anticoagulants: Secondary | ICD-10-CM | POA: Diagnosis not present

## 2013-09-16 DIAGNOSIS — L97209 Non-pressure chronic ulcer of unspecified calf with unspecified severity: Secondary | ICD-10-CM | POA: Diagnosis not present

## 2013-09-16 DIAGNOSIS — I10 Essential (primary) hypertension: Secondary | ICD-10-CM | POA: Diagnosis not present

## 2013-09-16 DIAGNOSIS — I499 Cardiac arrhythmia, unspecified: Secondary | ICD-10-CM | POA: Diagnosis not present

## 2013-09-16 DIAGNOSIS — Z950 Presence of cardiac pacemaker: Secondary | ICD-10-CM | POA: Diagnosis not present

## 2013-09-16 DIAGNOSIS — T1490XA Injury, unspecified, initial encounter: Secondary | ICD-10-CM | POA: Diagnosis not present

## 2013-09-16 DIAGNOSIS — E039 Hypothyroidism, unspecified: Secondary | ICD-10-CM | POA: Diagnosis not present

## 2013-09-16 LAB — POCT INR: INR: 1.9

## 2013-09-20 ENCOUNTER — Ambulatory Visit (INDEPENDENT_AMBULATORY_CARE_PROVIDER_SITE_OTHER): Payer: Medicare Other | Admitting: Cardiovascular Disease

## 2013-09-20 ENCOUNTER — Encounter: Payer: Self-pay | Admitting: Cardiovascular Disease

## 2013-09-20 VITALS — BP 153/78 | HR 70 | Ht 67.0 in | Wt 192.2 lb

## 2013-09-20 DIAGNOSIS — R079 Chest pain, unspecified: Secondary | ICD-10-CM

## 2013-09-20 DIAGNOSIS — E7800 Pure hypercholesterolemia, unspecified: Secondary | ICD-10-CM | POA: Diagnosis not present

## 2013-09-20 DIAGNOSIS — Z950 Presence of cardiac pacemaker: Secondary | ICD-10-CM

## 2013-09-20 DIAGNOSIS — I10 Essential (primary) hypertension: Secondary | ICD-10-CM

## 2013-09-20 DIAGNOSIS — I4891 Unspecified atrial fibrillation: Secondary | ICD-10-CM

## 2013-09-20 NOTE — Assessment & Plan Note (Addendum)
Recent comprehensive pacemaker check shows normal function. Her device was initially implanted in 1998. I performed her most recent generator change out in 2013. Most recently she had an atrial fibrillation burden of 11% and atrial pacing occurred the rest of the time. She is 100% ventricular paced secondary to complete heart block.  No changes were made to pacing settings today.

## 2013-09-20 NOTE — Assessment & Plan Note (Signed)
Overall burden of paroxysmal atrial fibrillation is about 11%. Treatment with amiodarone has led to a marked reduction in the frequency of atrial fibrillation. She needs liver function tests and thyroid function tests periodically. These were last evaluated on September 19 and were all within normal range. Should her rate control is not an issue since she has complete heart block. She is on appropriate anticoagulation with warfarin.

## 2013-09-20 NOTE — Assessment & Plan Note (Signed)
Her chest discomfort was compatible with angina pectoris. Unfortunately the electrocardiogram is useless due to the presence of ventricular pacing. She had normal coronary angiography but this was 17 years ago. Have recommended that she have a myocardial perfusion study (Lexiscan).

## 2013-09-20 NOTE — Assessment & Plan Note (Signed)
Cholesterol levels are satisfactory but the triglycerides are borderline elevated when last checked. She is on pravastatin. Note normal coronary arteries by angiography in 1998. Target LDL cholesterol 100 mg/dL her last period

## 2013-09-20 NOTE — Assessment & Plan Note (Signed)
Her blood pressure is clearly elevated today but she states this is quite unusual for her. Her typical systolic blood pressures in the mid-130s. No changes are made to her chronic medications

## 2013-09-20 NOTE — Patient Instructions (Signed)
Your physician has requested that you have a lexiscan myoview. For further information please visit [email or web address]. Please follow instruction sheet, as given.  Dr. Loletha Grayer  recommends that you schedule a follow-up appointment in: early January 2015 for test results and pacemaker check.

## 2013-09-20 NOTE — Progress Notes (Signed)
Patient ID: Jill Washington, female   DOB: 29-Oct-1925, 79 y.o.   MRN: [id number]      Reason for office visit Atrial fibrillation, complete heart block, pacemaker followup  Jill Washington is here to establish new cardiology followup after Dr. Georgiann Mccoy retirement. However I did meet her once before when I performed her pacemaker generator change out in 2013. She is pacemaker dependent due to complete heart block. She has frequently recurring paroxysmal atrial fibrillation and it is not clear exactly how symptomatic the arrhythmia is. She has complete heart block and is 100% ventricular paced so rapid ventricular rates are never a problem. At one point she was treated with multaq with very poor results, with a burden of atrial fibrillation that approached 40-50%. After switching to amiodarone the arrhythmia has been controlled much better. Most recently, her pacemaker check in September showed a burden of atrial fibrillation of only 11% and falling. She has not had any clear side effects from amiodarone.  November 17 she presented to the emergency room with complaints of chest discomfort discomfort left side of jaw and neck and left upper tremor he. He associates some nausea but no vomiting as well as dyspnea. Her electrocardiogram shows atrial pacing and ventricular pacing and she was kept in the emergency room for several cardiac enzyme assays which were all normal. She has few complaints today other than a mild headache which started last night. She has chronic stasis dermatitis and mild lower showed edema related to peripheral venous insufficiency   Allergies  Allergen Reactions  . Cymbalta [Duloxetine Hcl]     Could not think straight  . Iodinated Diagnostic Agents     unknown  . Other Itching    Dial Soap    Current Outpatient Prescriptions  Medication Sig Dispense Refill  . amiodarone (PACERONE) 200 MG tablet Take 1 tablet (200 mg total) by mouth daily.  90 tablet  3  .  atenolol (TENORMIN) 50 MG tablet Take 75 mg by mouth daily. 1 1/2  daily      . Cholecalciferol (VITAMIN D PO) Take 1 tablet by mouth daily.      Marland Kitchen esomeprazole (NEXIUM) 40 MG capsule Take 40 mg by mouth daily before breakfast.      . gabapentin (NEURONTIN) 300 MG capsule Take 300 mg by mouth 2 (two) times daily.      Marland Kitchen levothyroxine (SYNTHROID, LEVOTHROID) 125 MCG tablet Take 125 mcg by mouth daily before breakfast.      . loperamide (IMODIUM) 2 MG capsule Take 2 mg by mouth 4 (four) times daily as needed for diarrhea or loose stools.       . mirabegron ER (MYRBETRIQ) 50 MG TB24 Take 50 mg by mouth daily.      . Multiple Vitamins-Minerals (ONE-A-DAY WOMENS 50 PLUS PO) Take 1 tablet by mouth daily.      . pravastatin (PRAVACHOL) 40 MG tablet Take 40 mg by mouth at bedtime.      . traMADol (ULTRAM) 50 MG tablet Take 50 mg by mouth 2 (two) times daily.      . valsartan-hydrochlorothiazide (DIOVAN-HCT) 80-12.5 MG per tablet Take 1 tablet by mouth daily.      . vitamin B-12 (CYANOCOBALAMIN) 500 MCG tablet Take 500 mcg by mouth daily.      Marland Kitchen warfarin (COUMADIN) 2 MG tablet Take 1-2 mg by mouth daily. Take 1 tablet (2 mg)  on Monday. Take 1/2 tablet (1 mg) the rest of the week.  No current facility-administered medications for this visit.    Past Medical History  Diagnosis Date  . History of empyema of pleura   . Hypertension   . Mitral valve prolapse   . History of atrial fibrillation   . Sick sinus syndrome   . Venous insufficiency   . Hypercholesteremia   . Hypothyroidism   . Hiatal hernia   . IBS (irritable bowel syndrome)   . Colon polyps     hyperplastic  . Fibromyalgia   . Vitamin D deficiency   . Peripheral neuropathy   . Critical illness polyneuropathy   . History of skin cancer     face  . Diverticulosis of sigmoid colon 02/05/2007    mild  . GERD (gastroesophageal reflux disease)   . Pacemaker   . Arthritis   . History of gallstones   . Fecal incontinence   .  Urinary incontinence   . Blood transfusion     Past Surgical History  Procedure Laterality Date  . Cervical spine surgery    . Cholecystectomy  03/2005    by Dr. Dalbert Batman  . Total abdominal hysterectomy w/ bilateral salpingoophorectomy    . Colonoscopy  02/05/2007,02/05/05    Dr. Silvano Rusk  . Esophagogastroduodenoscopy  02/05/2005,01/22/90    Dr. Silvano Rusk, Dr. Rachelle Hora  . Pacemaker implanted  05/11/97,08/02/05  . Appendectomy  1971  . Tonsillectomy      Family History  Problem Relation Age of Onset  . Pancreatic cancer Mother   . Stomach cancer Maternal Aunt   . Heart disease Brother     x 2  . Gallbladder disease Father   . Diabetes Mother   . Diabetes Father   . Diabetes Brother     X 2   . Diabetes Sister     x 2  . Colon cancer      History   Social History  . Marital Status: Widowed    Spouse Name: N/A    Number of Children: 3  . Years of Education: N/A   Occupational History  . retired    Social History Main Topics  . Smoking status: Former Smoker -- 2.00 packs/day for 6 years    Types: Cigarettes    Quit date: 10/15/1951  . Smokeless tobacco: Never Used  . Alcohol Use: No  . Drug Use: No  . Sexual Activity: Not on file   Other Topics Concern  . Not on file   Social History Narrative   She is a widow, one son to daughters. She is retired.    Review of systems: The patient specifically denies any chest pain at rest or with exertion, dyspnea at rest or with exertion, orthopnea, paroxysmal nocturnal dyspnea, syncope, palpitations, focal neurological deficits, intermittent claudication, lower extremity edema, unexplained weight gain, cough, hemoptysis or wheezing.  The patient also denies abdominal pain, nausea, vomiting, dysphagia, diarrhea, constipation, polyuria, polydipsia, dysuria, hematuria, frequency, urgency, abnormal bleeding or bruising, fever, chills, unexpected weight changes, mood swings, change in skin or hair texture, change in  voice quality, auditory or visual problems, allergic reactions or rashes, new musculoskeletal complaints other than usual "aches and pains".   PHYSICAL EXAM BP 153/78  Pulse 70  Ht 5\' 7"  (1.702 m)  Wt 192 lb 3.2 oz (87.181 kg)  BMI 30.10 kg/m2  General: Alert, oriented x3, no distress Head: no evidence of trauma, PERRL, EOMI, no exophtalmos or lid lag, no myxedema, no xanthelasma; normal ears, nose and oropharynx Neck: normal jugular venous pulsations  and no hepatojugular reflux; brisk carotid pulses without delay and no carotid bruits Chest: clear to auscultation, no signs of consolidation by percussion or palpation, normal fremitus, symmetrical and full respiratory excursions, right subclavian pacemaker site appears healthy Cardiovascular: normal position and quality of the apical impulse, regular rhythm, normal first and second heart sounds, no murmurs, rubs or gallops Abdomen: no tenderness or distention, no masses by palpation, no abnormal pulsatility or arterial bruits, normal bowel sounds, no hepatosplenomegaly Extremities: Upper pigmentation of chronic stasis dermatitis bilaterally, healing traumatic wound on the anterior right shin; prominent varicose veins bilaterally; no clubbing, cyanosis or edema; 2+ radial, ulnar and brachial pulses bilaterally; 2+ right femoral, posterior tibial and dorsalis pedis pulses; 2+ left femoral, posterior tibial and dorsalis pedis pulses; no subclavian or femoral bruits Neurological: grossly nonfocal   EKG: Atrial ventricular sequential pacing  Lipid Panel     Component Value Date/Time   CHOL 145 02/11/2013 0827   TRIG 190.0* 02/11/2013 0827   HDL 34.80* 02/11/2013 0827   CHOLHDL 4 02/11/2013 0827   VLDL 38.0 02/11/2013 0827   LDLCALC 72 02/11/2013 0827    BMET    Component Value Date/Time   NA 138 08/30/2013 1137   K 4.6 08/30/2013 1137   CL 103 08/30/2013 1137   CO2 25 08/30/2013 1137   GLUCOSE 86 08/30/2013 1137   BUN 17 08/30/2013 1137    CREATININE 1.37* 08/30/2013 1137   CREATININE 1.54* 07/02/2013 1522   CALCIUM 9.4 08/30/2013 1137   GFRNONAA 34* 08/30/2013 1137   GFRAA 40* 08/30/2013 1137     ASSESSMENT AND PLAN Pacemaker -  St. Jude leads implanted in 1998 generator change 2006 and 2013 Recent comprehensive pacemaker check shows normal function. Her device was initially implanted in 1998. I performed her most recent generator change out in 2013. Most recently she had an atrial fibrillation burden of 11% and atrial pacing occurred the rest of the time. She is 100% ventricular paced secondary to complete heart block.  No changes were made to pacing settings today.  Atrial fibrillation Overall burden of paroxysmal atrial fibrillation is about 11%. Treatment with amiodarone has led to a marked reduction in the frequency of atrial fibrillation. She needs liver function tests and thyroid function tests periodically. These were last evaluated on September 19 and were all within normal range. Should her rate control is not an issue since she has complete heart block. She is on appropriate anticoagulation with warfarin.  HYPERCHOLESTEROLEMIA Cholesterol levels are satisfactory but the triglycerides are borderline elevated when last checked. She is on pravastatin. Note normal coronary arteries by angiography in 1998. Target LDL cholesterol 100 mg/dL her last period  HYPERTENSION Her blood pressure is clearly elevated today but she states this is quite unusual for her. Her typical systolic blood pressures in the mid-130s. No changes are made to her chronic medications  Chest pain at rest Her chest discomfort was compatible with angina pectoris. Unfortunately the electrocardiogram is useless due to the presence of ventricular pacing. She had normal coronary angiography but this was 17 years ago. Have recommended that she have a myocardial perfusion study (Lexiscan).   Orders Placed This Encounter  Procedures  . Myocardial Perfusion  Imaging   No orders of the defined types were placed in this encounter.   Patient Instructions  Your physician has requested that you have a lexiscan myoview. For further information please visit [email or web address]. Please follow instruction sheet, as given.  Dr. Loletha Grayer  recommends that you schedule a  follow-up appointment in: early January 2015 for test results and pacemaker check.     Holli Humbles, MD, [place] [phone] office [phone] pager

## 2013-09-21 ENCOUNTER — Encounter: Payer: Self-pay | Admitting: Cardiovascular Disease

## 2013-09-24 ENCOUNTER — Ambulatory Visit (HOSPITAL_COMMUNITY)
Admission: RE | Admit: 2013-09-24 | Discharge: 2013-09-24 | Disposition: A | Discharge status: Home / Self Care | Payer: Medicare Other | Source: Ambulatory Visit | Attending: Cardiovascular Disease | Admitting: Cardiovascular Disease

## 2013-09-24 DIAGNOSIS — E663 Overweight: Secondary | ICD-10-CM | POA: Diagnosis not present

## 2013-09-24 DIAGNOSIS — I4891 Unspecified atrial fibrillation: Secondary | ICD-10-CM | POA: Diagnosis not present

## 2013-09-24 DIAGNOSIS — I10 Essential (primary) hypertension: Secondary | ICD-10-CM | POA: Insufficient documentation

## 2013-09-24 DIAGNOSIS — Z87891 Personal history of nicotine dependence: Secondary | ICD-10-CM | POA: Diagnosis not present

## 2013-09-24 DIAGNOSIS — R0609 Other forms of dyspnea: Secondary | ICD-10-CM | POA: Diagnosis not present

## 2013-09-24 DIAGNOSIS — R079 Chest pain, unspecified: Secondary | ICD-10-CM | POA: Insufficient documentation

## 2013-09-24 DIAGNOSIS — I872 Venous insufficiency (chronic) (peripheral): Secondary | ICD-10-CM | POA: Insufficient documentation

## 2013-09-24 DIAGNOSIS — Z8249 Family history of ischemic heart disease and other diseases of the circulatory system: Secondary | ICD-10-CM | POA: Insufficient documentation

## 2013-09-24 DIAGNOSIS — R42 Dizziness and giddiness: Secondary | ICD-10-CM | POA: Diagnosis not present

## 2013-09-24 DIAGNOSIS — Z950 Presence of cardiac pacemaker: Secondary | ICD-10-CM | POA: Insufficient documentation

## 2013-09-24 DIAGNOSIS — R0989 Other specified symptoms and signs involving the circulatory and respiratory systems: Secondary | ICD-10-CM | POA: Insufficient documentation

## 2013-09-24 MED ORDER — TECHNETIUM TC 99M SESTAMIBI GENERIC - CARDIOLITE
30.9000 | Freq: Once | INTRAVENOUS | Status: AC | PRN
  Administered 2013-09-24: 30.9 via INTRAVENOUS

## 2013-09-24 MED ORDER — REGADENOSON 0.4 MG/5ML IV SOLN
0.4000 mg | Freq: Once | INTRAVENOUS | Status: AC
  Administered 2013-09-24: 0.4 mg via INTRAVENOUS

## 2013-09-24 MED ORDER — TECHNETIUM TC 99M SESTAMIBI GENERIC - CARDIOLITE
10.5000 | Freq: Once | INTRAVENOUS | Status: AC | PRN
  Administered 2013-09-24: 11 via INTRAVENOUS

## 2013-09-24 NOTE — Procedures (Addendum)
[facility] [address] [place] [address] [id number]  Cardiology Nuclear Med Study  Jill Washington is a 79 y.o. female     MRN : [id number]     DOB: 10-Jul-1926  Procedure Date: 09/24/2013  Nuclear Med Background Indication for Stress Test:  Evaluation for Ischemia and Post Hospital History:  MVP and PACER;A-FIB;SSS;VENOUS INSUFICIENCY; Cardiac Risk Factors: Family History - CAD, History of Smoking, Hypertension, Lipids and Overweight  Symptoms:  Chest Pain, Dizziness, DOE and Light-Headedness   Nuclear Pre-Procedure Caffeine/Decaff Intake:  1:00am NPO After: 11AM   IV Site: R Forearm  IV 0.9% NS with Angio Cath:  22g  Chest Size (in):  N/A IV Started by: Azucena Cecil, RN  Height: 5\' 7"  (1.702 m)  Cup Size: C  BMI:  Body mass index is 30.06 kg/(m^2). Weight:  192 lb (87.091 kg)   Tech Comments:  N/A     Nuclear Med Study 1 or 2 day study: 1 day  Stress Test Type:  [place]  Order Authorizing Provider:  Dwyane Dee, MD   Resting Radionuclide: Technetium 8m Sestamibi  Resting Radionuclide Dose: 10.5 mCi   Stress Radionuclide:  Technetium 33m Sestamibi  Stress Radionuclide Dose: 30.9 mCi           Stress Protocol Rest HR: 70 Stress HR: 70  Rest BP: 156/100 Stress BP: 175/81  Exercise Time (min): n/a METS: n/a   Predicted Max HR: 135 bpm % Max HR: 53.33 bpm Rate Pressure Product: 12600  Dose of Adenosine (mg):  n/a Dose of Lexiscan: 0.4 mg  Dose of Atropine (mg): n/a Dose of Dobutamine: n/a mcg/kg/min (at max HR)  Stress Test Technologist: Leane Para, CCT Nuclear Technologist: Imagene Riches, CNMT   Rest Procedure:  Myocardial perfusion imaging was performed at rest 45 minutes following the intravenous administration of Technetium 31m Sestamibi. Stress Procedure:  The patient received IV Lexiscan 0.4 mg over 15-seconds.  Technetium 18m Sestamibi injected at 30-seconds.  There were no significant  changes with Lexiscan.  Quantitative spect images were obtained after a 45 minute delay.  Transient Ischemic Dilatation (Normal <1.22):  1.03 Lung/Heart Ratio (Normal <0.45):  0.26 QGS EDV:  79 ml QGS ESV:  33 ml LV Ejection Fraction: 58%     Rest ECG: AV sequential paced  Stress ECG: No significant change from baseline ECG  QPS Raw Data Images:  Normal; no motion artifact; normal heart/lung ratio. Stress Images:  Normal homogeneous uptake in all areas of the myocardium. Rest Images:  Normal homogeneous uptake in all areas of the myocardium. Subtraction (SDS):  No evidence of ischemia. LV Wall Motion:  NL LV Function; NL Wall Motion  Impression Exercise Capacity:  Lexiscan with no exercise. BP Response:  Normal blood pressure response. Clinical Symptoms:  No significant symptoms noted. ECG Impression:  No significant ECG changes with Lexiscan. Comparison with Prior Nuclear Study: No significant change from previous study   Overall Impression:  Normal stress nuclear study.   Sanda Klein, MD  09/24/2013 4:12 PM

## 2013-09-26 ENCOUNTER — Other Ambulatory Visit: Payer: Self-pay | Admitting: Pulmonary Disease

## 2013-10-15 ENCOUNTER — Other Ambulatory Visit: Payer: Self-pay | Admitting: Pulmonary Disease

## 2013-10-17 ENCOUNTER — Encounter: Payer: Self-pay | Admitting: *Deleted

## 2013-10-18 ENCOUNTER — Encounter: Payer: Self-pay | Admitting: Cardiovascular Disease

## 2013-10-18 ENCOUNTER — Ambulatory Visit (INDEPENDENT_AMBULATORY_CARE_PROVIDER_SITE_OTHER): Payer: Medicare Other | Admitting: Pharmacist Clinician (PhC)/ Clinical Pharmacy Specialist

## 2013-10-18 ENCOUNTER — Ambulatory Visit (INDEPENDENT_AMBULATORY_CARE_PROVIDER_SITE_OTHER): Payer: Medicare Other | Admitting: Cardiovascular Disease

## 2013-10-18 VITALS — BP 120/70 | HR 72 | Ht 67.0 in | Wt 195.7 lb

## 2013-10-18 VITALS — BP 130/68 | HR 72

## 2013-10-18 DIAGNOSIS — Z79899 Other long term (current) drug therapy: Secondary | ICD-10-CM | POA: Diagnosis not present

## 2013-10-18 DIAGNOSIS — Z7901 Long term (current) use of anticoagulants: Secondary | ICD-10-CM | POA: Diagnosis not present

## 2013-10-18 DIAGNOSIS — R5381 Other malaise: Secondary | ICD-10-CM | POA: Diagnosis not present

## 2013-10-18 DIAGNOSIS — Z9229 Personal history of other drug therapy: Secondary | ICD-10-CM | POA: Diagnosis not present

## 2013-10-18 DIAGNOSIS — I4891 Unspecified atrial fibrillation: Secondary | ICD-10-CM | POA: Diagnosis not present

## 2013-10-18 DIAGNOSIS — R5383 Other fatigue: Secondary | ICD-10-CM | POA: Diagnosis not present

## 2013-10-18 DIAGNOSIS — R0602 Shortness of breath: Secondary | ICD-10-CM | POA: Diagnosis not present

## 2013-10-18 LAB — POCT INR: INR: 2.6

## 2013-10-18 LAB — PACEMAKER DEVICE OBSERVATION

## 2013-10-18 NOTE — Progress Notes (Signed)
Patient ID: Jill Washington, female   DOB: May 30, 1926, 80 y.o.   MRN: [id number]  Chief Complaint  Patient presents with  . Follow-up    Nuclear Study results.  No complaints of chest pain, SOB or edema.  Occas. dizziness she feels is caused by meds.    HPI Jill Washington is a 80 y.o. female with h/o atrial fibrillation on coumadin, complete heart block and pacemaker who presents for follow up on atrial fibrillation and stress test results.  She had a normal lexiscan stress test on 09/24/13 for evaluation of episode of chest pain at rest. Since then, she reports doing well. She denies any significant chest pain, shortness of breath or palpitations. She has been tending to her daily chores which include vacuuming, cleaning and cooking. She denies significant change in lower extremity swelling. She continues to have venous insufficiency skin changes.  Regarding her atrial fibrillation, at her last office visit on 09/20/13, she had a paroxysmal atrial fibrillation burden of 11%, and on today's check this has improved further down to about 5%. She has been taking amiodarone and coumadin. She used to be on multaq which did not optimally control atrial fibrillation burden with a burden of 40-50%. She has complete heart block with 100% ventricular pacing.  Past Medical History  Diagnosis Date  . History of empyema of pleura   . Hypertension   . Mitral valve prolapse   . History of atrial fibrillation   . Sick sinus syndrome   . Venous insufficiency   . Hypercholesteremia   . Hypothyroidism   . Hiatal hernia   . IBS (irritable bowel syndrome)   . Colon polyps     hyperplastic  . Fibromyalgia   . Vitamin D deficiency   . Peripheral neuropathy   . Critical illness polyneuropathy   . History of skin cancer     face  . Diverticulosis of sigmoid colon 02/05/2007    mild  . GERD (gastroesophageal reflux disease)   . Pacemaker   . Arthritis   . History of gallstones   . Fecal incontinence   .  Urinary incontinence   . Blood transfusion     Past Surgical History  Procedure Laterality Date  . Cervical spine surgery    . Cholecystectomy  03/2005    by Dr. Dalbert Batman  . Total abdominal hysterectomy w/ bilateral salpingoophorectomy    . Colonoscopy  02/05/2007,02/05/05    Dr. Silvano Rusk  . Esophagogastroduodenoscopy  02/05/2005,01/22/90    Dr. Silvano Rusk, Dr. Rachelle Hora  . Pacemaker implanted  05/11/97,08/02/05    [employer]  . Appendectomy  1971  . Tonsillectomy    . Cardiac catheterization  05/10/1997    Normal coronaries, MVP  . Nm myocar perf wall motion  08/24/2010    Normal    Family History  Problem Relation Age of Onset  . Pancreatic cancer Mother   . Stomach cancer Maternal Aunt   . Heart disease Brother     x 2  . Gallbladder disease Father   . Diabetes Mother   . Diabetes Father   . Diabetes Brother     X 2   . Diabetes Sister     x 2  . Colon cancer      Social History History  Substance Use Topics  . Smoking status: Former Smoker -- 2.00 packs/day for 6 years    Types: Cigarettes    Quit date: 10/15/1951  . Smokeless tobacco: Never Used  . Alcohol Use:  No    Allergies  Allergen Reactions  . Cymbalta [Duloxetine Hcl]     Could not think straight  . Iodinated Diagnostic Agents     unknown  . Other Itching    Dial Soap    Current Outpatient Prescriptions  Medication Sig Dispense Refill  . amiodarone (PACERONE) 200 MG tablet Take 1 tablet (200 mg total) by mouth daily.  90 tablet  3  . atenolol (TENORMIN) 50 MG tablet Take 75 mg by mouth daily. 1 1/2  daily      . Cholecalciferol (VITAMIN D PO) Take 1 tablet by mouth daily.      Marland Kitchen esomeprazole (NEXIUM) 40 MG capsule Take 40 mg by mouth daily before breakfast.      . gabapentin (NEURONTIN) 300 MG capsule Take 300 mg by mouth 2 (two) times daily.      Marland Kitchen levothyroxine (SYNTHROID, LEVOTHROID) 125 MCG tablet Take 125 mcg by mouth daily before breakfast.      . loperamide (IMODIUM) 2 MG capsule  Take 2 mg by mouth 4 (four) times daily as needed for diarrhea or loose stools.       . mirabegron ER (MYRBETRIQ) 50 MG TB24 Take 50 mg by mouth daily.      . Multiple Vitamins-Minerals (ONE-A-DAY WOMENS 50 PLUS PO) Take 1 tablet by mouth daily.      . pravastatin (PRAVACHOL) 40 MG tablet Take 40 mg by mouth at bedtime.      . traMADol (ULTRAM) 50 MG tablet Take 50 mg by mouth 2 (two) times daily.      . valsartan-hydrochlorothiazide (DIOVAN-HCT) 80-12.5 MG per tablet Take 1 tablet by mouth daily.      . vitamin B-12 (CYANOCOBALAMIN) 500 MCG tablet Take 500 mcg by mouth daily.      Marland Kitchen warfarin (COUMADIN) 2 MG tablet Take 1-2 mg by mouth daily. Take 1 tablet (2 mg)  on Monday. Take 1/2 tablet (1 mg) the rest of the week.       No current facility-administered medications for this visit.    Review of Systems Review of Systems Negative except per HPI Blood pressure 120/70, pulse 72, height 5\' 7"  (1.702 m), weight 195 lb 11.2 oz (88.769 kg).  Physical Exam Physical Exam General: pleasant, elderly lady, in no acute distress Head: atraumatic, PERRLA, EOMI Neck: no JVD appreciated, no carotid bruit bilaterally CV: S1S2, regular rate and rhythm, no murmur, no gallop Chest: clear to auscultation bilaterally, normal work of breathing, no wheezing or crackles.  Lower extremities: venous stasis skin changes bilaterally, +2 pitting pedal edema bilaterally  Neuro: no focal deficits  Data Reviewed Social research officer, government Interrogation: 5.4% atrial fibrillation burden  Lexiscan stress test: 09/24/13: normal stress nuclear study  Assessment    80 y.o. female with h/o atrial fibrillation, complete heart block and pacemaker ([employer] leads implanted in 1998 with generator change 2006 and 2013)    Plan    Atrial Fibrillation:  Overall atrial fibrillation burden of 5.4% which is significantly improved from her previous 11%. Overall much improved in the last year on amiodarone. Continue  amiodarone 200mg  daily. Check TSH, free T4 and CMP. Also check PFT's with DLCO. Discussed need for yearly ophthalmological exam. Continue coumadin and amiodarone.   Chest pain at rest: s/p normal nuclear lexiscan stress test in December 2014.   Hypertension: well controlled today at 120/70. Continue valsartan/hctz 80/12.5mg .      Jill Washington 10/18/2013, 4:15 PM  I have seen and examined the  patient along with Marella Chimes, MD.  I have reviewed the chart, notes and new data.  I agree with resident's note. Jill Washington feels better on treatment with amiodarone, and despite the fact that she was never aware of palpitations with atrial fibrillation. She is less dyspneic and has more energy. We'll plan to continue amiodarone therapy long term but discussed the need to monitor carefully for signs of thyroid, liver, pulmonary, ophthalmological and skin side effects. Will obtain baseline PFTs and diffusion capacity. Will check liver and thyroid tests every 6 months. Advised her to have a yearly eye exam. Discussed the need for protection from sun with clothing/hats/sun block   Sanda Klein, MD, [facility] and [place] [phone] 10/18/2013, 6:00 PM

## 2013-10-18 NOTE — Patient Instructions (Signed)
Your physician recommends that you return for lab work .  Your physician has recommended that you have a pulmonary function test. Pulmonary Function Tests are a group of tests that measure how well air moves in and out of your lungs.  Your physician recommends that you schedule a follow-up appointment in: 12 MONTHS.

## 2013-10-19 LAB — COMPREHENSIVE METABOLIC PANEL
ALK PHOS: 64 U/L (ref 39–117)
ALT: 16 U/L (ref 0–35)
AST: 26 U/L (ref 0–37)
Albumin: 3.9 g/dL (ref 3.5–5.2)
BILIRUBIN TOTAL: 0.3 mg/dL (ref 0.3–1.2)
BUN: 11 mg/dL (ref 6–23)
CO2: 29 mEq/L (ref 19–32)
Calcium: 9.2 mg/dL (ref 8.4–10.5)
Chloride: 106 mEq/L (ref 96–112)
Creat: 1.39 mg/dL — ABNORMAL HIGH (ref 0.50–1.10)
GLUCOSE: 84 mg/dL (ref 70–99)
Potassium: 4.6 mEq/L (ref 3.5–5.3)
SODIUM: 143 meq/L (ref 135–145)
TOTAL PROTEIN: 6.6 g/dL (ref 6.0–8.3)

## 2013-10-19 LAB — TSH: TSH: 3.439 u[IU]/mL (ref 0.350–4.500)

## 2013-10-19 LAB — T4, FREE: FREE T4: 1.64 ng/dL (ref 0.80–1.80)

## 2013-10-20 ENCOUNTER — Encounter: Payer: Self-pay | Admitting: Cardiovascular Disease

## 2013-10-20 ENCOUNTER — Telehealth: Payer: Self-pay | Admitting: Cardiovascular Disease

## 2013-10-20 NOTE — Telephone Encounter (Signed)
LEFT MSG ON TODAY AND 10/18/13 FOR PT TO CALL REGARDING TIME FOR MET TEST.

## 2013-10-25 DIAGNOSIS — R0989 Other specified symptoms and signs involving the circulatory and respiratory systems: Secondary | ICD-10-CM

## 2013-10-25 DIAGNOSIS — Z9229 Personal history of other drug therapy: Secondary | ICD-10-CM

## 2013-10-25 DIAGNOSIS — R0602 Shortness of breath: Secondary | ICD-10-CM

## 2013-10-25 DIAGNOSIS — R0609 Other forms of dyspnea: Secondary | ICD-10-CM

## 2013-11-09 DIAGNOSIS — H2510 Age-related nuclear cataract, unspecified eye: Secondary | ICD-10-CM | POA: Diagnosis not present

## 2013-11-09 DIAGNOSIS — D3130 Benign neoplasm of unspecified choroid: Secondary | ICD-10-CM | POA: Diagnosis not present

## 2013-11-09 DIAGNOSIS — H18019 Anterior corneal pigmentations, unspecified eye: Secondary | ICD-10-CM | POA: Diagnosis not present

## 2013-11-10 ENCOUNTER — Other Ambulatory Visit: Payer: Self-pay | Admitting: Pulmonary Disease

## 2013-11-17 ENCOUNTER — Ambulatory Visit (INDEPENDENT_AMBULATORY_CARE_PROVIDER_SITE_OTHER): Payer: Medicare Other | Admitting: Pharmacist Clinician (PhC)/ Clinical Pharmacy Specialist

## 2013-11-17 VITALS — BP 142/70 | HR 72

## 2013-11-17 DIAGNOSIS — Z7901 Long term (current) use of anticoagulants: Secondary | ICD-10-CM | POA: Diagnosis not present

## 2013-11-17 DIAGNOSIS — I4891 Unspecified atrial fibrillation: Secondary | ICD-10-CM

## 2013-11-17 LAB — POCT INR: INR: 1.9

## 2013-11-17 MED ORDER — ATENOLOL 50 MG PO TABS: 75.0000 mg | ORAL_TABLET | Freq: Every day | ORAL | Status: DC

## 2013-12-09 ENCOUNTER — Other Ambulatory Visit: Payer: Self-pay | Admitting: Pulmonary Disease

## 2013-12-26 ENCOUNTER — Other Ambulatory Visit: Payer: Self-pay | Admitting: Pulmonary Disease

## 2013-12-28 ENCOUNTER — Other Ambulatory Visit: Payer: Self-pay | Admitting: Pulmonary Disease

## 2013-12-29 ENCOUNTER — Ambulatory Visit (INDEPENDENT_AMBULATORY_CARE_PROVIDER_SITE_OTHER): Payer: Medicare Other | Admitting: Pharmacist Clinician (PhC)/ Clinical Pharmacy Specialist

## 2013-12-29 DIAGNOSIS — I4891 Unspecified atrial fibrillation: Secondary | ICD-10-CM

## 2013-12-29 DIAGNOSIS — Z7901 Long term (current) use of anticoagulants: Secondary | ICD-10-CM | POA: Diagnosis not present

## 2013-12-29 LAB — POCT INR: INR: 1.5

## 2013-12-31 DIAGNOSIS — N390 Urinary tract infection, site not specified: Secondary | ICD-10-CM | POA: Diagnosis not present

## 2013-12-31 DIAGNOSIS — N3946 Mixed incontinence: Secondary | ICD-10-CM | POA: Diagnosis not present

## 2014-01-19 ENCOUNTER — Ambulatory Visit (INDEPENDENT_AMBULATORY_CARE_PROVIDER_SITE_OTHER): Payer: Medicare Other | Admitting: Pharmacist Clinician (PhC)/ Clinical Pharmacy Specialist

## 2014-01-19 DIAGNOSIS — Z7901 Long term (current) use of anticoagulants: Secondary | ICD-10-CM

## 2014-01-19 DIAGNOSIS — I4891 Unspecified atrial fibrillation: Secondary | ICD-10-CM

## 2014-01-19 LAB — POCT INR: INR: 1.8

## 2014-01-27 ENCOUNTER — Telehealth: Payer: Self-pay | Admitting: Pulmonary Disease

## 2014-01-27 MED ORDER — TRAMADOL HCL 50 MG PO TABS: 50.0000 mg | ORAL_TABLET | Freq: Two times a day (BID) | ORAL | Status: DC

## 2014-01-27 NOTE — Telephone Encounter (Signed)
Spoke with pt and is aware will send in refill. Nothing further needed

## 2014-01-27 NOTE — Telephone Encounter (Signed)
Patient calling wanting tramadol [facility]ill[facility]--fax#[phone]

## 2014-01-27 NOTE — Telephone Encounter (Signed)
Per SN---  Ok for the refill of the tramadol.  thanks

## 2014-01-27 NOTE — Telephone Encounter (Signed)
Last OV 08/11/13 Pending OV with Dr. Charlett Blake 03/31/14 Last fill was 07/20/13  SN - please advise on refill. Thanks.

## 2014-02-02 ENCOUNTER — Ambulatory Visit (INDEPENDENT_AMBULATORY_CARE_PROVIDER_SITE_OTHER): Payer: Medicare Other | Admitting: Pharmacist Clinician (PhC)/ Clinical Pharmacy Specialist

## 2014-02-02 DIAGNOSIS — Z7901 Long term (current) use of anticoagulants: Secondary | ICD-10-CM | POA: Diagnosis not present

## 2014-02-02 DIAGNOSIS — I4891 Unspecified atrial fibrillation: Secondary | ICD-10-CM

## 2014-02-02 LAB — POCT INR: INR: 2.2

## 2014-02-04 ENCOUNTER — Other Ambulatory Visit: Payer: Self-pay | Admitting: Cardiovascular Disease

## 2014-02-04 MED ORDER — WARFARIN SODIUM 2 MG PO TABS: ORAL_TABLET | ORAL | Status: DC

## 2014-02-04 MED ORDER — VALSARTAN-HYDROCHLOROTHIAZIDE 80-12.5 MG PO TABS: 1.0000 | ORAL_TABLET | Freq: Every day | ORAL | Status: DC

## 2014-02-04 NOTE — Telephone Encounter (Signed)
Valsartan-hctz sent to pharmacy.  Deferred refill for warfarin to pharmacist.  Message forwarded to Tommy Medal, PharmD.

## 2014-02-04 NOTE — Telephone Encounter (Signed)
Pt said [facility] told her to call and check on her Valsartan 80-12.5 mg and Warfarin 2mg  prescriptions. Please take care of this today if possible.

## 2014-02-09 ENCOUNTER — Ambulatory Visit: Payer: Medicare Other | Admitting: Pulmonary Disease

## 2014-02-20 ENCOUNTER — Other Ambulatory Visit: Payer: Self-pay | Admitting: Pulmonary Disease

## 2014-03-02 ENCOUNTER — Ambulatory Visit (INDEPENDENT_AMBULATORY_CARE_PROVIDER_SITE_OTHER): Payer: Medicare Other | Admitting: Pharmacist Clinician (PhC)/ Clinical Pharmacy Specialist

## 2014-03-02 DIAGNOSIS — I4891 Unspecified atrial fibrillation: Secondary | ICD-10-CM | POA: Diagnosis not present

## 2014-03-02 DIAGNOSIS — Z7901 Long term (current) use of anticoagulants: Secondary | ICD-10-CM | POA: Diagnosis not present

## 2014-03-02 LAB — POCT INR: INR: 2.5

## 2014-03-08 ENCOUNTER — Telehealth: Payer: Self-pay | Admitting: Pulmonary Disease

## 2014-03-08 NOTE — Telephone Encounter (Signed)
Forms have been completed by SN.  i have called and made the pt aware.   i will scan a copy into the pts chart and mail the copy to the [facility].

## 2014-03-08 NOTE — Telephone Encounter (Signed)
SN will fill out these papers for the pt.  These have been placed on SN cart.  Will call pt once this is done.

## 2014-03-10 ENCOUNTER — Other Ambulatory Visit: Payer: Self-pay | Admitting: Pulmonary Disease

## 2014-03-30 ENCOUNTER — Ambulatory Visit (INDEPENDENT_AMBULATORY_CARE_PROVIDER_SITE_OTHER): Payer: Medicare Other | Admitting: Pharmacist Clinician (PhC)/ Clinical Pharmacy Specialist

## 2014-03-30 ENCOUNTER — Telehealth: Payer: Self-pay | Admitting: Cardiovascular Disease

## 2014-03-30 DIAGNOSIS — I4891 Unspecified atrial fibrillation: Secondary | ICD-10-CM | POA: Diagnosis not present

## 2014-03-30 DIAGNOSIS — Z7901 Long term (current) use of anticoagulants: Secondary | ICD-10-CM

## 2014-03-30 LAB — POCT INR: INR: 3.3

## 2014-03-30 MED ORDER — AMIODARONE HCL 200 MG PO TABS: 200.0000 mg | ORAL_TABLET | Freq: Every day | ORAL | Status: DC

## 2014-03-30 NOTE — Telephone Encounter (Signed)
Returned call to patient. [facility] and [facility] day supply of pacerone to [facility].   Rx was sent to pharmacy electronically.

## 2014-03-30 NOTE — Telephone Encounter (Signed)
Please call,she needs to talk to you about her Pacerone that she gets from [facility].

## 2014-03-31 ENCOUNTER — Encounter: Payer: Self-pay | Admitting: Family Medicine

## 2014-03-31 ENCOUNTER — Ambulatory Visit (INDEPENDENT_AMBULATORY_CARE_PROVIDER_SITE_OTHER): Payer: Medicare Other | Admitting: Family Medicine

## 2014-03-31 ENCOUNTER — Telehealth: Payer: Self-pay | Admitting: Family Medicine

## 2014-03-31 VITALS — BP 138/82 | HR 89 | Temp 98.2°F | Ht 67.0 in | Wt 192.1 lb

## 2014-03-31 DIAGNOSIS — Z85828 Personal history of other malignant neoplasm of skin: Secondary | ICD-10-CM

## 2014-03-31 DIAGNOSIS — Z23 Encounter for immunization: Secondary | ICD-10-CM

## 2014-03-31 DIAGNOSIS — E7800 Pure hypercholesterolemia, unspecified: Secondary | ICD-10-CM

## 2014-03-31 DIAGNOSIS — N3281 Overactive bladder: Secondary | ICD-10-CM

## 2014-03-31 DIAGNOSIS — R609 Edema, unspecified: Secondary | ICD-10-CM | POA: Diagnosis not present

## 2014-03-31 DIAGNOSIS — E039 Hypothyroidism, unspecified: Secondary | ICD-10-CM | POA: Diagnosis not present

## 2014-03-31 DIAGNOSIS — I4891 Unspecified atrial fibrillation: Secondary | ICD-10-CM

## 2014-03-31 DIAGNOSIS — R600 Localized edema: Secondary | ICD-10-CM

## 2014-03-31 DIAGNOSIS — G6281 Critical illness polyneuropathy: Secondary | ICD-10-CM

## 2014-03-31 DIAGNOSIS — I482 Chronic atrial fibrillation, unspecified: Secondary | ICD-10-CM

## 2014-03-31 DIAGNOSIS — I10 Essential (primary) hypertension: Secondary | ICD-10-CM | POA: Diagnosis not present

## 2014-03-31 DIAGNOSIS — K589 Irritable bowel syndrome without diarrhea: Secondary | ICD-10-CM

## 2014-03-31 DIAGNOSIS — N318 Other neuromuscular dysfunction of bladder: Secondary | ICD-10-CM

## 2014-03-31 DIAGNOSIS — Z950 Presence of cardiac pacemaker: Secondary | ICD-10-CM

## 2014-03-31 DIAGNOSIS — Z8619 Personal history of other infectious and parasitic diseases: Secondary | ICD-10-CM | POA: Insufficient documentation

## 2014-03-31 LAB — CBC
HCT: 37.9 % (ref 36.0–46.0)
HEMOGLOBIN: 12.9 g/dL (ref 12.0–15.0)
MCH: 30.4 pg (ref 26.0–34.0)
MCHC: 34 g/dL (ref 30.0–36.0)
MCV: 89.4 fL (ref 78.0–100.0)
PLATELETS: 180 10*3/uL (ref 150–400)
RBC: 4.24 MIL/uL (ref 3.87–5.11)
RDW: 14 % (ref 11.5–15.5)
WBC: 7.3 10*3/uL (ref 4.0–10.5)

## 2014-03-31 MED ORDER — FUROSEMIDE 20 MG PO TABS: ORAL_TABLET | ORAL | Status: DC

## 2014-03-31 MED ORDER — ZOSTER VACCINE LIVE 19400 UNT/0.65ML ~~LOC~~ SOLR: 0.6500 mL | Freq: Once | SUBCUTANEOUS | Status: DC

## 2014-03-31 NOTE — Telephone Encounter (Signed)
Patient is requesting a shingles rx to be sent to [facility]

## 2014-03-31 NOTE — Patient Instructions (Addendum)

## 2014-03-31 NOTE — Telephone Encounter (Signed)
I sent the Zostavax to [facility] let her know

## 2014-03-31 NOTE — Progress Notes (Signed)
Pre visit review using our clinic review tool, if applicable. No additional management support is needed unless otherwise documented below in the visit note. 

## 2014-03-31 NOTE — Telephone Encounter (Signed)
Please advise 

## 2014-04-01 ENCOUNTER — Telehealth: Payer: Self-pay | Admitting: Family Medicine

## 2014-04-01 LAB — RENAL FUNCTION PANEL
ALBUMIN: 3.9 g/dL (ref 3.5–5.2)
BUN: 22 mg/dL (ref 6–23)
CHLORIDE: 107 meq/L (ref 96–112)
CO2: 27 meq/L (ref 19–32)
Calcium: 9.7 mg/dL (ref 8.4–10.5)
Creat: 1.74 mg/dL — ABNORMAL HIGH (ref 0.50–1.10)
GLUCOSE: 98 mg/dL (ref 70–99)
Phosphorus: 3.9 mg/dL (ref 2.3–4.6)
Potassium: 4.7 mEq/L (ref 3.5–5.3)
Sodium: 144 mEq/L (ref 135–145)

## 2014-04-01 LAB — HEPATIC FUNCTION PANEL
ALBUMIN: 3.9 g/dL (ref 3.5–5.2)
ALT: 15 U/L (ref 0–35)
AST: 23 U/L (ref 0–37)
Alkaline Phosphatase: 67 U/L (ref 39–117)
BILIRUBIN TOTAL: 0.5 mg/dL (ref 0.2–1.2)
Bilirubin, Direct: 0.1 mg/dL (ref 0.0–0.3)
Indirect Bilirubin: 0.4 mg/dL (ref 0.2–1.2)
Total Protein: 7.1 g/dL (ref 6.0–8.3)

## 2014-04-01 LAB — LIPID PANEL
CHOL/HDL RATIO: 3.4 ratio
Cholesterol: 162 mg/dL (ref 0–200)
HDL: 47 mg/dL (ref >39)
LDL Cholesterol: 78 mg/dL (ref 0–99)
Triglycerides: 185 mg/dL — ABNORMAL HIGH (ref <150)
VLDL: 37 mg/dL (ref 0–40)

## 2014-04-01 LAB — TSH: TSH: 4.082 u[IU]/mL (ref 0.350–4.500)

## 2014-04-01 NOTE — Telephone Encounter (Signed)
Relevant patient education mailed to patient.  

## 2014-04-03 ENCOUNTER — Other Ambulatory Visit: Payer: Self-pay | Admitting: Pulmonary Disease

## 2014-04-04 NOTE — Telephone Encounter (Signed)
LMOM with contact name and number to inform patient/SLS

## 2014-04-05 ENCOUNTER — Other Ambulatory Visit: Payer: Self-pay | Admitting: Family Medicine

## 2014-04-05 DIAGNOSIS — R600 Localized edema: Secondary | ICD-10-CM

## 2014-04-05 MED ORDER — FUROSEMIDE 20 MG PO TABS: ORAL_TABLET | ORAL | Status: DC

## 2014-04-05 NOTE — Telephone Encounter (Signed)
Please advise refill? 

## 2014-04-05 NOTE — Telephone Encounter (Signed)
Patient lost her lasix medication, could we send in another refill to [facility]

## 2014-04-10 ENCOUNTER — Encounter: Payer: Self-pay | Admitting: Family Medicine

## 2014-04-10 DIAGNOSIS — R600 Localized edema: Secondary | ICD-10-CM | POA: Insufficient documentation

## 2014-04-10 NOTE — Assessment & Plan Note (Signed)
Sees Dr Orene Desanctis, has appt in 7/15

## 2014-04-10 NOTE — Assessment & Plan Note (Signed)
Rate controlled, tolerating Warfarin, continue the same

## 2014-04-10 NOTE — Assessment & Plan Note (Signed)
Well controlled, no changes to meds. Encouraged heart healthy diet such as the DASH diet and exercise as tolerated.  °

## 2014-04-10 NOTE — Assessment & Plan Note (Signed)
Tolerating statin, encouraged heart healthy diet, avoid trans fats, minimize simple carbs and saturated fats. Increase exercise as tolerated 

## 2014-04-10 NOTE — Assessment & Plan Note (Signed)
On Levothyroxine, continue to monitor 

## 2014-04-10 NOTE — Assessment & Plan Note (Signed)
Avoid offending foods, start probiotics. Do not eat large meals in late evening and consider raising head of bed.   Sees Dr Carlean Purl

## 2014-04-10 NOTE — Assessment & Plan Note (Signed)
Minimize sodium, elevate feet above heart for 15 minutes bid, try compression hose.

## 2014-04-10 NOTE — Assessment & Plan Note (Signed)
resolved 

## 2014-04-10 NOTE — Progress Notes (Signed)
Patient ID: Jill Washington, female   DOB: March 23, 1926, 80 y.o.   MRN: [id number] Jill Washington [id number] 06-04-1926 04/10/2014      Progress Note New Patient  Subjective  Chief Complaint  Chief Complaint  Patient presents with  . Establish Care    new patient    HPI  Patient is a 80 year old female in today for routine medical care. Patient is in today to establish care. She has a concave past medical history which includes a collapsed lung and a long ICU stay in 1982. She has significant heart disease history as well as hypertension high cholesterol. She's been having more trouble with pedal edema in the last week or so. No shortness of breath. She has a long history of angina but it is stable and not worsening. She uses Imodium when necessary for some loose stool. Denies CP/palp/SOB/HA/congestion/fevers/GI or GU c/o. Taking meds as prescribed  Past Medical History  Diagnosis Date  . History of empyema of pleura   . Hypertension   . Mitral valve prolapse   . History of atrial fibrillation   . Sick sinus syndrome   . Venous insufficiency   . Hypercholesteremia   . Hypothyroidism   . Hiatal hernia   . IBS (irritable bowel syndrome)   . Colon polyps     hyperplastic  . Fibromyalgia   . Vitamin D deficiency   . Peripheral neuropathy   . Critical illness polyneuropathy   . History of skin cancer     face  . Diverticulosis of sigmoid colon 02/05/2007    mild  . GERD (gastroesophageal reflux disease)   . Pacemaker   . Arthritis   . History of gallstones   . Fecal incontinence   . Urinary incontinence   . Blood transfusion   . Chicken pox as a child  . Measles as a child  . Mumps as a child  . Shingles 8 yrs ago  . Overactive bladder 05/07/2011    Past Surgical History  Procedure Laterality Date  . Cervical spine surgery    . Cholecystectomy  03/2005    by Dr. Dalbert Batman  . Total abdominal hysterectomy w/ bilateral salpingoophorectomy    . Colonoscopy   02/05/2007,02/05/05    Dr. Silvano Rusk  . Esophagogastroduodenoscopy  02/05/2005,01/22/90    Dr. Silvano Rusk, Dr. Rachelle Hora  . Pacemaker implanted  05/11/97,08/02/05    [employer]  . Appendectomy  1971  . Tonsillectomy    . Cardiac catheterization  05/10/1997    Normal coronaries, MVP  . Nm myocar perf wall motion  08/24/2010    Normal    Family History  Problem Relation Age of Onset  . Pancreatic cancer Mother   . Diabetes Mother 35    type 2  . Stomach cancer Maternal Aunt   . Heart attack Brother   . Diabetes Brother     type 2  . Gallbladder disease Father   . Diabetes Father     type 2  . Diabetes Brother     X 2   . Cancer Sister 80    skin ca on face  . Diabetes Sister     type 2  . Colon cancer    . Diabetes Daughter     type 2  . Hypertension Daughter   . Alcohol abuse Daughter   . Diabetes Son     type 2  . Diabetes Sister     type 1  . Hypertension Sister  History   Social History  . Marital Status: Widowed    Spouse Name: N/A    Number of Children: 3  . Years of Education: N/A   Occupational History  . retired    Social History Main Topics  . Smoking status: Former Smoker -- 2.00 packs/day for 6 years    Types: Cigarettes    Quit date: 10/15/1951  . Smokeless tobacco: Never Used  . Alcohol Use: No  . Drug Use: No  . Sexual Activity: Not on file   Other Topics Concern  . Not on file   Social History Narrative   She is a widow, one son to daughters. She is retired.    Current Outpatient Prescriptions on File Prior to Visit  Medication Sig Dispense Refill  . amiodarone (PACERONE) 200 MG tablet Take 1 tablet (200 mg total) by mouth daily.  90 tablet  2  . atenolol (TENORMIN) 50 MG tablet Take 1.5 tablets (75 mg total) by mouth daily. 1 1/2  daily  135 tablet  2  . Cholecalciferol (VITAMIN D PO) Take 1 tablet by mouth daily.      Marland Kitchen esomeprazole (NEXIUM) 40 MG capsule Take 40 mg by mouth daily before breakfast.      . gabapentin  (NEURONTIN) 300 MG capsule Take 300 mg by mouth 2 (two) times daily.      Marland Kitchen gabapentin (NEURONTIN) 300 MG capsule TAKE 1 CAPSULE TWICE A DAY  180 capsule  0  . levothyroxine (SYNTHROID, LEVOTHROID) 125 MCG tablet Take 125 mcg by mouth daily before breakfast.      . levothyroxine (SYNTHROID, LEVOTHROID) 125 MCG tablet TAKE 1 TABLET DAILY  90 tablet  1  . loperamide (IMODIUM) 2 MG capsule Take 2 mg by mouth 4 (four) times daily as needed for diarrhea or loose stools.       . mirabegron ER (MYRBETRIQ) 50 MG TB24 Take 50 mg by mouth daily.      . Multiple Vitamins-Minerals (ONE-A-DAY WOMENS 50 PLUS PO) Take 1 tablet by mouth daily.      Marland Kitchen NEXIUM 40 MG capsule TAKE 1 CAPSULE DAILY BEFORE BREAKFAST  90 capsule  0  . pravastatin (PRAVACHOL) 40 MG tablet Take 40 mg by mouth at bedtime.      . pravastatin (PRAVACHOL) 40 MG tablet TAKE 1 TABLET DAILY  90 tablet  0  . traMADol (ULTRAM) 50 MG tablet Take 1 tablet (50 mg total) by mouth 2 (two) times daily.  180 tablet  0  . valsartan-hydrochlorothiazide (DIOVAN-HCT) 80-12.5 MG per tablet Take 1 tablet by mouth daily.  90 tablet  2  . vitamin B-12 (CYANOCOBALAMIN) 500 MCG tablet Take 500 mcg by mouth daily.      Marland Kitchen warfarin (COUMADIN) 2 MG tablet Take 1 tablet by mouth daily or as directed  90 tablet  1   No current facility-administered medications on file prior to visit.    Allergies  Allergen Reactions  . Cymbalta [Duloxetine Hcl]     Could not think straight  . Iodinated Diagnostic Agents     unknown  . Other Itching    Dial Soap    Review of Systems  Review of Systems  Constitutional: Negative for fever, chills and malaise/fatigue.  HENT: Negative for congestion, hearing loss and nosebleeds.   Eyes: Negative for discharge.  Respiratory: Negative for cough, sputum production, shortness of breath and wheezing.   Cardiovascular: Positive for chest pain and leg swelling. Negative for palpitations.  Gastrointestinal: Negative for  heartburn,  nausea, vomiting, abdominal pain, diarrhea, constipation and blood in stool.  Genitourinary: Negative for dysuria, urgency, frequency and hematuria.  Musculoskeletal: Negative for back pain, falls and myalgias.  Skin: Negative for rash.  Neurological: Negative for dizziness, tremors, sensory change, focal weakness, loss of consciousness, weakness and headaches.  Endo/Heme/Allergies: Negative for polydipsia. Does not bruise/bleed easily.  Psychiatric/Behavioral: Negative for depression and suicidal ideas. The patient is not nervous/anxious and does not have insomnia.     Objective  BP 138/82  Pulse 89  Temp(Src) 98.2 F (36.8 C) (Oral)  Ht 5\' 7"  (1.702 m)  Wt 192 lb 1.3 oz (87.127 kg)  BMI 30.08 kg/m2  SpO2 93%  Physical Exam  Physical Exam  Constitutional: She is oriented to person, place, and time and well-developed, well-nourished, and in no distress. No distress.  HENT:  Head: Normocephalic and atraumatic.  Right Ear: External ear normal.  Left Ear: External ear normal.  Nose: Nose normal.  Mouth/Throat: Oropharynx is clear and moist. No oropharyngeal exudate.  Eyes: Conjunctivae are normal. Pupils are equal, round, and reactive to light. Right eye exhibits no discharge. Left eye exhibits no discharge. No scleral icterus.  Neck: Normal range of motion. Neck supple. No thyromegaly present.  Cardiovascular: Normal rate, regular rhythm, normal heart sounds and intact distal pulses.   No murmur heard. Pulmonary/Chest: Effort normal and breath sounds normal. No respiratory distress. She has no wheezes. She has no rales.  Abdominal: Soft. Bowel sounds are normal. She exhibits no distension and no mass. There is no tenderness.  Musculoskeletal: Normal range of motion. She exhibits no edema and no tenderness.  Lymphadenopathy:    She has no cervical adenopathy.  Neurological: She is alert and oriented to person, place, and time. She has normal reflexes. No cranial nerve deficit.  Coordination normal.  Skin: Skin is warm and dry. No rash noted. She is not diaphoretic.  Psychiatric: Mood, memory and affect normal.       Assessment & Plan  HYPOTHYROIDISM On Levothyroxine, continue to monitor  HYPERCHOLESTEROLEMIA Tolerating statin, encouraged heart healthy diet, avoid trans fats, minimize simple carbs and saturated fats. Increase exercise as tolerated  HYPERTENSION Well controlled, no changes to meds. Encouraged heart healthy diet such as the DASH diet and exercise as tolerated.   Critical illness polyneuropathy Has struggled with neuropathy since becoming seriously ill in 1982 after an abscess and collapsed lung  Atrial fibrillation Rate controlled, tolerating Warfarin, continue the same  Irritable bowel syndrome Avoid offending foods, start probiotics. Do not eat large meals in late evening and consider raising head of bed.   Sees Dr Carlean Purl  Pacemaker -  [employer] leads implanted in 1998 generator change 2006 and 2013 Sees Dr Orene Desanctis, has appt in 7/15  Anemia resolved  Pedal edema Minimize sodium, elevate feet above heart for 15 minutes bid, try compression hose.

## 2014-04-10 NOTE — Assessment & Plan Note (Signed)
Has struggled with neuropathy since becoming seriously ill in 1982 after an abscess and collapsed lung

## 2014-04-27 ENCOUNTER — Ambulatory Visit (INDEPENDENT_AMBULATORY_CARE_PROVIDER_SITE_OTHER): Payer: Medicare Other | Admitting: Pharmacist

## 2014-04-27 DIAGNOSIS — I4891 Unspecified atrial fibrillation: Secondary | ICD-10-CM | POA: Diagnosis not present

## 2014-04-27 DIAGNOSIS — Z7901 Long term (current) use of anticoagulants: Secondary | ICD-10-CM | POA: Diagnosis not present

## 2014-04-27 LAB — POCT INR: INR: 2.5

## 2014-05-05 ENCOUNTER — Other Ambulatory Visit: Payer: Self-pay | Admitting: Pulmonary Disease

## 2014-05-11 ENCOUNTER — Telehealth: Payer: Self-pay | Admitting: *Deleted

## 2014-05-11 NOTE — Telephone Encounter (Signed)
Half of 100 mg daily is fine

## 2014-05-11 NOTE — Telephone Encounter (Signed)
Received message from pt that [facility] told her to contact us for refills on Atenolol 50mg  and she isn't sure why. Spoke with Belenda Cruise at [facility] and was told that medication is on manufacturer backorder and could be on backorder from 30 days until 6 months. She states 50mg  is only strength currently on backorder. States we could send different strength combination or see if pt can get from local pharmacy? Left message on pt's voicemail that I am sending request to her cardiologist as they have been prescribing medication in the past.  Please advise.

## 2014-05-13 MED ORDER — ATENOLOL 50 MG PO TABS: 75.0000 mg | ORAL_TABLET | Freq: Every day | ORAL | Status: DC

## 2014-05-13 NOTE — Telephone Encounter (Signed)
Patient notified of advice per Dr. Loletha Grayer but asked that short supply be sent to [facility] until [facility] can get the 50mg  tablets in. Told her Dr. Loletha Grayer said she could take only 50mg  (1/2 of 100mg  tab) but she prefers to take 75mg 

## 2014-05-18 ENCOUNTER — Telehealth: Payer: Self-pay | Admitting: Family Medicine

## 2014-05-18 MED ORDER — TRAMADOL HCL 50 MG PO TABS: 50.0000 mg | ORAL_TABLET | Freq: Two times a day (BID) | ORAL | Status: DC

## 2014-05-18 NOTE — Telephone Encounter (Signed)
Refill-tramadol  [facility]

## 2014-05-21 ENCOUNTER — Other Ambulatory Visit: Payer: Self-pay | Admitting: Pulmonary Disease

## 2014-05-24 ENCOUNTER — Encounter: Payer: Self-pay | Admitting: Internal Medicine

## 2014-05-25 ENCOUNTER — Ambulatory Visit (INDEPENDENT_AMBULATORY_CARE_PROVIDER_SITE_OTHER): Payer: Medicare Other | Admitting: Pharmacist Clinician (PhC)/ Clinical Pharmacy Specialist

## 2014-05-25 DIAGNOSIS — Z7901 Long term (current) use of anticoagulants: Secondary | ICD-10-CM | POA: Diagnosis not present

## 2014-05-25 DIAGNOSIS — I4891 Unspecified atrial fibrillation: Secondary | ICD-10-CM | POA: Diagnosis not present

## 2014-05-25 LAB — POCT INR: INR: 2.7

## 2014-06-22 ENCOUNTER — Ambulatory Visit (INDEPENDENT_AMBULATORY_CARE_PROVIDER_SITE_OTHER): Payer: Medicare Other | Admitting: Pharmacist Clinician (PhC)/ Clinical Pharmacy Specialist

## 2014-06-22 DIAGNOSIS — Z7901 Long term (current) use of anticoagulants: Secondary | ICD-10-CM

## 2014-06-22 DIAGNOSIS — I4891 Unspecified atrial fibrillation: Secondary | ICD-10-CM | POA: Diagnosis not present

## 2014-06-22 LAB — POCT INR: INR: 4.1

## 2014-06-27 ENCOUNTER — Other Ambulatory Visit: Payer: Self-pay | Admitting: Pharmacist Clinician (PhC)/ Clinical Pharmacy Specialist

## 2014-07-01 ENCOUNTER — Ambulatory Visit: Payer: Medicare Other | Admitting: Pharmacist Clinician (PhC)/ Clinical Pharmacy Specialist

## 2014-07-05 DIAGNOSIS — Y9289 Other specified places as the place of occurrence of the external cause: Secondary | ICD-10-CM | POA: Diagnosis not present

## 2014-07-05 DIAGNOSIS — W208XXA Other cause of strike by thrown, projected or falling object, initial encounter: Secondary | ICD-10-CM | POA: Diagnosis not present

## 2014-07-05 DIAGNOSIS — S81009A Unspecified open wound, unspecified knee, initial encounter: Secondary | ICD-10-CM | POA: Diagnosis not present

## 2014-07-05 DIAGNOSIS — S8010XA Contusion of unspecified lower leg, initial encounter: Secondary | ICD-10-CM | POA: Diagnosis not present

## 2014-08-17 ENCOUNTER — Telehealth: Payer: Self-pay | Admitting: Family Medicine

## 2014-08-17 NOTE — Telephone Encounter (Signed)
Caller name:Radice Pressley Relation to PO:[id number] Call back [phone] [facility]  Reason for call: pt is needing a new  rx gabapentin (NEURONTIN) 300 MG, pt states she has 1 week left .

## 2014-08-18 MED ORDER — GABAPENTIN 300 MG PO CAPS: 300.0000 mg | ORAL_CAPSULE | Freq: Two times a day (BID) | ORAL | Status: DC

## 2014-08-30 ENCOUNTER — Ambulatory Visit: Payer: Medicare Other | Admitting: Family Medicine

## 2014-09-14 ENCOUNTER — Ambulatory Visit (INDEPENDENT_AMBULATORY_CARE_PROVIDER_SITE_OTHER): Payer: Medicare Other | Admitting: Pharmacist Clinician (PhC)/ Clinical Pharmacy Specialist

## 2014-09-14 DIAGNOSIS — I482 Chronic atrial fibrillation, unspecified: Secondary | ICD-10-CM

## 2014-09-14 DIAGNOSIS — Z7901 Long term (current) use of anticoagulants: Secondary | ICD-10-CM | POA: Diagnosis not present

## 2014-09-14 DIAGNOSIS — I4891 Unspecified atrial fibrillation: Secondary | ICD-10-CM | POA: Diagnosis not present

## 2014-09-14 LAB — POCT INR: INR: 3.6

## 2014-09-16 ENCOUNTER — Other Ambulatory Visit: Payer: Self-pay

## 2014-09-16 MED ORDER — TRAMADOL HCL 50 MG PO TABS: 50.0000 mg | ORAL_TABLET | Freq: Two times a day (BID) | ORAL | Status: DC

## 2014-09-22 ENCOUNTER — Encounter (HOSPITAL_COMMUNITY): Payer: Self-pay | Admitting: Cardiovascular Disease

## 2014-09-27 ENCOUNTER — Other Ambulatory Visit: Payer: Self-pay | Admitting: Cardiovascular Disease

## 2014-09-28 ENCOUNTER — Ambulatory Visit (INDEPENDENT_AMBULATORY_CARE_PROVIDER_SITE_OTHER): Payer: Medicare Other | Admitting: Pharmacist Clinician (PhC)/ Clinical Pharmacy Specialist

## 2014-09-28 DIAGNOSIS — I4891 Unspecified atrial fibrillation: Secondary | ICD-10-CM

## 2014-09-28 DIAGNOSIS — Z7901 Long term (current) use of anticoagulants: Secondary | ICD-10-CM | POA: Diagnosis not present

## 2014-09-28 LAB — POCT INR: INR: 3.1

## 2014-10-24 DIAGNOSIS — H40003 Preglaucoma, unspecified, bilateral: Secondary | ICD-10-CM | POA: Diagnosis not present

## 2014-10-24 DIAGNOSIS — D3132 Benign neoplasm of left choroid: Secondary | ICD-10-CM | POA: Diagnosis not present

## 2014-10-24 DIAGNOSIS — H2513 Age-related nuclear cataract, bilateral: Secondary | ICD-10-CM | POA: Diagnosis not present

## 2014-10-26 ENCOUNTER — Ambulatory Visit (INDEPENDENT_AMBULATORY_CARE_PROVIDER_SITE_OTHER): Payer: Medicare Other | Admitting: Pharmacist Clinician (PhC)/ Clinical Pharmacy Specialist

## 2014-10-26 DIAGNOSIS — I4891 Unspecified atrial fibrillation: Secondary | ICD-10-CM

## 2014-10-26 DIAGNOSIS — Z7901 Long term (current) use of anticoagulants: Secondary | ICD-10-CM

## 2014-10-26 LAB — POCT INR: INR: 2.5

## 2014-11-10 ENCOUNTER — Ambulatory Visit (INDEPENDENT_AMBULATORY_CARE_PROVIDER_SITE_OTHER): Payer: Medicare Other | Admitting: *Deleted

## 2014-11-10 ENCOUNTER — Encounter: Payer: Self-pay | Admitting: Cardiovascular Disease

## 2014-11-10 ENCOUNTER — Ambulatory Visit (INDEPENDENT_AMBULATORY_CARE_PROVIDER_SITE_OTHER): Payer: Medicare Other | Admitting: Cardiovascular Disease

## 2014-11-10 VITALS — BP 136/78 | HR 70 | Ht 66.0 in | Wt 193.2 lb

## 2014-11-10 DIAGNOSIS — I480 Paroxysmal atrial fibrillation: Secondary | ICD-10-CM

## 2014-11-10 DIAGNOSIS — R5383 Other fatigue: Secondary | ICD-10-CM | POA: Diagnosis not present

## 2014-11-10 DIAGNOSIS — I442 Atrioventricular block, complete: Secondary | ICD-10-CM

## 2014-11-10 DIAGNOSIS — Z950 Presence of cardiac pacemaker: Secondary | ICD-10-CM | POA: Diagnosis not present

## 2014-11-10 DIAGNOSIS — I4891 Unspecified atrial fibrillation: Secondary | ICD-10-CM

## 2014-11-10 DIAGNOSIS — Z79899 Other long term (current) drug therapy: Secondary | ICD-10-CM

## 2014-11-10 DIAGNOSIS — E7800 Pure hypercholesterolemia, unspecified: Secondary | ICD-10-CM

## 2014-11-10 DIAGNOSIS — I495 Sick sinus syndrome: Secondary | ICD-10-CM | POA: Diagnosis not present

## 2014-11-10 DIAGNOSIS — Z7901 Long term (current) use of anticoagulants: Secondary | ICD-10-CM

## 2014-11-10 HISTORY — DX: Atrioventricular block, complete: I44.2

## 2014-11-10 NOTE — Progress Notes (Signed)
Patient ID: Jill Washington Washington, female   DOB: [birth year], 81 y.o.   MRN: [id number]     Reason for office visit Complete heart block, sinus node dysfunction, paroxysmal atrial fibrillation, pacemaker check  Jill Washington Washington has no cardiovascular complaints other than occasional atypical chest discomfort. A nuclear stress test performed for this last November 2014 showed normal findings.  She is pacemaker dependent due to complete heart block and also has severe sinus node dysfunction, often without any detectable atrial activity. She has recurring paroxysmal atrial fibrillation, as far as I can tell, the arrhythmia is not particularly symptomatic. She has complete heart block and is 100% ventricular paced so rapid ventricular rates are never a problem. At one point she was treated with multaq with very poor results, with a burden of atrial fibrillation that approached 40-50%. After switching to amiodarone the arrhythmia has been controlled much better. At this point, her pacemaker check shows a burden of atrial fibrillation of only 2% and falling. She has not had any clear side effects from amiodarone.  She has chronic stasis dermatitis and mild lower showed edema related to peripheral venous insufficiency From my interaction today it is apparent that her memory is becoming a bit of a problem. She is on chronic warfarin anticoagulation and has not had stroke/TIA or any serious bleeding problems   Allergies  Allergen Reactions  . Cymbalta [Duloxetine Hcl]     Could not think straight  . Iodinated Diagnostic Agents     unknown  . Other Itching    Dial Soap    Current Outpatient Prescriptions  Medication Sig Dispense Refill  . amiodarone (PACERONE) 200 MG tablet Take 1 tablet (200 mg total) by mouth daily. 90 tablet 2  . atenolol (TENORMIN) 50 MG tablet Take 1.5 tablets (75 mg total) by mouth daily. 45 tablet 1  . Cholecalciferol (VITAMIN D PO) Take 1 tablet by mouth daily.    Marland Kitchen esomeprazole  (NEXIUM) 40 MG capsule Take 40 mg by mouth daily before breakfast.    . furosemide (LASIX) 20 MG tablet 2 tabs po daily x 3 days then 1 tab po daily then 1 tab po daily prn SOB/Edema/weight Gain>3# in 24 hours. 40 tablet 1  . gabapentin (NEURONTIN) 300 MG capsule Take 1 capsule (300 mg total) by mouth 2 (two) times daily. 180 capsule 1  . levothyroxine (SYNTHROID, LEVOTHROID) 125 MCG tablet Take 125 mcg by mouth daily before breakfast.    . loperamide (IMODIUM) 2 MG capsule Take 2 mg by mouth 4 (four) times daily as needed for diarrhea or loose stools.     . mirabegron ER (MYRBETRIQ) 50 MG TB24 Take 50 mg by mouth daily.    . Multiple Vitamins-Minerals (ONE-A-DAY WOMENS 50 PLUS PO) Take 1 tablet by mouth daily.    Marland Kitchen NEXIUM 40 MG capsule TAKE 1 CAPSULE DAILY BEFORE BREAKFAST 90 capsule 0  . pravastatin (PRAVACHOL) 40 MG tablet TAKE 1 TABLET DAILY 90 tablet 0  . traMADol (ULTRAM) 50 MG tablet Take 1 tablet (50 mg total) by mouth 2 (two) times daily. 180 tablet 0  . valsartan-hydrochlorothiazide (DIOVAN-HCT) 80-12.5 MG per tablet TAKE 1 TABLET DAILY 90 tablet 1  . vitamin B-12 (CYANOCOBALAMIN) 500 MCG tablet Take 500 mcg by mouth daily.    Marland Kitchen warfarin (COUMADIN) 2 MG tablet TAKE 1 TABLET DAILY OR AS DIRECTED (Patient taking differently: 1 tablet on monday and 1/2 tablet the rest of the week.) 90 tablet 1  . zoster vaccine live, PF, (ZOSTAVAX)  19400 UNT/0.65ML injection Inject 19,400 Units into the skin once. 1 each 0   No current facility-administered medications for this visit.    Past Medical History  Diagnosis Date  . History of empyema of pleura   . Hypertension   . Mitral valve prolapse   . History of atrial fibrillation   . Sick sinus syndrome   . Venous insufficiency   . Hypercholesteremia   . Hypothyroidism   . Hiatal hernia   . IBS (irritable bowel syndrome)   . Colon polyps     hyperplastic  . Fibromyalgia   . Vitamin D deficiency   . Peripheral neuropathy   . Critical  illness polyneuropathy   . History of skin cancer     face  . Diverticulosis of sigmoid colon 02/05/2007    mild  . GERD (gastroesophageal reflux disease)   . Pacemaker   . Arthritis   . History of gallstones   . Fecal incontinence   . Urinary incontinence   . Blood transfusion   . Chicken pox as a child  . Measles as a child  . Mumps as a child  . Shingles 8 yrs ago  . Overactive bladder 05/07/2011  . CHB (complete heart block) 11/10/2014    Past Surgical History  Procedure Laterality Date  . Cervical spine surgery    . Cholecystectomy  03/2005    by Dr. Dalbert Batman  . Total abdominal hysterectomy w/ bilateral salpingoophorectomy    . Colonoscopy  02/05/2007,02/05/05    Dr. Silvano Rusk  . Esophagogastroduodenoscopy  02/05/2005,01/22/90    Dr. Silvano Rusk, Dr. Rachelle Hora  . Pacemaker implanted  05/11/97,08/02/05    [employer]  . Appendectomy  1971  . Tonsillectomy    . Cardiac catheterization  05/10/1997    Normal coronaries, MVP  . Nm myocar perf wall motion  08/24/2010    Normal  . Pacemaker generator change N/A 02/25/2012    Procedure: PACEMAKER GENERATOR CHANGE;  Surgeon: Sanda Klein, MD;  Location: [facility];  Service: Cardiovascular;  Laterality: N/A;    Family History  Problem Relation Age of Onset  . Pancreatic cancer Mother   . Diabetes Mother 15    type 2  . Stomach cancer Maternal Aunt   . Heart attack Brother   . Diabetes Brother     type 2  . Gallbladder disease Father   . Diabetes Father     type 2  . Diabetes Brother     X 2   . Cancer Sister 80    skin ca on face  . Diabetes Sister     type 2  . Colon cancer    . Diabetes Daughter     type 2  . Hypertension Daughter   . Alcohol abuse Daughter   . Diabetes Son     type 2  . Diabetes Sister     type 1  . Hypertension Sister     History   Social History  . Marital Status: Widowed    Spouse Name: N/A    Number of Children: 3  . Years of Education: N/A   Occupational History  .  retired    Social History Main Topics  . Smoking status: Former Smoker -- 2.00 packs/day for 6 years    Types: Cigarettes    Quit date: 10/15/1951  . Smokeless tobacco: Never Used  . Alcohol Use: No  . Drug Use: No  . Sexual Activity: Not on file   Other Topics Concern  .  Not on file   Social History Narrative   She is a widow, one son to daughters. She is retired.    Review of systems: The patient specifically denies any chest pain at rest or with exertion, dyspnea at rest or with exertion, orthopnea, paroxysmal nocturnal dyspnea, syncope, palpitations, focal neurological deficits, intermittent claudication, lower extremity edema, unexplained weight gain, cough, hemoptysis or wheezing.  The patient also denies abdominal pain, nausea, vomiting, dysphagia, diarrhea, constipation, polyuria, polydipsia, dysuria, hematuria, frequency, urgency, abnormal bleeding or bruising, fever, chills, unexpected weight changes, mood swings, change in skin or hair texture, change in voice quality, auditory or visual problems, allergic reactions or rashes, new musculoskeletal complaints other than usual "aches and pains".   PHYSICAL EXAM BP 136/78 mmHg  Pulse 70  Ht 5\' 6"  (1.676 m)  Wt 193 lb 3.2 oz (87.635 kg)  BMI 31.20 kg/m2  General: Alert, oriented x3, no distress Head: no evidence of trauma, PERRL, EOMI, no exophtalmos or lid lag, no myxedema, no xanthelasma; normal ears, nose and oropharynx Neck: normal jugular venous pulsations and no hepatojugular reflux; brisk carotid pulses without delay and no carotid bruits Chest: clear to auscultation, no signs of consolidation by percussion or palpation, normal fremitus, symmetrical and full respiratory excursions Cardiovascular: normal position and quality of the apical impulse, regular rhythm, normal first and paradoxically split second heart sounds, no murmurs, rubs or gallops Abdomen: no tenderness or distention, no masses by palpation, no  abnormal pulsatility or arterial bruits, normal bowel sounds, no hepatosplenomegaly Extremities: Prominent varicose veins and chronic brawny edema bilaterally, no clubbing, cyanosis; 2+ radial, ulnar and brachial pulses bilaterally; 2+ right femoral, posterior tibial and dorsalis pedis pulses; 2+ left femoral, posterior tibial and dorsalis pedis pulses; no subclavian or femoral bruits Neurological: grossly nonfocal   EKG: AV paced  Lipid Panel     Component Value Date/Time   CHOL 162 03/31/2014 1056   TRIG 185* 03/31/2014 1056   HDL 47 03/31/2014 1056   CHOLHDL 3.4 03/31/2014 1056   VLDL 37 03/31/2014 1056   LDLCALC 78 03/31/2014 1056   LDLDIRECT 148.9 06/28/2008 0854    BMET    Component Value Date/Time   NA 144 03/31/2014 1056   K 4.7 03/31/2014 1056   CL 107 03/31/2014 1056   CO2 27 03/31/2014 1056   GLUCOSE 98 03/31/2014 1056   BUN 22 03/31/2014 1056   CREATININE 1.74* 03/31/2014 1056   CREATININE 1.37* 08/30/2013 1137   CALCIUM 9.7 03/31/2014 1056   GFRNONAA 34* 08/30/2013 1137   GFRNONAA 31* 07/02/2013 1522   GFRAA 40* 08/30/2013 1137   GFRAA 35* 07/02/2013 1522     ASSESSMENT AND PLAN Pacemaker - [employer] leads implanted in 1998 generator change 2006 and 2013 Recent comprehensive pacemaker check shows normal function. Her device was initially implanted in 1998. I performed her most recent generator change out in 2013. Most recently she had an atrial fibrillation burden of 1.9% and atrial pacing occurred the rest of the time. She is 100% ventricular paced secondary to complete heart block. No changes were made to pacing settings today. She is pacemaker dependent and her device needs to be checked every 3 months. I'm not sure how she slept through the remote monitoring system but will make sure that we have every 3 month checks without fail from now on.  Atrial fibrillation Overall burden of paroxysmal atrial fibrillation is about 2%. Treatment with amiodarone has  led to a marked reduction in the frequency of atrial fibrillation.  She needs liver function tests and thyroid function tests periodically. These were last evaluated June 2015 and were all within normal range. She is on appropriate anticoagulation with warfarin, and I worry about it to about her memory issues.Marland Kitchen  HYPERCHOLESTEROLEMIA Cholesterol levels are satisfactory but the triglycerides are borderline elevated when last checked. She is on pravastatin. Note normal coronary arteries by angiography in 1998. Target LDL cholesterol 100 mg/dL or less  HYPERTENSION Her blood pressure is good today.  Orders Placed This Encounter  Procedures  . Comprehensive metabolic panel  . Lipid panel  . TSH  . EKG 12-Lead   No orders of the defined types were placed in this encounter.    Holli Humbles, MD, [facility] [phone] office [phone] pager

## 2014-11-10 NOTE — Patient Instructions (Signed)
A transmitter has been ordered and will be mailed to your home.  Please plug it in at home in your bedroom and try to do a transmission.  IF YOU HAVE ANY PROBLEMS PLEASE CALL THE [address] OFFICE [phone] TO THE DEVICE CLINIC.  THEY WILL WORK YOU THROUGH THE PROCESS.  Your physician recommends that you return for lab work in: FASTING at [place] lab at [facility].  Dr. Sallyanne Kuster recommends that you schedule a follow-up appointment in: 3 months with pacemaker check - [employer].

## 2014-11-11 ENCOUNTER — Telehealth: Payer: Self-pay | Admitting: *Deleted

## 2014-11-11 DIAGNOSIS — Z79899 Other long term (current) drug therapy: Secondary | ICD-10-CM | POA: Diagnosis not present

## 2014-11-11 DIAGNOSIS — E780 Pure hypercholesterolemia: Secondary | ICD-10-CM | POA: Diagnosis not present

## 2014-11-11 DIAGNOSIS — R5383 Other fatigue: Secondary | ICD-10-CM | POA: Diagnosis not present

## 2014-11-11 LAB — LIPID PANEL
CHOL/HDL RATIO: 3.8 ratio
CHOLESTEROL: 151 mg/dL (ref 0–200)
HDL: 40 mg/dL (ref >39)
LDL Cholesterol: 70 mg/dL (ref 0–99)
Triglycerides: 206 mg/dL — ABNORMAL HIGH (ref <150)
VLDL: 41 mg/dL — AB (ref 0–40)

## 2014-11-11 LAB — COMPREHENSIVE METABOLIC PANEL
ALT: 12 U/L (ref 0–35)
AST: 19 U/L (ref 0–37)
Albumin: 3.7 g/dL (ref 3.5–5.2)
Alkaline Phosphatase: 69 U/L (ref 39–117)
BUN: 14 mg/dL (ref 6–23)
CHLORIDE: 105 meq/L (ref 96–112)
CO2: 26 mEq/L (ref 19–32)
Calcium: 9.1 mg/dL (ref 8.4–10.5)
Creat: 1.31 mg/dL — ABNORMAL HIGH (ref 0.50–1.10)
Glucose, Bld: 82 mg/dL (ref 70–99)
Potassium: 4.7 mEq/L (ref 3.5–5.3)
SODIUM: 142 meq/L (ref 135–145)
TOTAL PROTEIN: 6.4 g/dL (ref 6.0–8.3)
Total Bilirubin: 0.5 mg/dL (ref 0.2–1.2)

## 2014-11-11 LAB — TSH: TSH: 5.331 u[IU]/mL — AB (ref 0.350–4.500)

## 2014-11-11 NOTE — Telephone Encounter (Signed)
Verbal instructions given on how to set up Merlin monitor. I encouraged patient to call the office one the equipment is received if she has questions. Patient voiced understanding.

## 2014-11-13 LAB — POCT INR: INR: 2.5

## 2014-11-14 ENCOUNTER — Telehealth: Payer: Self-pay | Admitting: *Deleted

## 2014-11-14 DIAGNOSIS — E038 Other specified hypothyroidism: Secondary | ICD-10-CM

## 2014-11-14 DIAGNOSIS — Z79899 Other long term (current) drug therapy: Secondary | ICD-10-CM

## 2014-11-14 NOTE — Telephone Encounter (Signed)
-----   Message from Sanda Klein, MD sent at 11/11/2014  5:19 PM EST ----- Cholesterol parameters are acceptable. Borderline abnormal thyroid test. Normal LFTs. Please repeat CMET and TSH and do free T4 in 6 months.

## 2014-11-14 NOTE — Telephone Encounter (Signed)
Lab results called to patient and instructed to get labs rechecked in 6 months.  Patient voiced under standing.  Order placed and mailed to patient.

## 2014-11-16 LAB — MDC_IDC_ENUM_SESS_TYPE_INCLINIC
Battery Remaining Percentage: 81 %
Brady Statistic RV Percent Paced: 99 %
Implantable Pulse Generator Model: 2210
Lead Channel Impedance Value: 510 Ohm
Lead Channel Pacing Threshold Amplitude: 0.625 V
Lead Channel Setting Pacing Amplitude: 1.375
Lead Channel Setting Pacing Amplitude: 1.625
Lead Channel Setting Pacing Pulse Width: 0.4 ms
MDC IDC MSMT BATTERY VOLTAGE: 2.93 V
MDC IDC MSMT LEADCHNL RA IMPEDANCE VALUE: 360 Ohm
MDC IDC MSMT LEADCHNL RA PACING THRESHOLD PULSEWIDTH: 0.4 ms
MDC IDC MSMT LEADCHNL RA SENSING INTR AMPL: 2.7 mV
MDC IDC MSMT LEADCHNL RV PACING THRESHOLD AMPLITUDE: 1.125 V
MDC IDC MSMT LEADCHNL RV PACING THRESHOLD PULSEWIDTH: 0.4 ms
MDC IDC PG SERIAL: 7338831
MDC IDC SET LEADCHNL RV SENSING SENSITIVITY: 4 mV
MDC IDC STAT BRADY RA PERCENT PACED: 96 %

## 2014-11-16 NOTE — Progress Notes (Signed)
Pacemaker check in clinic (Industry checked). Normal device function. Thresholds, sensing, impedances consistent with previous measurements. Device programmed to maximize longevity. 399 mode switches (11%)---max dur. 2 days, Max A 640, Max V 116 + Amio/Warfarin. No high ventricular rates noted. Device programmed at appropriate safety margins. Histogram distribution appropriate for patient activity level. Device programmed to optimize intrinsic conduction. Estimated longevity 7.3-8.1 years. Patient will follow up with [facility] in 3 months.

## 2014-11-17 ENCOUNTER — Telehealth: Payer: Self-pay | Admitting: Pharmacist Clinician (PhC)/ Clinical Pharmacy Specialist

## 2014-11-18 ENCOUNTER — Other Ambulatory Visit: Payer: Self-pay | Admitting: Cardiovascular Disease

## 2014-11-18 NOTE — Telephone Encounter (Signed)
Rx(s) sent to pharmacy electronically.  

## 2014-11-19 ENCOUNTER — Other Ambulatory Visit: Payer: Self-pay | Admitting: Cardiovascular Disease

## 2014-11-22 DIAGNOSIS — H18412 Arcus senilis, left eye: Secondary | ICD-10-CM | POA: Diagnosis not present

## 2014-11-22 DIAGNOSIS — H2512 Age-related nuclear cataract, left eye: Secondary | ICD-10-CM | POA: Diagnosis not present

## 2014-11-22 DIAGNOSIS — H02839 Dermatochalasis of unspecified eye, unspecified eyelid: Secondary | ICD-10-CM | POA: Diagnosis not present

## 2014-11-22 DIAGNOSIS — H2511 Age-related nuclear cataract, right eye: Secondary | ICD-10-CM | POA: Diagnosis not present

## 2014-11-22 DIAGNOSIS — H18411 Arcus senilis, right eye: Secondary | ICD-10-CM | POA: Diagnosis not present

## 2014-11-23 ENCOUNTER — Encounter: Payer: Self-pay | Admitting: Cardiovascular Disease

## 2014-11-23 NOTE — Telephone Encounter (Signed)
Closed encounter °

## 2014-11-28 ENCOUNTER — Telehealth: Payer: Self-pay | Admitting: *Deleted

## 2014-11-28 NOTE — Telephone Encounter (Signed)
Signed surgical clearance for bilateral cataract extraction faxed.

## 2014-11-30 ENCOUNTER — Telehealth: Payer: Self-pay | Admitting: *Deleted

## 2014-11-30 ENCOUNTER — Telehealth: Payer: Self-pay | Admitting: Family Medicine

## 2014-11-30 DIAGNOSIS — I10 Essential (primary) hypertension: Secondary | ICD-10-CM

## 2014-11-30 DIAGNOSIS — R946 Abnormal results of thyroid function studies: Secondary | ICD-10-CM

## 2014-11-30 NOTE — Telephone Encounter (Signed)
Caller name: Tineka, Perrell Relation to pt: self  Call back number: [phone] Pharmacy: [facility], [place] [phone] (Phone) [phone] (Fax)         Reason for call:  Pt requesting a refill levothyroxine (SYNTHROID, LEVOTHROID) 125 MCG tablet

## 2014-11-30 NOTE — Telephone Encounter (Signed)
-----   Message from Mosie Lukes, MD sent at 11/11/2014  5:32 PM EST ----- Also needs a FreeT4 for abnormal thyroid studies prior to May visit ----- Message -----    From: Sanda Klein, MD    Sent: 11/11/2014   5:19 PM      To: Mosie Lukes, MD, Tressa Busman, CMA  Cholesterol parameters are acceptable. Borderline abnormal thyroid test. Normal LFTs. Please repeat CMET and TSH and do free T4 in 6 months.

## 2014-11-30 NOTE — Telephone Encounter (Signed)
Spoke with the pt and she was aware of recent lab results and note.  Lab appt scheduled and future labs ordered and sent.//AB/CMA

## 2014-11-30 NOTE — Telephone Encounter (Signed)
-----   Message from Mosie Lukes, MD sent at 11/11/2014  5:28 PM EST ----- Please make sure she has a CMP and TSH prior to her visit in May. For essential HTN and abnormal thyroid studies ----- Message -----    From: Sanda Klein, MD    Sent: 11/11/2014   5:19 PM      To: Mosie Lukes, MD, Tressa Busman, CMA  Cholesterol parameters are acceptable. Borderline abnormal thyroid test. Normal LFTs. Please repeat CMET and TSH and do free T4 in 6 months.

## 2014-12-01 MED ORDER — LEVOTHYROXINE SODIUM 125 MCG PO TABS: 125.0000 ug | ORAL_TABLET | Freq: Every day | ORAL | Status: DC

## 2014-12-01 NOTE — Telephone Encounter (Signed)
Refill done.  

## 2014-12-05 ENCOUNTER — Telehealth: Payer: Self-pay | Admitting: Family Medicine

## 2014-12-05 MED ORDER — PRAVASTATIN SODIUM 40 MG PO TABS: 40.0000 mg | ORAL_TABLET | Freq: Every day | ORAL | Status: DC

## 2014-12-05 NOTE — Telephone Encounter (Signed)
Called the patient to inform sent in to mail order and did a #10 day supply to [facility] to cover her until mail order arrives.

## 2014-12-05 NOTE — Telephone Encounter (Signed)
Caller name:Jill Washington Relationship to patient:self Can be reached:[phone] Pharmacy: [facility] [phone]   Reason for call: PT was prescribed pravastatin (PRAVACHOL) 40 MG tablet by Dr. Lenna Gilford back in may 2015- has one pill left. Wanting to see if Dr Charlett Blake will refill or if she needs to contact other MD office.

## 2014-12-16 ENCOUNTER — Ambulatory Visit (INDEPENDENT_AMBULATORY_CARE_PROVIDER_SITE_OTHER): Payer: Medicare Other | Admitting: Pharmacist Clinician (PhC)/ Clinical Pharmacy Specialist

## 2014-12-16 DIAGNOSIS — Z7901 Long term (current) use of anticoagulants: Secondary | ICD-10-CM

## 2014-12-16 DIAGNOSIS — I480 Paroxysmal atrial fibrillation: Secondary | ICD-10-CM

## 2014-12-16 DIAGNOSIS — I4891 Unspecified atrial fibrillation: Secondary | ICD-10-CM | POA: Diagnosis not present

## 2014-12-16 LAB — POCT INR: INR: 3.3

## 2014-12-22 ENCOUNTER — Other Ambulatory Visit: Payer: Self-pay | Admitting: Family Medicine

## 2014-12-22 MED ORDER — ESOMEPRAZOLE MAGNESIUM 40 MG PO CPDR: DELAYED_RELEASE_CAPSULE | ORAL | Status: DC

## 2014-12-30 DIAGNOSIS — H25812 Combined forms of age-related cataract, left eye: Secondary | ICD-10-CM | POA: Diagnosis not present

## 2014-12-30 DIAGNOSIS — H2511 Age-related nuclear cataract, right eye: Secondary | ICD-10-CM | POA: Diagnosis not present

## 2014-12-30 DIAGNOSIS — H2512 Age-related nuclear cataract, left eye: Secondary | ICD-10-CM | POA: Diagnosis not present

## 2015-01-06 ENCOUNTER — Ambulatory Visit: Payer: Medicare Other | Admitting: Pharmacist Clinician (PhC)/ Clinical Pharmacy Specialist

## 2015-01-06 ENCOUNTER — Other Ambulatory Visit: Payer: Self-pay | Admitting: Family Medicine

## 2015-01-11 ENCOUNTER — Telehealth: Payer: Self-pay | Admitting: *Deleted

## 2015-01-11 NOTE — Telephone Encounter (Signed)
Refilled request from [facility] for Dr. Randel Pigg.

## 2015-01-11 NOTE — Telephone Encounter (Signed)
Informed patient that cell adaptor has been mailed. Patient voiced understanding.

## 2015-01-16 ENCOUNTER — Ambulatory Visit (INDEPENDENT_AMBULATORY_CARE_PROVIDER_SITE_OTHER): Payer: Medicare Other | Admitting: Pharmacist Clinician (PhC)/ Clinical Pharmacy Specialist

## 2015-01-16 DIAGNOSIS — Z7901 Long term (current) use of anticoagulants: Secondary | ICD-10-CM | POA: Diagnosis not present

## 2015-01-16 DIAGNOSIS — I4891 Unspecified atrial fibrillation: Secondary | ICD-10-CM

## 2015-01-16 LAB — POCT INR: INR: 2.5

## 2015-01-27 DIAGNOSIS — H2512 Age-related nuclear cataract, left eye: Secondary | ICD-10-CM | POA: Diagnosis not present

## 2015-01-27 DIAGNOSIS — H25811 Combined forms of age-related cataract, right eye: Secondary | ICD-10-CM | POA: Diagnosis not present

## 2015-01-27 DIAGNOSIS — H2511 Age-related nuclear cataract, right eye: Secondary | ICD-10-CM | POA: Diagnosis not present

## 2015-01-31 ENCOUNTER — Encounter: Payer: Self-pay | Admitting: Cardiovascular Disease

## 2015-01-31 ENCOUNTER — Other Ambulatory Visit: Payer: Self-pay | Admitting: Cardiovascular Disease

## 2015-01-31 ENCOUNTER — Ambulatory Visit (INDEPENDENT_AMBULATORY_CARE_PROVIDER_SITE_OTHER): Payer: Medicare Other | Admitting: Cardiovascular Disease

## 2015-01-31 ENCOUNTER — Ambulatory Visit
Admission: RE | Admit: 2015-01-31 | Discharge: 2015-01-31 | Disposition: A | Discharge status: Home / Self Care | Payer: Medicare Other | Source: Ambulatory Visit | Attending: Cardiovascular Disease | Admitting: Cardiovascular Disease

## 2015-01-31 VITALS — BP 110/60 | HR 70 | Ht 67.0 in | Wt 188.4 lb

## 2015-01-31 DIAGNOSIS — I480 Paroxysmal atrial fibrillation: Secondary | ICD-10-CM | POA: Diagnosis not present

## 2015-01-31 DIAGNOSIS — I442 Atrioventricular block, complete: Secondary | ICD-10-CM | POA: Diagnosis not present

## 2015-01-31 DIAGNOSIS — I10 Essential (primary) hypertension: Secondary | ICD-10-CM | POA: Diagnosis not present

## 2015-01-31 DIAGNOSIS — R062 Wheezing: Secondary | ICD-10-CM

## 2015-01-31 DIAGNOSIS — Z950 Presence of cardiac pacemaker: Secondary | ICD-10-CM

## 2015-01-31 DIAGNOSIS — E7800 Pure hypercholesterolemia, unspecified: Secondary | ICD-10-CM

## 2015-01-31 DIAGNOSIS — J984 Other disorders of lung: Secondary | ICD-10-CM | POA: Diagnosis not present

## 2015-01-31 DIAGNOSIS — I495 Sick sinus syndrome: Secondary | ICD-10-CM

## 2015-01-31 LAB — MDC_IDC_ENUM_SESS_TYPE_INCLINIC
Battery Remaining Longevity: 100.8 mo
Brady Statistic RA Percent Paced: 96 %
Brady Statistic RV Percent Paced: 99.99 %
Date Time Interrogation Session: 20160419130038
Implantable Pulse Generator Serial Number: 7338831
Lead Channel Impedance Value: 437.5 Ohm
Lead Channel Impedance Value: 525 Ohm
Lead Channel Pacing Threshold Amplitude: 0.875 V
Lead Channel Pacing Threshold Pulse Width: 0.4 ms
Lead Channel Pacing Threshold Pulse Width: 0.4 ms
Lead Channel Sensing Intrinsic Amplitude: 12 mV
Lead Channel Sensing Intrinsic Amplitude: 3 mV
Lead Channel Setting Pacing Amplitude: 1.125
Lead Channel Setting Pacing Amplitude: 1.625
Lead Channel Setting Pacing Pulse Width: 0.4 ms
MDC IDC MSMT BATTERY VOLTAGE: 2.95 V
MDC IDC MSMT LEADCHNL RA PACING THRESHOLD AMPLITUDE: 0.625 V
MDC IDC SET LEADCHNL RV SENSING SENSITIVITY: 4 mV

## 2015-01-31 NOTE — Patient Instructions (Addendum)
A chest x-ray takes a picture of the organs and structures inside the chest, including the heart, lungs, and blood vessels. This test can show several things, including, whether the heart is enlarges; whether fluid is building up in the lungs; and whether pacemaker / defibrillator leads are still in place.  [employer] 1st [address].   Remote monitoring is used to monitor your pacemaker from home. This monitoring reduces the number of office visits required to check your device to one time per year. It allows [place] to keep an eye on the functioning of your device to ensure it is working properly. You are scheduled for a device check from home on 05/02/2015. You may send your transmission at any time that day. If you have a wireless device, the transmission will be sent automatically. After your physician reviews your transmission, you will receive a postcard with your next transmission date.  Your physician recommends that you schedule a follow-up appointment in: 12 months with Dr.Croitoru

## 2015-01-31 NOTE — Progress Notes (Signed)
Patient ID: Jill Washington, female   DOB: 1926/03/09, 81 y.o.   MRN: [id number]     Cardiology Office Note   Date:  01/31/2015   ID:  Jill Washington, DOB 1926/08/02, MRN [id number]  PCP:  [name], MD  Cardiologist:   Sanda Klein, MD   Chief Complaint  Patient presents with  . Follow-up    3 months. has not set up home device transmitter      History of Present Illness: Jill Washington is a 81 y.o. female who presents for Complete heart block, sinus node dysfunction, paroxysmal atrial fibrillation, pacemaker check  Jill Washington has no cardiovascular complaints. However, over the last 3 weeks she has had persistent wheezing and a cough productive of scanty amounts of sputum. She denies a history of allergies and has not had fever or chills. She is not particularly short of breath.  She is pacemaker dependent due to complete heart block and also has severe sinus node dysfunction, often without any detectable atrial activity. She has recurring paroxysmal atrial fibrillation, as far as I can tell, the arrhythmia is not particularly symptomatic. She has complete heart block and is 100% ventricular paced so rapid ventricular rates are never a problem. At one point she was treated with multaq with very poor results, with a burden of atrial fibrillation that approached 40-50%. After switching to amiodarone the arrhythmia has been controlled much better. At this point, her pacemaker check shows a burden of atrial fibrillation of <2% .any episodes in the last 7 months. She has not  previously had any clear side effects from amiodarone.   She has chronic stasis dermatitis and mild lower showed edema related to peripheral venous insufficiency She is on chronic warfarin anticoagulation and has not had stroke/TIA or any serious bleeding problems. Had normal coronary angiography 1998. A nuclear stress test performed November 2014 showed normal findings. mildly decreased left ventricular  systolic function by echo October 2014 (EF 45-50 percent with pronounced septal dyssynergy due to RV pacing). Conversely nuclear scintigraphy shows ejection fraction of 58%. Suspect discrepancy is related to paradoxical septal motion due to right ventricular apical pacing.     Past Medical History  Diagnosis Date  . History of empyema of pleura   . Hypertension   . Mitral valve prolapse   . History of atrial fibrillation   . Sick sinus syndrome   . Venous insufficiency   . Hypercholesteremia   . Hypothyroidism   . Hiatal hernia   . IBS (irritable bowel syndrome)   . Colon polyps     hyperplastic  . Fibromyalgia   . Vitamin D deficiency   . Peripheral neuropathy   . Critical illness polyneuropathy   . History of skin cancer     face  . Diverticulosis of sigmoid colon 02/05/2007    mild  . GERD (gastroesophageal reflux disease)   . Pacemaker   . Arthritis   . History of gallstones   . Fecal incontinence   . Urinary incontinence   . Blood transfusion   . Chicken pox as a child  . Measles as a child  . Mumps as a child  . Shingles 8 yrs ago  . Overactive bladder 05/07/2011  . CHB (complete heart block) 11/10/2014    Past Surgical History  Procedure Laterality Date  . Cervical spine surgery    . Cholecystectomy  03/2005    by Dr. Dalbert Batman  . Total abdominal hysterectomy w/ bilateral salpingoophorectomy    . Colonoscopy  02/05/2007,02/05/05    Dr. Silvano Rusk  . Esophagogastroduodenoscopy  02/05/2005,01/22/90    Dr. Silvano Rusk, Dr. Rachelle Hora  . Pacemaker implanted  05/11/97,08/02/05    [facility]  . Appendectomy  1971  . Tonsillectomy    . Cardiac catheterization  05/10/1997    Normal coronaries, MVP  . Nm myocar perf wall motion  08/24/2010    Normal  . Pacemaker generator change N/A 02/25/2012    Procedure: PACEMAKER GENERATOR CHANGE;  Surgeon: Sanda Klein, MD;  Location: [place] [facility];  Service: Cardiovascular;  Laterality: N/A;     Current Outpatient  Prescriptions  Medication Sig Dispense Refill  . amiodarone (PACERONE) 200 MG tablet Take 1 tablet (200 mg total) by mouth daily. 90 tablet 3  . atenolol (TENORMIN) 50 MG tablet Take 1.5 tablets (75 mg total) by mouth daily. 45 tablet 1  . Cholecalciferol (VITAMIN D PO) Take 1 tablet by mouth daily.    Marland Kitchen esomeprazole (NEXIUM) 40 MG capsule Take 40 mg by mouth daily before breakfast.    . esomeprazole (NEXIUM) 40 MG capsule TAKE 1 CAPSULE DAILY BEFORE BREAKFAST 90 capsule 2  . furosemide (LASIX) 20 MG tablet 2 tabs po daily x 3 days then 1 tab po daily then 1 tab po daily prn SOB/Edema/weight Gain>3# in 24 hours. 40 tablet 1  . gabapentin (NEURONTIN) 300 MG capsule TAKE 1 CAPSULE TWICE A DAY 180 capsule 3  . levothyroxine (SYNTHROID, LEVOTHROID) 125 MCG tablet Take 1 tablet (125 mcg total) by mouth daily before breakfast. 90 tablet 1  . loperamide (IMODIUM) 2 MG capsule Take 2 mg by mouth 4 (four) times daily as needed for diarrhea or loose stools.     . mirabegron ER (MYRBETRIQ) 50 MG TB24 Take 50 mg by mouth daily.    . Multiple Vitamins-Minerals (ONE-A-DAY WOMENS 50 PLUS PO) Take 1 tablet by mouth daily.    . pravastatin (PRAVACHOL) 40 MG tablet Take 1 tablet (40 mg total) by mouth daily. 10 tablet 0  . traMADol (ULTRAM) 50 MG tablet Take 1 tablet (50 mg total) by mouth 2 (two) times daily. 180 tablet 0  . valsartan-hydrochlorothiazide (DIOVAN-HCT) 80-12.5 MG per tablet TAKE 1 TABLET DAILY 90 tablet 1  . vitamin B-12 (CYANOCOBALAMIN) 500 MCG tablet Take 500 mcg by mouth daily.    Marland Kitchen warfarin (COUMADIN) 2 MG tablet TAKE 1 TABLET DAILY OR AS DIRECTED 90 tablet 0  . zoster vaccine live, PF, (ZOSTAVAX) 32440 UNT/0.65ML injection Inject 19,400 Units into the skin once. 1 each 0   No current facility-administered medications for this visit.    Allergies:   Cymbalta; Iodinated diagnostic agents; and Other    Social History:  The patient  reports that she quit smoking about 63 years ago. Her  smoking use included Cigarettes. She has a 12 pack-year smoking history. She has never used smokeless tobacco. She reports that she does not drink alcohol or use illicit drugs.   Family History:  The patient's family history includes Alcohol abuse in her daughter; Cancer (age of onset: 20) in her sister; Colon cancer in an other family member; Diabetes in her brother, brother, daughter, father, sister, sister, and son; Diabetes (age of onset: 30) in her mother; Gallbladder disease in her father; Heart attack in her brother; Hypertension in her daughter and sister; Pancreatic cancer in her mother; Stomach cancer in her maternal aunt.    ROS:  Please see the history of present illness.    The patient specifically denies any chest  pain at rest or with exertion, dyspnea at rest or with exertion, orthopnea, paroxysmal nocturnal dyspnea, syncope, palpitations, focal neurological deficits, intermittent claudication, lower extremity edema, unexplained weight gain, hemoptysis.  The patient also denies abdominal pain, nausea, vomiting, dysphagia, diarrhea, constipation, polyuria, polydipsia, dysuria, hematuria, frequency, urgency, abnormal bleeding or bruising, fever, chills, unexpected weight changes, mood swings, change in skin or hair texture, change in voice quality, auditory or visual problems, allergic reactions or rashes, new musculoskeletal complaints other than usual "aches and pains".  All other systems are reviewed and negative.    PHYSICAL EXAM: VS:  BP 110/60 mmHg  Pulse 70  Ht 5\' 7"  (1.702 m)  Wt 188 lb 6.4 oz (85.458 kg)  BMI 29.50 kg/m2 , BMI Body mass index is 29.5 kg/(m^2).  General: Alert, oriented x3, no distress Head: no evidence of trauma, PERRL, EOMI, no exophtalmos or lid lag, no myxedema, no xanthelasma; normal ears, nose and oropharynx Neck: normal jugular venous pulsations and no hepatojugular reflux; brisk carotid pulses without delay and no carotid bruits Chest: clear to  auscultation, no signs of consolidation by percussion or palpation, normal fremitus, symmetrical and full respiratory excursions Cardiovascular: normal position and quality of the apical impulse, regular rhythm, normal first and paradoxically split second heart sounds, no murmurs, rubs or gallops Abdomen: no tenderness or distention, no masses by palpation, no abnormal pulsatility or arterial bruits, normal bowel sounds, no hepatosplenomegaly Extremities: Prominent varicose veins and chronic brawny edema bilaterally, no clubbing, cyanosis; 2+ radial, ulnar and brachial pulses bilaterally; 2+ right femoral, posterior tibial and dorsalis pedis pulses; 2+ left femoral, posterior tibial and dorsalis pedis pulses; no subclavian or femoral bruits Neurological: grossly nonfocal Psych: euthymic mood, full affect   EKG:  EKG is ordered today. The ekg ordered today demonstrates AV sequential pacing Recent Labs: 03/31/2014: Hemoglobin 12.9; Platelets 180 11/10/2014: ALT 12; BUN 14; Creatinine 1.31*; Potassium 4.7; Sodium 142; TSH 5.331*    Lipid Panel    Component Value Date/Time   CHOL 151 11/10/2014 0949   TRIG 206* 11/10/2014 0949   HDL 40 11/10/2014 0949   CHOLHDL 3.8 11/10/2014 0949   VLDL 41* 11/10/2014 0949   LDLCALC 70 11/10/2014 0949   LDLDIRECT 148.9 06/28/2008 0854      Wt Readings from Last 3 Encounters:  01/31/15 188 lb 6.4 oz (85.458 kg)  11/10/14 193 lb 3.2 oz (87.635 kg)  03/31/14 192 lb 1.3 oz (87.127 kg)      ASSESSMENT AND PLAN:  1. complete heart block, pacemaker dependent - normal device function, remote [employer] transmission every 3 months and office visit yearly 2. History of recurrent paroxysmal atrial fibrillation without any recorded events in September 2015, on appropriate anticoagulation with warfarin, uncomplicated by bleeding or embolic events 3. Chronic amiodarone therapy - she may simply be having an episode of acute viral bronchitis or allergies, but I have  recommended she have a chest x-ray since the current wheezing isn't usual for her. Also reminded her about the importance of avoiding sun exposure while on amiodarone, yearly ophthalmological exam and periodic assessment of liver and thyroid function tests. 4. Hypercholesterolemia with excellent recent lipid profile pravastatin note history of normal coronary arteries by angiography in 1998, normal nuclear stress test 2014  Current medicines are reviewed at length with the patient today.  The patient does not have concerns regarding medicines.  The following changes have been made:  no change  Labs/ tests ordered today include:  No orders of the defined types were placed  in this encounter.   Patient Instructions  A chest x-ray takes a picture of the organs and structures inside the chest, including the heart, lungs, and blood vessels. This test can show several things, including, whether the heart is enlarges; whether fluid is building up in the lungs; and whether pacemaker / defibrillator leads are still in place.  [employer] [address].   Remote monitoring is used to monitor your pacemaker from home. This monitoring reduces the number of office visits required to check your device to one time per year. It allows [place] to keep an eye on the functioning of your device to ensure it is working properly. You are scheduled for a device check from home on 05/02/2015. You may send your transmission at any time that day. If you have a wireless device, the transmission will be sent automatically. After your physician reviews your transmission, you will receive a postcard with your next transmission date.  Your physician recommends that you schedule a follow-up appointment in: 12 months with Dr.Reah Washington    Signed, Sanda Klein, MD  01/31/2015 10:24 AM    Sanda Klein, MD, [facility] [phone] office [phone] pager

## 2015-02-01 NOTE — Progress Notes (Signed)
[facility] for CXR results

## 2015-02-08 ENCOUNTER — Encounter: Payer: Self-pay | Admitting: Cardiovascular Disease

## 2015-02-13 ENCOUNTER — Ambulatory Visit (INDEPENDENT_AMBULATORY_CARE_PROVIDER_SITE_OTHER): Payer: Medicare Other | Admitting: Pharmacist Clinician (PhC)/ Clinical Pharmacy Specialist

## 2015-02-13 DIAGNOSIS — I4891 Unspecified atrial fibrillation: Secondary | ICD-10-CM | POA: Diagnosis not present

## 2015-02-13 DIAGNOSIS — Z7901 Long term (current) use of anticoagulants: Secondary | ICD-10-CM

## 2015-02-13 LAB — POCT INR: INR: 4

## 2015-02-20 ENCOUNTER — Ambulatory Visit: Payer: Medicare Other | Admitting: Family Medicine

## 2015-03-01 ENCOUNTER — Ambulatory Visit (INDEPENDENT_AMBULATORY_CARE_PROVIDER_SITE_OTHER): Payer: Medicare Other | Admitting: Pharmacist Clinician (PhC)/ Clinical Pharmacy Specialist

## 2015-03-01 DIAGNOSIS — Z7901 Long term (current) use of anticoagulants: Secondary | ICD-10-CM

## 2015-03-01 DIAGNOSIS — I4891 Unspecified atrial fibrillation: Secondary | ICD-10-CM

## 2015-03-01 LAB — POCT INR: INR: 3.3

## 2015-03-08 ENCOUNTER — Other Ambulatory Visit: Payer: Self-pay | Admitting: Cardiovascular Disease

## 2015-03-08 NOTE — Telephone Encounter (Signed)
Rx has been sent to the pharmacy electronically. ° °

## 2015-03-22 ENCOUNTER — Ambulatory Visit: Payer: Medicare Other | Admitting: Pharmacist Clinician (PhC)/ Clinical Pharmacy Specialist

## 2015-03-24 ENCOUNTER — Ambulatory Visit (INDEPENDENT_AMBULATORY_CARE_PROVIDER_SITE_OTHER): Payer: Medicare Other | Admitting: Pharmacist Clinician (PhC)/ Clinical Pharmacy Specialist

## 2015-03-24 DIAGNOSIS — I4891 Unspecified atrial fibrillation: Secondary | ICD-10-CM

## 2015-03-24 DIAGNOSIS — Z7901 Long term (current) use of anticoagulants: Secondary | ICD-10-CM

## 2015-03-24 LAB — POCT INR: INR: 3.4

## 2015-04-07 ENCOUNTER — Ambulatory Visit (INDEPENDENT_AMBULATORY_CARE_PROVIDER_SITE_OTHER): Payer: Medicare Other | Admitting: Pharmacist

## 2015-04-07 DIAGNOSIS — I4891 Unspecified atrial fibrillation: Secondary | ICD-10-CM | POA: Diagnosis not present

## 2015-04-07 DIAGNOSIS — Z7901 Long term (current) use of anticoagulants: Secondary | ICD-10-CM

## 2015-04-07 LAB — POCT INR: INR: 1.1

## 2015-04-21 ENCOUNTER — Ambulatory Visit (INDEPENDENT_AMBULATORY_CARE_PROVIDER_SITE_OTHER): Payer: Medicare Other | Admitting: Pharmacist Clinician (PhC)/ Clinical Pharmacy Specialist

## 2015-04-21 DIAGNOSIS — I4891 Unspecified atrial fibrillation: Secondary | ICD-10-CM | POA: Diagnosis not present

## 2015-04-21 DIAGNOSIS — Z7901 Long term (current) use of anticoagulants: Secondary | ICD-10-CM

## 2015-04-21 LAB — POCT INR: INR: 1.3

## 2015-04-25 ENCOUNTER — Other Ambulatory Visit (INDEPENDENT_AMBULATORY_CARE_PROVIDER_SITE_OTHER): Payer: Medicare Other

## 2015-04-25 DIAGNOSIS — I10 Essential (primary) hypertension: Secondary | ICD-10-CM | POA: Diagnosis not present

## 2015-04-25 DIAGNOSIS — R946 Abnormal results of thyroid function studies: Secondary | ICD-10-CM | POA: Diagnosis not present

## 2015-04-25 LAB — COMPREHENSIVE METABOLIC PANEL
ALBUMIN: 3.8 g/dL (ref 3.5–5.2)
ALT: 13 U/L (ref 0–35)
AST: 21 U/L (ref 0–37)
Alkaline Phosphatase: 53 U/L (ref 39–117)
BILIRUBIN TOTAL: 0.5 mg/dL (ref 0.2–1.2)
BUN: 22 mg/dL (ref 6–23)
CO2: 25 meq/L (ref 19–32)
Calcium: 9.2 mg/dL (ref 8.4–10.5)
Chloride: 108 mEq/L (ref 96–112)
Creatinine, Ser: 1.71 mg/dL — ABNORMAL HIGH (ref 0.40–1.20)
GFR: 30.02 mL/min — ABNORMAL LOW (ref >60.00)
Glucose, Bld: 89 mg/dL (ref 70–99)
POTASSIUM: 4.5 meq/L (ref 3.5–5.1)
Sodium: 141 mEq/L (ref 135–145)
Total Protein: 6.7 g/dL (ref 6.0–8.3)

## 2015-04-25 LAB — TSH: TSH: 4.7 u[IU]/mL — ABNORMAL HIGH (ref 0.35–4.50)

## 2015-04-25 LAB — T4, FREE: FREE T4: 1.28 ng/dL (ref 0.60–1.60)

## 2015-04-27 ENCOUNTER — Encounter: Payer: Self-pay | Admitting: *Deleted

## 2015-04-27 ENCOUNTER — Telehealth: Payer: Self-pay | Admitting: *Deleted

## 2015-04-27 NOTE — Telephone Encounter (Signed)
Pre-Visit Call completed with patient and chart updated.   Pre-Visit Info documented in Specialty Comments under [employer].    

## 2015-04-28 ENCOUNTER — Encounter: Payer: Self-pay | Admitting: Family Medicine

## 2015-04-28 ENCOUNTER — Ambulatory Visit (INDEPENDENT_AMBULATORY_CARE_PROVIDER_SITE_OTHER): Payer: Medicare Other | Admitting: Family Medicine

## 2015-04-28 VITALS — BP 130/88 | HR 86 | Temp 98.1°F | Ht 67.0 in | Wt 191.2 lb

## 2015-04-28 DIAGNOSIS — E7800 Pure hypercholesterolemia, unspecified: Secondary | ICD-10-CM

## 2015-04-28 DIAGNOSIS — Z0000 Encounter for general adult medical examination without abnormal findings: Secondary | ICD-10-CM

## 2015-04-28 DIAGNOSIS — D509 Iron deficiency anemia, unspecified: Secondary | ICD-10-CM

## 2015-04-28 DIAGNOSIS — I10 Essential (primary) hypertension: Secondary | ICD-10-CM

## 2015-04-28 DIAGNOSIS — Z7901 Long term (current) use of anticoagulants: Secondary | ICD-10-CM

## 2015-04-28 DIAGNOSIS — E538 Deficiency of other specified B group vitamins: Secondary | ICD-10-CM

## 2015-04-28 DIAGNOSIS — E039 Hypothyroidism, unspecified: Secondary | ICD-10-CM | POA: Diagnosis not present

## 2015-04-28 DIAGNOSIS — Z780 Asymptomatic menopausal state: Secondary | ICD-10-CM

## 2015-04-28 DIAGNOSIS — E559 Vitamin D deficiency, unspecified: Secondary | ICD-10-CM | POA: Diagnosis not present

## 2015-04-28 DIAGNOSIS — N3281 Overactive bladder: Secondary | ICD-10-CM

## 2015-04-28 DIAGNOSIS — I442 Atrioventricular block, complete: Secondary | ICD-10-CM

## 2015-04-28 LAB — COMPREHENSIVE METABOLIC PANEL
ALBUMIN: 4 g/dL (ref 3.5–5.2)
ALT: 13 U/L (ref 0–35)
AST: 23 U/L (ref 0–37)
Alkaline Phosphatase: 54 U/L (ref 39–117)
BILIRUBIN TOTAL: 0.5 mg/dL (ref 0.2–1.2)
BUN: 23 mg/dL (ref 6–23)
CO2: 28 meq/L (ref 19–32)
Calcium: 9.7 mg/dL (ref 8.4–10.5)
Chloride: 106 mEq/L (ref 96–112)
Creatinine, Ser: 1.69 mg/dL — ABNORMAL HIGH (ref 0.40–1.20)
GFR: 30.43 mL/min — ABNORMAL LOW (ref >60.00)
GLUCOSE: 84 mg/dL (ref 70–99)
Potassium: 5.2 mEq/L — ABNORMAL HIGH (ref 3.5–5.1)
SODIUM: 141 meq/L (ref 135–145)
Total Protein: 7.3 g/dL (ref 6.0–8.3)

## 2015-04-28 LAB — CBC
HEMATOCRIT: 38.1 % (ref 36.0–46.0)
HEMOGLOBIN: 13.1 g/dL (ref 12.0–15.0)
MCHC: 34.3 g/dL (ref 30.0–36.0)
MCV: 93 fl (ref 78.0–100.0)
Platelets: 208 10*3/uL (ref 150.0–400.0)
RBC: 4.09 Mil/uL (ref 3.87–5.11)
RDW: 13.4 % (ref 11.5–15.5)
WBC: 7.3 10*3/uL (ref 4.0–10.5)

## 2015-04-28 LAB — LIPID PANEL
CHOLESTEROL: 158 mg/dL (ref 0–200)
HDL: 43.3 mg/dL (ref >39.00)
NONHDL: 114.7
Total CHOL/HDL Ratio: 4
Triglycerides: 226 mg/dL — ABNORMAL HIGH (ref 0.0–149.0)
VLDL: 45.2 mg/dL — ABNORMAL HIGH (ref 0.0–40.0)

## 2015-04-28 LAB — LDL CHOLESTEROL, DIRECT: Direct LDL: 77 mg/dL

## 2015-04-28 LAB — VITAMIN B12: Vitamin B-12: 1305 pg/mL — ABNORMAL HIGH (ref 211–911)

## 2015-04-28 NOTE — Progress Notes (Signed)
Pre visit review using our clinic review tool, if applicable. No additional management support is needed unless otherwise documented below in the visit note. 

## 2015-04-28 NOTE — Patient Instructions (Addendum)
Clean with [facility] Astringent Daily antihistamine and a daily heartburn medicine Zyrtec 10 mg and Zantac 150 mg daily Recommend Vitamin D 2000 IU daily  If another itchy bugbite occurs  Preventive Care for Adults A healthy lifestyle and preventive care can promote health and wellness. Preventive health guidelines for women include the following key practices.  A routine yearly physical is a good way to check with your health care provider about your health and preventive screening. It is a chance to share any concerns and updates on your health and to receive a thorough exam.  Visit your dentist for a routine exam and preventive care every 6 months. Brush your teeth twice a day and floss once a day. Good oral hygiene prevents tooth decay and gum disease.  The frequency of eye exams is based on your age, health, family medical history, use of contact lenses, and other factors. Follow your health care provider's recommendations for frequency of eye exams.  Eat a healthy diet. Foods like vegetables, fruits, whole grains, low-fat dairy products, and lean protein foods contain the nutrients you need without too many calories. Decrease your intake of foods high in solid fats, added sugars, and salt. Eat the right amount of calories for you.Get information about a proper diet from your health care provider, if necessary.  Regular physical exercise is one of the most important things you can do for your health. Most adults should get at least 150 minutes of moderate-intensity exercise (any activity that increases your heart rate and causes you to sweat) each week. In addition, most adults need muscle-strengthening exercises on 2 or more days a week.  Maintain a healthy weight. The body mass index (BMI) is a screening tool to identify possible weight problems. It provides an estimate of body fat based on height and weight. Your health care provider can find your BMI and can help you achieve or  maintain a healthy weight.For adults 20 years and older:  A BMI below 18.5 is considered underweight.  A BMI of 18.5 to 24.9 is normal.  A BMI of 25 to 29.9 is considered overweight.  A BMI of 30 and above is considered obese.  Maintain normal blood lipids and cholesterol levels by exercising and minimizing your intake of saturated fat. Eat a balanced diet with plenty of fruit and vegetables. Blood tests for lipids and cholesterol should begin at age 61 and be repeated every 5 years. If your lipid or cholesterol levels are high, you are over 50, or you are at high risk for heart disease, you may need your cholesterol levels checked more frequently.Ongoing high lipid and cholesterol levels should be treated with medicines if diet and exercise are not working.  If you smoke, find out from your health care provider how to quit. If you do not use tobacco, do not start.  Lung cancer screening is recommended for adults aged 43-80 years who are at high risk for developing lung cancer because of a history of smoking. A yearly low-dose CT scan of the lungs is recommended for people who have at least a 30-pack-year history of smoking and are a current smoker or have quit within the past 15 years. A pack year of smoking is smoking an average of 1 pack of cigarettes a day for 1 year (for example: 1 pack a day for 30 years or 2 packs a day for 15 years). Yearly screening should continue until the smoker has stopped smoking for at least 15 years. Yearly  screening should be stopped for people who develop a health problem that would prevent them from having lung cancer treatment.  If you are pregnant, do not drink alcohol. If you are breastfeeding, be very cautious about drinking alcohol. If you are not pregnant and choose to drink alcohol, do not have more than 1 drink per day. One drink is considered to be 12 ounces (355 mL) of beer, 5 ounces (148 mL) of wine, or 1.5 ounces (44 mL) of liquor.  Avoid use of  street drugs. Do not share needles with anyone. Ask for help if you need support or instructions about stopping the use of drugs.  High blood pressure causes heart disease and increases the risk of stroke. Your blood pressure should be checked at least every 1 to 2 years. Ongoing high blood pressure should be treated with medicines if weight loss and exercise do not work.  If you are 30-61 years old, ask your health care provider if you should take aspirin to prevent strokes.  Diabetes screening involves taking a blood sample to check your fasting blood sugar level. This should be done once every 3 years, after age 52, if you are within normal weight and without risk factors for diabetes. Testing should be considered at a younger age or be carried out more frequently if you are overweight and have at least 1 risk factor for diabetes.  Breast cancer screening is essential preventive care for women. You should practice "breast self-awareness." This means understanding the normal appearance and feel of your breasts and may include breast self-examination. Any changes detected, no matter how small, should be reported to a health care provider. Women in their 22s and 30s should have a clinical breast exam (CBE) by a health care provider as part of a regular health exam every 1 to 3 years. After age 43, women should have a CBE every year. Starting at age 2, women should consider having a mammogram (breast X-ray test) every year. Women who have a family history of breast cancer should talk to their health care provider about genetic screening. Women at a high risk of breast cancer should talk to their health care providers about having an MRI and a mammogram every year.  Breast cancer gene (BRCA)-related cancer risk assessment is recommended for women who have family members with BRCA-related cancers. BRCA-related cancers include breast, ovarian, tubal, and peritoneal cancers. Having family members with these  cancers may be associated with an increased risk for harmful changes (mutations) in the breast cancer genes BRCA1 and BRCA2. Results of the assessment will determine the need for genetic counseling and BRCA1 and BRCA2 testing.  Routine pelvic exams to screen for cancer are no longer recommended for nonpregnant women who are considered low risk for cancer of the pelvic organs (ovaries, uterus, and vagina) and who do not have symptoms. Ask your health care provider if a screening pelvic exam is right for you.  If you have had past treatment for cervical cancer or a condition that could lead to cancer, you need Pap tests and screening for cancer for at least 20 years after your treatment. If Pap tests have been discontinued, your risk factors (such as having a new sexual partner) need to be reassessed to determine if screening should be resumed. Some women have medical problems that increase the chance of getting cervical cancer. In these cases, your health care provider may recommend more frequent screening and Pap tests.  The HPV test is an additional test  that may be used for cervical cancer screening. The HPV test looks for the virus that can cause the cell changes on the cervix. The cells collected during the Pap test can be tested for HPV. The HPV test could be used to screen women aged 82 years and older, and should be used in women of any age who have unclear Pap test results. After the age of 88, women should have HPV testing at the same frequency as a Pap test.  Colorectal cancer can be detected and often prevented. Most routine colorectal cancer screening begins at the age of 51 years and continues through age 11 years. However, your health care provider may recommend screening at an earlier age if you have risk factors for colon cancer. On a yearly basis, your health care provider may provide home test kits to check for hidden blood in the stool. Use of a small camera at the end of a tube, to  directly examine the colon (sigmoidoscopy or colonoscopy), can detect the earliest forms of colorectal cancer. Talk to your health care provider about this at age 20, when routine screening begins. Direct exam of the colon should be repeated every 5-10 years through age 92 years, unless early forms of pre-cancerous polyps or small growths are found.  People who are at an increased risk for hepatitis B should be screened for this virus. You are considered at high risk for hepatitis B if:  You were born in a country where hepatitis B occurs often. Talk with your health care provider about which countries are considered high risk.  Your parents were born in a high-risk country and you have not received a shot to protect against hepatitis B (hepatitis B vaccine).  You have HIV or AIDS.  You use needles to inject street drugs.  You live with, or have sex with, someone who has hepatitis B.  You get hemodialysis treatment.  You take certain medicines for conditions like cancer, organ transplantation, and autoimmune conditions.  Hepatitis C blood testing is recommended for all people born from [birth year] through 1965 and any individual with known risks for hepatitis C.  Practice safe sex. Use condoms and avoid high-risk sexual practices to reduce the spread of sexually transmitted infections (STIs). STIs include gonorrhea, chlamydia, syphilis, trichomonas, herpes, HPV, and human immunodeficiency virus (HIV). Herpes, HIV, and HPV are viral illnesses that have no cure. They can result in disability, cancer, and death.  You should be screened for sexually transmitted illnesses (STIs) including gonorrhea and chlamydia if:  You are sexually active and are younger than 24 years.  You are older than 24 years and your health care provider tells you that you are at risk for this type of infection.  Your sexual activity has changed since you were last screened and you are at an increased risk for chlamydia or  gonorrhea. Ask your health care provider if you are at risk.  If you are at risk of being infected with HIV, it is recommended that you take a prescription medicine daily to prevent HIV infection. This is called preexposure prophylaxis (PrEP). You are considered at risk if:  You are a heterosexual woman, are sexually active, and are at increased risk for HIV infection.  You take drugs by injection.  You are sexually active with a partner who has HIV.  Talk with your health care provider about whether you are at high risk of being infected with HIV. If you choose to begin PrEP, you should first  be tested for HIV. You should then be tested every 3 months for as long as you are taking PrEP.  Osteoporosis is a disease in which the bones lose minerals and strength with aging. This can result in serious bone fractures or breaks. The risk of osteoporosis can be identified using a bone density scan. Women ages 39 years and over and women at risk for fractures or osteoporosis should discuss screening with their health care providers. Ask your health care provider whether you should take a calcium supplement or vitamin D to reduce the rate of osteoporosis.  Menopause can be associated with physical symptoms and risks. Hormone replacement therapy is available to decrease symptoms and risks. You should talk to your health care provider about whether hormone replacement therapy is right for you.  Use sunscreen. Apply sunscreen liberally and repeatedly throughout the day. You should seek shade when your shadow is shorter than you. Protect yourself by wearing long sleeves, pants, a wide-brimmed hat, and sunglasses year round, whenever you are outdoors.  Once a month, do a whole body skin exam, using a mirror to look at the skin on your back. Tell your health care provider of new moles, moles that have irregular borders, moles that are larger than a pencil eraser, or moles that have changed in shape or  color.  Stay current with required vaccines (immunizations).  Influenza vaccine. All adults should be immunized every year.  Tetanus, diphtheria, and acellular pertussis (Td, Tdap) vaccine. Pregnant women should receive 1 dose of Tdap vaccine during each pregnancy. The dose should be obtained regardless of the length of time since the last dose. Immunization is preferred during the 27th-36th week of gestation. An adult who has not previously received Tdap or who does not know her vaccine status should receive 1 dose of Tdap. This initial dose should be followed by tetanus and diphtheria toxoids (Td) booster doses every 10 years. Adults with an unknown or incomplete history of completing a 3-dose immunization series with Td-containing vaccines should begin or complete a primary immunization series including a Tdap dose. Adults should receive a Td booster every 10 years.  Varicella vaccine. An adult without evidence of immunity to varicella should receive 2 doses or a second dose if she has previously received 1 dose. Pregnant females who do not have evidence of immunity should receive the first dose after pregnancy. This first dose should be obtained before leaving the health care facility. The second dose should be obtained 4-8 weeks after the first dose.  Human papillomavirus (HPV) vaccine. Females aged 13-26 years who have not received the vaccine previously should obtain the 3-dose series. The vaccine is not recommended for use in pregnant females. However, pregnancy testing is not needed before receiving a dose. If a female is found to be pregnant after receiving a dose, no treatment is needed. In that case, the remaining doses should be delayed until after the pregnancy. Immunization is recommended for any person with an immunocompromised condition through the age of 86 years if she did not get any or all doses earlier. During the 3-dose series, the second dose should be obtained 4-8 weeks after the  first dose. The third dose should be obtained 24 weeks after the first dose and 16 weeks after the second dose.  Zoster vaccine. One dose is recommended for adults aged 48 years or older unless certain conditions are present.  Measles, mumps, and rubella (MMR) vaccine. Adults born before [birth year] generally are considered immune to measles  and mumps. Adults born in [birth year] or later should have 1 or more doses of MMR vaccine unless there is a contraindication to the vaccine or there is laboratory evidence of immunity to each of the three diseases. A routine second dose of MMR vaccine should be obtained at least 28 days after the first dose for students attending postsecondary schools, health care workers, or international travelers. People who received inactivated measles vaccine or an unknown type of measles vaccine during 1963-1967 should receive 2 doses of MMR vaccine. People who received inactivated mumps vaccine or an unknown type of mumps vaccine before 1979 and are at high risk for mumps infection should consider immunization with 2 doses of MMR vaccine. For females of childbearing age, rubella immunity should be determined. If there is no evidence of immunity, females who are not pregnant should be vaccinated. If there is no evidence of immunity, females who are pregnant should delay immunization until after pregnancy. Unvaccinated health care workers born before [birth year] who lack laboratory evidence of measles, mumps, or rubella immunity or laboratory confirmation of disease should consider measles and mumps immunization with 2 doses of MMR vaccine or rubella immunization with 1 dose of MMR vaccine.  Pneumococcal 13-valent conjugate (PCV13) vaccine. When indicated, a person who is uncertain of her immunization history and has no record of immunization should receive the PCV13 vaccine. An adult aged 32 years or older who has certain medical conditions and has not been previously immunized should receive 1 dose of  PCV13 vaccine. This PCV13 should be followed with a dose of pneumococcal polysaccharide (PPSV23) vaccine. The PPSV23 vaccine dose should be obtained at least 8 weeks after the dose of PCV13 vaccine. An adult aged 7 years or older who has certain medical conditions and previously received 1 or more doses of PPSV23 vaccine should receive 1 dose of PCV13. The PCV13 vaccine dose should be obtained 1 or more years after the last PPSV23 vaccine dose.  Pneumococcal polysaccharide (PPSV23) vaccine. When PCV13 is also indicated, PCV13 should be obtained first. All adults aged 71 years and older should be immunized. An adult younger than age 51 years who has certain medical conditions should be immunized. Any person who resides in a nursing home or long-term care facility should be immunized. An adult smoker should be immunized. People with an immunocompromised condition and certain other conditions should receive both PCV13 and PPSV23 vaccines. People with human immunodeficiency virus (HIV) infection should be immunized as soon as possible after diagnosis. Immunization during chemotherapy or radiation therapy should be avoided. Routine use of PPSV23 vaccine is not recommended for American Indians, [place] Natives, or people younger than 65 years unless there are medical conditions that require PPSV23 vaccine. When indicated, people who have unknown immunization and have no record of immunization should receive PPSV23 vaccine. One-time revaccination 5 years after the first dose of PPSV23 is recommended for people aged 19-64 years who have chronic kidney failure, nephrotic syndrome, asplenia, or immunocompromised conditions. People who received 1-2 doses of PPSV23 before age 16 years should receive another dose of PPSV23 vaccine at age 47 years or later if at least 5 years have passed since the previous dose. Doses of PPSV23 are not needed for people immunized with PPSV23 at or after age 37 years.  Meningococcal vaccine.  Adults with asplenia or persistent complement component deficiencies should receive 2 doses of quadrivalent meningococcal conjugate (MenACWY-D) vaccine. The doses should be obtained at least 2 months apart. Microbiologists working with certain meningococcal bacteria,  [employer] recruits, people at risk during an outbreak, and people who travel to or live in countries with a high rate of meningitis should be immunized. A first-year college student up through age 40 years who is living in a residence hall should receive a dose if she did not receive a dose on or after her 16th birthday. Adults who have certain high-risk conditions should receive one or more doses of vaccine.  Hepatitis A vaccine. Adults who wish to be protected from this disease, have certain high-risk conditions, work with hepatitis A-infected animals, work in hepatitis A research labs, or travel to or work in countries with a high rate of hepatitis A should be immunized. Adults who were previously unvaccinated and who anticipate close contact with an international adoptee during the first 60 days after arrival in the [place] from a country with a high rate of hepatitis A should be immunized.  Hepatitis B vaccine. Adults who wish to be protected from this disease, have certain high-risk conditions, may be exposed to blood or other infectious body fluids, are household contacts or sex partners of hepatitis B positive people, are clients or workers in certain care facilities, or travel to or work in countries with a high rate of hepatitis B should be immunized.  Haemophilus influenzae type b (Hib) vaccine. A previously unvaccinated person with asplenia or sickle cell disease or having a scheduled splenectomy should receive 1 dose of Hib vaccine. Regardless of previous immunization, a recipient of a hematopoietic stem cell transplant should receive a 3-dose series 6-12 months after her successful transplant. Hib vaccine is not recommended for  adults with HIV infection. Preventive Services / Frequency Ages 12 to 24 years  Blood pressure check.** / Every 1 to 2 years.  Lipid and cholesterol check.** / Every 5 years beginning at age 10.  Clinical breast exam.** / Every 3 years for women in their 26s and 3s.  BRCA-related cancer risk assessment.** / For women who have family members with a BRCA-related cancer (breast, ovarian, tubal, or peritoneal cancers).  Pap test.** / Every 2 years from ages 12 through 50. Every 3 years starting at age 36 through age 57 or 32 with a history of 3 consecutive normal Pap tests.  HPV screening.** / Every 3 years from ages 64 through ages 91 to 72 with a history of 3 consecutive normal Pap tests.  Hepatitis C blood test.** / For any individual with known risks for hepatitis C.  Skin self-exam. / Monthly.  Influenza vaccine. / Every year.  Tetanus, diphtheria, and acellular pertussis (Tdap, Td) vaccine.** / Consult your health care provider. Pregnant women should receive 1 dose of Tdap vaccine during each pregnancy. 1 dose of Td every 10 years.  Varicella vaccine.** / Consult your health care provider. Pregnant females who do not have evidence of immunity should receive the first dose after pregnancy.  HPV vaccine. / 3 doses over 6 months, if 30 and younger. The vaccine is not recommended for use in pregnant females. However, pregnancy testing is not needed before receiving a dose.  Measles, mumps, rubella (MMR) vaccine.** / You need at least 1 dose of MMR if you were born in 1957 or later. You may also need a 2nd dose. For females of childbearing age, rubella immunity should be determined. If there is no evidence of immunity, females who are not pregnant should be vaccinated. If there is no evidence of immunity, females who are pregnant should delay immunization until after pregnancy.  Pneumococcal  13-valent conjugate (PCV13) vaccine.** / Consult your health care provider.  Pneumococcal  polysaccharide (PPSV23) vaccine.** / 1 to 2 doses if you smoke cigarettes or if you have certain conditions.  Meningococcal vaccine.** / 1 dose if you are age 52 to 61 years and a [employer] researcher living in a residence hall, or have one of several medical conditions, you need to get vaccinated against meningococcal disease. You may also need additional booster doses.  Hepatitis A vaccine.** / Consult your health care provider.  Hepatitis B vaccine.** / Consult your health care provider.  Haemophilus influenzae type b (Hib) vaccine.** / Consult your health care provider. Ages 68 to 71 years  Blood pressure check.** / Every 1 to 2 years.  Lipid and cholesterol check.** / Every 5 years beginning at age 55 years.  Lung cancer screening. / Every year if you are aged 77-80 years and have a 30-pack-year history of smoking and currently smoke or have quit within the past 15 years. Yearly screening is stopped once you have quit smoking for at least 15 years or develop a health problem that would prevent you from having lung cancer treatment.  Clinical breast exam.** / Every year after age 87 years.  BRCA-related cancer risk assessment.** / For women who have family members with a BRCA-related cancer (breast, ovarian, tubal, or peritoneal cancers).  Mammogram.** / Every year beginning at age 60 years and continuing for as long as you are in good health. Consult with your health care provider.  Pap test.** / Every 3 years starting at age 77 years through age 28 or 62 years with a history of 3 consecutive normal Pap tests.  HPV screening.** / Every 3 years from ages 45 years through ages 30 to 72 years with a history of 3 consecutive normal Pap tests.  Fecal occult blood test (FOBT) of stool. / Every year beginning at age 38 years and continuing until age 25 years. You may not need to do this test if you get a colonoscopy every 10 years.  Flexible sigmoidoscopy or colonoscopy.** / Every 5  years for a flexible sigmoidoscopy or every 10 years for a colonoscopy beginning at age 30 years and continuing until age 23 years.  Hepatitis C blood test.** / For all people born from [birth year] through 1963 and any individual with known risks for hepatitis C.  Skin self-exam. / Monthly.  Influenza vaccine. / Every year.  Tetanus, diphtheria, and acellular pertussis (Tdap/Td) vaccine.** / Consult your health care provider. Pregnant women should receive 1 dose of Tdap vaccine during each pregnancy. 1 dose of Td every 10 years.  Varicella vaccine.** / Consult your health care provider. Pregnant females who do not have evidence of immunity should receive the first dose after pregnancy.  Zoster vaccine.** / 1 dose for adults aged 38 years or older.  Measles, mumps, rubella (MMR) vaccine.** / You need at least 1 dose of MMR if you were born in 1957 or later. You may also need a 2nd dose. For females of childbearing age, rubella immunity should be determined. If there is no evidence of immunity, females who are not pregnant should be vaccinated. If there is no evidence of immunity, females who are pregnant should delay immunization until after pregnancy.  Pneumococcal 13-valent conjugate (PCV13) vaccine.** / Consult your health care provider.  Pneumococcal polysaccharide (PPSV23) vaccine.** / 1 to 2 doses if you smoke cigarettes or if you have certain conditions.  Meningococcal vaccine.** / Consult your health care provider.  Hepatitis A vaccine.** / Consult your health care provider.  Hepatitis B vaccine.** / Consult your health care provider.  Haemophilus influenzae type b (Hib) vaccine.** / Consult your health care provider. Ages 33 years and over  Blood pressure check.** / Every 1 to 2 years.  Lipid and cholesterol check.** / Every 5 years beginning at age 23 years.  Lung cancer screening. / Every year if you are aged 38-80 years and have a 30-pack-year history of smoking and currently  smoke or have quit within the past 15 years. Yearly screening is stopped once you have quit smoking for at least 15 years or develop a health problem that would prevent you from having lung cancer treatment.  Clinical breast exam.** / Every year after age 39 years.  BRCA-related cancer risk assessment.** / For women who have family members with a BRCA-related cancer (breast, ovarian, tubal, or peritoneal cancers).  Mammogram.** / Every year beginning at age 74 years and continuing for as long as you are in good health. Consult with your health care provider.  Pap test.** / Every 3 years starting at age 34 years through age 31 or 72 years with 3 consecutive normal Pap tests. Testing can be stopped between 65 and 70 years with 3 consecutive normal Pap tests and no abnormal Pap or HPV tests in the past 10 years.  HPV screening.** / Every 3 years from ages 3 years through ages 50 or 88 years with a history of 3 consecutive normal Pap tests. Testing can be stopped between 65 and 70 years with 3 consecutive normal Pap tests and no abnormal Pap or HPV tests in the past 10 years.  Fecal occult blood test (FOBT) of stool. / Every year beginning at age 36 years and continuing until age 40 years. You may not need to do this test if you get a colonoscopy every 10 years.  Flexible sigmoidoscopy or colonoscopy.** / Every 5 years for a flexible sigmoidoscopy or every 10 years for a colonoscopy beginning at age 57 years and continuing until age 49 years.  Hepatitis C blood test.** / For all people born from [birth year] through 1963 and any individual with known risks for hepatitis C.  Osteoporosis screening.** / A one-time screening for women ages 26 years and over and women at risk for fractures or osteoporosis.  Skin self-exam. / Monthly.  Influenza vaccine. / Every year.  Tetanus, diphtheria, and acellular pertussis (Tdap/Td) vaccine.** / 1 dose of Td every 10 years.  Varicella vaccine.** / Consult your  health care provider.  Zoster vaccine.** / 1 dose for adults aged 104 years or older.  Pneumococcal 13-valent conjugate (PCV13) vaccine.** / Consult your health care provider.  Pneumococcal polysaccharide (PPSV23) vaccine.** / 1 dose for all adults aged 19 years and older.  Meningococcal vaccine.** / Consult your health care provider.  Hepatitis A vaccine.** / Consult your health care provider.  Hepatitis B vaccine.** / Consult your health care provider.  Haemophilus influenzae type b (Hib) vaccine.** / Consult your health care provider. ** Family history and personal history of risk and conditions may change your health care provider's recommendations. Document Released: 11/26/2001 Document Revised: 02/14/2014 Document Reviewed: 02/25/2011 [facility] Patient Information 2015 [place], [place]. This information is not intended to replace advice given to you by your health care provider. Make sure you discuss any questions you have with your health care provider.

## 2015-04-29 ENCOUNTER — Other Ambulatory Visit: Payer: Self-pay | Admitting: Cardiovascular Disease

## 2015-05-01 ENCOUNTER — Telehealth: Payer: Self-pay | Admitting: Cardiovascular Disease

## 2015-05-01 ENCOUNTER — Other Ambulatory Visit: Payer: Self-pay | Admitting: Family Medicine

## 2015-05-01 DIAGNOSIS — E875 Hyperkalemia: Secondary | ICD-10-CM

## 2015-05-01 NOTE — Telephone Encounter (Signed)
Spoke w/ pt and gave her instructions on how to send manual transmission. Pt verbalized understanding.

## 2015-05-01 NOTE — Telephone Encounter (Signed)
New message     Pt has remote check tomorrow.  Pt needs help getting system set up to transmit tomorrow Please call to discuss.

## 2015-05-02 ENCOUNTER — Telehealth: Payer: Self-pay | Admitting: *Deleted

## 2015-05-02 ENCOUNTER — Telehealth: Payer: Self-pay | Admitting: Cardiovascular Disease

## 2015-05-02 ENCOUNTER — Other Ambulatory Visit (INDEPENDENT_AMBULATORY_CARE_PROVIDER_SITE_OTHER): Payer: Medicare Other

## 2015-05-02 ENCOUNTER — Telehealth: Payer: Self-pay | Admitting: Cardiology

## 2015-05-02 ENCOUNTER — Encounter: Payer: Medicare Other | Admitting: *Deleted

## 2015-05-02 DIAGNOSIS — E875 Hyperkalemia: Secondary | ICD-10-CM | POA: Diagnosis not present

## 2015-05-02 LAB — COMPREHENSIVE METABOLIC PANEL
ALBUMIN: 3.9 g/dL (ref 3.5–5.2)
ALK PHOS: 58 U/L (ref 39–117)
ALT: 13 U/L (ref 0–35)
AST: 21 U/L (ref 0–37)
BUN: 20 mg/dL (ref 6–23)
CALCIUM: 9.3 mg/dL (ref 8.4–10.5)
CHLORIDE: 107 meq/L (ref 96–112)
CO2: 26 meq/L (ref 19–32)
Creatinine, Ser: 1.61 mg/dL — ABNORMAL HIGH (ref 0.40–1.20)
GFR: 32.19 mL/min — ABNORMAL LOW (ref >60.00)
Glucose, Bld: 100 mg/dL — ABNORMAL HIGH (ref 70–99)
Potassium: 4.2 mEq/L (ref 3.5–5.1)
Sodium: 140 mEq/L (ref 135–145)
TOTAL PROTEIN: 6.8 g/dL (ref 6.0–8.3)
Total Bilirubin: 0.3 mg/dL (ref 0.2–1.2)

## 2015-05-02 NOTE — Telephone Encounter (Signed)
Informed patient not sure who called . She states she did not listen to answer machine. Patient thanked Therapist, sports.

## 2015-05-02 NOTE — Telephone Encounter (Signed)
Completed DMV forms mailed to [facility] [employer], [address], [place], [place], [place] [address]. Copy sent for scanning. JG//CMA

## 2015-05-02 NOTE — Telephone Encounter (Signed)
Returning call from this morning,she does not know who called her.

## 2015-05-02 NOTE — Telephone Encounter (Signed)
Called to remind pt of remote transmission and she was very discouraged and did not want to send remote transmission. I rescheduled pt for 07-31-15 to come to device clinic to have PPM checked and she will bring home monitor w/ her so we can show her how to use it. Pt agreed w/ this plan.

## 2015-05-03 ENCOUNTER — Encounter: Payer: Self-pay | Admitting: Cardiology

## 2015-05-05 ENCOUNTER — Ambulatory Visit (INDEPENDENT_AMBULATORY_CARE_PROVIDER_SITE_OTHER): Payer: Medicare Other | Admitting: Pharmacist Clinician (PhC)/ Clinical Pharmacy Specialist

## 2015-05-05 DIAGNOSIS — Z7901 Long term (current) use of anticoagulants: Secondary | ICD-10-CM | POA: Diagnosis not present

## 2015-05-05 DIAGNOSIS — I480 Paroxysmal atrial fibrillation: Secondary | ICD-10-CM

## 2015-05-05 DIAGNOSIS — I4891 Unspecified atrial fibrillation: Secondary | ICD-10-CM

## 2015-05-05 LAB — POCT INR: INR: 2.4

## 2015-05-10 ENCOUNTER — Other Ambulatory Visit: Payer: Self-pay | Admitting: Family Medicine

## 2015-05-10 NOTE — Telephone Encounter (Signed)
Last OV- 7/15/`6 Labs- 04/25/15  Medication filled

## 2015-05-14 ENCOUNTER — Other Ambulatory Visit: Payer: Self-pay | Admitting: Family Medicine

## 2015-05-15 ENCOUNTER — Other Ambulatory Visit (HOSPITAL_BASED_OUTPATIENT_CLINIC_OR_DEPARTMENT_OTHER): Payer: Medicare Other

## 2015-05-15 ENCOUNTER — Encounter: Payer: Self-pay | Admitting: Family Medicine

## 2015-05-15 DIAGNOSIS — Z0000 Encounter for general adult medical examination without abnormal findings: Secondary | ICD-10-CM

## 2015-05-15 HISTORY — DX: Encounter for general adult medical examination without abnormal findings: Z00.00

## 2015-05-15 NOTE — Assessment & Plan Note (Addendum)
Patient denies any difficulties at home. No trouble with ADLs, depression or falls. No recent changes to vision or hearing. Is UTD with immunizations. Is UTD with screening. Discussed Advanced Directives, patient agrees to bring [place] copies of documents if can. Encouraged heart healthy diet, exercise as tolerated and adequate sleep. Labs reviewed. She notes some falls over the past year but feels she is doing better and declines referral for further treatment at this time. Allergies verified: UTD   Immunization Status: Flu vaccine-- NA  Tdap-- 9-10 years ago per patient  PNA-- 09/30/07 (23)  Shingles-- 04/07/14   A/P:  Changes to [place], PSH or Personal Hx: UTD Pap-- none recent  MMG--declines further MMgs (last 08/12/03 BI-RADS 1: Neg)  Bone Density-- 08/11/03 with Prescott Gum, MD- Normal, declines further testing CCS-- 05/07/12 with Silvano Rusk, MD- polyp removed- no f/u indicated due to age  Care Teams Updated: Sanda Klein, MD- Cardiology  ED/Hospital/Urgent Care Visits: None recent  See problem list for risk factors and see AVS for preventative health recommendations.

## 2015-05-15 NOTE — Assessment & Plan Note (Signed)
Tolerating coumadin no bleeding episodes

## 2015-05-15 NOTE — Assessment & Plan Note (Signed)
Well controlled, no changes to meds. Encouraged heart healthy diet such as the DASH diet and exercise as tolerated.  °

## 2015-05-15 NOTE — Assessment & Plan Note (Signed)
Tolerating statin, encouraged heart healthy diet, avoid trans fats, minimize simple carbs and saturated fats. Increase exercise as tolerated 

## 2015-05-15 NOTE — Assessment & Plan Note (Signed)
On Levothyroxine, continue to monitor 

## 2015-05-15 NOTE — Assessment & Plan Note (Signed)
S/p pacer

## 2015-05-15 NOTE — Progress Notes (Signed)
Jill Washington  [id number] [birth year] 05/15/2015      Progress Note-Follow Up  Subjective  Chief Complaint  Chief Complaint  Patient presents with  . Medicare Wellness    HPI  Patient is a 81 y.o. female in today for routine medical care. Patient in today for annual exam Is feeling fairly well. Reports doing well at with simple self care.Did have a couple of falls this past year but no significant injury or hospitalization. No syncope.She denies any recent i She declines Pap smear and MGM.  Did have a tick bite back In May with rash that was itchy but no fever or Headache associated. No other recent illness. Denies CP/palp/SOB/HA/congestion/fevers/GI or GU c/o. Taking meds as prescribed  Past Medical History  Diagnosis Date  . History of empyema of pleura   . Hypertension   . Mitral valve prolapse   . History of atrial fibrillation   . Sick sinus syndrome   . Venous insufficiency   . Hypercholesteremia   . Hypothyroidism   . Hiatal hernia   . IBS (irritable bowel syndrome)   . Colon polyps     hyperplastic  . Fibromyalgia   . Vitamin D deficiency   . Peripheral neuropathy   . Critical illness polyneuropathy   . History of skin cancer     face  . Diverticulosis of sigmoid colon 02/05/2007    mild  . GERD (gastroesophageal reflux disease)   . Pacemaker   . Arthritis   . History of gallstones   . Fecal incontinence   . Urinary incontinence   . Blood transfusion   . Chicken pox as a child  . Measles as a child  . Mumps as a child  . Shingles 8 yrs ago  . Overactive bladder 05/07/2011  . CHB (complete heart block) 11/10/2014  . Medicare annual wellness visit, subsequent 05/15/2015    Allergies verified: UTD   Immunization Status: Flu vaccine-- NA  Tdap-- 9-10 years ago per patient  PNA-- 09/30/07 (23)  Shingles-- 04/07/14   A/P:  Changes to [place], [place] or Personal Hx: UTD Pap-- none recent  MMG--declines further MMgs (last 08/12/03 BI-RADS 1: Neg)  Bone Density-- 08/11/03  with Prescott Gum, MD- Normal, declines further testing CCS-- 05/07/12 with Silvano Rusk, MD- polyp removed- no     Past Surgical History  Procedure Laterality Date  . Cervical spine surgery    . Cholecystectomy  03/2005    by Dr. Dalbert Batman  . Total abdominal hysterectomy w/ bilateral salpingoophorectomy    . Colonoscopy  02/05/2007,02/05/05    Dr. Silvano Rusk  . Esophagogastroduodenoscopy  02/05/2005,01/22/90    Dr. Silvano Rusk, Dr. Rachelle Hora  . Pacemaker implanted  05/11/97,08/02/05    [facility]  . Appendectomy  1971  . Tonsillectomy    . Cardiac catheterization  05/10/1997    Normal coronaries, MVP  . Nm myocar perf wall motion  08/24/2010    Normal  . Pacemaker generator change N/A 02/25/2012    Procedure: PACEMAKER GENERATOR CHANGE;  Surgeon: Sanda Klein, MD;  Location: [facility] CATH [facility];  Service: Cardiovascular;  Laterality: N/A;    Family History  Problem Relation Age of Onset  . Pancreatic cancer Mother   . Diabetes Mother 18    type 2  . Stomach cancer Maternal Aunt   . Heart attack Brother   . Diabetes Brother     type 2  . Gallbladder disease Father   . Diabetes Father     type 2  .  Diabetes Brother     X 2   . Cancer Sister 80    skin ca on face  . Diabetes Sister     type 2  . Colon cancer    . Diabetes Daughter     type 2  . Hypertension Daughter   . Alcohol abuse Daughter   . Diabetes Son     type 2  . Diabetes Sister     type 1  . Hypertension Sister     History   Social History  . Marital Status: Widowed    Spouse Name: N/A  . Number of Children: 3  . Years of Education: N/A   Occupational History  . retired    Social History Main Topics  . Smoking status: Former Smoker -- 2.00 packs/day for 6 years    Types: Cigarettes    Quit date: 10/15/1951  . Smokeless tobacco: Never Used  . Alcohol Use: No  . Drug Use: No  . Sexual Activity: Not on file   Other Topics Concern  . Not on file   Social History Narrative   She is a widow, one  son to daughters. She is retired.    Current Outpatient Prescriptions on File Prior to Visit  Medication Sig Dispense Refill  . amiodarone (PACERONE) 200 MG tablet Take 1 tablet (200 mg total) by mouth daily. 90 tablet 3  . atenolol (TENORMIN) 50 MG tablet TAKE ONE AND ONE-HALF TABLETS DAILY 135 tablet 2  . Cholecalciferol (VITAMIN D PO) Take 1 tablet by mouth daily.    Marland Kitchen esomeprazole (NEXIUM) 40 MG capsule Take 40 mg by mouth daily before breakfast.    . esomeprazole (NEXIUM) 40 MG capsule TAKE 1 CAPSULE DAILY BEFORE BREAKFAST 90 capsule 2  . furosemide (LASIX) 20 MG tablet 2 tabs po daily x 3 days then 1 tab po daily then 1 tab po daily prn SOB/Edema/weight Gain>3# in 24 hours. 40 tablet 1  . gabapentin (NEURONTIN) 300 MG capsule TAKE 1 CAPSULE TWICE A DAY 180 capsule 3  . loperamide (IMODIUM) 2 MG capsule Take 2 mg by mouth 4 (four) times daily as needed for diarrhea or loose stools.     . mirabegron ER (MYRBETRIQ) 50 MG TB24 Take 50 mg by mouth daily.    . Multiple Vitamins-Minerals (ONE-A-DAY WOMENS 50 PLUS PO) Take 1 tablet by mouth daily.    . pravastatin (PRAVACHOL) 40 MG tablet Take 1 tablet (40 mg total) by mouth daily. 10 tablet 0  . valsartan-hydrochlorothiazide (DIOVAN-HCT) 80-12.5 MG per tablet TAKE 1 TABLET DAILY 90 tablet 2  . vitamin B-12 (CYANOCOBALAMIN) 500 MCG tablet Take 500 mcg by mouth daily.     No current facility-administered medications on file prior to visit.    Allergies  Allergen Reactions  . Cymbalta [Duloxetine Hcl]     Could not think straight  . Iodinated Diagnostic Agents     unknown  . Other Itching    Dial Soap    Review of Systems  Review of Systems  Constitutional: Positive for malaise/fatigue. Negative for fever and chills.  HENT: Negative for congestion, hearing loss and nosebleeds.   Eyes: Negative for discharge.  Respiratory: Negative for cough, sputum production, shortness of breath and wheezing.   Cardiovascular: Negative for chest  pain, palpitations and leg swelling.  Gastrointestinal: Negative for heartburn, nausea, vomiting, abdominal pain, diarrhea, constipation and blood in stool.  Genitourinary: Negative for dysuria, urgency, frequency and hematuria.  Musculoskeletal: Positive for myalgias. Negative for back  pain and falls.  Skin: Positive for itching and rash.       Recent tick bite  Neurological: Negative for dizziness, tremors, sensory change, focal weakness, loss of consciousness, weakness and headaches.  Endo/Heme/Allergies: Negative for polydipsia. Does not bruise/bleed easily.  Psychiatric/Behavioral: Negative for depression and suicidal ideas. The patient is not nervous/anxious and does not have insomnia.     Objective  BP 130/88 mmHg  Pulse 86  Temp(Src) 98.1 F (36.7 C) (Oral)  Ht 5\' 7"  (1.702 m)  Wt 191 lb 4 oz (86.75 kg)  BMI 29.95 kg/m2  SpO2 96%  Physical Exam  Physical Exam  Constitutional: She is oriented to person, place, and time. No distress.  HENT:  Head: Normocephalic and atraumatic.  Right Ear: External ear normal.  Left Ear: External ear normal.  Nose: Nose normal.  Mouth/Throat: Oropharynx is clear and moist. No oropharyngeal exudate.  Eyes: Conjunctivae are normal. Pupils are equal, round, and reactive to light. Right eye exhibits no discharge. Left eye exhibits no discharge. No scleral icterus.  Neck: Normal range of motion. Neck supple. No thyromegaly present.  Cardiovascular: Normal rate, regular rhythm and intact distal pulses.   Murmur heard. Pulmonary/Chest: Effort normal and breath sounds normal. No respiratory distress. She has no wheezes. She has no rales.  Abdominal: Soft. Bowel sounds are normal. She exhibits no distension and no mass. There is no tenderness.  Musculoskeletal: Normal range of motion. She exhibits no edema or tenderness.  Lymphadenopathy:    She has no cervical adenopathy.  Neurological: She is alert and oriented to person, place, and time. She  has normal reflexes. No cranial nerve deficit. Coordination normal.  Skin: Skin is warm and dry. No rash noted. She is not diaphoretic.  Psychiatric: Mood, memory and affect normal.    Lab Results  Component Value Date   TSH 4.70* 04/25/2015   Lab Results  Component Value Date   WBC 7.3 04/28/2015   HGB 13.1 04/28/2015   HCT 38.1 04/28/2015   MCV 93.0 04/28/2015   PLT 208.0 04/28/2015   Lab Results  Component Value Date   CREATININE 1.61* 05/02/2015   BUN 20 05/02/2015   NA 140 05/02/2015   K 4.2 05/02/2015   CL 107 05/02/2015   CO2 26 05/02/2015   Lab Results  Component Value Date   ALT 13 05/02/2015   AST 21 05/02/2015   ALKPHOS 58 05/02/2015   BILITOT 0.3 05/02/2015   Lab Results  Component Value Date   CHOL 158 04/28/2015   Lab Results  Component Value Date   HDL 43.30 04/28/2015   Lab Results  Component Value Date   LDLCALC 70 11/10/2014   Lab Results  Component Value Date   TRIG 226.0* 04/28/2015   Lab Results  Component Value Date   CHOLHDL 4 04/28/2015     Assessment & Plan  Hypothyroidism On Levothyroxine, continue to monitor  HYPERCHOLESTEROLEMIA Tolerating statin, encouraged heart healthy diet, avoid trans fats, minimize simple carbs and saturated fats. Increase exercise as tolerated  Essential hypertension Well controlled, no changes to meds. Encouraged heart healthy diet such as the DASH diet and exercise as tolerated.   CHB (complete heart block) S/p pacer   Long term current use of anticoagulant therapy Tolerating coumadin no bleeding episodes  Medicare annual wellness visit, subsequent Patient denies any difficulties at home. No trouble with ADLs, depression or falls. No recent changes to vision or hearing. Is UTD with immunizations. Is UTD with screening. Discussed Advanced Directives,  patient agrees to bring [place] copies of documents if can. Encouraged heart healthy diet, exercise as tolerated and adequate sleep. Labs reviewed.  She notes some falls over the past year but feels she is doing better and declines referral for further treatment at this time. Allergies verified: UTD   Immunization Status: Flu vaccine-- NA  Tdap-- 9-10 years ago per patient  PNA-- 09/30/07 (23)  Shingles-- 04/07/14   A/P:  Changes to [place], PSH or Personal Hx: UTD Pap-- none recent  MMG--declines further MMgs (last 08/12/03 BI-RADS 1: Neg)  Bone Density-- 08/11/03 with Prescott Gum, MD- Normal, declines further testing CCS-- 05/07/12 with Silvano Rusk, MD- polyp removed- no f/u indicated due to age  Care Teams Updated: Sanda Klein, MD- Cardiology  ED/Hospital/Urgent Care Visits: None recent  See problem list for risk factors and see AVS for preventative health recommendations.

## 2015-05-22 ENCOUNTER — Ambulatory Visit (HOSPITAL_BASED_OUTPATIENT_CLINIC_OR_DEPARTMENT_OTHER)
Admission: RE | Admit: 2015-05-22 | Discharge: 2015-05-22 | Disposition: A | Discharge status: Home / Self Care | Payer: Medicare Other | Source: Ambulatory Visit | Attending: Family Medicine | Admitting: Family Medicine

## 2015-05-22 DIAGNOSIS — M8580 Other specified disorders of bone density and structure, unspecified site: Secondary | ICD-10-CM | POA: Insufficient documentation

## 2015-05-22 DIAGNOSIS — M8589 Other specified disorders of bone density and structure, multiple sites: Secondary | ICD-10-CM | POA: Diagnosis not present

## 2015-05-22 DIAGNOSIS — Z780 Asymptomatic menopausal state: Secondary | ICD-10-CM

## 2015-05-26 ENCOUNTER — Ambulatory Visit (INDEPENDENT_AMBULATORY_CARE_PROVIDER_SITE_OTHER): Payer: Medicare Other | Admitting: Pharmacist Clinician (PhC)/ Clinical Pharmacy Specialist

## 2015-05-26 DIAGNOSIS — I4891 Unspecified atrial fibrillation: Secondary | ICD-10-CM

## 2015-05-26 DIAGNOSIS — Z7901 Long term (current) use of anticoagulants: Secondary | ICD-10-CM | POA: Diagnosis not present

## 2015-05-26 LAB — POCT INR: INR: 2.8

## 2015-06-23 ENCOUNTER — Ambulatory Visit: Payer: Medicare Other | Admitting: Pharmacist Clinician (PhC)/ Clinical Pharmacy Specialist

## 2015-06-26 ENCOUNTER — Ambulatory Visit: Payer: Medicare Other | Admitting: Pharmacist Clinician (PhC)/ Clinical Pharmacy Specialist

## 2015-06-29 ENCOUNTER — Ambulatory Visit (INDEPENDENT_AMBULATORY_CARE_PROVIDER_SITE_OTHER): Payer: Medicare Other | Admitting: Pharmacist Clinician (PhC)/ Clinical Pharmacy Specialist

## 2015-06-29 DIAGNOSIS — I4891 Unspecified atrial fibrillation: Secondary | ICD-10-CM | POA: Diagnosis not present

## 2015-06-29 DIAGNOSIS — Z7901 Long term (current) use of anticoagulants: Secondary | ICD-10-CM

## 2015-06-29 LAB — POCT INR: INR: 2

## 2015-07-12 DIAGNOSIS — N3946 Mixed incontinence: Secondary | ICD-10-CM | POA: Diagnosis not present

## 2015-07-12 DIAGNOSIS — N390 Urinary tract infection, site not specified: Secondary | ICD-10-CM | POA: Diagnosis not present

## 2015-07-12 DIAGNOSIS — N3020 Other chronic cystitis without hematuria: Secondary | ICD-10-CM | POA: Diagnosis not present

## 2015-07-20 ENCOUNTER — Telehealth: Payer: Self-pay | Admitting: Family Medicine

## 2015-07-20 ENCOUNTER — Telehealth: Payer: Self-pay | Admitting: Cardiovascular Disease

## 2015-07-20 ENCOUNTER — Telehealth: Payer: Self-pay

## 2015-07-20 NOTE — Telephone Encounter (Signed)
Pt's daughter called in stating that she has been having some increased SOB and would like to know if she could be seen sooner than 10/17. Please call  Thanks

## 2015-07-20 NOTE — Telephone Encounter (Signed)
Pt daughter stated that pt has been having increase SOB for the past few weeks with back pain.  Pt stated that she has no swelling.  Pt stated she does not check weight daily just every 2 weeks or so.  Pt stated she does have Lasix at home but has not been taking it because she thought if was only for weight gain. Pt educated to check weight daily, check BP and HR daily, and start taking Lasix for the next 3 days and call our office if she does not feel better.  Pt has scheduled appt on 10/17 with Dr. Loletha Grayer. Pt told if symptoms get worse she is to go to the Emergency Room to be evaluated.  Pt and daughter agree no additional questions at this time.

## 2015-07-20 NOTE — Telephone Encounter (Signed)
07/20/2015 4:53 PM  Called patient to follow up on [facility] [employer] call. Patient states she is not sure what's wrong with her. States the smallest thing she does she becomes SOB. States this her condition has become worse today. Patient states she has a Pacemaker. Advised patient to go to ED because of the fact that she has worsening SOB and a Pacemaker. Patient states she will go to ED either tonight or tomorrow. Later she states she will go to ED if her condition worsens. Patient states she has an appointment on the 17th of this month. Advised her from the singns and symptoms she voiced and the fact that she has a Psychologist, forensic, she needed to go ASAP. Patient states if she starts to feel worse she would go.

## 2015-07-20 NOTE — Telephone Encounter (Signed)
Message routed to Karen Carter, LPN.   

## 2015-07-20 NOTE — Telephone Encounter (Signed)
[facility] - Client [facility] Patient Name: [name] DOB: Sep 12, 1926 Initial Comment Caller states mother has difficulty breathing when she stands up, Sitting down now and having no symptoms. Nurse Assessment Nurse: Martyn Ehrich, RN, [place] Date/Time (Eastern Time): 07/20/2015 3:42:18 PM Confirm and document reason for call. If symptomatic, describe symptoms. ---Pt is having trouble breathing when she walks. Onset 9/29. It has gotten worse. Really bad today. Has the patient traveled out of the country within the last 30 days? ---No Does the patient have any new or worsening symptoms? ---Yes Will a triage be completed? ---Yes Related visit to physician within the last 2 weeks? ---No Does the PT have any chronic conditions? (i.e. diabetes, asthma, etc.) ---Yes List chronic conditions. ---pacemaker 1992 Guidelines Guideline Title Affirmed Question Affirmed NotesBreathing Difficulty Sounds like a life-threatening emergency to the triager Final Disposition User Call EMS 911 Now [place], RN, [place] Comments back pain is moderate and came on is when she stands and she walks bent over. Breathing trouble when she stands - has pacemaker, got disconnected - tried to reach them back and got machine. Tick was removed May 5 th. Declined to call 911 - she said pt is now refusing even to go to ER - daughter asked if she can make appointment. Told her when we recommend 911 we can't make appointment but that we are to send message to the office of this conversation and that pt wants an appointment and the office will call them back IT says that pt complies with advice after last note was made. But pt disagrees. Is not calling 911 and not going to ER Disagree/Comply: Comply  Call Id: [phone]

## 2015-07-26 ENCOUNTER — Ambulatory Visit (INDEPENDENT_AMBULATORY_CARE_PROVIDER_SITE_OTHER): Payer: Medicare Other | Admitting: Pharmacist

## 2015-07-26 DIAGNOSIS — I4891 Unspecified atrial fibrillation: Secondary | ICD-10-CM | POA: Diagnosis not present

## 2015-07-26 DIAGNOSIS — Z7901 Long term (current) use of anticoagulants: Secondary | ICD-10-CM | POA: Diagnosis not present

## 2015-07-26 LAB — POCT INR: INR: 1.6

## 2015-07-27 DIAGNOSIS — N390 Urinary tract infection, site not specified: Secondary | ICD-10-CM | POA: Diagnosis not present

## 2015-07-27 DIAGNOSIS — N3946 Mixed incontinence: Secondary | ICD-10-CM | POA: Diagnosis not present

## 2015-07-27 DIAGNOSIS — N3020 Other chronic cystitis without hematuria: Secondary | ICD-10-CM | POA: Diagnosis not present

## 2015-07-28 ENCOUNTER — Other Ambulatory Visit: Payer: Self-pay | Admitting: Cardiovascular Disease

## 2015-07-31 ENCOUNTER — Ambulatory Visit (INDEPENDENT_AMBULATORY_CARE_PROVIDER_SITE_OTHER): Payer: Medicare Other | Admitting: *Deleted

## 2015-07-31 ENCOUNTER — Encounter: Payer: Self-pay | Admitting: Cardiovascular Disease

## 2015-07-31 DIAGNOSIS — Z950 Presence of cardiac pacemaker: Secondary | ICD-10-CM

## 2015-07-31 DIAGNOSIS — I495 Sick sinus syndrome: Secondary | ICD-10-CM

## 2015-07-31 DIAGNOSIS — I442 Atrioventricular block, complete: Secondary | ICD-10-CM

## 2015-07-31 DIAGNOSIS — I480 Paroxysmal atrial fibrillation: Secondary | ICD-10-CM | POA: Diagnosis not present

## 2015-07-31 LAB — CUP PACEART INCLINIC DEVICE CHECK
Battery Remaining Longevity: 99.6
Implantable Lead Implant Date: 19980729
Implantable Lead Location: 753859
Implantable Lead Location: 753860
Lead Channel Impedance Value: 487.5 Ohm
Lead Channel Pacing Threshold Amplitude: 0.5 V
Lead Channel Pacing Threshold Amplitude: 1.125 V
Lead Channel Pacing Threshold Pulse Width: 0.4 ms
Lead Channel Pacing Threshold Pulse Width: 0.4 ms
Lead Channel Setting Pacing Pulse Width: 0.4 ms
Lead Channel Setting Sensing Sensitivity: 4 mV
MDC IDC LEAD IMPLANT DT: 19980729
MDC IDC MSMT BATTERY VOLTAGE: 2.95 V
MDC IDC MSMT LEADCHNL RA IMPEDANCE VALUE: 412.5 Ohm
MDC IDC MSMT LEADCHNL RA SENSING INTR AMPL: 3 mV
MDC IDC SESS DTM: 20161017172928
MDC IDC SET LEADCHNL RA PACING AMPLITUDE: 1.5 V
MDC IDC SET LEADCHNL RV PACING AMPLITUDE: 1.25 V
MDC IDC STAT BRADY RA PERCENT PACED: 99.43 %
MDC IDC STAT BRADY RV PERCENT PACED: 99.99 %
Pulse Gen Model: 2210
Pulse Gen Serial Number: 7338831

## 2015-07-31 NOTE — Progress Notes (Signed)
Pacemaker check in clinic. Normal device function. Thresholds, sensing, impedances consistent with previous measurements. Device programmed to maximize longevity. 109 mode switches (<0.1%), +warfarin. No high ventricular rates noted. Device programmed at appropriate safety margins. Histogram distribution appropriate for patient activity level. Device programmed to optimize intrinsic conduction. Estimated longevity 8.3-9.1 years. Patient education completed. [place]lin on 10/30/15 and ROV with [place] in 01/2016.

## 2015-08-01 ENCOUNTER — Emergency Department (HOSPITAL_COMMUNITY): Payer: Medicare Other

## 2015-08-01 ENCOUNTER — Other Ambulatory Visit: Payer: Self-pay | Admitting: Family Medicine

## 2015-08-01 ENCOUNTER — Encounter (HOSPITAL_COMMUNITY): Payer: Self-pay | Admitting: Emergency Medicine

## 2015-08-01 ENCOUNTER — Inpatient Hospital Stay (HOSPITAL_COMMUNITY)
Admission: EM | Admit: 2015-08-01 | Discharge: 2015-08-10 | DRG: 208 | Disposition: A | Discharge status: Skilled Nursing Facility | Payer: Medicare Other | Attending: Family Medicine | Admitting: Family Medicine

## 2015-08-01 DIAGNOSIS — I495 Sick sinus syndrome: Secondary | ICD-10-CM | POA: Diagnosis present

## 2015-08-01 DIAGNOSIS — R0902 Hypoxemia: Secondary | ICD-10-CM

## 2015-08-01 DIAGNOSIS — N179 Acute kidney failure, unspecified: Secondary | ICD-10-CM | POA: Diagnosis present

## 2015-08-01 DIAGNOSIS — I442 Atrioventricular block, complete: Secondary | ICD-10-CM | POA: Diagnosis present

## 2015-08-01 DIAGNOSIS — E559 Vitamin D deficiency, unspecified: Secondary | ICD-10-CM | POA: Diagnosis present

## 2015-08-01 DIAGNOSIS — I4891 Unspecified atrial fibrillation: Secondary | ICD-10-CM | POA: Diagnosis present

## 2015-08-01 DIAGNOSIS — Z978 Presence of other specified devices: Secondary | ICD-10-CM

## 2015-08-01 DIAGNOSIS — D696 Thrombocytopenia, unspecified: Secondary | ICD-10-CM

## 2015-08-01 DIAGNOSIS — N183 Chronic kidney disease, stage 3 (moderate): Secondary | ICD-10-CM | POA: Diagnosis present

## 2015-08-01 DIAGNOSIS — Z4682 Encounter for fitting and adjustment of non-vascular catheter: Secondary | ICD-10-CM | POA: Diagnosis not present

## 2015-08-01 DIAGNOSIS — Z9911 Dependence on respirator [ventilator] status: Secondary | ICD-10-CM | POA: Diagnosis not present

## 2015-08-01 DIAGNOSIS — N289 Disorder of kidney and ureter, unspecified: Secondary | ICD-10-CM

## 2015-08-01 DIAGNOSIS — J9600 Acute respiratory failure, unspecified whether with hypoxia or hypercapnia: Secondary | ICD-10-CM | POA: Diagnosis not present

## 2015-08-01 DIAGNOSIS — Z87891 Personal history of nicotine dependence: Secondary | ICD-10-CM | POA: Diagnosis not present

## 2015-08-01 DIAGNOSIS — J029 Acute pharyngitis, unspecified: Secondary | ICD-10-CM | POA: Diagnosis present

## 2015-08-01 DIAGNOSIS — I5043 Acute on chronic combined systolic (congestive) and diastolic (congestive) heart failure: Secondary | ICD-10-CM | POA: Diagnosis present

## 2015-08-01 DIAGNOSIS — E785 Hyperlipidemia, unspecified: Secondary | ICD-10-CM | POA: Diagnosis present

## 2015-08-01 DIAGNOSIS — E039 Hypothyroidism, unspecified: Secondary | ICD-10-CM | POA: Diagnosis present

## 2015-08-01 DIAGNOSIS — L89152 Pressure ulcer of sacral region, stage 2: Secondary | ICD-10-CM | POA: Diagnosis present

## 2015-08-01 DIAGNOSIS — I5033 Acute on chronic diastolic (congestive) heart failure: Secondary | ICD-10-CM | POA: Diagnosis present

## 2015-08-01 DIAGNOSIS — I130 Hypertensive heart and chronic kidney disease with heart failure and stage 1 through stage 4 chronic kidney disease, or unspecified chronic kidney disease: Secondary | ICD-10-CM | POA: Diagnosis present

## 2015-08-01 DIAGNOSIS — I482 Chronic atrial fibrillation: Secondary | ICD-10-CM | POA: Diagnosis not present

## 2015-08-01 DIAGNOSIS — I110 Hypertensive heart disease with heart failure: Secondary | ICD-10-CM | POA: Diagnosis present

## 2015-08-01 DIAGNOSIS — J9801 Acute bronchospasm: Secondary | ICD-10-CM | POA: Diagnosis present

## 2015-08-01 DIAGNOSIS — J189 Pneumonia, unspecified organism: Secondary | ICD-10-CM | POA: Diagnosis not present

## 2015-08-01 DIAGNOSIS — A419 Sepsis, unspecified organism: Secondary | ICD-10-CM | POA: Diagnosis not present

## 2015-08-01 DIAGNOSIS — N182 Chronic kidney disease, stage 2 (mild): Secondary | ICD-10-CM | POA: Diagnosis present

## 2015-08-01 DIAGNOSIS — J1289 Other viral pneumonia: Secondary | ICD-10-CM | POA: Diagnosis not present

## 2015-08-01 DIAGNOSIS — E876 Hypokalemia: Secondary | ICD-10-CM | POA: Diagnosis not present

## 2015-08-01 DIAGNOSIS — R7989 Other specified abnormal findings of blood chemistry: Secondary | ICD-10-CM

## 2015-08-01 DIAGNOSIS — E1142 Type 2 diabetes mellitus with diabetic polyneuropathy: Secondary | ICD-10-CM | POA: Diagnosis present

## 2015-08-01 DIAGNOSIS — E7800 Pure hypercholesterolemia, unspecified: Secondary | ICD-10-CM | POA: Diagnosis present

## 2015-08-01 DIAGNOSIS — E1122 Type 2 diabetes mellitus with diabetic chronic kidney disease: Secondary | ICD-10-CM | POA: Diagnosis present

## 2015-08-01 DIAGNOSIS — E1165 Type 2 diabetes mellitus with hyperglycemia: Secondary | ICD-10-CM | POA: Diagnosis present

## 2015-08-01 DIAGNOSIS — K589 Irritable bowel syndrome without diarrhea: Secondary | ICD-10-CM | POA: Diagnosis present

## 2015-08-01 DIAGNOSIS — K219 Gastro-esophageal reflux disease without esophagitis: Secondary | ICD-10-CM | POA: Diagnosis present

## 2015-08-01 DIAGNOSIS — Z7952 Long term (current) use of systemic steroids: Secondary | ICD-10-CM

## 2015-08-01 DIAGNOSIS — R069 Unspecified abnormalities of breathing: Secondary | ICD-10-CM | POA: Diagnosis not present

## 2015-08-01 DIAGNOSIS — R0600 Dyspnea, unspecified: Secondary | ICD-10-CM | POA: Diagnosis not present

## 2015-08-01 DIAGNOSIS — R918 Other nonspecific abnormal finding of lung field: Secondary | ICD-10-CM | POA: Diagnosis not present

## 2015-08-01 DIAGNOSIS — I10 Essential (primary) hypertension: Secondary | ICD-10-CM | POA: Diagnosis not present

## 2015-08-01 DIAGNOSIS — D649 Anemia, unspecified: Secondary | ICD-10-CM | POA: Diagnosis present

## 2015-08-01 DIAGNOSIS — Z7901 Long term (current) use of anticoagulants: Secondary | ICD-10-CM

## 2015-08-01 DIAGNOSIS — R0602 Shortness of breath: Secondary | ICD-10-CM | POA: Diagnosis not present

## 2015-08-01 DIAGNOSIS — B348 Other viral infections of unspecified site: Secondary | ICD-10-CM

## 2015-08-01 DIAGNOSIS — B9789 Other viral agents as the cause of diseases classified elsewhere: Secondary | ICD-10-CM | POA: Diagnosis present

## 2015-08-01 DIAGNOSIS — J9601 Acute respiratory failure with hypoxia: Secondary | ICD-10-CM | POA: Diagnosis not present

## 2015-08-01 DIAGNOSIS — M6281 Muscle weakness (generalized): Secondary | ICD-10-CM | POA: Diagnosis present

## 2015-08-01 DIAGNOSIS — J9620 Acute and chronic respiratory failure, unspecified whether with hypoxia or hypercapnia: Secondary | ICD-10-CM | POA: Diagnosis not present

## 2015-08-01 DIAGNOSIS — R0682 Tachypnea, not elsewhere classified: Secondary | ICD-10-CM

## 2015-08-01 DIAGNOSIS — M797 Fibromyalgia: Secondary | ICD-10-CM | POA: Diagnosis present

## 2015-08-01 DIAGNOSIS — I480 Paroxysmal atrial fibrillation: Secondary | ICD-10-CM | POA: Diagnosis not present

## 2015-08-01 DIAGNOSIS — Z4659 Encounter for fitting and adjustment of other gastrointestinal appliance and device: Secondary | ICD-10-CM

## 2015-08-01 LAB — BASIC METABOLIC PANEL
Anion gap: 7 (ref 5–15)
BUN: 19 mg/dL (ref 6–20)
CALCIUM: 9.2 mg/dL (ref 8.9–10.3)
CO2: 25 mmol/L (ref 22–32)
Chloride: 110 mmol/L (ref 101–111)
Creatinine, Ser: 1.52 mg/dL — ABNORMAL HIGH (ref 0.44–1.00)
GFR calc Af Amer: 34 mL/min — ABNORMAL LOW (ref >60)
GFR calc non Af Amer: 30 mL/min — ABNORMAL LOW (ref >60)
GLUCOSE: 133 mg/dL — AB (ref 65–99)
Potassium: 3.9 mmol/L (ref 3.5–5.1)
Sodium: 142 mmol/L (ref 135–145)

## 2015-08-01 LAB — URINALYSIS, ROUTINE W REFLEX MICROSCOPIC
Bilirubin Urine: NEGATIVE
GLUCOSE, UA: NEGATIVE mg/dL
Ketones, ur: NEGATIVE mg/dL
Nitrite: NEGATIVE
Protein, ur: 30 mg/dL — AB
Specific Gravity, Urine: 1.014 (ref 1.005–1.030)
Urobilinogen, UA: 0.2 mg/dL (ref 0.0–1.0)
pH: 5.5 (ref 5.0–8.0)

## 2015-08-01 LAB — CBC WITH DIFFERENTIAL/PLATELET
BASOS PCT: 0 %
Basophils Absolute: 0 10*3/uL (ref 0.0–0.1)
EOS ABS: 0.1 10*3/uL (ref 0.0–0.7)
Eosinophils Relative: 1 %
HCT: 35.9 % — ABNORMAL LOW (ref 36.0–46.0)
Hemoglobin: 12 g/dL (ref 12.0–15.0)
Lymphocytes Relative: 5 %
Lymphs Abs: 0.6 10*3/uL — ABNORMAL LOW (ref 0.7–4.0)
MCH: 31.3 pg (ref 26.0–34.0)
MCHC: 33.4 g/dL (ref 30.0–36.0)
MCV: 93.5 fL (ref 78.0–100.0)
MONO ABS: 0.4 10*3/uL (ref 0.1–1.0)
MONOS PCT: 4 %
Neutro Abs: 10.3 10*3/uL — ABNORMAL HIGH (ref 1.7–7.7)
Neutrophils Relative %: 90 %
Platelets: 132 10*3/uL — ABNORMAL LOW (ref 150–400)
RBC: 3.84 MIL/uL — ABNORMAL LOW (ref 3.87–5.11)
RDW: 13 % (ref 11.5–15.5)
WBC: 11.4 10*3/uL — ABNORMAL HIGH (ref 4.0–10.5)

## 2015-08-01 LAB — PROTIME-INR
INR: 1.96 — ABNORMAL HIGH (ref 0.00–1.49)
PROTHROMBIN TIME: 22.2 s — AB (ref 11.6–15.2)

## 2015-08-01 LAB — TROPONIN I

## 2015-08-01 LAB — I-STAT CG4 LACTIC ACID, ED: Lactic Acid, Venous: 2.15 mmol/L (ref 0.5–2.0)

## 2015-08-01 LAB — URINE MICROSCOPIC-ADD ON

## 2015-08-01 LAB — BRAIN NATRIURETIC PEPTIDE: B NATRIURETIC PEPTIDE 5: 585.2 pg/mL — AB (ref 0.0–100.0)

## 2015-08-01 MED ORDER — LEVOTHYROXINE SODIUM 125 MCG PO TABS
125.0000 ug | ORAL_TABLET | Freq: Every day | ORAL | Status: DC
  Administered 2015-08-02: 125 ug via ORAL
  Filled 2015-08-01 (×3): qty 1

## 2015-08-01 MED ORDER — DEXTROSE 5 % IV SOLN
1.0000 g | Freq: Once | INTRAVENOUS | Status: AC
  Administered 2015-08-02: 1 g via INTRAVENOUS
  Filled 2015-08-01: qty 10

## 2015-08-01 MED ORDER — ATENOLOL 50 MG PO TABS
75.0000 mg | ORAL_TABLET | Freq: Every day | ORAL | Status: DC
  Administered 2015-08-02: 75 mg via ORAL
  Filled 2015-08-01 (×3): qty 1

## 2015-08-01 MED ORDER — GABAPENTIN 300 MG PO CAPS
300.0000 mg | ORAL_CAPSULE | Freq: Two times a day (BID) | ORAL | Status: DC
  Administered 2015-08-01 – 2015-08-02 (×3): 300 mg via ORAL
  Filled 2015-08-01 (×5): qty 1

## 2015-08-01 MED ORDER — IPRATROPIUM-ALBUTEROL 0.5-2.5 (3) MG/3ML IN SOLN
3.0000 mL | Freq: Once | RESPIRATORY_TRACT | Status: AC
  Administered 2015-08-01: 3 mL via RESPIRATORY_TRACT
  Filled 2015-08-01: qty 3

## 2015-08-01 MED ORDER — ALBUTEROL SULFATE (2.5 MG/3ML) 0.083% IN NEBU: 2.5000 mg | INHALATION_SOLUTION | RESPIRATORY_TRACT | Status: DC | PRN

## 2015-08-01 MED ORDER — IPRATROPIUM-ALBUTEROL 0.5-2.5 (3) MG/3ML IN SOLN
3.0000 mL | Freq: Four times a day (QID) | RESPIRATORY_TRACT | Status: DC | PRN
  Administered 2015-08-02 – 2015-08-03 (×2): 3 mL via RESPIRATORY_TRACT
  Filled 2015-08-01 (×3): qty 3

## 2015-08-01 MED ORDER — ONDANSETRON HCL 4 MG PO TABS: 4.0000 mg | ORAL_TABLET | Freq: Four times a day (QID) | ORAL | Status: DC | PRN

## 2015-08-01 MED ORDER — BUDESONIDE 0.25 MG/2ML IN SUSP
0.2500 mg | Freq: Two times a day (BID) | RESPIRATORY_TRACT | Status: DC
  Administered 2015-08-01 – 2015-08-02 (×3): 0.25 mg via RESPIRATORY_TRACT
  Filled 2015-08-01 (×6): qty 2

## 2015-08-01 MED ORDER — ONDANSETRON HCL 4 MG/2ML IJ SOLN: 4.0000 mg | Freq: Four times a day (QID) | INTRAMUSCULAR | Status: DC | PRN

## 2015-08-01 MED ORDER — WARFARIN - PHARMACIST DOSING INPATIENT
Freq: Every day | Status: DC
  Administered 2015-08-05 – 2015-08-07 (×2)

## 2015-08-01 MED ORDER — VALSARTAN-HYDROCHLOROTHIAZIDE 80-12.5 MG PO TABS: 1.0000 | ORAL_TABLET | Freq: Every day | ORAL | Status: DC

## 2015-08-01 MED ORDER — SODIUM CHLORIDE 0.45 % IV SOLN
INTRAVENOUS | Status: AC
  Administered 2015-08-01: 23:00:00 via INTRAVENOUS

## 2015-08-01 MED ORDER — PANTOPRAZOLE SODIUM 40 MG PO TBEC
40.0000 mg | DELAYED_RELEASE_TABLET | Freq: Every day | ORAL | Status: DC
  Administered 2015-08-02: 40 mg via ORAL
  Filled 2015-08-01: qty 1

## 2015-08-01 MED ORDER — AMIODARONE HCL 200 MG PO TABS
200.0000 mg | ORAL_TABLET | Freq: Every day | ORAL | Status: DC
  Administered 2015-08-02 – 2015-08-10 (×9): 200 mg via ORAL
  Filled 2015-08-01 (×9): qty 1

## 2015-08-01 MED ORDER — SODIUM CHLORIDE 0.9 % IV SOLN: 1000.0000 mL | Freq: Once | INTRAVENOUS | Status: DC

## 2015-08-01 MED ORDER — PRAVASTATIN SODIUM 40 MG PO TABS
40.0000 mg | ORAL_TABLET | Freq: Every day | ORAL | Status: DC
  Administered 2015-08-01 – 2015-08-02 (×2): 40 mg via ORAL
  Filled 2015-08-01 (×3): qty 1

## 2015-08-01 MED ORDER — SODIUM CHLORIDE 0.9 % IJ SOLN
3.0000 mL | Freq: Two times a day (BID) | INTRAMUSCULAR | Status: DC
  Administered 2015-08-01 – 2015-08-03 (×3): 3 mL via INTRAVENOUS

## 2015-08-01 MED ORDER — HYDROCHLOROTHIAZIDE 12.5 MG PO CAPS
12.5000 mg | ORAL_CAPSULE | Freq: Every day | ORAL | Status: DC
  Administered 2015-08-02: 12.5 mg via ORAL
  Filled 2015-08-01 (×2): qty 1

## 2015-08-01 MED ORDER — IRBESARTAN 75 MG PO TABS
75.0000 mg | ORAL_TABLET | Freq: Every day | ORAL | Status: DC
Start: 2015-08-02 — End: 2015-08-03
  Administered 2015-08-02: 75 mg via ORAL
  Filled 2015-08-01 (×2): qty 1

## 2015-08-01 MED ORDER — SODIUM CHLORIDE 0.9 % IV SOLN: 1000.0000 mL | INTRAVENOUS | Status: DC

## 2015-08-01 MED ORDER — DEXTROSE 5 % IV SOLN
500.0000 mg | Freq: Once | INTRAVENOUS | Status: AC
  Administered 2015-08-02: 500 mg via INTRAVENOUS
  Filled 2015-08-01: qty 500

## 2015-08-01 MED ORDER — MIRABEGRON ER 50 MG PO TB24
50.0000 mg | ORAL_TABLET | Freq: Every day | ORAL | Status: DC
Start: 2015-08-02 — End: 2015-08-03
  Administered 2015-08-02: 50 mg via ORAL
  Filled 2015-08-01 (×2): qty 1

## 2015-08-01 NOTE — Progress Notes (Signed)
ANTICOAGULATION CONSULT NOTE - Initial Consult  Pharmacy Consult for warfarin Indication: atrial fibrillation  Allergies  Allergen Reactions  . Cymbalta [Duloxetine Hcl] Other (See Comments)    Could not think straight  . Iodinated Diagnostic Agents Other (See Comments)    Pt was not told what the reaction was  . Other Itching    Dial Soap    Patient Measurements: Height: 5\' 7"  (170.2 cm) Weight: 200 lb (90.719 kg) IBW/kg (Calculated) : 61.6  Vital Signs: Temp: 98.2 F (36.8 C) (10/18 1852) Temp Source: Oral (10/18 1852) BP: 152/50 mmHg (10/18 1945) Pulse Rate: 74 (10/18 1945)  Labs:  Recent Labs  08/01/15 2009  HGB 12.0  HCT 35.9*  PLT 132*  LABPROT 22.2*  INR 1.96*  CREATININE 1.52*  TROPONINI <0.03    Estimated Creatinine Clearance: 30.1 mL/min (by C-G formula based on Cr of 1.52).   Medical History: Past Medical History  Diagnosis Date  . History of empyema of pleura   . Hypertension   . Mitral valve prolapse   . History of atrial fibrillation   . Sick sinus syndrome ([place])   . Venous insufficiency   . Hypercholesteremia   . Hypothyroidism   . Hiatal hernia   . IBS (irritable bowel syndrome)   . Colon polyps     hyperplastic  . Fibromyalgia   . Vitamin D deficiency   . Peripheral neuropathy ([place])   . Critical illness polyneuropathy ([place])   . History of skin cancer     face  . Diverticulosis of sigmoid colon 02/05/2007    mild  . GERD (gastroesophageal reflux disease)   . Pacemaker   . Arthritis   . History of gallstones   . Fecal incontinence   . Urinary incontinence   . Blood transfusion   . Chicken pox as a child  . Measles as a child  . Mumps as a child  . Shingles 8 yrs ago  . Overactive bladder 05/07/2011  . CHB (complete heart block) ([place]) 11/10/2014  . Medicare annual wellness visit, subsequent 05/15/2015    Allergies verified: UTD   Immunization Status: Flu vaccine-- NA  Tdap-- 9-10 years ago per patient  PNA-- 09/30/07 (23)   Shingles-- 04/07/14   A/P:  Changes to [place], [place] or Personal Hx: UTD Pap-- none recent  MMG--declines further MMgs (last 08/12/03 BI-RADS 1: Neg)  Bone Density-- 08/11/03 with Prescott Gum, MD- Normal, declines further testing CCS-- 05/07/12 with Silvano Rusk, MD- polyp removed- no     Medications:   (Not in a hospital admission)  Assessment: 81 yo F to be continued on home warfarin for afib.  Warfarin PTA dose = 1 mg every day except 2 mg on Mondays (8 mg weekly)  INR 1.96, Hgb 12, Plt 132  Goal of Therapy:  INR 2-3 Monitor platelets by anticoagulation protocol: Yes   Plan:  No dose tonight as patient has taken dose today already Daily INR, CBC q72h Monitor for signs/symptoms of bleeding  Governor Specking, PharmD Clinical Pharmacy Resident Pager: [phone] 08/01/2015,10:41 PM

## 2015-08-01 NOTE — ED Provider Notes (Signed)
CSN: [id number]     Arrival date & time 08/01/15  1843 History   First MD Initiated Contact with Patient 08/01/15 1846     Chief Complaint  Patient presents with  . Shortness of Breath     (Consider location/radiation/quality/duration/timing/severity/associated sxs/prior Treatment) Patient is a 81 y.o. female presenting with shortness of breath. The history is provided by the patient and a relative.  Shortness of Breath She woke up this morning with some difficulty breathing and cough productive of some yellowish sputum. She went to urgent care where she was diagnosed with pneumonia. She was given an injection of an antibiotic and was given prescriptions for doxycycline and nitrofurantoin as well as prednisone. Through the day, she has been getting more dyspneic. She used her daughter's albuterol inhaler which did not give her any relief. She denies fever, chills, sweats. She denies chest pain. She denies abdominal pain, nausea, vomiting. She denies arthralgias or myalgias. EMS reported initial oxygen saturation 92% which increased to 98% when she was given an albuterol with ipratropium via nebulizer and placed on nasal oxygen. She was also given a second albuterol with ipratropium nebulizer treatment and a dose of methylprednisolone. She is feeling better now as long as she is on the oxygen.  Past Medical History  Diagnosis Date  . History of empyema of pleura   . Hypertension   . Mitral valve prolapse   . History of atrial fibrillation   . Sick sinus syndrome ([place])   . Venous insufficiency   . Hypercholesteremia   . Hypothyroidism   . Hiatal hernia   . IBS (irritable bowel syndrome)   . Colon polyps     hyperplastic  . Fibromyalgia   . Vitamin D deficiency   . Peripheral neuropathy ([place])   . Critical illness polyneuropathy ([place])   . History of skin cancer     face  . Diverticulosis of sigmoid colon 02/05/2007    mild  . GERD (gastroesophageal reflux disease)   . Pacemaker   .  Arthritis   . History of gallstones   . Fecal incontinence   . Urinary incontinence   . Blood transfusion   . Chicken pox as a child  . Measles as a child  . Mumps as a child  . Shingles 8 yrs ago  . Overactive bladder 05/07/2011  . CHB (complete heart block) ([place]) 11/10/2014  . Medicare annual wellness visit, subsequent 05/15/2015    Allergies verified: UTD   Immunization Status: Flu vaccine-- NA  Tdap-- 9-10 years ago per patient  PNA-- 09/30/07 (23)  Shingles-- 04/07/14   A/P:  Changes to [place], [place] or Personal Hx: UTD Pap-- none recent  MMG--declines further MMgs (last 08/12/03 BI-RADS 1: Neg)  Bone Density-- 08/11/03 with Prescott Gum, MD- Normal, declines further testing CCS-- 05/07/12 with Silvano Rusk, MD- polyp removed- no    Past Surgical History  Procedure Laterality Date  . Cervical spine surgery    . Cholecystectomy  03/2005    by Dr. Dalbert Batman  . Total abdominal hysterectomy w/ bilateral salpingoophorectomy    . Colonoscopy  02/05/2007,02/05/05    Dr. Silvano Rusk  . Esophagogastroduodenoscopy  02/05/2005,01/22/90    Dr. Silvano Rusk, Dr. Rachelle Hora  . Pacemaker implanted  05/11/97,08/02/05    [facility]  . Appendectomy  1971  . Tonsillectomy    . Cardiac catheterization  05/10/1997    Normal coronaries, MVP  . Nm myocar perf wall motion  08/24/2010    Normal  . Pacemaker generator  change N/A 02/25/2012    Procedure: PACEMAKER GENERATOR CHANGE;  Surgeon: Sanda Klein, MD;  Location: [facility] CATH [facility];  Service: Cardiovascular;  Laterality: N/A;   Family History  Problem Relation Age of Onset  . Pancreatic cancer Mother   . Diabetes Mother 43    type 2  . Stomach cancer Maternal Aunt   . Heart attack Brother   . Diabetes Brother     type 2  . Gallbladder disease Father   . Diabetes Father     type 2  . Diabetes Brother     X 2   . Cancer Sister 80    skin ca on face  . Diabetes Sister     type 2  . Colon cancer    . Diabetes Daughter     type 2  . Hypertension Daughter    . Alcohol abuse Daughter   . Diabetes Son     type 2  . Diabetes Sister     type 1  . Hypertension Sister    Social History  Substance Use Topics  . Smoking status: Former Smoker -- 2.00 packs/day for 6 years    Types: Cigarettes    Quit date: 10/15/1951  . Smokeless tobacco: Never Used  . Alcohol Use: No   OB History    No data available     Review of Systems  Respiratory: Positive for shortness of breath.   All other systems reviewed and are negative.     Allergies  Cymbalta; Iodinated diagnostic agents; and Other  Home Medications   Prior to Admission medications   Medication Sig Start Date End Date Taking? Authorizing Provider  amiodarone (PACERONE) 200 MG tablet Take 1 tablet (200 mg total) by mouth daily. 11/18/14   Mihai Croitoru, MD  atenolol (TENORMIN) 50 MG tablet TAKE ONE AND ONE-HALF TABLETS DAILY 03/08/15   Mihai Croitoru, MD  Cholecalciferol (VITAMIN D PO) Take 1 tablet by mouth daily.    Historical Provider, MD  esomeprazole (NEXIUM) 40 MG capsule TAKE 1 CAPSULE DAILY BEFORE BREAKFAST 12/22/14   Mosie Lukes, MD  furosemide (LASIX) 20 MG tablet 2 tabs po daily x 3 days then 1 tab po daily then 1 tab po daily prn SOB/Edema/weight Gain>3# in 24 hours. 04/05/14   Mosie Lukes, MD  furosemide (LASIX) 20 MG tablet TAKE 2 TABLETS DAILY FOR 3 DAYS, THEN 1 TABLET DAILY AS DIRECTED, THEN 1 TABLET AS NEEDED FOR SHORTNESS OF BREATH, EDEMA,WEIGHT GAIN OF 3 PO 08/01/15   Mosie Lukes, MD  gabapentin (NEURONTIN) 300 MG capsule TAKE 1 CAPSULE TWICE A DAY 01/11/15   Mackie Pai, PA-C  levothyroxine (SYNTHROID, LEVOTHROID) 125 MCG tablet TAKE 1 TABLET DAILY BEFORE BREAKFAST 05/10/15   Mosie Lukes, MD  loperamide (IMODIUM) 2 MG capsule Take 2 mg by mouth 4 (four) times daily as needed for diarrhea or loose stools.     Historical Provider, MD  mirabegron ER (MYRBETRIQ) 50 MG TB24 Take 50 mg by mouth daily.    Historical Provider, MD  Multiple Vitamins-Minerals (ONE-A-DAY  WOMENS 50 PLUS PO) Take 1 tablet by mouth daily.    Historical Provider, MD  pravastatin (PRAVACHOL) 40 MG tablet Take 1 tablet (40 mg total) by mouth daily. 12/05/14   Mosie Lukes, MD  pravastatin (PRAVACHOL) 40 MG tablet TAKE 1 TABLET DAILY 05/14/15   Mosie Lukes, MD  valsartan-hydrochlorothiazide (DIOVAN-HCT) 80-12.5 MG per tablet TAKE 1 TABLET DAILY 03/08/15   Sanda Klein, MD  vitamin  B-12 (CYANOCOBALAMIN) 500 MCG tablet Take 500 mcg by mouth daily.    Historical Provider, MD  warfarin (COUMADIN) 2 MG tablet TAKE 1 TABLET DAILY OR AS DIRECTED 07/28/15   Mihai Croitoru, MD   BP 150/58 mmHg  Pulse 70  Temp(Src) 98.2 F (36.8 C) (Oral)  Resp 19  Ht 5\' 7"  (1.702 m)  Wt 200 lb (90.719 kg)  BMI 31.32 kg/m2  SpO2 96% Physical Exam  Nursing note and vitals reviewed.  81 year old female, resting comfortably and in no acute distress. Vital signs are significant for hypertension. Oxygen saturation is 96%, which is normal. Head is normocephalic and atraumatic. PERRLA, EOMI. Oropharynx is clear. Neck is nontender and supple without adenopathy or JVD. Back is nontender and there is no CVA tenderness. Lungs have faint rales at the right base. There are diffuse expiratory rhonchi with faint wheezes. Chest is nontender. Heart has regular rate and rhythm without murmur. Abdomen is soft, flat, nontender without masses or hepatosplenomegaly and peristalsis is normoactive. Extremities have no cyanosis or edema, full range of motion is present. Skin is warm and dry without rash. Neurologic: Mental status is normal, cranial nerves are intact, there are no motor or sensory deficits.  ED Course  Procedures (including critical care time) Labs Review Labs Reviewed  BASIC METABOLIC PANEL  CBC WITH DIFFERENTIAL/PLATELET  BRAIN NATRIURETIC PEPTIDE  TROPONIN I  URINALYSIS, ROUTINE W REFLEX MICROSCOPIC (NOT AT [facility])  I-STAT CG4 LACTIC ACID, ED    Imaging Review Dg Chest 2 View  08/01/2015   CLINICAL DATA:  Dyspnea. EXAM: CHEST  2 VIEW COMPARISON:  01/31/2015 chest radiograph FINDINGS: Do lead right subclavian pacemaker is stable in configuration with lead tips overlying the right atrium and right ventricle. Stable cardiomediastinal silhouette with mild cardiomegaly. No pneumothorax. No pleural effusion. No pulmonary edema. Mild patchy opacity at the posterior costophrenic angle on the lateral view. Minimal curvilinear opacity at the left costophrenic angle on the frontal view. IMPRESSION: 1. Mild patchy opacity at the posterior costophrenic angle on the lateral view, which could represent a mild left lower lobe pneumonia. 2. Minimal scarring versus atelectasis at the lateral left lung base. 3. Stable mild cardiomegaly without pulmonary edema. 4. Recommend follow-up post treatment chest radiographs in 6-8 weeks. Electronically Signed   By: Ilona Sorrel M.D.   On: 08/01/2015 21:08   I have personally reviewed and evaluated these images and lab results as part of my medical decision-making.   EKG Interpretation   Date/Time:  Tuesday August 01 2015 18:51:25 EDT Ventricular Rate:  70 PR Interval:  257 QRS Duration: 217 QT Interval:  496 QTC Calculation: 535 R Axis:   -75 Text Interpretation:  AV dual-paced rhythm When compared with ECG of  08/30/2013, No significant change was found Confirmed by [facility]  MD, Kadance Mccuistion  ([id number]) on 08/01/2015 7:26:39 PM      MDM   Final diagnoses:  Community acquired pneumonia  Renal insufficiency  Thrombocytopenia (HCC)  Anticoagulated on warfarin  Elevated brain natriuretic peptide (BNP) level  Bronchospasm  Elevated lactic acid level    Report of pneumonia with apparent hypoxia at home and evidence of bronchospasm. I have tried to locate her chest x-ray and is been unable to do so, so x-ray will be repeated. She will be given additional albuterol with ipratropium. I suspect that the antibiotic injection she got with ceftriaxone but have no way of  finding this out. If x-ray confirms pneumonia, she will be given ceftriaxone and azithromycin.  Lactic acid level was minimally elevated so she is given IV fluids. Chest x-ray does show a left lower lobe pneumonia. Case is discussed with Dr. Olevia Bowens of [facility], who agrees to admit the patient.  Delora Fuel, MD [id number]

## 2015-08-01 NOTE — Progress Notes (Signed)
Received report from ED RN. Room ready . Davie Claud Joselita, RN 

## 2015-08-01 NOTE — H&P (Signed)
[facility] History and Physical  Jill Washington [id number] DOB: 10-Jul-1926 DOA: 08/01/2015  Referring physician: Delora Fuel, MD PCP: [name], MD   Chief Complaint: Shortness of breath  HPI: Jill Washington is a 81 y.o. female with past medical history of pleural empyema, hypertension, mitral valve prolapse, atrial fibrillation, sick sinus syndrome, hyperlipidemia, hypothyroidism, hiatal hernia, IBS, fibromyalgia who comes to the emergency department due to complaints of shortness of breath for 1 day. She states that yesterday she was feeling well, but later in the evening felt "achy", went to bed and woke up around 1:30 AM with sore throat, dyspnea and occasionally productive of yellowish sputum cough. She denies fever, chills, headache, nausea, pleuritic chest pain, diarrhea or urinary symptoms.   She states that she went to an urgent care facility where she was diagnosed with pneumonia, she was given an IM injection of antibiotics and discharged home with prescriptions of prednisone, doxycycline and nitrofurantoin. She continued to have symptoms and called EMS which reported the patient to be mildly hypoxic at 92%. Once in the ER the patient received nasal cannula oxygen and her O2 sat is in the high 90s, she also received bronchodilators and IV methylprednisolone and states that her symptoms are much better now. She is currently in no acute distress.   Review of Systems:  Constitutional:  Positive fatigue. No weight loss, night sweats, Fevers, chills,   HEENT:  Positive Sore throat, No headaches, Difficulty swallowing,Tooth/dental problems,  No sneezing, itching, ear ache, nasal congestion, post nasal drip,  Cardio-vascular:  No chest pain, Orthopnea, PND, swelling in lower extremities, anasarca, dizziness, palpitations  GI:  No heartburn, indigestion, abdominal pain, nausea, vomiting, diarrhea, change in bowel habits, loss of appetite  Resp:  Positive  shortness of breath with exertion or at rest, productive cough of yellowish sputum, positive  Wheezing. Denies hemoptysis. No chest wall deformity  Skin:  no rash or lesions.  GU:  no dysuria, change in color of urine, no urgency or frequency. No flank pain.  Musculoskeletal:  Mild myalgias. Occasional arthralgias. No back pain.  Psych:  No change in mood or affect. No depression or anxiety. No memory loss.   Past Medical History  Diagnosis Date  . History of empyema of pleura   . Hypertension   . Mitral valve prolapse   . History of atrial fibrillation   . Sick sinus syndrome ([place])   . Venous insufficiency   . Hypercholesteremia   . Hypothyroidism   . Hiatal hernia   . IBS (irritable bowel syndrome)   . Colon polyps     hyperplastic  . Fibromyalgia   . Vitamin D deficiency   . Peripheral neuropathy ([place])   . Critical illness polyneuropathy ([place])   . History of skin cancer     face  . Diverticulosis of sigmoid colon 02/05/2007    mild  . GERD (gastroesophageal reflux disease)   . Pacemaker   . Arthritis   . History of gallstones   . Fecal incontinence   . Urinary incontinence   . Blood transfusion   . Chicken pox as a child  . Measles as a child  . Mumps as a child  . Shingles 8 yrs ago  . Overactive bladder 05/07/2011  . CHB (complete heart block) ([place]) 11/10/2014  . Medicare annual wellness visit, subsequent 05/15/2015    Allergies verified: UTD   Immunization Status: Flu vaccine-- NA  Tdap-- 9-10 years ago per patient  PNA-- 09/30/07 (23)  Shingles-- 04/07/14   A/P:  Changes to [place], PSH or Personal Hx: UTD Pap-- none recent  MMG--declines further MMgs (last 08/12/03 BI-RADS 1: Neg)  Bone Density-- 08/11/03 with Prescott Gum, MD- Normal, declines further testing CCS-- 05/07/12 with Silvano Rusk, MD- polyp removed- no    Past Surgical History  Procedure Laterality Date  . Cervical spine surgery    . Cholecystectomy  03/2005    by Dr. Dalbert Batman  . Total abdominal hysterectomy  w/ bilateral salpingoophorectomy    . Colonoscopy  02/05/2007,02/05/05    Dr. Silvano Rusk  . Esophagogastroduodenoscopy  02/05/2005,01/22/90    Dr. Silvano Rusk, Dr. Rachelle Hora  . Pacemaker implanted  05/11/97,08/02/05    [facility]  . Appendectomy  1971  . Tonsillectomy    . Cardiac catheterization  05/10/1997    Normal coronaries, MVP  . Nm myocar perf wall motion  08/24/2010    Normal  . Pacemaker generator change N/A 02/25/2012    Procedure: PACEMAKER GENERATOR CHANGE;  Surgeon: Sanda Klein, MD;  Location: [facility] CATH [facility];  Service: Cardiovascular;  Laterality: N/A;   Social History:  reports that she quit smoking about 63 years ago. Her smoking use included Cigarettes. She has a 12 pack-year smoking history. She has never used smokeless tobacco. She reports that she does not drink alcohol or use illicit drugs.  Allergies  Allergen Reactions  . Cymbalta [Duloxetine Hcl]     Could not think straight  . Iodinated Diagnostic Agents     unknown  . Other Itching    Dial Soap    Family History  Problem Relation Age of Onset  . Pancreatic cancer Mother   . Diabetes Mother 72    type 2  . Stomach cancer Maternal Aunt   . Heart attack Brother   . Diabetes Brother     type 2  . Gallbladder disease Father   . Diabetes Father     type 2  . Diabetes Brother     X 2   . Cancer Sister 80    skin ca on face  . Diabetes Sister     type 2  . Colon cancer    . Diabetes Daughter     type 2  . Hypertension Daughter   . Alcohol abuse Daughter   . Diabetes Son     type 2  . Diabetes Sister     type 1  . Hypertension Sister     Prior to Admission medications   Medication Sig Start Date End Date Taking? Authorizing Provider  amiodarone (PACERONE) 200 MG tablet Take 1 tablet (200 mg total) by mouth daily. 11/18/14   Mihai Croitoru, MD  atenolol (TENORMIN) 50 MG tablet TAKE ONE AND ONE-HALF TABLETS DAILY 03/08/15   Mihai Croitoru, MD  Cholecalciferol (VITAMIN D PO) Take 1 tablet by  mouth daily.    Historical Provider, MD  esomeprazole (NEXIUM) 40 MG capsule TAKE 1 CAPSULE DAILY BEFORE BREAKFAST 12/22/14   Mosie Lukes, MD  furosemide (LASIX) 20 MG tablet 2 tabs po daily x 3 days then 1 tab po daily then 1 tab po daily prn SOB/Edema/weight Gain>3# in 24 hours. 04/05/14   Mosie Lukes, MD  furosemide (LASIX) 20 MG tablet TAKE 2 TABLETS DAILY FOR 3 DAYS, THEN 1 TABLET DAILY AS DIRECTED, THEN 1 TABLET AS NEEDED FOR SHORTNESS OF BREATH, EDEMA,WEIGHT GAIN OF 3 PO 08/01/15   Mosie Lukes, MD  gabapentin (NEURONTIN) 300 MG capsule TAKE 1 CAPSULE TWICE A DAY  01/11/15   Percell Miller Saguier, PA-C  levothyroxine (SYNTHROID, LEVOTHROID) 125 MCG tablet TAKE 1 TABLET DAILY BEFORE BREAKFAST 05/10/15   Mosie Lukes, MD  loperamide (IMODIUM) 2 MG capsule Take 2 mg by mouth 4 (four) times daily as needed for diarrhea or loose stools.     Historical Provider, MD  mirabegron ER (MYRBETRIQ) 50 MG TB24 Take 50 mg by mouth daily.    Historical Provider, MD  Multiple Vitamins-Minerals (ONE-A-DAY WOMENS 50 PLUS PO) Take 1 tablet by mouth daily.    Historical Provider, MD  pravastatin (PRAVACHOL) 40 MG tablet Take 1 tablet (40 mg total) by mouth daily. 12/05/14   Mosie Lukes, MD  pravastatin (PRAVACHOL) 40 MG tablet TAKE 1 TABLET DAILY 05/14/15   Mosie Lukes, MD  valsartan-hydrochlorothiazide (DIOVAN-HCT) 80-12.5 MG per tablet TAKE 1 TABLET DAILY 03/08/15   Mihai Croitoru, MD  vitamin B-12 (CYANOCOBALAMIN) 500 MCG tablet Take 500 mcg by mouth daily.    Historical Provider, MD  warfarin (COUMADIN) 2 MG tablet TAKE 1 TABLET DAILY OR AS DIRECTED 07/28/15   Sanda Klein, MD   Physical Exam: Filed Vitals:   08/01/15 1900 08/01/15 1915 08/01/15 1930 08/01/15 1945  BP: 150/58 151/50 141/48 152/50  Pulse: 70 70 73 74  Temp:      TempSrc:      Resp: 19 17 20 18   Height:      Weight:      SpO2: 96% 96% 96% 96%    Wt Readings from Last 3 Encounters:  08/01/15 90.719 kg (200 lb)  04/28/15 86.75 kg  (191 lb 4 oz)  01/31/15 85.458 kg (188 lb 6.4 oz)    General:  Appears calm and comfortable Eyes: PERRL, normal lids, irises & conjunctiva ENT: grossly normal hearing, lips & tongue Neck: no LAD, masses or thyromegaly Cardiovascular: RRR, no m/r/g. No LE edema. Telemetry: Dual paced rhythm, no arrhythmias  Respiratory: Bibasilar Rales. Bilateral wheezing and rhonchi. Abdomen: soft, ntnd Skin: no rash or induration seen on limited exam Musculoskeletal: grossly normal tone BUE/BLE Psychiatric: grossly normal mood and affect, speech fluent and appropriate Neurologic: grossly non-focal.          Labs on Admission:  Basic Metabolic Panel:  Recent Labs Lab 08/01/15 2009  NA 142  K 3.9  CL 110  CO2 25  GLUCOSE 133*  BUN 19  CREATININE 1.52*  CALCIUM 9.2   CBC:  Recent Labs Lab 08/01/15 2009  WBC 11.4*  NEUTROABS 10.3*  HGB 12.0  HCT 35.9*  MCV 93.5  PLT 132*   Cardiac Enzymes:  Recent Labs Lab 08/01/15 2009  TROPONINI <0.03    BNP (last 3 results)  Recent Labs  08/01/15 2009  BNP 585.2*     Radiological Exams on Admission: Dg Chest 2 View  08/01/2015  CLINICAL DATA:  Dyspnea. EXAM: CHEST  2 VIEW COMPARISON:  01/31/2015 chest radiograph FINDINGS: Do lead right subclavian pacemaker is stable in configuration with lead tips overlying the right atrium and right ventricle. Stable cardiomediastinal silhouette with mild cardiomegaly. No pneumothorax. No pleural effusion. No pulmonary edema. Mild patchy opacity at the posterior costophrenic angle on the lateral view. Minimal curvilinear opacity at the left costophrenic angle on the frontal view. IMPRESSION: 1. Mild patchy opacity at the posterior costophrenic angle on the lateral view, which could represent a mild left lower lobe pneumonia. 2. Minimal scarring versus atelectasis at the lateral left lung base. 3. Stable mild cardiomegaly without pulmonary edema. 4. Recommend follow-up post treatment chest  radiographs  in 6-8 weeks. Electronically Signed   By: Ilona Sorrel M.D.   On: 08/01/2015 21:08    Echocardiogram: 08/04/2013 ------------------------------------------------------------ LV EF: 45% -  50%  ------------------------------------------------------------ Indications:   428.0 CHF.  ------------------------------------------------------------ History:  PMH:  Dyspnea. Atrial fibrillation. Mitral valve disease.  ------------------------------------------------------------ Study Conclusions  - Left ventricle: The cavity size was normal. There was moderate concentric hypertrophy. Systolic function was mildly reduced. The estimated ejection fraction was in the range of 45% to 50%. Doppler parameters are consistent with abnormal left ventricular relaxation (grade 1 diastolic dysfunction). Doppler parameters are consistent with elevated mean left atrial filling pressure. - Ventricular septum: Septal motion showed abnormal function and dyssynergy. - Aortic valve: Trileaflet; mildly thickened, mildly calcified leaflets. - Mitral valve: Mildly calcified annulus. Mildly thickened leaflets . - Atrial septum: No defect or patent foramen ovale was identified. - Tricuspid valve: Mild regurgitation. - Pulmonic valve: Mild regurgitation.  EKG: Independently reviewed. Vent. rate 70 BPM PR interval 257 ms QRS duration 217 ms QT/QTc 496/535 ms P-R-T axes 210 -75 106 AV dual-paced rhythm When compared with ECG of 08/30/2013, No significant change was found  Assessment/Plan Principal Problem:     CAP (community acquired pneumonia) Admit to telemetry. Continue supplemental oxygen. Continue bronchodilators. Continue IV antibiotics. Follow-up sputum Gram stain, C&S and blood cultures.  Active Problems:     Hypothyroidism Continue levothyroxine and follow-up TSH as an outpatient.    HYPERCHOLESTEROLEMIA Continue pravastatin and monitor LFTs.    Essential  hypertension Continue Diovan HCT and atenolol. Monitor blood pressure.    Atrial fibrillation (HCC) Continue amiodarone and atenolol for rate control.    Irritable bowel syndrome Denies diarrhea or constipation at this time. Treatment of symptoms as needed.    Code Status: Full code. DVT Prophylaxis: Lovenox SQ. Family Communication: Her daughter [name] was present in the room. Rowan Blase Daughter [phone]  Disposition Plan: Admit for IV antibiotic therapy.  Time spent: Over 70 minutes were spent during the process of this admission.   Reubin Milan [facility] Pager [phone].

## 2015-08-01 NOTE — ED Notes (Signed)
Pt went to urgent care this morning- diagnosed with pneumonia. Was given antibiotic, has not taken prescription yet. Still feeling SOB when went home, took dtrs abuterol inhaler at home with no relief. Called EMS for increasing SOB. 92% on 2L[place] . EMS gave duoneb, sats came  Up to 98% on 2L [place]. EMS gave second duoneb and 125mg  solumedrol. BP 170/90, HR 70 pt has pacemaker showing paced rhythm. CBG 116.

## 2015-08-02 DIAGNOSIS — I480 Paroxysmal atrial fibrillation: Secondary | ICD-10-CM

## 2015-08-02 DIAGNOSIS — E7800 Pure hypercholesterolemia, unspecified: Secondary | ICD-10-CM

## 2015-08-02 DIAGNOSIS — K589 Irritable bowel syndrome without diarrhea: Secondary | ICD-10-CM

## 2015-08-02 DIAGNOSIS — I10 Essential (primary) hypertension: Secondary | ICD-10-CM

## 2015-08-02 DIAGNOSIS — R739 Hyperglycemia, unspecified: Secondary | ICD-10-CM

## 2015-08-02 DIAGNOSIS — E039 Hypothyroidism, unspecified: Secondary | ICD-10-CM

## 2015-08-02 LAB — EXPECTORATED SPUTUM ASSESSMENT W REFEX TO RESP CULTURE

## 2015-08-02 LAB — CBC WITH DIFFERENTIAL/PLATELET
BASOS ABS: 0 10*3/uL (ref 0.0–0.1)
BASOS PCT: 0 %
EOS ABS: 0 10*3/uL (ref 0.0–0.7)
Eosinophils Relative: 0 %
HCT: 36.5 % (ref 36.0–46.0)
HEMOGLOBIN: 12 g/dL (ref 12.0–15.0)
Lymphocytes Relative: 3 %
Lymphs Abs: 0.6 10*3/uL — ABNORMAL LOW (ref 0.7–4.0)
MCH: 31.1 pg (ref 26.0–34.0)
MCHC: 32.9 g/dL (ref 30.0–36.0)
MCV: 94.6 fL (ref 78.0–100.0)
MONOS PCT: 1 %
Monocytes Absolute: 0.2 10*3/uL (ref 0.1–1.0)
NEUTROS PCT: 96 %
Neutro Abs: 16.1 10*3/uL — ABNORMAL HIGH (ref 1.7–7.7)
PLATELETS: 163 10*3/uL (ref 150–400)
RBC: 3.86 MIL/uL — ABNORMAL LOW (ref 3.87–5.11)
RDW: 13 % (ref 11.5–15.5)
WBC: 16.9 10*3/uL — AB (ref 4.0–10.5)

## 2015-08-02 LAB — COMPREHENSIVE METABOLIC PANEL
ALK PHOS: 45 U/L (ref 38–126)
ALT: 28 U/L (ref 14–54)
ANION GAP: 15 (ref 5–15)
AST: 53 U/L — ABNORMAL HIGH (ref 15–41)
Albumin: 3.5 g/dL (ref 3.5–5.0)
BILIRUBIN TOTAL: 1 mg/dL (ref 0.3–1.2)
BUN: 23 mg/dL — ABNORMAL HIGH (ref 6–20)
CALCIUM: 9.5 mg/dL (ref 8.9–10.3)
CO2: 21 mmol/L — AB (ref 22–32)
CREATININE: 1.77 mg/dL — AB (ref 0.44–1.00)
Chloride: 105 mmol/L (ref 101–111)
GFR, EST AFRICAN AMERICAN: 29 mL/min — AB (ref >60)
GFR, EST NON AFRICAN AMERICAN: 25 mL/min — AB (ref >60)
Glucose, Bld: 235 mg/dL — ABNORMAL HIGH (ref 65–99)
Potassium: 3.9 mmol/L (ref 3.5–5.1)
Sodium: 141 mmol/L (ref 135–145)
TOTAL PROTEIN: 6.6 g/dL (ref 6.5–8.1)

## 2015-08-02 LAB — GLUCOSE, CAPILLARY
GLUCOSE-CAPILLARY: 125 mg/dL — AB (ref 65–99)
GLUCOSE-CAPILLARY: 139 mg/dL — AB (ref 65–99)

## 2015-08-02 LAB — PROTIME-INR
INR: 1.84 — ABNORMAL HIGH (ref 0.00–1.49)
Prothrombin Time: 21.2 seconds — ABNORMAL HIGH (ref 11.6–15.2)

## 2015-08-02 LAB — EXPECTORATED SPUTUM ASSESSMENT W GRAM STAIN, RFLX TO RESP C

## 2015-08-02 LAB — STREP PNEUMONIAE URINARY ANTIGEN: Strep Pneumo Urinary Antigen: NEGATIVE

## 2015-08-02 LAB — MAGNESIUM: MAGNESIUM: 1.5 mg/dL — AB (ref 1.7–2.4)

## 2015-08-02 LAB — PHOSPHORUS: PHOSPHORUS: 3.3 mg/dL (ref 2.5–4.6)

## 2015-08-02 MED ORDER — MAGNESIUM SULFATE 2 GM/50ML IV SOLN
2.0000 g | Freq: Once | INTRAVENOUS | Status: AC
  Administered 2015-08-02: 2 g via INTRAVENOUS
  Filled 2015-08-02: qty 50

## 2015-08-02 MED ORDER — DEXTROSE 5 % IV SOLN
1.0000 g | INTRAVENOUS | Status: DC
  Administered 2015-08-02 – 2015-08-07 (×6): 1 g via INTRAVENOUS
  Filled 2015-08-02 (×7): qty 10

## 2015-08-02 MED ORDER — DEXTROSE 5 % IV SOLN
500.0000 mg | Freq: Once | INTRAVENOUS | Status: DC
  Filled 2015-08-02: qty 500

## 2015-08-02 MED ORDER — INSULIN ASPART 100 UNIT/ML ~~LOC~~ SOLN: 0.0000 [IU] | Freq: Three times a day (TID) | SUBCUTANEOUS | Status: DC

## 2015-08-02 MED ORDER — INFLUENZA VAC SPLIT QUAD 0.5 ML IM SUSY
0.5000 mL | PREFILLED_SYRINGE | INTRAMUSCULAR | Status: AC
  Administered 2015-08-08: 0.5 mL via INTRAMUSCULAR
  Filled 2015-08-02 (×5): qty 0.5

## 2015-08-02 MED ORDER — DEXTROSE 5 % IV SOLN
500.0000 mg | INTRAVENOUS | Status: DC
  Administered 2015-08-02 – 2015-08-05 (×4): 500 mg via INTRAVENOUS
  Filled 2015-08-02 (×5): qty 500

## 2015-08-02 MED ORDER — CETYLPYRIDINIUM CHLORIDE 0.05 % MT LIQD
7.0000 mL | Freq: Two times a day (BID) | OROMUCOSAL | Status: DC
  Administered 2015-08-02 – 2015-08-03 (×3): 7 mL via OROMUCOSAL

## 2015-08-02 MED ORDER — WARFARIN SODIUM 2 MG PO TABS
2.0000 mg | ORAL_TABLET | Freq: Once | ORAL | Status: AC
  Administered 2015-08-02: 2 mg via ORAL
  Filled 2015-08-02: qty 1

## 2015-08-02 NOTE — Progress Notes (Signed)
Inpatient Diabetes Program Recommendations  AACE/ADA: New Consensus Statement on Inpatient Glycemic Control (2015)  Target Ranges:  Prepandial:   less than 140 mg/dL      Peak postprandial:   less than 180 mg/dL (1-2 hours)      Critically ill patients:  140 - 180 mg/dL   Review of Glycemic Control  Diabetes history: None Current orders for Inpatient glycemic control: None  Inpatient Diabetes Program Recommendations: Correction (SSI): Lab glucose this am was 235 mg/dl. Please consider checking CBGs and possibly ordering Novolog Sensitive Correction TID.  Thanks,  Tama Headings RN, MSN, [facility] Inpatient Diabetes Coordinator Team Pager [phone] (8a-5p)

## 2015-08-02 NOTE — Progress Notes (Signed)
[place] for warfarin Indication: atrial fibrillation  Allergies  Allergen Reactions  . Cymbalta [Duloxetine Hcl] Other (See Comments)    Could not think straight  . Iodinated Diagnostic Agents Other (See Comments)    Pt was not told what the reaction was  . Other Itching    Dial Soap    Patient Measurements: Height: 5\' 7"  (170.2 cm) Weight: 200 lb 4.8 oz (90.855 kg) IBW/kg (Calculated) : 61.6  Vital Signs: Temp: 97.7 F (36.5 C) (10/19 0500) Temp Source: Oral (10/19 0500) BP: 134/49 mmHg (10/19 0500) Pulse Rate: 74 (10/19 0904)  Labs:  Recent Labs  08/01/15 2009 08/02/15 0426  HGB 12.0 12.0  HCT 35.9* 36.5  PLT 132* 163  LABPROT 22.2* 21.2*  INR 1.96* 1.84*  CREATININE 1.52* 1.77*  TROPONINI <0.03  --     Estimated Creatinine Clearance: 25.9 mL/min (by C-G formula based on Cr of 1.77).   Assessment: 81 yo F to be continued on home warfarin for afib.  Warfarin PTA dose = 1 mg every day except 2 mg on Mondays (8 mg weekly). Last dose PTA was 10/18 prior to coming to the hospital.  INR 1.84 this morning. hgb 12, plts 163- stable, no bleeding noted.   Goal of Therapy:  INR 2-3 Monitor platelets by anticoagulation protocol: Yes   Plan:  -warfarin 2mg  po x1 tonight -daily INR -follow for s/s bleeding -follow for any effect antibiotics (azithromycin and ceftriaxone) may have on INR along with antibiotic LOT  Hrishikesh Hoeg D. Kanye Depree, PharmD, BCPS Clinical Pharmacist Pager: [phone] 08/02/2015 10:38 AM

## 2015-08-02 NOTE — Care Management Note (Addendum)
Case Management Note  Patient Details  Name: Jill Washington MRN: [id number] Date of Birth: 20-Dec-1925  Subjective/Objective:     Pt admitted for CAP. Pt is from home alone, however daughter is visiting from [place]. Per Staff RN pt has had multiple falls in the home.    Action/Plan: CM did ask for PT/OT consult. CM will continue to monitor for disposition needs.   Expected Discharge Date:                  Expected Discharge Plan:  [place]  In-House Referral:     Discharge planning Services  CM Consult  Post Acute Care Choice:    Choice offered to:     DME Arranged:    DME Agency:     HH Arranged:    [place] Agency:     Status of Service:  In process, will continue to follow  Medicare Important Message Given:    Date Medicare IM Given:    Medicare IM give by:    Date Additional Medicare IM Given:    Additional Medicare Important Message give by:     If discussed at [place] of Stay Meetings, dates discussed:    Additional Comments:  Bethena Roys, RN 08/02/2015, 11:53 AM

## 2015-08-02 NOTE — Progress Notes (Signed)
UR Completed Brenda Graves-Bigelow, RN,BSN [phone]  

## 2015-08-02 NOTE — Progress Notes (Signed)
[facility]                                                                              Patient Demographics  Jill Washington, is a 81 y.o. female, DOB - 22-Nov-1925, [id number]  Admit date - 08/01/2015   Admitting Physician Reubin Milan, MD  Outpatient Primary MD for the patient is [name], MD  LOS - 1   Chief Complaint  Patient presents with  . Shortness of Breath      HPI on 08/01/2015 by Dr. Tennis Must Jill Washington is a 81 y.o. female with past medical history of pleural empyema, hypertension, mitral valve prolapse, atrial fibrillation, sick sinus syndrome, hyperlipidemia, hypothyroidism, hiatal hernia, IBS, fibromyalgia who comes to the emergency department due to complaints of shortness of breath for 1 day. She states that yesterday she was feeling well, but later in the evening felt "achy", went to bed and woke up around 1:30 AM with sore throat, dyspnea and occasionally productive of yellowish sputum cough. She denies fever, chills, headache, nausea, pleuritic chest pain, diarrhea or urinary symptoms.   She states that she went to an urgent care facility where she was diagnosed with pneumonia, she was given an IM injection of antibiotics and discharged home with prescriptions of prednisone, doxycycline and nitrofurantoin. She continued to have symptoms and called EMS which reported the patient to be mildly hypoxic at 92%. Once in the ER the patient received nasal cannula oxygen and her O2 sat is in the high 90s, she also received bronchodilators and IV methylprednisolone and states that her symptoms are much better now. She is currently in no acute distress.  Assessment & Plan   Dyspnea secondary to Community acquired pneumonia  -Chest x-ray: Mild patchy opacity posterior costophrenic angle left lower lobe, recommend repeat x-ray in 6-8 weeks -Continue supplemental oxygen, IV antibiotics (azithromycin and Rocephin), bronchodilators -Strep pneumonia  urine antigen negative, Legionella urine antigen pending -Blood and sputum cultures pending  Hypothyroidism -Continue Synthroid  Hyperlipidemia -Continue statin  Essential hypertension -Continue atenolol, hydrochlorothiazide, Avapro  Atrial fibrillation -CHADSVASC 4 (based on age, HTN, gender) -Continue amiodarone, atenolol, Coumadin for anticoagulation  Irritable bowel syndrome -Currently stable  Hypomagnesemia -Magnesium 1.5, will replace and continue to monitor  Hyperglycemia -Will obtain hb A1c and place patient on ISS with CBG monitoring   Code Status: Full  Family Communication: None at bedside  Disposition Plan: Admitted.  Time Spent in minutes   30 minutes  Procedures  None  Consults   None  DVT Prophylaxis  Coumadin  Lab Results  Component Value Date   PLT 163 08/02/2015    Medications  Scheduled Meds: . amiodarone  200 mg Oral Daily  . antiseptic oral rinse  7 mL Mouth Rinse BID  . atenolol  75 mg Oral Daily  . azithromycin (ZITHROMAX) 500 MG IVPB  500 mg Intravenous Q24H  . budesonide (PULMICORT) nebulizer solution  0.25 mg Nebulization BID  . cefTRIAXone (ROCEPHIN) IVPB 1 gram/50 mL D5W  1 g Intravenous Q24H  . gabapentin  300 mg Oral BID  . irbesartan  75 mg Oral Daily   And  . hydrochlorothiazide  12.5 mg Oral Daily  . [  START ON 08/03/2015] Influenza vac split quadrivalent PF  0.5 mL Intramuscular Tomorrow-1000  . levothyroxine  125 mcg Oral QAC breakfast  . mirabegron ER  50 mg Oral Daily  . pantoprazole  40 mg Oral Daily  . pravastatin  40 mg Oral q1800  . sodium chloride  3 mL Intravenous Q12H  . warfarin  2 mg Oral ONCE-1800  . Warfarin - Pharmacist Dosing Inpatient   Does not apply q1800   Continuous Infusions: . sodium chloride 50 mL/hr at 08/01/15 2304   PRN Meds:.ipratropium-albuterol, ondansetron **OR** ondansetron (ZOFRAN) IV  Antibiotics    Anti-infectives    Start     Dose/Rate Route Frequency Ordered Stop    08/02/15 2200  azithromycin (ZITHROMAX) 500 mg in dextrose 5 % 250 mL IVPB  Status:  Discontinued     500 mg 250 mL/hr over 60 Minutes Intravenous  Once 08/02/15 0719 08/02/15 1028   08/02/15 2200  cefTRIAXone (ROCEPHIN) 1 g in dextrose 5 % 50 mL IVPB     1 g 100 mL/hr over 30 Minutes Intravenous Every 24 hours 08/02/15 0722     08/02/15 2200  azithromycin (ZITHROMAX) 500 mg in dextrose 5 % 250 mL IVPB     500 mg 250 mL/hr over 60 Minutes Intravenous Every 24 hours 08/02/15 1028     08/02/15 0800  azithromycin (ZITHROMAX) 500 mg in dextrose 5 % 250 mL IVPB  Status:  Discontinued     500 mg 250 mL/hr over 60 Minutes Intravenous  Once 08/02/15 0713 08/02/15 0719   08/01/15 2115  cefTRIAXone (ROCEPHIN) 1 g in dextrose 5 % 50 mL IVPB     1 g 100 mL/hr over 30 Minutes Intravenous  Once 08/01/15 2114 08/02/15 0129   08/01/15 2115  azithromycin (ZITHROMAX) 500 mg in dextrose 5 % 250 mL IVPB     500 mg 250 mL/hr over 60 Minutes Intravenous  Once 08/01/15 2114 08/02/15 0159        Subjective:   Jill Washington seen and examined today.  Patient states she continues to feel short of breath however improved since coming to the hospital. Continues to have cough which is productive. Patient denies any chest pain, abdominal pain, nausea or vomiting, diarrhea or constipation  Objective:   Filed Vitals:   08/01/15 2313 08/01/15 2350 08/02/15 0500 08/02/15 0904  BP: 144/50  134/49   Pulse: 72  73 74  Temp: 97.9 F (36.6 C)  97.7 F (36.5 C)   TempSrc: Oral  Oral   Resp: 18  20 20   Height: 5\' 7"  (1.702 m)     Weight: 90.855 kg (200 lb 4.8 oz)     SpO2: 96% 95% 95% 92%    Wt Readings from Last 3 Encounters:  08/01/15 90.855 kg (200 lb 4.8 oz)  04/28/15 86.75 kg (191 lb 4 oz)  01/31/15 85.458 kg (188 lb 6.4 oz)     Intake/Output Summary (Last 24 hours) at 08/02/15 1146 Last data filed at 08/02/15 0600  Gross per 24 hour  Intake 886.67 ml  Output    250 ml  Net 636.67 ml     Exam  General: Well developed, well nourished, NAD, appears stated age  HEENT: NCAT, mucous membranes moist.   Cardiovascular: S1 S2 auscultated, no rubs, murmurs or gallops. Regular rate and rhythm.  Respiratory: Diminished breath sounds, mild expiratory wheezing  Abdomen: Soft, nontender, nondistended, + bowel sounds  Extremities: warm dry without cyanosis clubbing or edema  Neuro: AAOx3, nonfocal  Psych: Normal affect and demeanor   Data Review   Micro Results No results found for this or any previous visit (from the past 240 hour(s)).  Radiology Reports Dg Chest 2 View  08/01/2015  CLINICAL DATA:  Dyspnea. EXAM: CHEST  2 VIEW COMPARISON:  01/31/2015 chest radiograph FINDINGS: Do lead right subclavian pacemaker is stable in configuration with lead tips overlying the right atrium and right ventricle. Stable cardiomediastinal silhouette with mild cardiomegaly. No pneumothorax. No pleural effusion. No pulmonary edema. Mild patchy opacity at the posterior costophrenic angle on the lateral view. Minimal curvilinear opacity at the left costophrenic angle on the frontal view. IMPRESSION: 1. Mild patchy opacity at the posterior costophrenic angle on the lateral view, which could represent a mild left lower lobe pneumonia. 2. Minimal scarring versus atelectasis at the lateral left lung base. 3. Stable mild cardiomegaly without pulmonary edema. 4. Recommend follow-up post treatment chest radiographs in 6-8 weeks. Electronically Signed   By: Ilona Sorrel M.D.   On: 08/01/2015 21:08    CBC  Recent Labs Lab 08/01/15 2009 08/02/15 0426  WBC 11.4* 16.9*  HGB 12.0 12.0  HCT 35.9* 36.5  PLT 132* 163  MCV 93.5 94.6  MCH 31.3 31.1  MCHC 33.4 32.9  RDW 13.0 13.0  LYMPHSABS 0.6* 0.6*  MONOABS 0.4 0.2  EOSABS 0.1 0.0  BASOSABS 0.0 0.0    Chemistries   Recent Labs Lab 08/01/15 2009 08/01/15 2352 08/02/15 0426  NA 142  --  141  K 3.9  --  3.9  CL 110  --  105  CO2 25  --   21*  GLUCOSE 133*  --  235*  BUN 19  --  23*  CREATININE 1.52*  --  1.77*  CALCIUM 9.2  --  9.5  MG  --  1.5*  --   AST  --   --  53*  ALT  --   --  28  ALKPHOS  --   --  45  BILITOT  --   --  1.0   ------------------------------------------------------------------------------------------------------------------ estimated creatinine clearance is 25.9 mL/min (by C-G formula based on Cr of 1.77). ------------------------------------------------------------------------------------------------------------------ No results for input(s): HGBA1C in the last 72 hours. ------------------------------------------------------------------------------------------------------------------ No results for input(s): CHOL, HDL, LDLCALC, TRIG, CHOLHDL, LDLDIRECT in the last 72 hours. ------------------------------------------------------------------------------------------------------------------ No results for input(s): TSH, T4TOTAL, T3FREE, THYROIDAB in the last 72 hours.  Invalid input(s): FREET3 ------------------------------------------------------------------------------------------------------------------ No results for input(s): VITAMINB12, FOLATE, FERRITIN, TIBC, IRON, RETICCTPCT in the last 72 hours.  Coagulation profile  Recent Labs Lab 07/26/15 1202 08/01/15 2009 08/02/15 0426  INR 1.6 1.96* 1.84*    No results for input(s): DDIMER in the last 72 hours.  Cardiac Enzymes  Recent Labs Lab 08/01/15 2009  TROPONINI <0.03   ------------------------------------------------------------------------------------------------------------------ Invalid input(s): POCBNP    Bijou Easler D.O. on 08/02/2015 at 11:46 AM  Between 7am to 7pm - Pager - [phone]  After 7pm go to [email or web address] - password [email or web address]  And look for the night coverage person covering for me after hours  [facility] Office  [phone]

## 2015-08-03 ENCOUNTER — Inpatient Hospital Stay (HOSPITAL_COMMUNITY): Payer: Medicare Other

## 2015-08-03 DIAGNOSIS — A419 Sepsis, unspecified organism: Secondary | ICD-10-CM

## 2015-08-03 DIAGNOSIS — J9620 Acute and chronic respiratory failure, unspecified whether with hypoxia or hypercapnia: Secondary | ICD-10-CM

## 2015-08-03 DIAGNOSIS — J189 Pneumonia, unspecified organism: Secondary | ICD-10-CM

## 2015-08-03 LAB — COMPREHENSIVE METABOLIC PANEL
ALK PHOS: 46 U/L (ref 38–126)
ALT: 35 U/L (ref 14–54)
AST: 48 U/L — ABNORMAL HIGH (ref 15–41)
Albumin: 3 g/dL — ABNORMAL LOW (ref 3.5–5.0)
Anion gap: 8 (ref 5–15)
BUN: 28 mg/dL — ABNORMAL HIGH (ref 6–20)
CALCIUM: 8.9 mg/dL (ref 8.9–10.3)
CHLORIDE: 108 mmol/L (ref 101–111)
CO2: 22 mmol/L (ref 22–32)
CREATININE: 1.67 mg/dL — AB (ref 0.44–1.00)
GFR, EST AFRICAN AMERICAN: 31 mL/min — AB (ref >60)
GFR, EST NON AFRICAN AMERICAN: 26 mL/min — AB (ref >60)
Glucose, Bld: 164 mg/dL — ABNORMAL HIGH (ref 65–99)
Potassium: 4 mmol/L (ref 3.5–5.1)
Sodium: 138 mmol/L (ref 135–145)
Total Bilirubin: 0.4 mg/dL (ref 0.3–1.2)
Total Protein: 6.2 g/dL — ABNORMAL LOW (ref 6.5–8.1)

## 2015-08-03 LAB — GLUCOSE, CAPILLARY
GLUCOSE-CAPILLARY: 86 mg/dL (ref 65–99)
Glucose-Capillary: 156 mg/dL — ABNORMAL HIGH (ref 65–99)
Glucose-Capillary: 70 mg/dL (ref 65–99)
Glucose-Capillary: 79 mg/dL (ref 65–99)

## 2015-08-03 LAB — PROCALCITONIN: PROCALCITONIN: 0.1 ng/mL

## 2015-08-03 LAB — PROTIME-INR
INR: 2.1 — ABNORMAL HIGH (ref 0.00–1.49)
Prothrombin Time: 23.4 seconds — ABNORMAL HIGH (ref 11.6–15.2)

## 2015-08-03 LAB — BLOOD GAS, ARTERIAL
Acid-base deficit: 3.2 mmol/L — ABNORMAL HIGH (ref 0.0–2.0)
Bicarbonate: 22.9 mEq/L (ref 20.0–24.0)
Drawn by: 227661
FIO2: 0.6
LHR: 18 {breaths}/min
MECHVT: 460 mL
O2 Saturation: 94.5 %
PATIENT TEMPERATURE: 98.6
PCO2 ART: 53.7 mmHg — AB (ref 35.0–45.0)
PEEP/CPAP: 5 cmH2O
PO2 ART: 81.6 mmHg (ref 80.0–100.0)
TCO2: 24.5 mmol/L (ref 0–100)
pH, Arterial: 7.253 — ABNORMAL LOW (ref 7.350–7.450)

## 2015-08-03 LAB — LEGIONELLA PNEUMOPHILA SEROGP 1 UR AG: L. pneumophila Serogp 1 Ur Ag: NEGATIVE

## 2015-08-03 LAB — CBC WITH DIFFERENTIAL/PLATELET
Basophils Absolute: 0 10*3/uL (ref 0.0–0.1)
Basophils Relative: 0 %
EOS PCT: 1 %
Eosinophils Absolute: 0.2 10*3/uL (ref 0.0–0.7)
HCT: 37 % (ref 36.0–46.0)
Hemoglobin: 12 g/dL (ref 12.0–15.0)
LYMPHS ABS: 0.4 10*3/uL — AB (ref 0.7–4.0)
LYMPHS PCT: 3 %
MCH: 31.1 pg (ref 26.0–34.0)
MCHC: 32.4 g/dL (ref 30.0–36.0)
MCV: 95.9 fL (ref 78.0–100.0)
MONOS PCT: 4 %
Monocytes Absolute: 0.6 10*3/uL (ref 0.1–1.0)
Neutro Abs: 13.9 10*3/uL — ABNORMAL HIGH (ref 1.7–7.7)
Neutrophils Relative %: 92 %
PLATELETS: 167 10*3/uL (ref 150–400)
RBC: 3.86 MIL/uL — AB (ref 3.87–5.11)
RDW: 13.4 % (ref 11.5–15.5)
WBC: 15.1 10*3/uL — AB (ref 4.0–10.5)

## 2015-08-03 LAB — MAGNESIUM: MAGNESIUM: 2.1 mg/dL (ref 1.7–2.4)

## 2015-08-03 LAB — LACTIC ACID, PLASMA: LACTIC ACID, VENOUS: 1.5 mmol/L (ref 0.5–2.0)

## 2015-08-03 LAB — MRSA PCR SCREENING: MRSA BY PCR: NEGATIVE

## 2015-08-03 LAB — HEMOGLOBIN A1C
Hgb A1c MFr Bld: 5.3 % (ref 4.8–5.6)
Mean Plasma Glucose: 105 mg/dL

## 2015-08-03 LAB — TROPONIN I
TROPONIN I: 0.07 ng/mL — AB (ref <0.031)
Troponin I: 0.04 ng/mL — ABNORMAL HIGH (ref <0.031)

## 2015-08-03 MED ORDER — PANTOPRAZOLE SODIUM 40 MG PO PACK
40.0000 mg | PACK | ORAL | Status: DC
  Administered 2015-08-03 – 2015-08-05 (×3): 40 mg
  Filled 2015-08-03 (×5): qty 20

## 2015-08-03 MED ORDER — MORPHINE SULFATE (PF) 2 MG/ML IV SOLN
INTRAVENOUS | Status: AC
  Filled 2015-08-03: qty 1

## 2015-08-03 MED ORDER — MIDAZOLAM HCL 2 MG/2ML IJ SOLN
1.0000 mg | INTRAMUSCULAR | Status: AC | PRN
  Administered 2015-08-03 (×3): 1 mg via INTRAVENOUS
  Filled 2015-08-03 (×2): qty 2

## 2015-08-03 MED ORDER — MIDAZOLAM HCL 2 MG/2ML IJ SOLN
INTRAMUSCULAR | Status: AC
  Administered 2015-08-03: 2 mg
  Filled 2015-08-03: qty 4

## 2015-08-03 MED ORDER — VANCOMYCIN HCL IN DEXTROSE 1-5 GM/200ML-% IV SOLN
1000.0000 mg | INTRAVENOUS | Status: DC
  Administered 2015-08-05: 1000 mg via INTRAVENOUS
  Filled 2015-08-03: qty 200

## 2015-08-03 MED ORDER — ETOMIDATE 2 MG/ML IV SOLN
10.0000 mg | Freq: Once | INTRAVENOUS | Status: AC
  Administered 2015-08-03: 10 mg via INTRAVENOUS

## 2015-08-03 MED ORDER — INSULIN ASPART 100 UNIT/ML ~~LOC~~ SOLN
0.0000 [IU] | SUBCUTANEOUS | Status: DC
  Administered 2015-08-03: 3 [IU] via SUBCUTANEOUS

## 2015-08-03 MED ORDER — METOPROLOL TARTRATE 1 MG/ML IV SOLN
INTRAVENOUS | Status: AC
  Filled 2015-08-03: qty 10

## 2015-08-03 MED ORDER — FUROSEMIDE 10 MG/ML IJ SOLN
INTRAMUSCULAR | Status: AC
  Filled 2015-08-03: qty 8

## 2015-08-03 MED ORDER — VITAL HIGH PROTEIN PO LIQD
1300.0000 mL | ORAL | Status: DC
  Filled 2015-08-03 (×2): qty 2000

## 2015-08-03 MED ORDER — FUROSEMIDE 10 MG/ML IJ SOLN
40.0000 mg | Freq: Once | INTRAMUSCULAR | Status: AC
  Administered 2015-08-03: 40 mg via INTRAVENOUS

## 2015-08-03 MED ORDER — MORPHINE SULFATE (PF) 2 MG/ML IV SOLN
1.0000 mg | INTRAVENOUS | Status: AC
  Administered 2015-08-03: 1 mg via INTRAVENOUS

## 2015-08-03 MED ORDER — CHLORHEXIDINE GLUCONATE 0.12% ORAL RINSE (MEDLINE KIT)
15.0000 mL | Freq: Two times a day (BID) | OROMUCOSAL | Status: DC
  Administered 2015-08-03 – 2015-08-05 (×4): 15 mL via OROMUCOSAL

## 2015-08-03 MED ORDER — LEVOTHYROXINE SODIUM 125 MCG PO TABS
125.0000 ug | ORAL_TABLET | Freq: Every day | ORAL | Status: DC
  Administered 2015-08-03 – 2015-08-07 (×5): 125 ug
  Filled 2015-08-03 (×6): qty 1

## 2015-08-03 MED ORDER — METOPROLOL TARTRATE 1 MG/ML IV SOLN
10.0000 mg | INTRAVENOUS | Status: AC
  Administered 2015-08-03: 10 mg via INTRAVENOUS

## 2015-08-03 MED ORDER — VITAL HIGH PROTEIN PO LIQD
1000.0000 mL | ORAL | Status: DC
  Filled 2015-08-03: qty 1000

## 2015-08-03 MED ORDER — VITAL HIGH PROTEIN PO LIQD
1000.0000 mL | ORAL | Status: DC
  Filled 2015-08-03 (×2): qty 1000

## 2015-08-03 MED ORDER — SODIUM CHLORIDE 0.9 % IV SOLN
INTRAVENOUS | Status: DC
  Administered 2015-08-03 – 2015-08-05 (×2): via INTRAVENOUS

## 2015-08-03 MED ORDER — PRAVASTATIN SODIUM 40 MG PO TABS
40.0000 mg | ORAL_TABLET | Freq: Every day | ORAL | Status: DC
  Administered 2015-08-03 – 2015-08-08 (×6): 40 mg
  Filled 2015-08-03 (×7): qty 1

## 2015-08-03 MED ORDER — FENTANYL CITRATE (PF) 100 MCG/2ML IJ SOLN
INTRAMUSCULAR | Status: AC
  Administered 2015-08-03: 100 ug
  Filled 2015-08-03: qty 4

## 2015-08-03 MED ORDER — FENTANYL CITRATE (PF) 100 MCG/2ML IJ SOLN: 50.0000 ug | INTRAMUSCULAR | Status: DC | PRN

## 2015-08-03 MED ORDER — METOPROLOL TARTRATE 1 MG/ML IV SOLN: 2.5000 mg | INTRAVENOUS | Status: DC | PRN

## 2015-08-03 MED ORDER — METOPROLOL TARTRATE 1 MG/ML IV SOLN: 10.0000 mg | INTRAVENOUS | Status: DC

## 2015-08-03 MED ORDER — WARFARIN SODIUM 1 MG PO TABS
1.0000 mg | ORAL_TABLET | Freq: Once | ORAL | Status: AC
  Administered 2015-08-03: 1 mg via ORAL
  Filled 2015-08-03: qty 1

## 2015-08-03 MED ORDER — MIDAZOLAM HCL 2 MG/2ML IJ SOLN
1.0000 mg | INTRAMUSCULAR | Status: DC | PRN
  Administered 2015-08-04 – 2015-08-05 (×6): 1 mg via INTRAVENOUS
  Filled 2015-08-03 (×6): qty 2

## 2015-08-03 MED ORDER — VANCOMYCIN HCL 10 G IV SOLR
1500.0000 mg | Freq: Once | INTRAVENOUS | Status: AC
  Administered 2015-08-03: 1500 mg via INTRAVENOUS
  Filled 2015-08-03: qty 1500

## 2015-08-03 MED ORDER — CHLORHEXIDINE GLUCONATE 0.12% ORAL RINSE (MEDLINE KIT)
15.0000 mL | Freq: Two times a day (BID) | OROMUCOSAL | Status: DC
  Administered 2015-08-03: 15 mL via OROMUCOSAL

## 2015-08-03 MED ORDER — VITAL HIGH PROTEIN PO LIQD
1000.0000 mL | ORAL | Status: DC
  Administered 2015-08-03 – 2015-08-04 (×2): 1000 mL
  Filled 2015-08-03 (×4): qty 1000

## 2015-08-03 MED ORDER — METOPROLOL TARTRATE 1 MG/ML IV SOLN
10.0000 mg | Freq: Once | INTRAVENOUS | Status: DC
Start: 2015-08-03 — End: 2015-08-07

## 2015-08-03 MED ORDER — ANTISEPTIC ORAL RINSE SOLUTION (CORINZ)
7.0000 mL | OROMUCOSAL | Status: DC
  Administered 2015-08-03 (×5): 7 mL via OROMUCOSAL

## 2015-08-03 MED ORDER — FUROSEMIDE 10 MG/ML IJ SOLN
INTRAMUSCULAR | Status: AC
  Filled 2015-08-03: qty 4

## 2015-08-03 MED ORDER — FUROSEMIDE 10 MG/ML IJ SOLN
80.0000 mg | Freq: Once | INTRAMUSCULAR | Status: AC
  Administered 2015-08-03: 80 mg via INTRAVENOUS

## 2015-08-03 MED ORDER — ANTISEPTIC ORAL RINSE SOLUTION (CORINZ)
7.0000 mL | Freq: Four times a day (QID) | OROMUCOSAL | Status: DC
  Administered 2015-08-04 – 2015-08-10 (×15): 7 mL via OROMUCOSAL

## 2015-08-03 MED ORDER — BISACODYL 10 MG RE SUPP: 10.0000 mg | Freq: Every day | RECTAL | Status: DC | PRN

## 2015-08-03 MED ORDER — IPRATROPIUM-ALBUTEROL 0.5-2.5 (3) MG/3ML IN SOLN: 3.0000 mL | RESPIRATORY_TRACT | Status: DC | PRN

## 2015-08-03 MED ORDER — SENNOSIDES 8.8 MG/5ML PO SYRP: 5.0000 mL | ORAL_SOLUTION | Freq: Two times a day (BID) | ORAL | Status: DC | PRN

## 2015-08-03 MED ORDER — MIDAZOLAM HCL 2 MG/2ML IJ SOLN
INTRAMUSCULAR | Status: AC
  Filled 2015-08-03: qty 4

## 2015-08-03 MED ORDER — ACETAMINOPHEN 160 MG/5ML PO SOLN
650.0000 mg | Freq: Four times a day (QID) | ORAL | Status: DC | PRN
  Administered 2015-08-03: 650 mg
  Filled 2015-08-03: qty 20.3

## 2015-08-03 MED ORDER — VITAL HIGH PROTEIN PO LIQD
1000.0000 mL | ORAL | Status: DC
  Filled 2015-08-03 (×3): qty 1000

## 2015-08-03 MED ORDER — FENTANYL CITRATE (PF) 100 MCG/2ML IJ SOLN
50.0000 ug | INTRAMUSCULAR | Status: DC | PRN
  Administered 2015-08-03 – 2015-08-05 (×15): 50 ug via INTRAVENOUS
  Filled 2015-08-03 (×15): qty 2

## 2015-08-03 NOTE — Progress Notes (Signed)
[place] Progress Note Patient Name: Jill Washington DOB: [birth year] MRN: [id number]   Date of Service  08/03/2015  HPI/Events of Note  Temp  = 101.3 F. Patient has already been pan cultured today and started on Vancomycin, Rocephin and Azithromycin.  eICU Interventions  Will order Tylenol liquid via OGT Q 6 hours PRN Temp > 101.5 F.     Intervention Category Minor Interventions: Routine modifications to care plan (e.g. PRN medications for pain, fever)  Jill Washington 08/03/2015, 7:03 PM

## 2015-08-03 NOTE — Consult Note (Signed)
PULMONARY / CRITICAL CARE MEDICINE   Name: Jill Washington MRN: [id number] DOB: December 21, 1925    ADMISSION DATE:  08/01/2015 CONSULTATION DATE:  08/03/2015  REFERRING MD :  [facility]  CHIEF COMPLAINT:  Short of breath  INITIAL PRESENTATION:  81 yo female admitted with productive cough, dyspnea, myalgias from CAP.  Transferred to ICU 10/20 with VDRF.  STUDIES:   SIGNIFICANT EVENTS: 10/18 Admit 10/20 To ICU, VDRF  HISTORY OF PRESENT ILLNESS:   Pt unable to provide hx.  Hx from chart review and d/w [facility] team.  81 yo female developed shortness of breath, sore throat, cough with yellow sputum, and body aches.  She has CXR at urgent care and dx with pneumonia.  She was given IM Abx.  She was sent home with doxycycline, prednisone, and nitrofurantoin.  Her symptoms got worse, and she was admitted to hospital.  She was continued on Abx.  She developed respiratory distress, altered mental status, and hypoxia.  She has gurgling respirations.  [facility] team d/w pt's family, and they wanted to continue all aggressive measures.  She was transferred to ICU, and subsequently intubated.Marland Kitchen  PAST MEDICAL HISTORY :  She  has a past medical history of History of empyema of pleura; Hypertension; Mitral valve prolapse; History of atrial fibrillation; Sick sinus syndrome ([place]); Venous insufficiency; Hypercholesteremia; Hypothyroidism; Hiatal hernia; IBS (irritable bowel syndrome); Colon polyps; Fibromyalgia; Vitamin D deficiency; Peripheral neuropathy ([place]); Critical illness polyneuropathy ([place]); History of skin cancer; Diverticulosis of sigmoid colon (02/05/2007); GERD (gastroesophageal reflux disease); Pacemaker; Arthritis; History of gallstones; Fecal incontinence; Urinary incontinence; Blood transfusion; Chicken pox (as a child); Measles (as a child); Mumps (as a child); Shingles (8 yrs ago); Overactive bladder (05/07/2011); CHB (complete heart block) ([place]) (11/10/2014); and Medicare annual  wellness visit, subsequent (05/15/2015).   PAST SURGICAL HISTORY: She  has past surgical history that includes Cervical spine surgery; Cholecystectomy (03/2005); Total abdominal hysterectomy w/ bilateral salpingoophorectomy; Colonoscopy (02/05/2007,02/05/05); Esophagogastroduodenoscopy (02/05/2005,01/22/90); pacemaker implanted (05/11/97,08/02/05); Appendectomy (1971); Tonsillectomy; Cardiac catheterization (05/10/1997); NM MYOCAR PERF WALL MOTION (08/24/2010); and pacemaker generator change (N/A, 02/25/2012).   Prior to Admission medications   Medication Sig Start Date End Date Taking? Authorizing Provider  amiodarone (PACERONE) 200 MG tablet Take 1 tablet (200 mg total) by mouth daily. 11/18/14  Yes Mihai Croitoru, MD  atenolol (TENORMIN) 50 MG tablet TAKE ONE AND ONE-HALF TABLETS DAILY Patient taking differently: TAKE ONE AND ONE-HALF TABLETS BY MOUTH DAILY 03/08/15  Yes Mihai Croitoru, MD  cholecalciferol (VITAMIN D) 1000 UNITS tablet Take 1,000 Units by mouth daily.   Yes Historical Provider, MD  esomeprazole (NEXIUM) 40 MG capsule TAKE 1 CAPSULE DAILY BEFORE BREAKFAST Patient taking differently: Take 40 mg by mouth daily before breakfast.  12/22/14  Yes Mosie Lukes, MD  furosemide (LASIX) 20 MG tablet 2 tabs po daily x 3 days then 1 tab po daily then 1 tab po daily prn SOB/Edema/weight Gain>3# in 24 hours. Patient taking differently: Take 20-40 mg by mouth See admin instructions. Take 2 tablets (40 mg) by mouth daily x 3 days, then 1 tablet (20 mg) daily as needed for shortness of breath, edema or weight gain of more than 3 lbs in 24 hours 04/05/14  Yes Mosie Lukes, MD  gabapentin (NEURONTIN) 300 MG capsule TAKE 1 CAPSULE TWICE A DAY Patient taking differently: TAKE 1 CAPSULE BY MOUTH TWICE A DAY 01/11/15  Yes Edward Saguier, PA-C  ibuprofen (ADVIL,MOTRIN) 200 MG tablet Take 600 mg by mouth 2 (two) times daily as  needed (pain).   Yes Historical Provider, MD  levothyroxine (SYNTHROID, LEVOTHROID) 125  MCG tablet TAKE 1 TABLET DAILY BEFORE BREAKFAST Patient taking differently: TAKE 1 TABLET BY MOUTH DAILY BEFORE BREAKFAST 05/10/15  Yes Mosie Lukes, MD  loperamide (IMODIUM) 2 MG capsule Take 2 mg by mouth 4 (four) times daily as needed for diarrhea or loose stools.    Yes Historical Provider, MD  mirabegron ER (MYRBETRIQ) 50 MG TB24 Take 50 mg by mouth daily.   Yes Historical Provider, MD  Multiple Vitamin (MULTIVITAMIN WITH MINERALS) TABS tablet Take 1 tablet by mouth daily.   Yes Historical Provider, MD  polyvinyl alcohol (ARTIFICIAL TEARS) 1.4 % ophthalmic solution Place 1 drop into both eyes 3 (three) times daily as needed for dry eyes.   Yes Historical Provider, MD  pravastatin (PRAVACHOL) 40 MG tablet Take 1 tablet (40 mg total) by mouth daily. Patient taking differently: Take 40 mg by mouth at bedtime.  12/05/14  Yes Mosie Lukes, MD  valsartan-hydrochlorothiazide (DIOVAN-HCT) 80-12.5 MG per tablet TAKE 1 TABLET DAILY Patient taking differently: TAKE 1 TABLET BY MOUTH DAILY 03/08/15  Yes Mihai Croitoru, MD  vitamin B-12 (CYANOCOBALAMIN) 500 MCG tablet Take 500 mcg by mouth daily.   Yes Historical Provider, MD  warfarin (COUMADIN) 2 MG tablet TAKE 1 TABLET DAILY OR AS DIRECTED Patient taking differently: TAKE 1 TABLET DAILY BY MOUTH ON MONDAYS AND 1/2 TABLET ON ALL OTHER DAYS OF THE WEEK OR AS DIRECTED 07/28/15  Yes Mihai Croitoru, MD  doxycycline (VIBRA-TABS) 100 MG tablet Take 100 mg by mouth 2 (two) times daily. 10 day course filled 08/01/15    Historical Provider, MD  furosemide (LASIX) 20 MG tablet TAKE 2 TABLETS DAILY FOR 3 DAYS, THEN 1 TABLET DAILY AS DIRECTED, THEN 1 TABLET AS NEEDED FOR SHORTNESS OF BREATH, EDEMA,WEIGHT GAIN OF 3 PO Patient not taking: Reported on 08/01/2015 08/01/15   Mosie Lukes, MD  nitrofurantoin (MACRODANTIN) 100 MG capsule Take 100 mg by mouth 2 (two) times daily. 7 day course filled 07/31/15 - not yet started 07/31/15   Historical Provider, MD   pravastatin (PRAVACHOL) 40 MG tablet TAKE 1 TABLET DAILY Patient not taking: Reported on 08/01/2015 05/14/15   Mosie Lukes, MD  predniSONE (DELTASONE) 20 MG tablet Take 20 mg by mouth daily with breakfast. 5 day course filled 08/01/15    Historical Provider, MD   Allergies  Allergen Reactions  . Cymbalta [Duloxetine Hcl] Other (See Comments)    Could not think straight  . Iodinated Diagnostic Agents Other (See Comments)    Pt was not told what the reaction was  . Other Itching    Dial Soap    FAMILY HISTORY:  Her family history includes Alcohol abuse in her daughter; Cancer (age of onset: 60) in her sister; Colon cancer in an other family member; Diabetes in her brother, brother, daughter, father, sister, sister, and son; Diabetes (age of onset: 31) in her mother; Gallbladder disease in her father; Heart attack in her brother; Hypertension in her daughter and sister; Pancreatic cancer in her mother; Stomach cancer in her maternal aunt.  SOCIAL HISTORY: She  reports that she quit smoking about 63 years ago. Her smoking use included Cigarettes. She has a 12 pack-year smoking history. She has never used smokeless tobacco. She reports that she does not drink alcohol or use illicit drugs.  REVIEW OF SYSTEMS:   Unable to obtain.  SUBJECTIVE:   VITAL SIGNS: Temp:  [97.9 F (36.6 C)-98.4  F (36.9 C)] 98.4 F (36.9 C) (10/19 2000) Pulse Rate:  [74-75] 75 (10/19 2000) Resp:  [20-23] 23 (10/19 2000) BP: (127-144)/(62-79) 144/62 mmHg (10/19 2000) SpO2:  [92 %-96 %] 96 % (10/19 2056) Weight:  [203 lb (92.08 kg)] 203 lb (92.08 kg) (10/19 2000) HEMODYNAMICS:   VENTILATOR SETTINGS:   INTAKE / OUTPUT:  Intake/Output Summary (Last 24 hours) at 08/03/15 0720 Last data filed at 08/03/15 0234  Gross per 24 hour  Intake    360 ml  Output    450 ml  Net    -90 ml    PHYSICAL EXAMINATION: General: ill appearing, using accessory muscles Neuro:  Somnolent, arousable, unable to follow  commands, moving extremities randomly HEENT:  Pupils reactive, dry mucosa, wears dentures, yellow to blood tinged secretions Cardiovascular:  Regular, 2/6 SM Lungs:  B/l crackles, paradoxical breathing pattern Abdomen:  Soft, non tender, + bowel sounds Musculoskeletal:  1+ edema Skin:  Chronic venous stasis changes, onychomycosis of toes  LABS:  CBC  Recent Labs Lab 08/01/15 2009 08/02/15 0426  WBC 11.4* 16.9*  HGB 12.0 12.0  HCT 35.9* 36.5  PLT 132* 163   Coag's  Recent Labs Lab 08/01/15 2009 08/02/15 0426  INR 1.96* 1.84*   BMET  Recent Labs Lab 08/01/15 2009 08/02/15 0426  NA 142 141  K 3.9 3.9  CL 110 105  CO2 25 21*  BUN 19 23*  CREATININE 1.52* 1.77*  GLUCOSE 133* 235*   Electrolytes  Recent Labs Lab 08/01/15 2009 08/01/15 2352 08/02/15 0426  CALCIUM 9.2  --  9.5  MG  --  1.5*  --   PHOS  --  3.3  --    Sepsis Markers  Recent Labs Lab 08/01/15 2029  LATICACIDVEN 2.15*   ABG No results for input(s): PHART, PCO2ART, PO2ART in the last 168 hours.   Liver Enzymes  Recent Labs Lab 08/02/15 0426  AST 53*  ALT 28  ALKPHOS 45  BILITOT 1.0  ALBUMIN 3.5   Cardiac Enzymes  Recent Labs Lab 08/01/15 2009  TROPONINI <0.03   Glucose  Recent Labs Lab 08/02/15 1228 08/02/15 2006  GLUCAP 125* 139*    Imaging Dg Chest 2 View  08/01/2015  CLINICAL DATA:  Dyspnea. EXAM: CHEST  2 VIEW COMPARISON:  01/31/2015 chest radiograph FINDINGS: Do lead right subclavian pacemaker is stable in configuration with lead tips overlying the right atrium and right ventricle. Stable cardiomediastinal silhouette with mild cardiomegaly. No pneumothorax. No pleural effusion. No pulmonary edema. Mild patchy opacity at the posterior costophrenic angle on the lateral view. Minimal curvilinear opacity at the left costophrenic angle on the frontal view. IMPRESSION: 1. Mild patchy opacity at the posterior costophrenic angle on the lateral view, which could represent  a mild left lower lobe pneumonia. 2. Minimal scarring versus atelectasis at the lateral left lung base. 3. Stable mild cardiomegaly without pulmonary edema. 4. Recommend follow-up post treatment chest radiographs in 6-8 weeks. Electronically Signed   By: Ilona Sorrel M.D.   On: 08/01/2015 21:08      ASSESSMENT / PLAN:  PULMONARY ETT 10/20 >> A: Acute respiratory failure 2nd to CAP and acute pulmonary edema. P:   Full vent support F/u CXR, ABG D/c pulmicort PRN BDs  CARDIOVASCULAR A:  Acute on chronic diastolic/systolic CHF. Hx of A fib on amiodarone, coumadin. Hx of HTN, Complete heart block s/p PM, HLD. P:  Coumadin per pharmacy Continue amiodarone, pravachol Lopressor IV prn for HR > 120 Hold outpt tenormin, lasix,  valsartan, HCTZ F/u Echo, cardiac enzymes, ECG  RENAL A:   CKD stage 3. Overactive bladder. Hypomagnesemia. P:   Monitor renal fx, urine outpt Replace electrolytes as needed Hold outpt mirabegron  GASTROINTESTINAL A:   Nutrition. Hx of HH, GERD, IBS. P:   Tube feeds Protonix  HEMATOLOGIC A:   Leukocytosis. P:  F/u CBC  INFECTIOUS A:   Sepsis with CAP. P:   Day 2 of Abx, currently on rocephin, zithromax Add vancomycin 10/20 pending cx results F/u lactic acid, procalcitonin  Blood 10/18 >> Pneumococcal Ag 10/18 >> negative Legionella Ag 10/18 >> Sputum 10/20 >>  ENDOCRINE A:   Hx of DM with peripheral neuropathy. Hx of hypothyroidism. P:   SSI Continue synthroid  NEUROLOGIC A:  Hx of fibromyalgia. P:   RASS goal: -1 PRN versed, fentanyl Hold outpt neurontin  CC time 54 minutes.  Chesley Mires, MD [facility] Pulmonary/Critical Care 08/03/2015, 7:30 AM Pager:  [phone] After 3pm call: [phone]

## 2015-08-03 NOTE — Progress Notes (Signed)
Called at 0615 per floor RN for Pt in respiratory distress Po2 82%. RN advised to increase Pt 02 from 3 LNC to NRB while RRT RN en route. Pt found resting in bed, awake, follows simple commands and nods head to questions. Lungs congested with rales through out. Po2 89-91% on NRB, RR 40s. BP [facility] NP K. Baltazar Najjar paged and updated on Pt status, 80 mg lasix, 10 mg lopressor and 1 mg Morphine IVP ordered. Meds given and [facility] NP paged to bedside as Pt breathing continued to worsen. Pt initially able to cough up pink tinged sputum, NT suctioned at bedside with moderate amount of sputum yielded. [facility] NP at bedside [phone]. Additional 10 mg lopressor and 40 mg lasix ordered and given.  Pt assessed with decreasing LOC and transferred urgently to 2 M ICU at 0700 for intubation. Care assumed by ICU staff, handoff provided to [facility].

## 2015-08-03 NOTE — Progress Notes (Signed)
ANTIBIOTIC CONSULT NOTE - INITIAL  Pharmacy Consult for Vancomycin  Indication: rule out pneumonia  Allergies  Allergen Reactions  . Cymbalta [Duloxetine Hcl] Other (See Comments)    Could not think straight  . Iodinated Diagnostic Agents Other (See Comments)    Pt was not told what the reaction was  . Other Itching    Dial Soap    Patient Measurements: Height: 5\' 7"  (170.2 cm) Weight: 203 lb (92.08 kg) IBW/kg (Calculated) : 61.6 Adjusted Body Weight: 75 kg  Vital Signs: Temp: 98.4 F (36.9 C) (10/19 2000) BP: 144/62 mmHg (10/19 2000) Pulse Rate: 70 (10/20 0717) Intake/Output from previous day: 10/19 0701 - 10/20 0700 In: 360 [P.O.:60; IV Piggyback:300] Out: 450 [Urine:450] Intake/Output from this shift:    Labs:  Recent Labs  08/01/15 2009 08/02/15 0426  WBC 11.4* 16.9*  HGB 12.0 12.0  PLT 132* 163  CREATININE 1.52* 1.77*   Estimated Creatinine Clearance: 26.1 mL/min (by C-G formula based on Cr of 1.77). No results for input(s): VANCOTROUGH, VANCOPEAK, VANCORANDOM, GENTTROUGH, GENTPEAK, GENTRANDOM, TOBRATROUGH, TOBRAPEAK, TOBRARND, AMIKACINPEAK, AMIKACINTROU, AMIKACIN in the last 72 hours.   Microbiology: Recent Results (from the past 720 hour(s))  Culture, sputum-assessment     Status: None   Collection Time: 08/02/15  1:08 PM  Result Value Ref Range Status   Specimen Description EXPECTORATED SPUTUM  Final   Special Requests NONE  Final   Sputum evaluation   Final    THIS SPECIMEN IS ACCEPTABLE. RESPIRATORY CULTURE REPORT TO FOLLOW.   Report Status 08/02/2015 FINAL  Final   Assessment: 81 y.o. female with SOB/PNA for empiric antibiotics  Goal of Therapy:  Vancomycin trough level 15-20 mcg/ml  Plan:  Vancomycin 1500 mg IV now, then 1 g IV q48h  Lillith Mcneff, [place] 08/03/2015,7:32 AM

## 2015-08-03 NOTE — Care Management Note (Signed)
Case Management Note  Patient Details  Name: Jill Washington MRN: [id number] Date of Birth: 1926/10/05  Subjective/Objective:     Note initiated from Loletta Specter. Pt admitted for CAP. Pt is from home alone, however daughter is visiting from [place]. Per Staff RN pt has had multiple falls in the home.   tx to ICU for worsening resp failure and was intubated on 10-19   Action/Plan: CM did ask for PT/OT consult. CM will continue to monitor for disposition needs.   Expected Discharge Date:                  Expected Discharge Plan:  [place]  In-House Referral:     Discharge planning Services  CM Consult  Post Acute Care Choice:    Choice offered to:     DME Arranged:    DME Agency:     HH Arranged:    [place] Agency:     Status of Service:  In process, will continue to follow  Medicare Important Message Given:    Date Medicare IM Given:    Medicare IM give by:    Date Additional Medicare IM Given:    Additional Medicare Important Message give by:     If discussed at [place] of Stay Meetings, dates discussed:    Additional Comments:  Vergie Living, RN 08/03/2015, 12:00 PM

## 2015-08-03 NOTE — Progress Notes (Signed)
[place] for warfarin Indication: atrial fibrillation  Allergies  Allergen Reactions  . Cymbalta [Duloxetine Hcl] Other (See Comments)    Could not think straight  . Iodinated Diagnostic Agents Other (See Comments)    Pt was not told what the reaction was  . Other Itching    Dial Soap    Patient Measurements: Height: 5\' 7"  (170.2 cm) Weight: 203 lb (92.08 kg) IBW/kg (Calculated) : 61.6  Vital Signs: Temp: 99.7 F (37.6 C) (10/20 0900) BP: 119/45 mmHg (10/20 0900) Pulse Rate: 77 (10/20 0900)  Labs:  Recent Labs  08/01/15 2009 08/02/15 0426 08/03/15 0725 08/03/15 0742  HGB 12.0 12.0 12.0  --   HCT 35.9* 36.5 37.0  --   PLT 132* 163 167  --   LABPROT 22.2* 21.2*  --  23.4*  INR 1.96* 1.84*  --  2.10*  CREATININE 1.52* 1.77* 1.67*  --   TROPONINI <0.03  --  0.07*  --     Estimated Creatinine Clearance: 27.7 mL/min (by C-G formula based on Cr of 1.67).   Assessment: 81 yo F to be continued on home warfarin for afib.  Warfarin PTA dose = 1 mg every day except 2 mg on Mondays (8 mg weekly). Last dose PTA was 10/18 prior to coming to the hospital.  INR 2.01 this morning. hgb 12, plts 167 - stable, no bleeding noted.   Goal of Therapy:  INR 2-3 Monitor platelets by anticoagulation protocol: Yes   Plan:  -warfarin 1mg  po x1 tonight -daily INR -follow for s/s bleeding -follow for any effect antibiotics (azithromycin and ceftriaxone) may have on INR along with antibiotic LOT  Melburn Popper, PharmD Clinical Pharmacy Resident Pager: [phone] 08/03/2015 12:17 PM

## 2015-08-03 NOTE — Progress Notes (Addendum)
Initial Nutrition Assessment  DOCUMENTATION CODES:   Obesity unspecified  INTERVENTION:  Vital High Protein Goal Rate: 54 ml provides 1300 calories 113.75g protein 200 ml flush QID   1887 ml free water  Day 1 of TF protocol start feed at 25 mL/hr for the remainder of the day.   Day 2 of TF protocol at 0600 start new goal rate of 32mL (54 mL/hr) run from 0600-05:59.   Maintain at rate ordered unless TF's are held. If TF's are held, calculate the new volume to be provided and adjust the rate to provide total volume ordered within 24 hours (total volume-volume fed, divide this by remaining hours in the 24 hr period to get new goal rate). Max rate of 150 mL/hr. Each day at 0600 return to ordered rate.   NUTRITION DIAGNOSIS:   Inadequate oral intake related to inability to eat as evidenced by NPO status.  GOAL:   Provide needs based on ASPEN/SCCM guidelines  MONITOR:   I & O's, Labs, Diet advancement, Vent status, Skin  REASON FOR ASSESSMENT:   Consult Enteral/tube feeding initiation and management  ASSESSMENT:  Jill Washington is a 81 y.o. female with past medical history of pleural empyema, hypertension, mitral valve prolapse, atrial fibrillation, sick sinus syndrome, hyperlipidemia, hypothyroidism, hiatal hernia, IBS, fibromyalgia who comes to the emergency department due to complaints of shortness of breath for 1 day.  Pt was diagnosed with pneumonia, has progressed to require intubation in ICU. PRN Fentanyl for sedation and comfort. Pt displays little to no signs of malnutrition based on NFPA. No family was present to provide diet hx. Pt has Stg II pressure ulcer to coccyx. Consulted for Tube feed, will provide Vital High Protein with goal rate of 54 ml.    Diet Order:  Diet NPO time specified  Skin:  Wound (see comment) (Echymosis to arms, Stg 2 Pressure ulcer on coccyx.)  Last BM:  08/02/2015  Height:   Ht Readings from Last 1 Encounters:  08/03/15 5\' 7"   (1.702 m)    Weight:   Wt Readings from Last 1 Encounters:  08/02/15 203 lb (92.08 kg)    Ideal Body Weight:  61.36 kg  BMI:  Body mass index is 31.79 kg/(m^2).  Estimated Nutritional Needs:   Kcal:  1300 calories  Protein:  123 grams  Fluid:  >/= 1.3L  EDUCATION NEEDS:   No education needs identified at this time  Satira Anis. Maurine Mowbray, MS, RD LDN After Hours/Weekend Pager [phone]

## 2015-08-03 NOTE — Progress Notes (Signed)
RN called to the room by patient's daughter. Patient found sitting up, very short of breath, using abdominal muscles to breathe. Patient sounded wet, congested, and had expiratory rhonchi. Patient is on 2L of oxygen. Patient asked for PRN breathing treatment. Respiratory called to administer breathing treatment. O2 saturation obtained by RN: 85%. RN went ahead and administered breathing treatment. Patient didn't tolerate breathing treatment, still short of breath. O2 sat reobtained: 83%. RN increased oxygen to 3L. Rapid was called. Respiratory placed patient on a venturi mask. Patient's O2 didn't improve. Rapid RN advised to place patient on NRB. NP Baltazar Najjar paged. Vitals signs BP 233/132, o2 87%, Resp 30. Orders for 80mg  of Lasix, 10mg  of Lopressor, 1 mg of Morphine, and foley catheter, and STAT chest x-ray placed. Medication administered. NP called about above information and updated. NP at beside. Additional lasix 40 mg, lopressor 10 mg. Patient transferred to ICU with declining status. Report given to receiving RN. Daughter at ICU waiting room.   Ermalinda Memos, RN

## 2015-08-03 NOTE — Procedures (Signed)
Intubation Procedure Note BRANWEN CARLEE [id number] 1926-06-29  Procedure: Intubation Indications: Respiratory insufficiency  Procedure Details Consent: Risks of procedure as well as the alternatives and risks of each were explained to the (patient/caregiver).  Consent for procedure obtained. Time Out: Verified patient identification, verified procedure, site/side was marked, verified correct patient position, special equipment/implants available, medications/allergies/relevent history reviewed, required imaging and test results available.  Performed  Maximum sterile technique was used including gloves and hand hygiene.  MAC and 3    Evaluation Hemodynamic Status: BP stable throughout; O2 sats: stable throughout Patient's Current Condition: stable Complications: No apparent complications Patient did tolerate procedure well. Chest X-ray ordered to verify placement.  CXR: pending.   Chesley Mires, MD [facility] Pulmonary/Critical Care 08/03/2015, 7:15 AM Pager:  [phone] After 3pm call: [phone]

## 2015-08-03 NOTE — Progress Notes (Signed)
Shift event: RN paged this NP early this am because pt was having respiratory distress with O2 sat in the low 80s, up to 85% on ventimask, up to 90s on NRB. + crackles and congestion. BP 230s. RR 26 with increased WOB. RRRN at bedside.  Orders: Lasix 80mg , MSO4 1mg , Metoprolol 10mg , CXR and NP to bedside.  S: can not participate in ROS due to acute status.  O: Pt is awake, responds to name calling. Nods head in understanding. BP 190s after Metoprolol 10. HR 110s. RR 30s with use of accessory muscles. Acute on chronically ill appearing elderly WF in severe respiratory distress. Lungs very congested, + crackles on exam. Audible congestion. Suctioned for pink-tinged frothy mucus.  Reg rhythm, tachy.  ++Daughter present who stated she is the oldest child. Pt does not have a living will or POA. Daughter says "they have never had a conversation" about intubation/CPR. Daughter wants everything done.  A/P: 1. Flash pulmonary edema, severe-total of 20mg  Metoprolol, Lasix 120mg , and MSO4 1mg  given. Foley ordered.  Pt's mental status began to decrease and pt was taken urgently to the ICU for intubation and phone consultation with PCCM. CXR to be done after intubation given urgency of situation. Report to Dr. Halford Chessman at bedside. Pt intubated and care assumed by PCCM.  2. CHF-reviewed chart-2014 echo with EF 45-50% and Grade I diastolic dysfunction. Further care per PCCM

## 2015-08-03 NOTE — Progress Notes (Signed)
RT called to room to give patient a breathing treatment. Upon arrival to room RN had given the breathing treatment.  Patient was having a hard time breathing and her O2 Sats were in the mid 80's.  RT changed patient from [place] to venturi mask of 50% with no improvement of O2 Sats.  RN contacted Rapid response and RT applied a nonrebreather mask and patient increased to 89-90%.  Patient was transported to 2M03 on NRB and zoll.

## 2015-08-04 ENCOUNTER — Inpatient Hospital Stay (HOSPITAL_COMMUNITY): Payer: Medicare Other

## 2015-08-04 ENCOUNTER — Encounter (HOSPITAL_COMMUNITY): Payer: Self-pay | Admitting: *Deleted

## 2015-08-04 DIAGNOSIS — J9601 Acute respiratory failure with hypoxia: Secondary | ICD-10-CM

## 2015-08-04 DIAGNOSIS — R0600 Dyspnea, unspecified: Secondary | ICD-10-CM

## 2015-08-04 LAB — CBC
HEMATOCRIT: 37.2 % (ref 36.0–46.0)
HEMOGLOBIN: 11.8 g/dL — AB (ref 12.0–15.0)
MCH: 30.6 pg (ref 26.0–34.0)
MCHC: 31.7 g/dL (ref 30.0–36.0)
MCV: 96.4 fL (ref 78.0–100.0)
Platelets: 139 10*3/uL — ABNORMAL LOW (ref 150–400)
RBC: 3.86 MIL/uL — ABNORMAL LOW (ref 3.87–5.11)
RDW: 13.3 % (ref 11.5–15.5)
WBC: 8.5 10*3/uL (ref 4.0–10.5)

## 2015-08-04 LAB — BASIC METABOLIC PANEL
ANION GAP: 11 (ref 5–15)
BUN: 29 mg/dL — ABNORMAL HIGH (ref 6–20)
CHLORIDE: 105 mmol/L (ref 101–111)
CO2: 26 mmol/L (ref 22–32)
CREATININE: 1.59 mg/dL — AB (ref 0.44–1.00)
Calcium: 9.1 mg/dL (ref 8.9–10.3)
GFR calc non Af Amer: 28 mL/min — ABNORMAL LOW (ref >60)
GFR, EST AFRICAN AMERICAN: 33 mL/min — AB (ref >60)
Glucose, Bld: 108 mg/dL — ABNORMAL HIGH (ref 65–99)
Potassium: 4.1 mmol/L (ref 3.5–5.1)
Sodium: 142 mmol/L (ref 135–145)

## 2015-08-04 LAB — GLUCOSE, CAPILLARY
GLUCOSE-CAPILLARY: 115 mg/dL — AB (ref 65–99)
Glucose-Capillary: 108 mg/dL — ABNORMAL HIGH (ref 65–99)
Glucose-Capillary: 107 mg/dL — ABNORMAL HIGH (ref 65–99)
Glucose-Capillary: 120 mg/dL — ABNORMAL HIGH (ref 65–99)
Glucose-Capillary: 101 mg/dL — ABNORMAL HIGH (ref 65–99)
Glucose-Capillary: 97 mg/dL (ref 65–99)

## 2015-08-04 LAB — HEPATIC FUNCTION PANEL
ALBUMIN: 2.9 g/dL — AB (ref 3.5–5.0)
ALK PHOS: 80 U/L (ref 38–126)
ALT: 27 U/L (ref 14–54)
AST: 32 U/L (ref 15–41)
BILIRUBIN DIRECT: 0.1 mg/dL (ref 0.1–0.5)
BILIRUBIN INDIRECT: 0.3 mg/dL (ref 0.3–0.9)
BILIRUBIN TOTAL: 0.4 mg/dL (ref 0.3–1.2)
Total Protein: 6.2 g/dL — ABNORMAL LOW (ref 6.5–8.1)

## 2015-08-04 LAB — TROPONIN I: TROPONIN I: 0.03 ng/mL (ref <0.031)

## 2015-08-04 LAB — CK: Total CK: 47 U/L (ref 38–234)

## 2015-08-04 LAB — PROTIME-INR
INR: 2.18 — ABNORMAL HIGH (ref 0.00–1.49)
Prothrombin Time: 24.1 seconds — ABNORMAL HIGH (ref 11.6–15.2)

## 2015-08-04 LAB — PROCALCITONIN: PROCALCITONIN: 1.51 ng/mL

## 2015-08-04 LAB — SEDIMENTATION RATE: SED RATE: 70 mm/h — AB (ref 0–22)

## 2015-08-04 MED ORDER — IPRATROPIUM-ALBUTEROL 0.5-2.5 (3) MG/3ML IN SOLN
3.0000 mL | RESPIRATORY_TRACT | Status: DC
  Administered 2015-08-04 – 2015-08-06 (×15): 3 mL via RESPIRATORY_TRACT
  Filled 2015-08-04 (×16): qty 3

## 2015-08-04 MED ORDER — FUROSEMIDE 10 MG/ML IJ SOLN
40.0000 mg | Freq: Every day | INTRAMUSCULAR | Status: DC
  Administered 2015-08-04 – 2015-08-08 (×5): 40 mg via INTRAVENOUS
  Filled 2015-08-04 (×7): qty 4

## 2015-08-04 MED ORDER — WARFARIN SODIUM 1 MG PO TABS
1.0000 mg | ORAL_TABLET | Freq: Once | ORAL | Status: AC
  Administered 2015-08-04: 1 mg via ORAL
  Filled 2015-08-04: qty 1

## 2015-08-04 NOTE — Progress Notes (Signed)
  Echocardiogram 2D Echocardiogram has been performed.  Diamond Nickel 08/04/2015, 11:34 AM

## 2015-08-04 NOTE — Progress Notes (Signed)
PULMONARY / CRITICAL CARE MEDICINE   Name: Jill Washington MRN: [id number] DOB: 1926/04/18    ADMISSION DATE:  08/01/2015 CONSULTATION DATE:  08/03/2015  REFERRING MD :  [facility]  CHIEF COMPLAINT:  Short of Breath  INITIAL PRESENTATION: 81 yo female admitted with productive cough, dyspnea, myalgias from CAP. Transferred to ICU 10/20 with VDRF.   STUDIES:    SIGNIFICANT EVENTS: 10/18 Admit 10/20 To ICU, VDRF  SUBJECTIVE:  Awake, intubated, follows commands.   VITAL SIGNS: Temp:  [99 F (37.2 C)-101.3 F (38.5 C)] 99.1 F (37.3 C) (10/21 0600) Pulse Rate:  [41-78] 70 (10/21 0750) Resp:  [16-32] 26 (10/21 0750) BP: (106-145)/(41-104) 135/56 mmHg (10/21 0750) SpO2:  [96 %-100 %] 100 % (10/21 0750) FiO2 (%):  [40 %-100 %] 40 % (10/21 0750) Weight:  [201 lb 4.5 oz (91.3 kg)] 201 lb 4.5 oz (91.3 kg) (10/21 0446) HEMODYNAMICS:   VENTILATOR SETTINGS: Vent Mode:  [-] PSV;CPAP FiO2 (%):  [40 %-100 %] 40 % Set Rate:  [18 bmp] 18 bmp Vt Set:  [490 mL] 490 mL PEEP:  [5 cmH20] 5 cmH20 Pressure Support:  [10 cmH20] 10 cmH20 Plateau Pressure:  [14 cmH20-23 cmH20] 14 cmH20 INTAKE / OUTPUT:  Intake/Output Summary (Last 24 hours) at 08/04/15 0758 Last data filed at 08/04/15 0600  Gross per 24 hour  Intake 1684.58 ml  Output   2235 ml  Net -550.42 ml    PHYSICAL EXAMINATION: General:  81yo female intubated Neuro:  Awake, follows commands HEENT:  Dry mucus membranes Cardiovascular:  RRR, 2/6 systolic murmur Lungs:  Bilateral crackles, ronchii throughout  Abdomen:  S, NT, ND Musculoskeletal:  1+ edema Skin:  Chronic venous stasis changes  LABS:  CBC  Recent Labs Lab 08/02/15 0426 08/03/15 0725 08/04/15 0333  WBC 16.9* 15.1* 8.5  HGB 12.0 12.0 11.8*  HCT 36.5 37.0 37.2  PLT 163 167 139*   Coag's  Recent Labs Lab 08/02/15 0426 08/03/15 0742 08/04/15 0333  INR 1.84* 2.10* 2.18*   BMET  Recent Labs Lab 08/02/15 0426 08/03/15 0725 08/04/15 0333  NA 141  138 142  K 3.9 4.0 4.1  CL 105 108 105  CO2 21* 22 26  BUN 23* 28* 29*  CREATININE 1.77* 1.67* 1.59*  GLUCOSE 235* 164* 108*   Electrolytes  Recent Labs Lab 08/01/15 2352 08/02/15 0426 08/03/15 0725 08/04/15 0333  CALCIUM  --  9.5 8.9 9.1  MG 1.5*  --  2.1  --   PHOS 3.3  --   --   --    Sepsis Markers  Recent Labs Lab 08/01/15 2029 08/03/15 0725 08/03/15 0838 08/04/15 0333  LATICACIDVEN 2.15*  --  1.5  --   PROCALCITON  --  0.10  --  1.51   ABG  Recent Labs Lab 08/03/15 0845  PHART 7.253*  PCO2ART 53.7*  PO2ART 81.6   Liver Enzymes  Recent Labs Lab 08/02/15 0426 08/03/15 0725  AST 53* 48*  ALT 28 35  ALKPHOS 45 46  BILITOT 1.0 0.4  ALBUMIN 3.5 3.0*   Cardiac Enzymes  Recent Labs Lab 08/03/15 0725 08/03/15 1430 08/03/15 2040  TROPONINI 0.07* 0.04* 0.03   Glucose  Recent Labs Lab 08/03/15 0744 08/03/15 1152 08/03/15 1525 08/03/15 1932 08/03/15 2353 08/04/15 0335  GLUCAP 156* 70 79 86 108* 115*    Imaging Dg Chest Port 1 View  08/04/2015  CLINICAL DATA:  Community acquired pneumonia. EXAM: PORTABLE CHEST 1 VIEW COMPARISON:  Chest radiograph from one day prior.  FINDINGS: Endotracheal tube tip is 3.9 cm above the carina. Enteric tube enters the stomach with the tip not seen on this image. Stable of do lead right subclavian pacemaker with lead tips overlying the right atrium and right ventricle. Stable cardiomediastinal silhouette with normal heart size. No pneumothorax. Possible new trace right pleural effusion. No left pleural effusion. Patchy consolidation is noted in the bilateral mid lungs, slightly worse on the left. IMPRESSION: 1. Well-positioned lines and tubes. 2. Stable patchy consolidation in the bilateral mid lungs, slightly worse on the left, likely multifocal pneumonia. Electronically Signed   By: Ilona Sorrel M.D.   On: 08/04/2015 07:34     ASSESSMENT / PLAN:  PULMONARY  ETT 10/20 >>  A:  Acute respiratory failure 2nd to  CAP and acute pulmonary edema.  P:  Full vent support, wean as tolerated F/u CXR, ABG  D/c pulmicort  PRN BDs   CARDIOVASCULAR  A:  Acute on chronic diastolic/systolic CHF.  Hx of A fib on amiodarone, coumadin.  Hx of HTN, Complete heart block s/p PM, HLD.  P:  Coumadin per pharmacy  Continue amiodarone, pravachol  Lopressor IV prn for HR > 120  Hold outpt tenormin, lasix, valsartan, HCTZ  F/u Echo,   RENAL  A:  CKD stage 3.  Overactive bladder.  Hypomagnesemia.  P:  Monitor renal fx, urine outpt  Replace electrolytes as needed  Hold outpt mirabegron   GASTROINTESTINAL  A:  Nutrition.  Hx of HH, GERD, IBS.  P:  Tube feeds  Protonix   HEMATOLOGIC  A:  Leukocytosis.  P:  F/u CBC   INFECTIOUS  A:  Sepsis with CAP.  P:   vanc 10/20 >>> Rocephin 10/19 >>> Azithromycin 10/19 >>>  F/u lactic acid, procalcitonin  Blood 10/18 >>  Pneumococcal Ag 10/18 >> negative  Legionella Ag 10/18 >>  Sputum 10/20 >>   ENDOCRINE  A:  Hx of DM with peripheral neuropathy.  Hx of hypothyroidism.  P:  SSI  Continue synthroid   NEUROLOGIC  A:  Hx of fibromyalgia.  P:  RASS goal: -1  PRN versed, fentanyl  Hold outpt neurontin   Floyd Wade M. Jerline Pain, [place] [facility] Resident PGY-2 08/04/2015 7:58 AM

## 2015-08-04 NOTE — Care Management Important Message (Signed)
Important Message  Patient Details  Name: Jill Washington MRN: [id number] Date of Birth: [birth year]   Medicare Important Message Given:  Yes-second notification given    Nathen May 08/04/2015, 11:50 AM

## 2015-08-04 NOTE — Progress Notes (Signed)
[place] for warfarin Indication: atrial fibrillation  Allergies  Allergen Reactions  . Cymbalta [Duloxetine Hcl] Other (See Comments)    Could not think straight  . Iodinated Diagnostic Agents Other (See Comments)    Pt was not told what the reaction was  . Other Itching    Dial Soap    Patient Measurements: Height: 5\' 7"  (170.2 cm) Weight: 201 lb 4.5 oz (91.3 kg) IBW/kg (Calculated) : 61.6  Vital Signs: Temp: 99.1 F (37.3 C) (10/21 0600) Temp Source: Core (Comment) (10/21 0000) BP: 135/56 mmHg (10/21 0750) Pulse Rate: 70 (10/21 0750)  Labs:  Recent Labs  08/02/15 0426 08/03/15 0725 08/03/15 0742 08/03/15 1430 08/03/15 2040 08/04/15 0333  HGB 12.0 12.0  --   --   --  11.8*  HCT 36.5 37.0  --   --   --  37.2  PLT 163 167  --   --   --  139*  LABPROT 21.2*  --  23.4*  --   --  24.1*  INR 1.84*  --  2.10*  --   --  2.18*  CREATININE 1.77* 1.67*  --   --   --  1.59*  TROPONINI  --  0.07*  --  0.04* 0.03  --     Estimated Creatinine Clearance: 28.9 mL/min (by C-G formula based on Cr of 1.59).   Assessment: 81 yo F to be continued on home warfarin for afib.  Warfarin PTA dose = 1 mg every day except 2 mg on Mondays (8 mg weekly). Last dose PTA was 10/18 prior to coming to the hospital.  INR 2.18 this morning. hgb 11.8, plts 139 - stable, no bleeding noted.   Goal of Therapy:  INR 2-3 Monitor platelets by anticoagulation protocol: Yes   Plan:  -warfarin 1mg  po x1 tonight -daily INR -follow for s/s bleeding -follow for any effect antibiotics (azithromycin and ceftriaxone) may have on INR along with antibiotic LOT  Melburn Popper, PharmD Clinical Pharmacy Resident Pager: [phone] 08/04/2015 8:19 AM

## 2015-08-04 NOTE — Progress Notes (Signed)
Sputum sample obtained and sent to lab by RT. 

## 2015-08-05 DIAGNOSIS — J9601 Acute respiratory failure with hypoxia: Secondary | ICD-10-CM

## 2015-08-05 LAB — MAGNESIUM: MAGNESIUM: 2 mg/dL (ref 1.7–2.4)

## 2015-08-05 LAB — BASIC METABOLIC PANEL
Anion gap: 8 (ref 5–15)
BUN: 38 mg/dL — AB (ref 6–20)
CHLORIDE: 101 mmol/L (ref 101–111)
CO2: 28 mmol/L (ref 22–32)
Calcium: 8.4 mg/dL — ABNORMAL LOW (ref 8.9–10.3)
Creatinine, Ser: 1.43 mg/dL — ABNORMAL HIGH (ref 0.44–1.00)
GFR calc Af Amer: 37 mL/min — ABNORMAL LOW (ref >60)
GFR calc non Af Amer: 32 mL/min — ABNORMAL LOW (ref >60)
Glucose, Bld: 136 mg/dL — ABNORMAL HIGH (ref 65–99)
POTASSIUM: 3.4 mmol/L — AB (ref 3.5–5.1)
SODIUM: 137 mmol/L (ref 135–145)

## 2015-08-05 LAB — CBC
HCT: 29.8 % — ABNORMAL LOW (ref 36.0–46.0)
HEMOGLOBIN: 9.8 g/dL — AB (ref 12.0–15.0)
MCH: 31.4 pg (ref 26.0–34.0)
MCHC: 32.9 g/dL (ref 30.0–36.0)
MCV: 95.5 fL (ref 78.0–100.0)
Platelets: 131 10*3/uL — ABNORMAL LOW (ref 150–400)
RBC: 3.12 MIL/uL — AB (ref 3.87–5.11)
RDW: 13.1 % (ref 11.5–15.5)
WBC: 7.8 10*3/uL (ref 4.0–10.5)

## 2015-08-05 LAB — PROTIME-INR
INR: 2.01 — AB (ref 0.00–1.49)
Prothrombin Time: 22.7 seconds — ABNORMAL HIGH (ref 11.6–15.2)

## 2015-08-05 LAB — GLUCOSE, CAPILLARY
GLUCOSE-CAPILLARY: 97 mg/dL (ref 65–99)
GLUCOSE-CAPILLARY: 89 mg/dL (ref 65–99)
Glucose-Capillary: 114 mg/dL — ABNORMAL HIGH (ref 65–99)
Glucose-Capillary: 93 mg/dL (ref 65–99)

## 2015-08-05 LAB — CULTURE, RESPIRATORY

## 2015-08-05 LAB — CULTURE, RESPIRATORY W GRAM STAIN: Culture: NORMAL

## 2015-08-05 LAB — PHOSPHORUS: PHOSPHORUS: 3.4 mg/dL (ref 2.5–4.6)

## 2015-08-05 LAB — PROCALCITONIN: Procalcitonin: 1.09 ng/mL

## 2015-08-05 MED ORDER — CETYLPYRIDINIUM CHLORIDE 0.05 % MT LIQD
7.0000 mL | Freq: Two times a day (BID) | OROMUCOSAL | Status: DC
  Administered 2015-08-05 – 2015-08-10 (×7): 7 mL via OROMUCOSAL

## 2015-08-05 MED ORDER — CHLORHEXIDINE GLUCONATE 0.12 % MT SOLN
15.0000 mL | Freq: Two times a day (BID) | OROMUCOSAL | Status: DC
  Administered 2015-08-05 – 2015-08-10 (×9): 15 mL via OROMUCOSAL
  Filled 2015-08-05 (×7): qty 15

## 2015-08-05 MED ORDER — BUDESONIDE 0.25 MG/2ML IN SUSP
0.2500 mg | Freq: Two times a day (BID) | RESPIRATORY_TRACT | Status: DC
  Administered 2015-08-05 – 2015-08-10 (×11): 0.25 mg via RESPIRATORY_TRACT
  Filled 2015-08-05 (×13): qty 2

## 2015-08-05 MED ORDER — WARFARIN SODIUM 1 MG PO TABS
1.0000 mg | ORAL_TABLET | Freq: Once | ORAL | Status: AC
  Administered 2015-08-05: 1 mg via ORAL
  Filled 2015-08-05: qty 1

## 2015-08-05 MED ORDER — POTASSIUM CHLORIDE 20 MEQ/15ML (10%) PO SOLN
40.0000 meq | Freq: Once | ORAL | Status: AC
  Administered 2015-08-05: 40 meq
  Filled 2015-08-05: qty 30

## 2015-08-05 NOTE — Progress Notes (Signed)
Meets extubation criteria  Plan Extubate husband updated  Dr. Brand Males, M.D., [facility]C.P Pulmonary and Critical Care Medicine Staff Physician [place] [facility] Pager: [phone], If no answer or between  15:00h - 7:00h: call [phone]  08/05/2015 10:26 AM

## 2015-08-05 NOTE — Progress Notes (Signed)
PULMONARY / CRITICAL CARE MEDICINE   Name: Jill Washington MRN: [id number] DOB: October 04, 1926    ADMISSION DATE:  08/01/2015 CONSULTATION DATE:  08/03/2015  REFERRING MD :  [facility]  CHIEF COMPLAINT:  Short of Breath  INITIAL PRESENTATION: 81 yo female admitted with productive cough, dyspnea, myalgias from CAP. Transferred to ICU 10/20 with VDRF.   STUDIES:    SIGNIFICANT EVENTS: 10/18 Admit 10/20 To ICU, VDRF 08/04/15: Awake, intubated, follows commands.    SUBJECTIVE/OVERNIGHT/INTERVAL HX 08/05/15: She is on when necessary sedation and has normal wake up assessment. Meets spontaneous breathing trial criteria. Chest x-ray with infiltrates. Echo with ejection fraction 55% and improved compared to before but ventricle septum has paradoxical motion but right ventricle is only mildly dilated.  VITAL SIGNS: Temp:  [99.3 F (37.4 C)-100.2 F (37.9 C)] 99.3 F (37.4 C) (10/22 0600) Pulse Rate:  [70-90] 74 (10/22 0731) Resp:  [12-32] 22 (10/22 0731) BP: (93-176)/(37-105) 144/105 mmHg (10/22 0731) SpO2:  [95 %-100 %] 100 % (10/22 0731) FiO2 (%):  [40 %-50 %] 40 % (10/22 0731) Weight:  [91.4 kg (201 lb 8 oz)] 91.4 kg (201 lb 8 oz) (10/22 0427) HEMODYNAMICS:   VENTILATOR SETTINGS: Vent Mode:  [-] PRVC FiO2 (%):  [40 %-50 %] 40 % Set Rate:  [18 bmp] 18 bmp Vt Set:  [490 mL] 490 mL PEEP:  [5 cmH20] 5 cmH20 Plateau Pressure:  [17 cmH20-23 cmH20] 17 cmH20 INTAKE / OUTPUT:  Intake/Output Summary (Last 24 hours) at 08/05/15 0810 Last data filed at 08/05/15 0545  Gross per 24 hour  Intake   1557 ml  Output   1550 ml  Net      7 ml    PHYSICAL EXAMINATION: General:  81yo female intubated Neuro:  Awake, follows commands. No evidence of delirium. Follows all commands. Writing on a piece of paper. On when necessary sedation HEENT:  Dry mucus membranes Cardiovascular:  RRR, 2/6 systolic murmur Lungs:  Bilateral crackles, ronchii throughout -this is improved 08/05/2015 with only  scant wheeze Abdomen:  S, NT, ND Musculoskeletal:  1+ edema Skin:  Chronic venous stasis changes  LABS:  CBC  Recent Labs Lab 08/03/15 0725 08/04/15 0333 08/05/15 0542  WBC 15.1* 8.5 7.8  HGB 12.0 11.8* 9.8*  HCT 37.0 37.2 29.8*  PLT 167 139* 131*   Coag's  Recent Labs Lab 08/03/15 0742 08/04/15 0333 08/05/15 0542  INR 2.10* 2.18* 2.01*   BMET  Recent Labs Lab 08/03/15 0725 08/04/15 0333 08/05/15 0542  NA 138 142 137  K 4.0 4.1 3.4*  CL 108 105 101  CO2 22 26 28   BUN 28* 29* 38*  CREATININE 1.67* 1.59* 1.43*  GLUCOSE 164* 108* 136*   Electrolytes  Recent Labs Lab 08/01/15 2352  08/03/15 0725 08/04/15 0333 08/05/15 0542  CALCIUM  --   < > 8.9 9.1 8.4*  MG 1.5*  --  2.1  --  2.0  PHOS 3.3  --   --   --  3.4  < > = values in this interval not displayed. Sepsis Markers  Recent Labs Lab 08/01/15 2029 08/03/15 0725 08/03/15 0838 08/04/15 0333 08/05/15 0542  LATICACIDVEN 2.15*  --  1.5  --   --   PROCALCITON  --  0.10  --  1.51 1.09   ABG  Recent Labs Lab 08/03/15 0845  PHART 7.253*  PCO2ART 53.7*  PO2ART 81.6   Liver Enzymes  Recent Labs Lab 08/02/15 0426 08/03/15 0725 08/04/15 0333  AST 53* 48*  32  ALT 28 35 27  ALKPHOS 45 46 80  BILITOT 1.0 0.4 0.4  ALBUMIN 3.5 3.0* 2.9*   Cardiac Enzymes  Recent Labs Lab 08/03/15 0725 08/03/15 1430 08/03/15 2040  TROPONINI 0.07* 0.04* 0.03   Glucose  Recent Labs Lab 08/03/15 2353 08/04/15 0335 08/04/15 0744 08/04/15 1132 08/04/15 1554 08/04/15 1914  GLUCAP 108* 115* 107* 120* 97 101*    Imaging No results found.   ASSESSMENT / PLAN:  PULMONARY  ETT 10/20 >>  A:  Acute respiratory failure 2nd to CAP and acute pulmonary edema.   - Failed spontaneous breathing trial 08/04/2015 due to infiltrates. On 08/05/2015 meets weaning criteria again P: \ SBT as tolerated - extubate if meets criteria Full vent support, wean as tolerated F/u CXR, ABG    CARDIOVASCULAR  A:   Acute on chronic diastolic/systolic CHF.  Hx of A fib on amiodarone since 2014, coumadin.  Hx of HTN, Complete heart block s/p PM, HLD.     - Maintaining hemodynamics. Echocardiogram 08/04/2015 with normal left ventricle ejection fraction but paradoxical septal wall motion of unclear etiology  P:  Coumadin per pharmacy  Continue amiodarone, pravachol -  if unimproved then start steroids for possible amiodarone toxicity Lopressor IV prn for HR > 120  Hold outpt tenormin, lasix, valsartan, HCTZ    RENAL  A:  CKD stage 3 with Overactive bladder.  Acute kidney injury at presentation   - 08/05/2015: Acute kidney injury improving. Has mild hypokalemia .  P:  Continue daily Lasix Oral potassium replacement  Monitor renal fx, urine outpt  Replace electrolytes as needed  Hold outpt mirabegron   GASTROINTESTINAL  A:  Nutrition.  Hx of HH, GERD, IBS.  P:  Tube feeds  Protonix   HEMATOLOGIC  A:  Leukocytosis.  P:  F/u CBC   INFECTIOUS  Results for orders placed or performed during the hospital encounter of 08/01/15  Culture, blood (routine x 2) Call MD if unable to obtain prior to antibiotics being given     Status: None (Preliminary result)   Collection Time: 08/01/15 11:52 PM  Result Value Ref Range Status   Specimen Description BLOOD LEFT ANTECUBITAL  Final   Special Requests BOTTLES DRAWN AEROBIC AND ANAEROBIC 5CC  Final   Culture NO GROWTH 1 DAY  Final   Report Status PENDING  Incomplete  Culture, blood (routine x 2) Call MD if unable to obtain prior to antibiotics being given     Status: None (Preliminary result)   Collection Time: 08/01/15 11:57 PM  Result Value Ref Range Status   Specimen Description BLOOD LEFT ANTECUBITAL  Final   Special Requests BOTTLES DRAWN AEROBIC AND ANAEROBIC 5CC  Final   Culture NO GROWTH 1 DAY  Final   Report Status PENDING  Incomplete  Culture, sputum-assessment     Status: None   Collection Time: 08/02/15  1:08 PM  Result Value Ref  Range Status   Specimen Description EXPECTORATED SPUTUM  Final   Special Requests NONE  Final   Sputum evaluation   Final    THIS SPECIMEN IS ACCEPTABLE. RESPIRATORY CULTURE REPORT TO FOLLOW.   Report Status 08/02/2015 FINAL  Final  Culture, respiratory (NON-Expectorated)     Status: None   Collection Time: 08/02/15  1:08 PM  Result Value Ref Range Status   Specimen Description SPUTUM  Final   Special Requests NONE  Final   Gram Stain   Final    MODERATE WBC PRESENT, PREDOMINANTLY PMN FEW SQUAMOUS EPITHELIAL  CELLS PRESENT RARE GRAM POSITIVE RODS RARE GRAM NEGATIVE RODS RARE YEAST    Culture   Final    NORMAL OROPHARYNGEAL FLORA Performed at [facility]    Report Status 08/05/2015 FINAL  Final  MRSA PCR Screening     Status: None   Collection Time: 08/03/15  7:30 AM  Result Value Ref Range Status   MRSA by PCR NEGATIVE NEGATIVE Final    Comment:        The GeneXpert MRSA Assay (FDA approved for NASAL specimens only), is one component of a comprehensive MRSA colonization surveillance program. It is not intended to diagnose MRSA infection nor to guide or monitor treatment for MRSA infections.   Culture, respiratory (NON-Expectorated)     Status: None (Preliminary result)   Collection Time: 08/03/15  8:48 AM  Result Value Ref Range Status   Specimen Description ENDOTRACHEAL  Final   Special Requests NONE  Final   Gram Stain   Final    ABUNDANT WBC PRESENT,BOTH PMN AND MONONUCLEAR RARE SQUAMOUS EPITHELIAL CELLS PRESENT NO ORGANISMS SEEN Performed at [facility]    Culture   Final    Culture reincubated for better growth Performed at [facility]    Report Status PENDING  Incomplete  Culture, respiratory (NON-Expectorated)     Status: None (Preliminary result)   Collection Time: 08/04/15 11:35 AM  Result Value Ref Range Status   Specimen Description TRACHEAL ASPIRATE  Final   Special Requests NONE  Final   Gram Stain PENDING  Incomplete    Culture   Final    NO GROWTH 1 DAY Performed at [facility]    Report Status PENDING  Incomplete    A:  Sepsis with CAP.  P:   vanc 10/20 >>> 08/05/2015  Rocephin 10/19 >>> Azithromycin 10/19 >>>  F/u lactic acid, procalcitonin  Blood 10/18 >>  Pneumococcal Ag 10/18 >> negative  Legionella Ag 10/18 >>  Sputum 10/20 >>   ENDOCRINE  A:  Hx of DM with peripheral neuropathy.  Hx of hypothyroidism.  P:  SSI  Continue synthroid   NEUROLOGIC  A:  Hx of fibromyalgia.   - Normal wake-up assessment on when necessary fentanyl  P:  RASS goal: -1  PRN versed, fentanyl  Hold outpt neurontin     global  08/05/2015: If meets spontaneous breathing trial criteria will extubate to BiPAP otherwise continue full mechanical ventilator support. If no improvement in 24-48 hours consider steroids for possible amiodarone toxicity      The patient is critically ill with multiple organ systems failure and requires high complexity decision making for assessment and support, frequent evaluation and titration of therapies, application of advanced monitoring technologies and extensive interpretation of multiple databases.   Critical Care Time devoted to patient care services described in this note is  30  Minutes. This time reflects time of care of this signee Dr Brand Males. This critical care time does not reflect procedure time, or teaching time or supervisory time of PA/NP/Med student/Med Resident etc but could involve care discussion time    Dr. Brand Males, M.D., [facility].C.P Pulmonary and Critical Care Medicine Staff Physician [facility] Pager: [phone], If no answer or between  15:00h - 7:00h: call [phone]  08/05/2015 8:19 AM

## 2015-08-05 NOTE — Progress Notes (Signed)
[place] for warfarin Indication: atrial fibrillation  Allergies  Allergen Reactions  . Cymbalta [Duloxetine Hcl] Other (See Comments)    Could not think straight  . Iodinated Diagnostic Agents Other (See Comments)    Pt was not told what the reaction was  . Other Itching    Dial Soap    Patient Measurements: Height: 5\' 7"  (170.2 cm) Weight: 201 lb 8 oz (91.4 kg) IBW/kg (Calculated) : 61.6  Vital Signs: Temp: 98.9 F (37.2 C) (10/22 1000) BP: 131/52 mmHg (10/22 1000) Pulse Rate: 74 (10/22 1000)  Labs:  Recent Labs  08/03/15 0725 08/03/15 0742 08/03/15 1430 08/03/15 2040 08/04/15 0333 08/05/15 0542  HGB 12.0  --   --   --  11.8* 9.8*  HCT 37.0  --   --   --  37.2 29.8*  PLT 167  --   --   --  139* 131*  LABPROT  --  23.4*  --   --  24.1* 22.7*  INR  --  2.10*  --   --  2.18* 2.01*  CREATININE 1.67*  --   --   --  1.59* 1.43*  CKTOTAL  --   --   --   --  47  --   TROPONINI 0.07*  --  0.04* 0.03  --   --     Estimated Creatinine Clearance: 32.2 mL/min (by C-G formula based on Cr of 1.43).   Assessment: 81 yo F to be continued on home warfarin for afib.  Warfarin PTA dose = 1 mg every day except 2 mg on Mondays (8 mg weekly). Last dose PTA was 10/18 prior to coming to the hospital.  INR 2.01 this morning. Remains therapeutic. Hgb 9.8 - down 2g today but no bleeding noted.  Plts 131 - stable.  Goal of Therapy:  INR 2-3 Monitor platelets by anticoagulation protocol: Yes   Plan:  -warfarin 1mg  po x1 tonight -daily INR -follow for s/s bleeding. Watch Hgb closely. -follow for any effect antibiotics (azithromycin and ceftriaxone) may have on INR along with antibiotic LOT  Legrand Como, Pharm.D., BCPS, AAHIVP Clinical Pharmacist Phone: [phone] or [phone] 08/05/2015, 11:01 AM

## 2015-08-05 NOTE — Progress Notes (Signed)
Attempted to place patient on Bipap, she ripped the mask off, shouting that she couldn't do this. Patient is stable at this time. RT will continue to monitor.

## 2015-08-05 NOTE — Procedures (Signed)
Extubation Procedure Note  Patient Details:   Name: Jill Washington DOB: Aug 05, 1926 MRN: [id number]   Airway Documentation:     Evaluation  O2 sats: stable throughout Complications: No apparent complications Patient did tolerate procedure well. Bilateral Breath Sounds: Expiratory wheezes, Diminished Suctioning: Oral, Airway Yes   Pt extubated to NIV PS/Bipap PS 5/Peep 5 fio2 40%, per verbal order Dr Chase Caller. Pt able to speak name and has good cough post extubation. Pt with copious oral thick yellow/clear secretions after extubation. Pt has LUL diminished BS, RUL expiratory wheeze, with fine crackles bilaterally. RT will continue to monitor.   Jesse Sans 08/05/2015, 10:42 AM

## 2015-08-06 ENCOUNTER — Other Ambulatory Visit: Payer: Self-pay | Admitting: Family Medicine

## 2015-08-06 ENCOUNTER — Inpatient Hospital Stay (HOSPITAL_COMMUNITY): Payer: Medicare Other

## 2015-08-06 DIAGNOSIS — B348 Other viral infections of unspecified site: Secondary | ICD-10-CM

## 2015-08-06 LAB — RESPIRATORY VIRUS PANEL
Adenovirus: NEGATIVE
Influenza A: NEGATIVE
Influenza B: NEGATIVE
Metapneumovirus: NEGATIVE
PARAINFLUENZA 1 A: NEGATIVE
PARAINFLUENZA 2 A: NEGATIVE
PARAINFLUENZA 3 A: NEGATIVE
RESPIRATORY SYNCYTIAL VIRUS B: NEGATIVE
RHINOVIRUS: POSITIVE — AB
Respiratory Syncytial Virus A: NEGATIVE

## 2015-08-06 LAB — CBC
HCT: 33.3 % — ABNORMAL LOW (ref 36.0–46.0)
HEMOGLOBIN: 10.7 g/dL — AB (ref 12.0–15.0)
MCH: 30.7 pg (ref 26.0–34.0)
MCHC: 32.1 g/dL (ref 30.0–36.0)
MCV: 95.7 fL (ref 78.0–100.0)
PLATELETS: 167 10*3/uL (ref 150–400)
RBC: 3.48 MIL/uL — AB (ref 3.87–5.11)
RDW: 12.8 % (ref 11.5–15.5)
WBC: 8.7 10*3/uL (ref 4.0–10.5)

## 2015-08-06 LAB — PROTIME-INR
INR: 1.77 — ABNORMAL HIGH (ref 0.00–1.49)
PROTHROMBIN TIME: 20.6 s — AB (ref 11.6–15.2)

## 2015-08-06 LAB — BASIC METABOLIC PANEL
ANION GAP: 12 (ref 5–15)
BUN: 30 mg/dL — ABNORMAL HIGH (ref 6–20)
CALCIUM: 9.1 mg/dL (ref 8.9–10.3)
CO2: 27 mmol/L (ref 22–32)
Chloride: 102 mmol/L (ref 101–111)
Creatinine, Ser: 1.29 mg/dL — ABNORMAL HIGH (ref 0.44–1.00)
GFR, EST AFRICAN AMERICAN: 42 mL/min — AB (ref >60)
GFR, EST NON AFRICAN AMERICAN: 36 mL/min — AB (ref >60)
Glucose, Bld: 148 mg/dL — ABNORMAL HIGH (ref 65–99)
Potassium: 4.5 mmol/L (ref 3.5–5.1)
SODIUM: 141 mmol/L (ref 135–145)

## 2015-08-06 LAB — CULTURE, RESPIRATORY: GRAM STAIN: NONE SEEN

## 2015-08-06 LAB — GLUCOSE, CAPILLARY
GLUCOSE-CAPILLARY: 109 mg/dL — AB (ref 65–99)
GLUCOSE-CAPILLARY: 111 mg/dL — AB (ref 65–99)
GLUCOSE-CAPILLARY: 94 mg/dL (ref 65–99)
Glucose-Capillary: 102 mg/dL — ABNORMAL HIGH (ref 65–99)

## 2015-08-06 LAB — CULTURE, RESPIRATORY W GRAM STAIN: Culture: NO GROWTH

## 2015-08-06 LAB — PHOSPHORUS: PHOSPHORUS: 3.1 mg/dL (ref 2.5–4.6)

## 2015-08-06 LAB — MAGNESIUM: MAGNESIUM: 1.9 mg/dL (ref 1.7–2.4)

## 2015-08-06 MED ORDER — ALBUTEROL SULFATE (2.5 MG/3ML) 0.083% IN NEBU: 2.5000 mg | INHALATION_SOLUTION | RESPIRATORY_TRACT | Status: DC | PRN

## 2015-08-06 MED ORDER — WARFARIN SODIUM 2 MG PO TABS
2.0000 mg | ORAL_TABLET | Freq: Once | ORAL | Status: AC
  Administered 2015-08-06: 2 mg via ORAL
  Filled 2015-08-06: qty 1

## 2015-08-06 MED ORDER — PANTOPRAZOLE SODIUM 40 MG PO TBEC
40.0000 mg | DELAYED_RELEASE_TABLET | Freq: Every day | ORAL | Status: DC
  Administered 2015-08-06 – 2015-08-10 (×5): 40 mg via ORAL
  Filled 2015-08-06 (×5): qty 1

## 2015-08-06 MED ORDER — IPRATROPIUM-ALBUTEROL 0.5-2.5 (3) MG/3ML IN SOLN
3.0000 mL | Freq: Three times a day (TID) | RESPIRATORY_TRACT | Status: DC
  Administered 2015-08-07 – 2015-08-10 (×11): 3 mL via RESPIRATORY_TRACT
  Filled 2015-08-06 (×11): qty 3

## 2015-08-06 NOTE — Progress Notes (Signed)
PULMONARY / CRITICAL CARE MEDICINE   Name: Jill Washington MRN: [id number] DOB: Dec 18, 1925    ADMISSION DATE:  08/01/2015 CONSULTATION DATE:  08/03/2015  REFERRING MD :  [facility]  CHIEF COMPLAINT:  Short of Breath  INITIAL PRESENTATION: 80 yo female admitted with productive cough, dyspnea, myalgias from CAP. Transferred to ICU 10/20 with VDRF.   STUDIES:    SIGNIFICANT EVENTS: 10/18 Admit 10/20 To ICU, VDRF 08/04/15: Awake, intubated, follows commands.    08/05/15: She is on when necessary sedation and has normal wake up assessment. Meets spontaneous breathing trial criteria. Chest x-ray with infiltrates. Echo with ejection fraction 55% and improved compared to before but ventricle septum has paradoxical motion but right ventricle is only mildly dilated.   SUBJECTIVE/OVERNIGHT/INTERVAL HX 08/06/2015: Extubated yesterday. Did not tolerate BiPAP. Currently doing nasal oxygen.  RN patient stable to go to the floor. Wheezing improves with nebulizers. Rhinovirus positive on PCR. She had lunch and endorses no complaints  VITAL SIGNS: Temp:  [98.8 F (37.1 C)-99.8 F (37.7 C)] 98.8 F (37.1 C) (10/23 1246) Pulse Rate:  [71-106] 86 (10/23 1300) Resp:  [17-30] 22 (10/23 1300) BP: (115-173)/(36-73) 159/52 mmHg (10/23 1300) SpO2:  [89 %-100 %] 97 % (10/23 1300) Weight:  [90.1 kg (198 lb 10.2 oz)] 90.1 kg (198 lb 10.2 oz) (10/23 0600) HEMODYNAMICS:   VENTILATOR SETTINGS:   INTAKE / OUTPUT:  Intake/Output Summary (Last 24 hours) at 08/06/15 1409 Last data filed at 08/06/15 1200  Gross per 24 hour  Intake    680 ml  Output   1575 ml  Net   -895 ml    PHYSICAL EXAMINATION: General:  81yo female intubated. Looks mildly deconditioned Neuro:  Awake, follows commands. No evidence of delirium. Follows all commands. Writing on a piece of paper.  HEENT:  Dry mucus membranes Cardiovascular:  RRR, 2/6 systolic murmur Lungs:  Bilateral crackles, ronchii throughout -this is improved  08/06/2015 with only scant wheeze Abdomen:  S, NT, ND Musculoskeletal:  1+ edema Skin:  Chronic venous stasis changes  LABS:  CBC  Recent Labs Lab 08/04/15 0333 08/05/15 0542 08/06/15 1130  WBC 8.5 7.8 8.7  HGB 11.8* 9.8* 10.7*  HCT 37.2 29.8* 33.3*  PLT 139* 131* 167   Coag's  Recent Labs Lab 08/04/15 0333 08/05/15 0542 08/06/15 1130  INR 2.18* 2.01* 1.77*   BMET  Recent Labs Lab 08/04/15 0333 08/05/15 0542 08/06/15 1130  NA 142 137 141  K 4.1 3.4* 4.5  CL 105 101 102  CO2 26 28 27   BUN 29* 38* 30*  CREATININE 1.59* 1.43* 1.29*  GLUCOSE 108* 136* 148*   Electrolytes  Recent Labs Lab 08/01/15 2352  08/03/15 0725 08/04/15 0333 08/05/15 0542 08/06/15 1130  CALCIUM  --   < > 8.9 9.1 8.4* 9.1  MG 1.5*  --  2.1  --  2.0 1.9  PHOS 3.3  --   --   --  3.4 3.1  < > = values in this interval not displayed. Sepsis Markers  Recent Labs Lab 08/01/15 2029 08/03/15 0725 08/03/15 0838 08/04/15 0333 08/05/15 0542  LATICACIDVEN 2.15*  --  1.5  --   --   PROCALCITON  --  0.10  --  1.51 1.09   ABG  Recent Labs Lab 08/03/15 0845  PHART 7.253*  PCO2ART 53.7*  PO2ART 81.6   Liver Enzymes  Recent Labs Lab 08/02/15 0426 08/03/15 0725 08/04/15 0333  AST 53* 48* 32  ALT 28 35 27  ALKPHOS 45  46 80  BILITOT 1.0 0.4 0.4  ALBUMIN 3.5 3.0* 2.9*   Cardiac Enzymes  Recent Labs Lab 08/03/15 0725 08/03/15 1430 08/03/15 2040  TROPONINI 0.07* 0.04* 0.03   Glucose  Recent Labs Lab 08/05/15 1151 08/05/15 1614 08/05/15 2011 08/05/15 2358 08/06/15 0749 08/06/15 1245  GLUCAP 97 89 93 109* 94 111*    Imaging Dg Chest Port 1 View  08/06/2015  CLINICAL DATA:  Endotracheal tube, hypertension, former smoker, atrial fibrillation EXAM: PORTABLE CHEST 1 VIEW COMPARISON:  Portable exam 0513 hours compared to 08/04/2015 FINDINGS: RIGHT subclavian pacemaker leads project over RIGHT atrium and RIGHT ventricle. Enlargement of cardiac silhouette with slight  vascular congestion. Mediastinal contours normal. Scattered atelectasis RIGHT mid lung. Central bronchitic changes. Minimal perihilar infiltrates, improved. No pleural effusion or pneumothorax. IMPRESSION: Improved perihilar infiltrates. Electronically Signed   By: Lavonia Dana M.D.   On: 08/06/2015 08:43    Results for orders placed or performed during the hospital encounter of 08/01/15  Culture, blood (routine x 2) Call MD if unable to obtain prior to antibiotics being given     Status: None (Preliminary result)   Collection Time: 08/01/15 11:52 PM  Result Value Ref Range Status   Specimen Description BLOOD LEFT ANTECUBITAL  Final   Special Requests BOTTLES DRAWN AEROBIC AND ANAEROBIC 5CC  Final   Culture NO GROWTH 4 DAYS  Final   Report Status PENDING  Incomplete  Culture, blood (routine x 2) Call MD if unable to obtain prior to antibiotics being given     Status: None (Preliminary result)   Collection Time: 08/01/15 11:57 PM  Result Value Ref Range Status   Specimen Description BLOOD LEFT ANTECUBITAL  Final   Special Requests BOTTLES DRAWN AEROBIC AND ANAEROBIC 5CC  Final   Culture NO GROWTH 4 DAYS  Final   Report Status PENDING  Incomplete  Culture, sputum-assessment     Status: None   Collection Time: 08/02/15  1:08 PM  Result Value Ref Range Status   Specimen Description EXPECTORATED SPUTUM  Final   Special Requests NONE  Final   Sputum evaluation   Final    THIS SPECIMEN IS ACCEPTABLE. RESPIRATORY CULTURE REPORT TO FOLLOW.   Report Status 08/02/2015 FINAL  Final  Culture, respiratory (NON-Expectorated)     Status: None   Collection Time: 08/02/15  1:08 PM  Result Value Ref Range Status   Specimen Description SPUTUM  Final   Special Requests NONE  Final   Gram Stain   Final    MODERATE WBC PRESENT, PREDOMINANTLY PMN FEW SQUAMOUS EPITHELIAL CELLS PRESENT RARE GRAM POSITIVE RODS RARE GRAM NEGATIVE RODS RARE YEAST    Culture   Final    NORMAL OROPHARYNGEAL FLORA Performed  at [facility]    Report Status 08/05/2015 FINAL  Final  MRSA PCR Screening     Status: None   Collection Time: 08/03/15  7:30 AM  Result Value Ref Range Status   MRSA by PCR NEGATIVE NEGATIVE Final    Comment:        The GeneXpert MRSA Assay (FDA approved for NASAL specimens only), is one component of a comprehensive MRSA colonization surveillance program. It is not intended to diagnose MRSA infection nor to guide or monitor treatment for MRSA infections.   Culture, respiratory (NON-Expectorated)     Status: None (Preliminary result)   Collection Time: 08/03/15  8:48 AM  Result Value Ref Range Status   Specimen Description ENDOTRACHEAL  Final   Special Requests NONE  Final  Gram Stain   Final    ABUNDANT WBC PRESENT,BOTH PMN AND MONONUCLEAR RARE SQUAMOUS EPITHELIAL CELLS PRESENT NO ORGANISMS SEEN Performed at [facility]    Culture   Final    Culture reincubated for better growth Performed at [facility]    Report Status PENDING  Incomplete  Respiratory virus panel     Status: Abnormal   Collection Time: 08/04/15 10:50 AM  Result Value Ref Range Status   Respiratory Syncytial Virus A Negative Negative Final   Respiratory Syncytial Virus B Negative Negative Final   Influenza A Negative Negative Final   Influenza B Negative Negative Final   Parainfluenza 1 Negative Negative Final   Parainfluenza 2 Negative Negative Final   Parainfluenza 3 Negative Negative Final   Metapneumovirus Negative Negative Final   Rhinovirus Positive (A) Negative Final   Adenovirus Negative Negative Final    Comment: (NOTE) Performed At: [facility] [address] [place], [place] [id number] Lindon Romp MD [id number]   Culture, respiratory (NON-Expectorated)     Status: None (Preliminary result)   Collection Time: 08/04/15 11:35 AM  Result Value Ref Range Status   Specimen Description TRACHEAL ASPIRATE  Final   Special Requests NONE  Final    Gram Stain PENDING  Incomplete   Culture   Final    NO GROWTH 1 DAY Performed at [facility]    Report Status PENDING  Incomplete      ASSESSMENT / PLAN:  PULMONARY  ETT 10/20 >> 08/05/2015 A:  Acute respiratory failure 2nd to rhinovirus pneumonia acute lung injury   - Status post  extubation August 05, 2015. Currently on nasal cannula oxygen 4 L and saturating 99% P: \ Pulmonary toilet Oxygen to keep pulse ox greater than 88%   CARDIOVASCULAR  A:  Acute on chronic diastolic/systolic CHF.  Hx of A fib on amiodarone since 2014, coumadin.  Hx of HTN, Complete heart block s/p PM, HLD.     - Maintaining hemodynamics. Echocardiogram 08/04/2015 with normal left ventricle ejection fraction but paradoxical septal wall motion of unclear etiology  P:  Coumadin per pharmacy  Continue amiodarone, pravachol  Lopressor IV prn for HR > 120  Hold outpt tenormin, lasix, valsartan, HCTZ    RENAL  A:  CKD stage 3 with Overactive bladder.  Acute kidney injury at presentation   - 08/06/2015: Acute kidney injury improving .  P:  Continue daily Lasix Oral potassium replacement with lasix Monitor renal fx, urine outpt  Replace electrolytes as needed  Hold outpt mirabegron   GASTROINTESTINAL  A:  Nutrition.  Hx of HH, GERD, IBS.  P:  Oral diet Protonix   HEMATOLOGIC  A:  Leukocytosis.  P:  F/u CBC   INFECTIOUS  Results for orders placed or performed during the hospital encounter of 08/01/15  Culture, blood (routine x 2) Call MD if unable to obtain prior to antibiotics being given     Status: None (Preliminary result)   Collection Time: 08/01/15 11:52 PM  Result Value Ref Range Status   Specimen Description BLOOD LEFT ANTECUBITAL  Final   Special Requests BOTTLES DRAWN AEROBIC AND ANAEROBIC 5CC  Final   Culture NO GROWTH 4 DAYS  Final   Report Status PENDING  Incomplete  Culture, blood (routine x 2) Call MD if unable to obtain prior to antibiotics being  given     Status: None (Preliminary result)   Collection Time: 08/01/15 11:57 PM  Result Value Ref Range Status  Specimen Description BLOOD LEFT ANTECUBITAL  Final   Special Requests BOTTLES DRAWN AEROBIC AND ANAEROBIC 5CC  Final   Culture NO GROWTH 4 DAYS  Final   Report Status PENDING  Incomplete  Culture, sputum-assessment     Status: None   Collection Time: 08/02/15  1:08 PM  Result Value Ref Range Status   Specimen Description EXPECTORATED SPUTUM  Final   Special Requests NONE  Final   Sputum evaluation   Final    THIS SPECIMEN IS ACCEPTABLE. RESPIRATORY CULTURE REPORT TO FOLLOW.   Report Status 08/02/2015 FINAL  Final  Culture, respiratory (NON-Expectorated)     Status: None   Collection Time: 08/02/15  1:08 PM  Result Value Ref Range Status   Specimen Description SPUTUM  Final   Special Requests NONE  Final   Gram Stain   Final    MODERATE WBC PRESENT, PREDOMINANTLY PMN FEW SQUAMOUS EPITHELIAL CELLS PRESENT RARE GRAM POSITIVE RODS RARE GRAM NEGATIVE RODS RARE YEAST    Culture   Final    NORMAL OROPHARYNGEAL FLORA Performed at [facility]    Report Status 08/05/2015 FINAL  Final  MRSA PCR Screening     Status: None   Collection Time: 08/03/15  7:30 AM  Result Value Ref Range Status   MRSA by PCR NEGATIVE NEGATIVE Final    Comment:        The GeneXpert MRSA Assay (FDA approved for NASAL specimens only), is one component of a comprehensive MRSA colonization surveillance program. It is not intended to diagnose MRSA infection nor to guide or monitor treatment for MRSA infections.   Culture, respiratory (NON-Expectorated)     Status: None (Preliminary result)   Collection Time: 08/03/15  8:48 AM  Result Value Ref Range Status   Specimen Description ENDOTRACHEAL  Final   Special Requests NONE  Final   Gram Stain   Final    ABUNDANT WBC PRESENT,BOTH PMN AND MONONUCLEAR RARE SQUAMOUS EPITHELIAL CELLS PRESENT NO ORGANISMS SEEN Performed at [facility]    Culture   Final    Culture reincubated for better growth Performed at [facility]    Report Status PENDING  Incomplete  Respiratory virus panel     Status: Abnormal   Collection Time: 08/04/15 10:50 AM  Result Value Ref Range Status   Respiratory Syncytial Virus A Negative Negative Final   Respiratory Syncytial Virus B Negative Negative Final   Influenza A Negative Negative Final   Influenza B Negative Negative Final   Parainfluenza 1 Negative Negative Final   Parainfluenza 2 Negative Negative Final   Parainfluenza 3 Negative Negative Final   Metapneumovirus Negative Negative Final   Rhinovirus Positive (A) Negative Final   Adenovirus Negative Negative Final    Comment: (NOTE) Performed At: [facility] [address] [place], [place] [id number] Lindon Romp MD [id number]   Culture, respiratory (NON-Expectorated)     Status: None (Preliminary result)   Collection Time: 08/04/15 11:35 AM  Result Value Ref Range Status   Specimen Description TRACHEAL ASPIRATE  Final   Special Requests NONE  Final   Gram Stain PENDING  Incomplete   Culture   Final    NO GROWTH 1 DAY Performed at [facility]    Report Status PENDING  Incomplete    Recent Labs Lab 08/03/15 0725 08/04/15 0333 08/05/15 0542  PROCALCITON 0.10 1.51 1.09     A:  Sepsis with CAP at transfer to the ICU. Culture shows show viral rhinovirus  lung injury  P:   vanc 10/20 >>> 08/05/2015  Rocephin 10/19 >>> (aim to stop as soon as culture results are negative, not to exceed 7 days] Azithromycin 10/19 >>> 07/2315   ENDOCRINE  A:  Hx of DM with peripheral neuropathy.  Hx of hypothyroidism.  P:  SSI  Continue synthroid   NEUROLOGIC  A:  Hx of fibromyalgia.   - Normal wake-up assessment on when necessary fentanyl  P:  RASS goal: -1  PRN versed, fentanyl  Hold outpt neurontin - hospitalist to slowly restart   Global - Deconditioned will call physical  therapy consultation. This is a rhinovirus lung injury. She is extubated. Infiltrates improved. Moved out of ICU to the floor. Hospitalist to be primary and critical care medicine will sign off starting tomorrow 08/07/2015.     Dr. Brand Males, M.D., [facility].C.P Pulmonary and Critical Care Medicine Staff Physician [place] [facility] Pager: [phone], If no answer or between  15:00h - 7:00h: call [phone]  08/06/2015 2:09 PM

## 2015-08-06 NOTE — Progress Notes (Signed)
Pt. Refused BS and insulin.

## 2015-08-06 NOTE — Progress Notes (Signed)
NURSING PROGRESS NOTE  Jill Washington [id number] Transfer Data: 08/06/2015 4:20 PM Attending Provider: Brand Males, MD [id number], STACEY, MD Code Status: Full  Jill Washington is a 81 y.o. female patient transferred from 2 M -No acute distress noted.  -No complaints of shortness of breath.  -No complaints of chest pain.   Cardiac Monitoring: Box # 26 in place.   Blood pressure 159/52, pulse 77, temperature 99 F (37.2 C), temperature source Oral, resp. rate 22, height 5\' 7"  (1.702 m), weight 88.8 kg (195 lb 12.3 oz), SpO2 96 %.   IV Fluids:  IV in place, SL.  Allergies:  Cymbalta; Iodinated diagnostic agents; and Other  Past Medical History:   has a past medical history of History of empyema of pleura; Hypertension; Mitral valve prolapse; History of atrial fibrillation; Sick sinus syndrome ([place]); Venous insufficiency; Hypercholesteremia; Hypothyroidism; Hiatal hernia; IBS (irritable bowel syndrome); Colon polyps; Fibromyalgia; Vitamin D deficiency; Peripheral neuropathy ([place]); Critical illness polyneuropathy ([place]); History of skin cancer; Diverticulosis of sigmoid colon (02/05/2007); GERD (gastroesophageal reflux disease); Pacemaker; Arthritis; History of gallstones; Fecal incontinence; Urinary incontinence; Blood transfusion; Chicken pox (as a child); Measles (as a child); Mumps (as a child); Shingles (8 yrs ago); Overactive bladder (05/07/2011); CHB (complete heart block) ([place]) (11/10/2014); and Medicare annual wellness visit, subsequent (05/15/2015).  Past Surgical History:   has past surgical history that includes Cervical spine surgery; Cholecystectomy (03/2005); Total abdominal hysterectomy w/ bilateral salpingoophorectomy; Colonoscopy (02/05/2007,02/05/05); Esophagogastroduodenoscopy (02/05/2005,01/22/90); pacemaker implanted (05/11/97,08/02/05); Appendectomy (1971); Tonsillectomy; Cardiac catheterization (05/10/1997); NM MYOCAR PERF WALL MOTION (08/24/2010); and pacemaker generator  change (N/A, 02/25/2012).  Social History:   reports that she quit smoking about 63 years ago. Her smoking use included Cigarettes. She has a 12 pack-year smoking history. She has never used smokeless tobacco. She reports that she does not drink alcohol or use illicit drugs.  Skin: Wound care consulted for sacral area, leg discoloration noted to bilateral extremities. Foam intact to sacral area.   Patient/Family orientated to room. Information packet given to patient/family. Admission inpatient armband information verified with patient/family to include name and date of birth and placed on patient arm. Side rails up x 2, fall assessment and education completed with patient/family. Patient/family able to verbalize understanding of risk associated with falls and verbalized understanding to call for assistance before getting out of bed. Call light within reach. Patient/family able to voice and demonstrate understanding of unit orientation instructions.    Will continue to evaluate and treat per MD orders.

## 2015-08-06 NOTE — Progress Notes (Signed)
[place] for warfarin Indication: atrial fibrillation  Allergies  Allergen Reactions  . Cymbalta [Duloxetine Hcl] Other (See Comments)    Could not think straight  . Iodinated Diagnostic Agents Other (See Comments)    Pt was not told what the reaction was  . Other Itching    Dial Soap    Patient Measurements: Height: 5\' 7"  (170.2 cm) Weight: 198 lb 10.2 oz (90.1 kg) IBW/kg (Calculated) : 61.6  Vital Signs: Temp: 99.3 F (37.4 C) (10/23 0800) Temp Source: Core (Comment) (10/23 0600) BP: 144/46 mmHg (10/23 0800) Pulse Rate: 73 (10/23 0800)  Labs:  Recent Labs  08/03/15 1430 08/03/15 2040  08/04/15 0333 08/05/15 0542 08/06/15 1130  HGB  --   --   < > 11.8* 9.8* 10.7*  HCT  --   --   --  37.2 29.8* 33.3*  PLT  --   --   --  139* 131* 167  LABPROT  --   --   --  24.1* 22.7* 20.6*  INR  --   --   --  2.18* 2.01* 1.77*  CREATININE  --   --   --  1.59* 1.43*  --   CKTOTAL  --   --   --  47  --   --   TROPONINI 0.04* 0.03  --   --   --   --   < > = values in this interval not displayed.  Estimated Creatinine Clearance: 31.9 mL/min (by C-G formula based on Cr of 1.43).   Assessment: 81 yo F to be continued on home warfarin for afib.  Warfarin PTA dose = 1 mg every day except 2 mg on Mondays (8 mg weekly). Last dose PTA was 10/18 prior to coming to the hospital.  INR down to 1.77 today - subtherapeutic. Hgb 10.7, no bleeding noted.  Plts 167 - stable, improving.  Goal of Therapy:  INR 2-3 Monitor platelets by anticoagulation protocol: Yes   Plan:  -warfarin 2mg  po x1 tonight -daily INR -follow for s/s bleeding. Watch Hgb closely. -follow for any effect antibiotics (azithromycin and ceftriaxone) may have on INR along with antibiotic LOT  Legrand Como, Pharm.D., BCPS, AAHIVP Clinical Pharmacist Phone: [phone] or [phone] 08/06/2015, 12:33 PM

## 2015-08-06 NOTE — Progress Notes (Signed)
Pt BP 172/53 Practitioner was paged called me later with the order to keep her under observation, will continue to monitor BP every  Graciela Husbands

## 2015-08-07 ENCOUNTER — Telehealth: Payer: Self-pay | Admitting: *Deleted

## 2015-08-07 DIAGNOSIS — I482 Chronic atrial fibrillation: Secondary | ICD-10-CM

## 2015-08-07 DIAGNOSIS — B348 Other viral infections of unspecified site: Secondary | ICD-10-CM

## 2015-08-07 LAB — CULTURE, BLOOD (ROUTINE X 2)
CULTURE: NO GROWTH
Culture: NO GROWTH

## 2015-08-07 LAB — GLUCOSE, CAPILLARY
GLUCOSE-CAPILLARY: 83 mg/dL (ref 65–99)
GLUCOSE-CAPILLARY: 87 mg/dL (ref 65–99)
Glucose-Capillary: 108 mg/dL — ABNORMAL HIGH (ref 65–99)
Glucose-Capillary: 110 mg/dL — ABNORMAL HIGH (ref 65–99)
Glucose-Capillary: 129 mg/dL — ABNORMAL HIGH (ref 65–99)
Glucose-Capillary: 87 mg/dL (ref 65–99)
Glucose-Capillary: 94 mg/dL (ref 65–99)

## 2015-08-07 LAB — BASIC METABOLIC PANEL
Anion gap: 10 (ref 5–15)
BUN: 28 mg/dL — AB (ref 6–20)
CALCIUM: 9.5 mg/dL (ref 8.9–10.3)
CO2: 31 mmol/L (ref 22–32)
CREATININE: 1.24 mg/dL — AB (ref 0.44–1.00)
Chloride: 104 mmol/L (ref 101–111)
GFR, EST AFRICAN AMERICAN: 44 mL/min — AB (ref >60)
GFR, EST NON AFRICAN AMERICAN: 38 mL/min — AB (ref >60)
Glucose, Bld: 117 mg/dL — ABNORMAL HIGH (ref 65–99)
Potassium: 3.7 mmol/L (ref 3.5–5.1)
SODIUM: 145 mmol/L (ref 135–145)

## 2015-08-07 LAB — CBC
HCT: 31.2 % — ABNORMAL LOW (ref 36.0–46.0)
Hemoglobin: 10.1 g/dL — ABNORMAL LOW (ref 12.0–15.0)
MCH: 30.9 pg (ref 26.0–34.0)
MCHC: 32.4 g/dL (ref 30.0–36.0)
MCV: 95.4 fL (ref 78.0–100.0)
PLATELETS: 173 10*3/uL (ref 150–400)
RBC: 3.27 MIL/uL — ABNORMAL LOW (ref 3.87–5.11)
RDW: 12.6 % (ref 11.5–15.5)
WBC: 7.9 10*3/uL (ref 4.0–10.5)

## 2015-08-07 LAB — CULTURE, RESPIRATORY

## 2015-08-07 LAB — CULTURE, RESPIRATORY W GRAM STAIN

## 2015-08-07 LAB — PROTIME-INR
INR: 2.02 — AB (ref 0.00–1.49)
PROTHROMBIN TIME: 22.7 s — AB (ref 11.6–15.2)

## 2015-08-07 LAB — PHOSPHORUS: Phosphorus: 3.7 mg/dL (ref 2.5–4.6)

## 2015-08-07 LAB — MAGNESIUM: MAGNESIUM: 2 mg/dL (ref 1.7–2.4)

## 2015-08-07 MED ORDER — ACETAMINOPHEN 325 MG PO TABS
650.0000 mg | ORAL_TABLET | Freq: Four times a day (QID) | ORAL | Status: DC | PRN
  Administered 2015-08-08: 650 mg via ORAL
  Filled 2015-08-07: qty 2

## 2015-08-07 MED ORDER — LEVOTHYROXINE SODIUM 125 MCG PO TABS
125.0000 ug | ORAL_TABLET | Freq: Every day | ORAL | Status: DC
  Administered 2015-08-08 – 2015-08-10 (×3): 125 ug via ORAL
  Filled 2015-08-07 (×3): qty 1

## 2015-08-07 MED ORDER — GUAIFENESIN ER 600 MG PO TB12
1200.0000 mg | ORAL_TABLET | Freq: Two times a day (BID) | ORAL | Status: DC
  Administered 2015-08-07 – 2015-08-10 (×7): 1200 mg via ORAL
  Filled 2015-08-07 (×7): qty 2

## 2015-08-07 MED ORDER — HALOPERIDOL LACTATE 5 MG/ML IJ SOLN: 1.0000 mg | Freq: Once | INTRAMUSCULAR | Status: DC

## 2015-08-07 MED ORDER — WARFARIN SODIUM 1 MG PO TABS
1.0000 mg | ORAL_TABLET | Freq: Once | ORAL | Status: AC
  Administered 2015-08-07: 1 mg via ORAL
  Filled 2015-08-07 (×2): qty 1

## 2015-08-07 NOTE — Telephone Encounter (Signed)
Opened in error

## 2015-08-07 NOTE — Evaluation (Signed)
Physical Therapy Evaluation Patient Details Name: Jill Washington MRN: [id number] DOB: 03-Dec-1925 Today's Date: 08/07/2015   History of Present Illness  Pt adm with PNA and intubated 10/20. Extubated 10/22. Pt with viral rhinovirus lung injury. PMH - pleural empyema, hypertension, mitral valve prolapse, atrial fibrillation, sick sinus syndrome  Clinical Impression  Pt admitted with above diagnosis and presents to PT with functional limitations due to deficits listed below (See PT problem list). Pt needs skilled PT to maximize independence and safety to allow discharge to ST-SNF prior to return home.      Follow Up Recommendations SNF    Equipment Recommendations  Other (comment) (To be assessed)    Recommendations for Other Services       Precautions / Restrictions Precautions Precautions: Fall Restrictions Weight Bearing Restrictions: No      Mobility  Bed Mobility               General bed mobility comments: Pt on BSC  Transfers Overall transfer level: Needs assistance Equipment used: Rolling walker (2 wheeled) Transfers: Sit to/from Stand Sit to Stand: Mod assist         General transfer comment: Verbal cues for hand placement and assist to bring hips up.  Ambulation/Gait Ambulation/Gait assistance: Min assist;+2 safety/equipment Ambulation Distance (Feet): 8 Feet (8' x 1, 2' x 1) Assistive device: Rolling walker (2 wheeled) Gait Pattern/deviations: Step-through pattern;Decreased step length - right;Decreased step length - left;Shuffle;Trunk flexed   Gait velocity interpretation: <1.8 ft/sec, indicative of risk for recurrent falls General Gait Details: Verbal cues to stay closer to walker and to stand more erect. Assist for balance and support. SpO2 92% on O2. Dyspnea 3 of 4.  Stairs            Wheelchair Mobility    Modified Rankin (Stroke Patients Only)       Balance Overall balance assessment: Needs assistance Sitting-balance support:  No upper extremity supported;Feet supported Sitting balance-Leahy Scale: Good     Standing balance support: Bilateral upper extremity supported Standing balance-Leahy Scale: Poor Standing balance comment: walker and min A for static standing.                             Pertinent Vitals/Pain Pain Assessment: No/denies pain    Home Living Family/patient expects to be discharged to:: Private residence Living Arrangements: Alone Available Help at Discharge: Family;Available PRN/intermittently Type of Home: Mobile home Home Access: Stairs to enter Entrance Stairs-Rails: Psychiatric nurse of Steps: 3-4 Home Layout: One level Home Equipment: Cane - single point      Prior Function Level of Independence: Independent               Hand Dominance        Extremity/Trunk Assessment   Upper Extremity Assessment: Defer to OT evaluation           Lower Extremity Assessment: Generalized weakness         Communication   Communication: HOH  Cognition Arousal/Alertness: Awake/alert Behavior During Therapy: WFL for tasks assessed/performed Overall Cognitive Status: Within Functional Limits for tasks assessed                      General Comments      Exercises        Assessment/Plan    PT Assessment Patient needs continued PT services  PT Diagnosis Difficulty walking;Generalized weakness   PT Problem List Decreased strength;Decreased activity  tolerance;Decreased balance;Decreased mobility;Decreased knowledge of use of DME;Cardiopulmonary status limiting activity  PT Treatment Interventions DME instruction;Gait training;Functional mobility training;Therapeutic activities;Therapeutic exercise;Balance training;Patient/family education   PT Goals (Current goals can be found in the Care Plan section) Acute Rehab PT Goals Patient Stated Goal: Return home eventually PT Goal Formulation: With patient Time For Goal Achievement:  08/21/15 Potential to Achieve Goals: Good    Frequency Min 3X/week   Barriers to discharge        Co-evaluation               End of Session Equipment Utilized During Treatment: Oxygen Activity Tolerance: Patient limited by fatigue Patient left: in chair;with call bell/phone within reach;with chair alarm set Nurse Communication: Mobility status         Time: [id number] PT Time Calculation (min) (ACUTE ONLY): 21 min   Charges:   PT Evaluation $Initial PT Evaluation Tier I: 1 Procedure     PT G Codes:        Kohlton Gilpatrick 2015-09-03, 10:11 AM Suanne Marker PT [phone]

## 2015-08-07 NOTE — Consult Note (Addendum)
  WOC wound consult note Reason for Consult: Consult requested for coccyx.  Pt states wound has been there "a long time before coming to the hospital." She admits to sitting in a recliner at home and wears a perineal pad for frequent urinary incontinence. Wound type: Stage 2 pressure injury Pressure Ulcer POA: Yes Measurement: Affected area approx 2X2cm, there is limited visibility since pt is leaning over in the recliner chair during the assessment and declined offer to stand up and provide a better view. Stage 2 is approx .5X.5X.1cm and the surrounding area is red and macerated; appearance consistent with moisture associated skin damage. Wound bed: Red and moist Dressing procedure/placement/frequency: Foam dressing to protect and absorb moisture.  If this becomes frequently soiled, then it should be left off and barrier cream can be applied to repel moisture.  Discussed plan of care with patient and she verbalized understanding. Please re-consult if further assistance is needed.  Thank-you,  Julien Girt MSN, [place], [place], [place], [place]

## 2015-08-07 NOTE — Progress Notes (Signed)
Nutrition Follow-up  DOCUMENTATION CODES:   Obesity unspecified  INTERVENTION:   -Ensure Enlive po BID, each supplement provides 350 kcal and 20 grams of protein -MVI daily  NUTRITION DIAGNOSIS:   Inadequate oral intake related to poor appetite as evidenced by meal completion < 50%.  Ongoing  GOAL:   Patient will meet greater than or equal to 90% of their needs  Progressing  MONITOR:   PO intake, Supplement acceptance, Diet advancement, Labs, Weight trends, Skin, I & O's  REASON FOR ASSESSMENT:   Consult Enteral/tube feeding initiation and management  ASSESSMENT:   Jill Washington is a 81 y.o. female with past medical history of pleural empyema, hypertension, mitral valve prolapse, atrial fibrillation, sick sinus syndrome, hyperlipidemia, hypothyroidism, hiatal hernia, IBS, fibromyalgia who comes to the emergency department due to complaints of shortness of breath for 1 day.  Pt was extubated on 08/05/15. Pt was transferred from ICU to medical floor on 08/06/15.   Pt being cleaned by nurse tech at time of visit. She has been advanced to a full liquid diet. PO: 0-50%. Pt with increased nutritional needs due to wound healing. RD will add Ensure supplement and MVI to promote healing.   Therapy following; recommending SNF placement at this time.   Labs reviewed: CBGS: 94-129.   Diet Order:  Diet full liquid Room service appropriate?: Yes; Fluid consistency:: Thin  Skin:  Reviewed, no issues (stage II coccyx)  Last BM:  08/07/15  Height:   Ht Readings from Last 1 Encounters:  08/06/15 5\' 7"  (1.702 m)    Weight:   Wt Readings from Last 1 Encounters:  08/06/15 195 lb 12.3 oz (88.8 kg)    Ideal Body Weight:  61.36 kg  BMI:  Body mass index is 30.65 kg/(m^2).  Estimated Nutritional Needs:   Kcal:  1850-2050  Protein:  90-105 grams  Fluid:  >1.8 L  EDUCATION NEEDS:   Education needs addressed  Danniel Tones A. Jimmye Norman, RD, LDN, CDE Pager:  [phone] After hours Pager: [phone]

## 2015-08-07 NOTE — Progress Notes (Signed)
[place] for Warfarin Indication: atrial fibrillation  Allergies  Allergen Reactions  . Cymbalta [Duloxetine Hcl] Other (See Comments)    Could not think straight  . Iodinated Diagnostic Agents Other (See Comments)    Pt was not told what the reaction was  . Other Itching    Dial Soap    Patient Measurements: Height: 5\' 7"  (170.2 cm) Weight: 195 lb 12.3 oz (88.8 kg) IBW/kg (Calculated) : 61.6  Vital Signs: Temp: 98.2 F (36.8 C) (10/24 0446) Temp Source: Oral (10/24 0446) BP: 157/56 mmHg (10/24 0446) Pulse Rate: 72 (10/24 0446)  Labs:  Recent Labs  08/05/15 0542 08/06/15 1130 08/07/15 0458  HGB 9.8* 10.7* 10.1*  HCT 29.8* 33.3* 31.2*  PLT 131* 167 173  LABPROT 22.7* 20.6* 22.7*  INR 2.01* 1.77* 2.02*  CREATININE 1.43* 1.29* 1.24*    Estimated Creatinine Clearance: 36.6 mL/min (by C-G formula based on Cr of 1.24).   Assessment: 81 yo F to be continued on home warfarin for afib.  Warfarin PTA dose = 1 mg every day except 2 mg on Mondays (8 mg weekly). Last dose PTA was 10/18 prior to coming to the hospital.  INR up to 2.02 today  Goal of Therapy:  INR 2-3 Monitor platelets by anticoagulation protocol: Yes   Plan:  -warfarin 1 mg po x1 tonight -daily INR -follow for s/s bleeding. Watch Hgb closely.  Thank you Anette Guarneri, PharmD [phone] 08/07/2015, 10:33 AM

## 2015-08-07 NOTE — Progress Notes (Signed)
PROGRESS NOTE    Jill Washington [id number] DOB: Jan 27, 1926 DOA: 08/01/2015 PCP: [name], MD  HPI/Brief narrative 81 year old female patient with history of empyema, HTN, MVP, A. fib, HLD, hypothyroid, pacemaker, overactive bladder, was admitted to [facility] on 08/01/15 with complaints of sore throat, dyspnea and productive cough. Initially seen at an urgent care center, diagnosed with pneumonia and treated with IM antibiotics and discharged home with prednisone, doxycycline and nitrofurantoin. No improvement seen and hence she called EMS and found to be mildly hypoxic at 92%. Initially admitted by hospitalist service for community-acquired pneumonia. Early morning 10/20, patient decompensated with worsening dyspnea and hypoxia. CCM consulted, patient was intubated and transferred to ICU. After extubation and stabilization, she was transferred back to the floor and [place] service on 10/24.   Assessment/Plan:  Community-acquired pneumonia - Treated empirically with IV Rocephin, azithromycin and briefly with vancomycin - Blood cultures 2: Negative, sputum culture: Normal flora and RSV panel: Positive for rhinovirus - Improving. Add mucolytic.  Acute hypoxic respiratory failure/VDRF - CCM input appreciated. Extubated 08/05/15. Breathing spontaneously on oxygen 3 L via nasal cannula and saturating at 97%. - This was secondary to rhinovirus pneumonia acute lung injury  Acute on chronic diastolic CHF - Continue IV Lasix, strict intake and output charting  A. Fib - V paced on monitor. - Continue amiodarone - Anticoagulated with Coumadin. CHADSVASC 4 (based on age, HTN, gender)  Essential hypertension - Mildly uncontrolled. Antihypertensives currently on hold-consider resuming.  Complete heart block status post PPM  HLD  Acute on stage III chronic kidney disease/overactive bladder - Improved  GERD - PPI  Glucose intolerance SSI. A1c 5.3  Hypothyroid - Continue Synthroid    Normocytic anemia - Stable   DVT prophylaxis: Anticoagulated on Coumadin  Code Status: Full Family Communication: None at bedside Disposition Plan: DC when medically stable   Consultants:  CCM-signed off   Procedures:  Intubation for mechanical ventilation: Extubated 10/22   Antibiotics:  Azithromycin 10/18 >   IV Rocephin 10/18 >  IV vancomycin 1 dose on 10/20  Subjective: Cough but unable to bring up thick sputum. States had a coughing spell last night. This morning feels better. Dyspnea improved. No chest pain.   Objective: Filed Vitals:   08/07/15 0446 08/07/15 1025 08/07/15 1349 08/07/15 1455  BP: 157/56  164/58   Pulse: 72  77   Temp: 98.2 F (36.8 C)  98.6 F (37 C)   TempSrc: Oral  Oral   Resp: 22  16   Height:      Weight:      SpO2: 98% 97% 97% 97%    Intake/Output Summary (Last 24 hours) at 08/07/15 1648 Last data filed at 08/07/15 1310  Gross per 24 hour  Intake    440 ml  Output    300 ml  Net    140 ml   Filed Weights   08/05/15 0427 08/06/15 0600 08/06/15 1609  Weight: 91.4 kg (201 lb 8 oz) 90.1 kg (198 lb 10.2 oz) 88.8 kg (195 lb 12.3 oz)     Exam:  General exam: Pleasant elderly female sitting up comfortably propped up in bed.  Respiratory system: reduced breath sounds bilaterally with scattered occasional bilateral expiratory rhonchi and basal crackles. No increased work of breathing. Cardiovascular system: S1 & S2 heard, RRR. No JVD, murmurs, gallops, clicks or pedal edema. Telemetry: V paced rhythm  Gastrointestinal system: Abdomen is nondistended, soft and nontender. Normal bowel sounds heard. Central nervous system: Alert and oriented. No  focal neurological deficits. Extremities: Symmetric 5 x 5 power.   Data Reviewed: Basic Metabolic Panel:  Recent Labs Lab 08/01/15 2352  08/03/15 0725 08/04/15 0333 08/05/15 0542 08/06/15 1130 08/07/15 0458  NA  --   < > 138 142 137 141 145  K  --   < > 4.0 4.1 3.4* 4.5 3.7   CL  --   < > 108 105 101 102 104  CO2  --   < > 22 26 28 27 31   GLUCOSE  --   < > 164* 108* 136* 148* 117*  BUN  --   < > 28* 29* 38* 30* 28*  CREATININE  --   < > 1.67* 1.59* 1.43* 1.29* 1.24*  CALCIUM  --   < > 8.9 9.1 8.4* 9.1 9.5  MG 1.5*  --  2.1  --  2.0 1.9 2.0  PHOS 3.3  --   --   --  3.4 3.1 3.7  < > = values in this interval not displayed. Liver Function Tests:  Recent Labs Lab 08/02/15 0426 08/03/15 0725 08/04/15 0333  AST 53* 48* 32  ALT 28 35 27  ALKPHOS 45 46 80  BILITOT 1.0 0.4 0.4  PROT 6.6 6.2* 6.2*  ALBUMIN 3.5 3.0* 2.9*   No results for input(s): LIPASE, AMYLASE in the last 168 hours. No results for input(s): AMMONIA in the last 168 hours. CBC:  Recent Labs Lab 08/01/15 2009 08/02/15 0426 08/03/15 0725 08/04/15 0333 08/05/15 0542 08/06/15 1130 08/07/15 0458  WBC 11.4* 16.9* 15.1* 8.5 7.8 8.7 7.9  NEUTROABS 10.3* 16.1* 13.9*  --   --   --   --   HGB 12.0 12.0 12.0 11.8* 9.8* 10.7* 10.1*  HCT 35.9* 36.5 37.0 37.2 29.8* 33.3* 31.2*  MCV 93.5 94.6 95.9 96.4 95.5 95.7 95.4  PLT 132* 163 167 139* 131* 167 173   Cardiac Enzymes:  Recent Labs Lab 08/01/15 2009 08/03/15 0725 08/03/15 1430 08/03/15 2040 08/04/15 0333  CKTOTAL  --   --   --   --  32  TROPONINI <0.03 0.07* 0.04* 0.03  --    BNP (last 3 results) No results for input(s): PROBNP in the last 8760 hours. CBG:  Recent Labs Lab 08/06/15 2021 08/07/15 0011 08/07/15 0430 08/07/15 0752 08/07/15 1208  GLUCAP 102* 94 108* 129* 110*    Recent Results (from the past 240 hour(s))  Culture, blood (routine x 2) Call MD if unable to obtain prior to antibiotics being given     Status: None   Collection Time: 08/01/15 11:52 PM  Result Value Ref Range Status   Specimen Description BLOOD LEFT ANTECUBITAL  Final   Special Requests BOTTLES DRAWN AEROBIC AND ANAEROBIC 5CC  Final   Culture NO GROWTH 5 DAYS  Final   Report Status 08/07/2015 FINAL  Final  Culture, blood (routine x 2) Call MD  if unable to obtain prior to antibiotics being given     Status: None   Collection Time: 08/01/15 11:57 PM  Result Value Ref Range Status   Specimen Description BLOOD LEFT ANTECUBITAL  Final   Special Requests BOTTLES DRAWN AEROBIC AND ANAEROBIC 5CC  Final   Culture NO GROWTH 5 DAYS  Final   Report Status 08/07/2015 FINAL  Final  Culture, sputum-assessment     Status: None   Collection Time: 08/02/15  1:08 PM  Result Value Ref Range Status   Specimen Description EXPECTORATED SPUTUM  Final   Special Requests NONE  Final  Sputum evaluation   Final    THIS SPECIMEN IS ACCEPTABLE. RESPIRATORY CULTURE REPORT TO FOLLOW.   Report Status 08/02/2015 FINAL  Final  Culture, respiratory (NON-Expectorated)     Status: None   Collection Time: 08/02/15  1:08 PM  Result Value Ref Range Status   Specimen Description SPUTUM  Final   Special Requests NONE  Final   Gram Stain   Final    MODERATE WBC PRESENT, PREDOMINANTLY PMN FEW SQUAMOUS EPITHELIAL CELLS PRESENT RARE GRAM POSITIVE RODS RARE GRAM NEGATIVE RODS RARE YEAST    Culture   Final    NORMAL OROPHARYNGEAL FLORA Performed at [facility]    Report Status 08/05/2015 FINAL  Final  MRSA PCR Screening     Status: None   Collection Time: 08/03/15  7:30 AM  Result Value Ref Range Status   MRSA by PCR NEGATIVE NEGATIVE Final    Comment:        The GeneXpert MRSA Assay (FDA approved for NASAL specimens only), is one component of a comprehensive MRSA colonization surveillance program. It is not intended to diagnose MRSA infection nor to guide or monitor treatment for MRSA infections.   Culture, respiratory (NON-Expectorated)     Status: None   Collection Time: 08/03/15  8:48 AM  Result Value Ref Range Status   Specimen Description ENDOTRACHEAL  Final   Special Requests NONE  Final   Gram Stain   Final    ABUNDANT WBC PRESENT,BOTH PMN AND MONONUCLEAR RARE SQUAMOUS EPITHELIAL CELLS PRESENT NO ORGANISMS SEEN Performed at  [facility]    Culture   Final    RARE STAPHYLOCOCCUS AUREUS Note: RIFAMPIN AND GENTAMICIN SHOULD NOT BE USED AS SINGLE DRUGS FOR TREATMENT OF STAPH INFECTIONS. This organism DOES NOT demonstrate inducible Clindamycin resistance in vitro. RARE CANDIDA ALBICANS Performed at [facility]    Report Status 08/07/2015 FINAL  Final   Organism ID, Bacteria STAPHYLOCOCCUS AUREUS  Final      Susceptibility   Staphylococcus aureus - MIC*    CLINDAMYCIN <=0.25 SENSITIVE Sensitive     ERYTHROMYCIN >=8 RESISTANT Resistant     GENTAMICIN <=0.5 SENSITIVE Sensitive     LEVOFLOXACIN 4 INTERMEDIATE Intermediate     OXACILLIN 0.5 SENSITIVE Sensitive     RIFAMPIN <=0.5 SENSITIVE Sensitive     TRIMETH/SULFA <=10 SENSITIVE Sensitive     VANCOMYCIN 1 SENSITIVE Sensitive     TETRACYCLINE <=1 SENSITIVE Sensitive     MOXIFLOXACIN 2 RESISTANT Resistant     * RARE STAPHYLOCOCCUS AUREUS  Respiratory virus panel     Status: Abnormal   Collection Time: 08/04/15 10:50 AM  Result Value Ref Range Status   Respiratory Syncytial Virus A Negative Negative Final   Respiratory Syncytial Virus B Negative Negative Final   Influenza A Negative Negative Final   Influenza B Negative Negative Final   Parainfluenza 1 Negative Negative Final   Parainfluenza 2 Negative Negative Final   Parainfluenza 3 Negative Negative Final   Metapneumovirus Negative Negative Final   Rhinovirus Positive (A) Negative Final   Adenovirus Negative Negative Final    Comment: (NOTE) Performed At: [facility] [address] [place], [place] [id number] Lindon Romp MD [id number]   Culture, respiratory (NON-Expectorated)     Status: None   Collection Time: 08/04/15 11:35 AM  Result Value Ref Range Status   Specimen Description TRACHEAL ASPIRATE  Final   Special Requests NONE  Final   Gram Stain   Final    NO WBC  SEEN NO SQUAMOUS EPITHELIAL CELLS SEEN NO ORGANISMS SEEN Performed at [facility]     Culture   Final    NO GROWTH 2 DAYS Performed at [facility]    Report Status 08/06/2015 FINAL  Final         Studies: Dg Chest Port 1 View  08/06/2015  CLINICAL DATA:  Endotracheal tube, hypertension, former smoker, atrial fibrillation EXAM: PORTABLE CHEST 1 VIEW COMPARISON:  Portable exam 0513 hours compared to 08/04/2015 FINDINGS: RIGHT subclavian pacemaker leads project over RIGHT atrium and RIGHT ventricle. Enlargement of cardiac silhouette with slight vascular congestion. Mediastinal contours normal. Scattered atelectasis RIGHT mid lung. Central bronchitic changes. Minimal perihilar infiltrates, improved. No pleural effusion or pneumothorax. IMPRESSION: Improved perihilar infiltrates. Electronically Signed   By: Lavonia Dana M.D.   On: 08/06/2015 08:43        Scheduled Meds: . amiodarone  200 mg Oral Daily  . antiseptic oral rinse  7 mL Mouth Rinse q12n4p  . antiseptic oral rinse  7 mL Mouth Rinse QID  . budesonide  0.25 mg Nebulization BID  . cefTRIAXone (ROCEPHIN) IVPB 1 gram/50 mL D5W  1 g Intravenous Q24H  . chlorhexidine  15 mL Mouth Rinse BID  . furosemide  40 mg Intravenous Daily  . guaiFENesin  1,200 mg Oral BID  . haloperidol lactate  1 mg Intravenous Once  . Influenza vac split quadrivalent PF  0.5 mL Intramuscular Tomorrow-1000  . insulin aspart  0-15 Units Subcutaneous 6 times per day  . ipratropium-albuterol  3 mL Nebulization TID  . levothyroxine  125 mcg Per Tube QAC breakfast  . metoprolol  10 mg Intravenous Once  . pantoprazole  40 mg Oral Daily  . pravastatin  40 mg Per Tube q1800  . Warfarin - Pharmacist Dosing Inpatient   Does not apply q1800   Continuous Infusions:   Principal Problem:   CAP (community acquired pneumonia) Active Problems:   Hypothyroidism   HYPERCHOLESTEROLEMIA   Essential hypertension   Atrial fibrillation (HCC)   Irritable bowel syndrome   Acute respiratory failure with hypoxia (HCC)   Rhinovirus  infection    Time spent: 30 minutes    Jaquille Kau, MD, FACP, FHM. [facility] Pager [phone]  If 7PM-7AM, please contact night-coverage [email or web address] Password [email or web address] 08/07/2015, 4:48 PM    LOS: 6 days

## 2015-08-07 NOTE — Progress Notes (Signed)
Occupational Therapy Evaluation Patient Details Name: Jill Washington MRN: [id number] DOB: 1926/07/10 Today's Date: 08/07/2015    History of Present Illness Pt adm with PNA and intubated 10/20. Extubated 10/22. Pt with viral rhinovirus lung injury. PMH - pleural empyema, hypertension, mitral valve prolapse, atrial fibrillation, sick sinus syndrome   Clinical Impression   PTA, pt independent with ADL and mobility. Pt demonstrates a significant functional decline and would benefit from skilled OT services at SNF to facilitate return to PLOF. Will follow acutely to address established goals.     Follow Up Recommendations  SNF;Supervision/Assistance - 24 hour    Equipment Recommendations  3 in 1 bedside comode    Recommendations for Other Services       Precautions / Restrictions Precautions Precautions: Fall Restrictions Weight Bearing Restrictions: No      Mobility Bed Mobility               General bed mobility comments: Pt on BSC  Transfers Overall transfer level: Needs assistance Equipment used: Rolling walker (2 wheeled) Transfers: Sit to/from Stand Sit to Stand: Mod assist         General transfer comment: Verbal cues for hand placement and assist to bring hips up.    Balance Overall balance assessment: Needs assistance Sitting-balance support: No upper extremity supported;Feet supported Sitting balance-Leahy Scale: Good     Standing balance support: Bilateral upper extremity supported Standing balance-Leahy Scale: Poor Standing balance comment: heavy reliance on RW                            ADL Overall ADL's : Needs assistance/impaired     Grooming: Set up;Supervision/safety;Sitting   Upper Body Bathing: Minimal assitance;Sitting   Lower Body Bathing: Moderate assistance;Sit to/from stand   Upper Body Dressing : Minimal assistance;Sitting   Lower Body Dressing: Moderate assistance;Sit to/from stand   Toilet Transfer:  Minimal assistance;RW;BSC   Toileting- Clothing Manipulation and Hygiene: Maximal assistance;Sit to/from stand       Functional mobility during ADLs: Minimal assistance;Rolling walker (10 ft) General ADL Comments: Endurance and generalized weakness significantly impacting ability to complete ADL. Only able to ambulate @ 8 ft to [facility]. Desat to 85 on 4L.     Vision     Perception     Praxis      Pertinent Vitals/Pain Pain Assessment: No/denies pain  HR stable O2 desat to 85 on 4L during mobility      Hand Dominance     Extremity/Trunk Assessment Upper Extremity Assessment Upper Extremity Assessment: Generalized weakness   Lower Extremity Assessment Lower Extremity Assessment: Defer to PT evaluation   Cervical / Trunk Assessment Cervical / Trunk Assessment: Normal   Communication Communication Communication: HOH   Cognition Arousal/Alertness: Awake/alert Behavior During Therapy: WFL for tasks assessed/performed Overall Cognitive Status: Within Functional Limits for tasks assessed                     General Comments       Exercises Exercises: Other exercises Other Exercises Other Exercises: Educated on general UB level 1 theraband ex to complete within tolerance   Shoulder Instructions      Home Living Family/patient expects to be discharged to:: Private residence Living Arrangements: Alone Available Help at Discharge: Family;Available PRN/intermittently Type of Home: Mobile home Home Access: Stairs to enter Entrance Stairs-Number of Steps: 3-4 Entrance Stairs-Rails: Right;Left Home Layout: One level  Home Equipment: Cane - single point          Prior Functioning/Environment Level of Independence: Independent             OT Diagnosis: Generalized weakness   OT Problem List: Decreased strength;Decreased activity tolerance;Impaired balance (sitting and/or standing);Decreased safety awareness;Decreased knowledge of use  of DME or AE;Cardiopulmonary status limiting activity   OT Treatment/Interventions: Self-care/ADL training;Therapeutic exercise;Energy conservation;DME and/or AE instruction;Therapeutic activities;Patient/family education;Balance training    OT Goals(Current goals can be found in the care plan section) Acute Rehab OT Goals Patient Stated Goal: Return home eventually OT Goal Formulation: With patient Time For Goal Achievement: 08/21/15 Potential to Achieve Goals: Good ADL Goals Pt Will Perform Upper Body Bathing: with modified independence;sitting Pt Will Perform Lower Body Bathing: with supervision;sit to/from stand Pt Will Transfer to Toilet: with supervision;ambulating;bedside commode Pt Will Perform Toileting - Clothing Manipulation and hygiene: with supervision;sit to/from stand Pt/caregiver will Perform Home Exercise Program: Both right and left upper extremity;With written HEP provided;Increased strength;With theraband (level 1)  OT Frequency: Min 2X/week   Barriers to D/C:            Co-evaluation              End of Session Equipment Utilized During Treatment: Gait belt;Rolling walker;Oxygen (4L) Nurse Communication: Mobility status  Activity Tolerance: Patient limited by fatigue Patient left: in chair;with call bell/phone within reach;with chair alarm set   Time: 1030-1055 OT Time Calculation (min): 25 min Charges:  OT General Charges $OT Visit: 1 Procedure OT Evaluation $Initial OT Evaluation Tier I: 1 Procedure OT Treatments $Self Care/Home Management : 8-22 mins G-Codes:    Lenwood Balsam,HILLARY 08/20/2015, 11:16 AM   Maurie Boettcher, OTR/L  [phone] 08/20/15

## 2015-08-08 DIAGNOSIS — J1289 Other viral pneumonia: Secondary | ICD-10-CM | POA: Diagnosis not present

## 2015-08-08 LAB — PROTIME-INR
INR: 2.28 — ABNORMAL HIGH (ref 0.00–1.49)
PROTHROMBIN TIME: 24.9 s — AB (ref 11.6–15.2)

## 2015-08-08 MED ORDER — WARFARIN SODIUM 1 MG PO TABS
1.0000 mg | ORAL_TABLET | Freq: Once | ORAL | Status: AC
  Administered 2015-08-08: 1 mg via ORAL
  Filled 2015-08-08: qty 1

## 2015-08-08 MED ORDER — PRAVASTATIN SODIUM 40 MG PO TABS
40.0000 mg | ORAL_TABLET | Freq: Every day | ORAL | Status: DC
  Administered 2015-08-09: 40 mg via ORAL
  Filled 2015-08-08: qty 1

## 2015-08-08 NOTE — Progress Notes (Addendum)
PROGRESS NOTE    Jill Washington [id number] DOB: 1926-09-11 DOA: 08/01/2015 PCP: [name], MD  HPI/Brief narrative 81 year old female patient with history of empyema, HTN, MVP, A. fib, HLD, hypothyroid, pacemaker, overactive bladder, was admitted to [facility] on 08/01/15 with complaints of sore throat, dyspnea and productive cough. Initially seen at an urgent care center, diagnosed with pneumonia and treated with IM antibiotics and discharged home with prednisone, doxycycline and nitrofurantoin. No improvement seen and hence she called EMS and found to be mildly hypoxic at 92%. Initially admitted by hospitalist service for community-acquired pneumonia. Early morning 10/20, patient decompensated with worsening dyspnea and hypoxia. CCM consulted, patient was intubated and transferred to ICU. After extubation and stabilization, she was transferred back to the floor and [place] service on 10/24. Slowly improving.   Assessment/Plan:  Community-acquired pneumonia - Treated empirically with IV Rocephin, azithromycin and briefly with vancomycin - Blood cultures 2: Negative, sputum culture: Normal flora and RSV panel: Positive for rhinovirus - Improving. Added mucolytic.  Acute hypoxic respiratory failure/VDRF - CCM input appreciated. Extubated 08/05/15. Breathing spontaneously on oxygen 1 L via nasal cannula and saturating at mid 90's. - This was secondary to rhinovirus pneumonia acute lung injury  Acute on chronic diastolic CHF - Continue IV Lasix, strict intake and output charting. Consider changing to PO in am.  A. Fib - AV paced on monitor. - Continue amiodarone - Anticoagulated with Coumadin. CHADSVASC 4 (based on age, HTN, gender)  Essential hypertension - Mildly uncontrolled. Antihypertensives currently on hold- consider resuming at DC  Complete heart block status post PPM  HLD  Acute on stage III chronic kidney disease/overactive bladder - Improved  GERD - PPI  Glucose  intolerance SSI. A1c 5.3  Hypothyroid - Continue Synthroid   Normocytic anemia - Stable  Stage 2 pressure ulcer on sacrum, present on admission - per [facility] 10/24   DVT prophylaxis: Anticoagulated on Coumadin  Code Status: Full Family Communication: None at bedside Disposition Plan: DC to SNF, possibly 10/26.   Consultants:  CCM-signed off   Procedures:  Intubation for mechanical ventilation: Extubated 10/22   Antibiotics:  Azithromycin 10/18 >08/06/2015   IV Rocephin 10/18 >10/24  IV vancomycin 1 dose on 10/20  Subjective: Feels much better. Sputum breaking up and able to cough better. Denies dyspnea.  Objective: Filed Vitals:   08/08/15 0552 08/08/15 0804 08/08/15 1409 08/08/15 1422  BP: 134/54  140/45   Pulse: 70  75   Temp: 98.1 F (36.7 C)  99 F (37.2 C)   TempSrc: Oral  Oral   Resp: 20  20   Height:      Weight:      SpO2: 96% 98% 96% 94%    Intake/Output Summary (Last 24 hours) at 08/08/15 1752 Last data filed at 08/08/15 0929  Gross per 24 hour  Intake    200 ml  Output      0 ml  Net    200 ml   Filed Weights   08/05/15 0427 08/06/15 0600 08/06/15 1609  Weight: 91.4 kg (201 lb 8 oz) 90.1 kg (198 lb 10.2 oz) 88.8 kg (195 lb 12.3 oz)     Exam:  General exam: Pleasant elderly female sitting up comfortably propped up in bed.  Respiratory system: Improved breath sounds bilaterally. Slightly harsh but no wheezing or rhonchi. Occasional basal crackles. No increased work of breathing. Cardiovascular system: S1 & S2 heard, RRR. No JVD, murmurs, gallops, clicks or pedal edema. Telemetry: AV paced rhythm  Gastrointestinal  system: Abdomen is nondistended, soft and nontender. Normal bowel sounds heard. Central nervous system: Alert and oriented. No focal neurological deficits. Extremities: Symmetric 5 x 5 power.   Data Reviewed: Basic Metabolic Panel:  Recent Labs Lab 08/01/15 2352  08/03/15 0725 08/04/15 0333 08/05/15 0542 08/06/15 1130  08/07/15 0458  NA  --   < > 138 142 137 141 145  K  --   < > 4.0 4.1 3.4* 4.5 3.7  CL  --   < > 108 105 101 102 104  CO2  --   < > 22 26 28 27 31   GLUCOSE  --   < > 164* 108* 136* 148* 117*  BUN  --   < > 28* 29* 38* 30* 28*  CREATININE  --   < > 1.67* 1.59* 1.43* 1.29* 1.24*  CALCIUM  --   < > 8.9 9.1 8.4* 9.1 9.5  MG 1.5*  --  2.1  --  2.0 1.9 2.0  PHOS 3.3  --   --   --  3.4 3.1 3.7  < > = values in this interval not displayed. Liver Function Tests:  Recent Labs Lab 08/02/15 0426 08/03/15 0725 08/04/15 0333  AST 53* 48* 32  ALT 28 35 27  ALKPHOS 45 46 80  BILITOT 1.0 0.4 0.4  PROT 6.6 6.2* 6.2*  ALBUMIN 3.5 3.0* 2.9*   No results for input(s): LIPASE, AMYLASE in the last 168 hours. No results for input(s): AMMONIA in the last 168 hours. CBC:  Recent Labs Lab 08/01/15 2009 08/02/15 0426 08/03/15 0725 08/04/15 0333 08/05/15 0542 08/06/15 1130 08/07/15 0458  WBC 11.4* 16.9* 15.1* 8.5 7.8 8.7 7.9  NEUTROABS 10.3* 16.1* 13.9*  --   --   --   --   HGB 12.0 12.0 12.0 11.8* 9.8* 10.7* 10.1*  HCT 35.9* 36.5 37.0 37.2 29.8* 33.3* 31.2*  MCV 93.5 94.6 95.9 96.4 95.5 95.7 95.4  PLT 132* 163 167 139* 131* 167 173   Cardiac Enzymes:  Recent Labs Lab 08/01/15 2009 08/03/15 0725 08/03/15 1430 08/03/15 2040 08/04/15 0333  CKTOTAL  --   --   --   --  57  TROPONINI <0.03 0.07* 0.04* 0.03  --    BNP (last 3 results) No results for input(s): PROBNP in the last 8760 hours. CBG:  Recent Labs Lab 08/07/15 0430 08/07/15 0752 08/07/15 1208 08/07/15 1649 08/07/15 2145  GLUCAP 108* 129* 110* 83 87    Recent Results (from the past 240 hour(s))  Culture, blood (routine x 2) Call MD if unable to obtain prior to antibiotics being given     Status: None   Collection Time: 08/01/15 11:52 PM  Result Value Ref Range Status   Specimen Description BLOOD LEFT ANTECUBITAL  Final   Special Requests BOTTLES DRAWN AEROBIC AND ANAEROBIC 5CC  Final   Culture NO GROWTH 5 DAYS   Final   Report Status 08/07/2015 FINAL  Final  Culture, blood (routine x 2) Call MD if unable to obtain prior to antibiotics being given     Status: None   Collection Time: 08/01/15 11:57 PM  Result Value Ref Range Status   Specimen Description BLOOD LEFT ANTECUBITAL  Final   Special Requests BOTTLES DRAWN AEROBIC AND ANAEROBIC 5CC  Final   Culture NO GROWTH 5 DAYS  Final   Report Status 08/07/2015 FINAL  Final  Culture, sputum-assessment     Status: None   Collection Time: 08/02/15  1:08 PM  Result Value  Ref Range Status   Specimen Description EXPECTORATED SPUTUM  Final   Special Requests NONE  Final   Sputum evaluation   Final    THIS SPECIMEN IS ACCEPTABLE. RESPIRATORY CULTURE REPORT TO FOLLOW.   Report Status 08/02/2015 FINAL  Final  Culture, respiratory (NON-Expectorated)     Status: None   Collection Time: 08/02/15  1:08 PM  Result Value Ref Range Status   Specimen Description SPUTUM  Final   Special Requests NONE  Final   Gram Stain   Final    MODERATE WBC PRESENT, PREDOMINANTLY PMN FEW SQUAMOUS EPITHELIAL CELLS PRESENT RARE GRAM POSITIVE RODS RARE GRAM NEGATIVE RODS RARE YEAST    Culture   Final    NORMAL OROPHARYNGEAL FLORA Performed at [facility]    Report Status 08/05/2015 FINAL  Final  MRSA PCR Screening     Status: None   Collection Time: 08/03/15  7:30 AM  Result Value Ref Range Status   MRSA by PCR NEGATIVE NEGATIVE Final    Comment:        The GeneXpert MRSA Assay (FDA approved for NASAL specimens only), is one component of a comprehensive MRSA colonization surveillance program. It is not intended to diagnose MRSA infection nor to guide or monitor treatment for MRSA infections.   Culture, respiratory (NON-Expectorated)     Status: None   Collection Time: 08/03/15  8:48 AM  Result Value Ref Range Status   Specimen Description ENDOTRACHEAL  Final   Special Requests NONE  Final   Gram Stain   Final    ABUNDANT WBC PRESENT,BOTH PMN AND  MONONUCLEAR RARE SQUAMOUS EPITHELIAL CELLS PRESENT NO ORGANISMS SEEN Performed at [facility]    Culture   Final    RARE STAPHYLOCOCCUS AUREUS Note: RIFAMPIN AND GENTAMICIN SHOULD NOT BE USED AS SINGLE DRUGS FOR TREATMENT OF STAPH INFECTIONS. This organism DOES NOT demonstrate inducible Clindamycin resistance in vitro. RARE CANDIDA ALBICANS Performed at [facility]    Report Status 08/07/2015 FINAL  Final   Organism ID, Bacteria STAPHYLOCOCCUS AUREUS  Final      Susceptibility   Staphylococcus aureus - MIC*    CLINDAMYCIN <=0.25 SENSITIVE Sensitive     ERYTHROMYCIN >=8 RESISTANT Resistant     GENTAMICIN <=0.5 SENSITIVE Sensitive     LEVOFLOXACIN 4 INTERMEDIATE Intermediate     OXACILLIN 0.5 SENSITIVE Sensitive     RIFAMPIN <=0.5 SENSITIVE Sensitive     TRIMETH/SULFA <=10 SENSITIVE Sensitive     VANCOMYCIN 1 SENSITIVE Sensitive     TETRACYCLINE <=1 SENSITIVE Sensitive     MOXIFLOXACIN 2 RESISTANT Resistant     * RARE STAPHYLOCOCCUS AUREUS  Respiratory virus panel     Status: Abnormal   Collection Time: 08/04/15 10:50 AM  Result Value Ref Range Status   Respiratory Syncytial Virus A Negative Negative Final   Respiratory Syncytial Virus B Negative Negative Final   Influenza A Negative Negative Final   Influenza B Negative Negative Final   Parainfluenza 1 Negative Negative Final   Parainfluenza 2 Negative Negative Final   Parainfluenza 3 Negative Negative Final   Metapneumovirus Negative Negative Final   Rhinovirus Positive (A) Negative Final   Adenovirus Negative Negative Final    Comment: (NOTE) Performed At: [facility] [address] [place], [place] [id number] Lindon Romp MD [id number]   Culture, respiratory (NON-Expectorated)     Status: None   Collection Time: 08/04/15 11:35 AM  Result Value Ref Range Status   Specimen Description TRACHEAL ASPIRATE  Final   Special Requests NONE  Final   Gram Stain   Final    NO WBC SEEN NO  SQUAMOUS EPITHELIAL CELLS SEEN NO ORGANISMS SEEN Performed at [facility]    Culture   Final    NO GROWTH 2 DAYS Performed at [facility]    Report Status 08/06/2015 FINAL  Final         Studies: No results found.      Scheduled Meds: . amiodarone  200 mg Oral Daily  . antiseptic oral rinse  7 mL Mouth Rinse q12n4p  . antiseptic oral rinse  7 mL Mouth Rinse QID  . budesonide  0.25 mg Nebulization BID  . cefTRIAXone (ROCEPHIN) IVPB 1 gram/50 mL D5W  1 g Intravenous Q24H  . chlorhexidine  15 mL Mouth Rinse BID  . furosemide  40 mg Intravenous Daily  . guaiFENesin  1,200 mg Oral BID  . ipratropium-albuterol  3 mL Nebulization TID  . levothyroxine  125 mcg Oral QAC breakfast  . pantoprazole  40 mg Oral Daily  . pravastatin  40 mg Per Tube q1800  . Warfarin - Pharmacist Dosing Inpatient   Does not apply q1800   Continuous Infusions:   Principal Problem:   CAP (community acquired pneumonia) Active Problems:   Hypothyroidism   HYPERCHOLESTEROLEMIA   Essential hypertension   Atrial fibrillation (HCC)   Irritable bowel syndrome   Acute respiratory failure with hypoxia (HCC)   Rhinovirus infection    Time spent: 20 minutes    Reshad Saab, MD, FACP, FHM. [facility] Pager [phone]  If 7PM-7AM, please contact night-coverage [email or web address] Password [email or web address] 08/08/2015, 5:52 PM    LOS: 7 days

## 2015-08-08 NOTE — Evaluation (Signed)
Clinical/Bedside Swallow Evaluation Patient Details  Name: Jill Washington MRN: [id number] Date of Birth: 1925/11/04  Today's Date: 08/08/2015 Time: SLP Start Time (ACUTE ONLY): 0900 SLP Stop Time (ACUTE ONLY): 0915 SLP Time Calculation (min) (ACUTE ONLY): 15 min  Past Medical History:  Past Medical History  Diagnosis Date  . History of empyema of pleura   . Hypertension   . Mitral valve prolapse   . History of atrial fibrillation   . Sick sinus syndrome ([place])   . Venous insufficiency   . Hypercholesteremia   . Hypothyroidism   . Hiatal hernia   . IBS (irritable bowel syndrome)   . Colon polyps     hyperplastic  . Fibromyalgia   . Vitamin D deficiency   . Peripheral neuropathy ([place])   . Critical illness polyneuropathy ([place])   . History of skin cancer     face  . Diverticulosis of sigmoid colon 02/05/2007    mild  . GERD (gastroesophageal reflux disease)   . Pacemaker   . Arthritis   . History of gallstones   . Fecal incontinence   . Urinary incontinence   . Blood transfusion   . Chicken pox as a child  . Measles as a child  . Mumps as a child  . Shingles 8 yrs ago  . Overactive bladder 05/07/2011  . CHB (complete heart block) ([place]) 11/10/2014  . Medicare annual wellness visit, subsequent 05/15/2015    Allergies verified: UTD   Immunization Status: Flu vaccine-- NA  Tdap-- 9-10 years ago per patient  PNA-- 09/30/07 (23)  Shingles-- 04/07/14   A/P:  Changes to [place], [place] or Personal Hx: UTD Pap-- none recent  MMG--declines further MMgs (last 08/12/03 BI-RADS 1: Neg)  Bone Density-- 08/11/03 with Prescott Gum, MD- Normal, declines further testing CCS-- 05/07/12 with Silvano Rusk, MD- polyp removed- no    Past Surgical History:  Past Surgical History  Procedure Laterality Date  . Cervical spine surgery    . Cholecystectomy  03/2005    by Dr. Dalbert Batman  . Total abdominal hysterectomy w/ bilateral salpingoophorectomy    . Colonoscopy  02/05/2007,02/05/05    Dr. Silvano Rusk  .  Esophagogastroduodenoscopy  02/05/2005,01/22/90    Dr. Silvano Rusk, Dr. Rachelle Hora  . Pacemaker implanted  05/11/97,08/02/05    [facility]  . Appendectomy  1971  . Tonsillectomy    . Cardiac catheterization  05/10/1997    Normal coronaries, MVP  . Nm myocar perf wall motion  08/24/2010    Normal  . Pacemaker generator change N/A 02/25/2012    Procedure: PACEMAKER GENERATOR CHANGE;  Surgeon: Sanda Klein, MD;  Location: [place] CATH LAB;  Service: Cardiovascular;  Laterality: N/A;   HPI:  Jill Washington is a 81 y.o. female with past medical history of pleural empyema, hypertension, mitral valve prolapse, atrial fibrillation, sick sinus syndrome, hyperlipidemia, hypothyroidism, hiatal hernia, IBS, fibromyalgia who comes to the emergency department due to complaints of shortness of breath for 1 day. She states that yesterday she was feeling well, but later in the evening felt "achy", went to bed and woke up around 1:30 AM with sore throat, dyspnea and occasionally productive of yellowish sputum cough. Presents with Community Acquired Pneumonia, intubated 10/20-10/22 due to respiratory insufficiency. Pt currently on full liquid diet, not previously seen by speech.    Assessment / Plan / Recommendation Clinical Impression  Pt presents with typical swallow function with no overt s/s of aspiration. Pt has hoarse voice and cough prior to po trials  and did not worsen with po's. SLP educated pt on standard aspiration precautions (sit upright, slow rate, alternate bites and sips). Recommend regular solids, thin liquids, pills whole with liquid. Will f/u once to check for diet tolerance.     Aspiration Risk  Mild    Diet Recommendation Age appropriate regular solids;Thin   Medication Administration: Whole meds with liquid Compensations: Small sips/bites;Slow rate    Other  Recommendations Oral Care Recommendations: Oral care BID   Follow Up Recommendations       Frequency and Duration min 1 x/week   1 week   Pertinent Vitals/Pain NA    SLP Swallow Goals     Swallow Study Prior Functional Status       General Other Pertinent Information: Jill Washington is a 81 y.o. female with past medical history of pleural empyema, hypertension, mitral valve prolapse, atrial fibrillation, sick sinus syndrome, hyperlipidemia, hypothyroidism, hiatal hernia, IBS, fibromyalgia who comes to the emergency department due to complaints of shortness of breath for 1 day. She states that yesterday she was feeling well, but later in the evening felt "achy", went to bed and woke up around 1:30 AM with sore throat, dyspnea and occasionally productive of yellowish sputum cough. Presents with Community Acquired Pneumonia, intubated 10/20-10/22 due to respiratory insufficiency. Pt currently on full liquid diet, not previously seen by speech.  Type of Study: Bedside swallow evaluation Diet Prior to this Study: Thin liquids Temperature Spikes Noted: No Respiratory Status: Supplemental O2 delivered via (comment) History of Recent Intubation: Yes Length of Intubations (days): 2 days Date extubated: 08/05/15 Behavior/Cognition: Alert;Cooperative;Pleasant mood Oral Cavity - Dentition: Missing dentition;Poor condition Self-Feeding Abilities: Able to feed self Patient Positioning: Upright in bed Baseline Vocal Quality: Hoarse Volitional Cough: Strong;Congested Volitional Swallow: Able to elicit    Oral/Motor/Sensory Function Overall Oral Motor/Sensory Function: Appears within functional limits for tasks assessed Labial ROM: Within Functional Limits Labial Symmetry: Within Functional Limits Lingual ROM: Within Functional Limits Lingual Symmetry: Within Functional Limits Facial ROM: Within Functional Limits Facial Symmetry: Within Functional Limits   Ice Chips Ice chips: Not tested   Thin Liquid Thin Liquid: Within functional limits Presentation: Cup;Self Fed    Nectar Thick Nectar Thick Liquid: Not tested    Honey Thick Honey Thick Liquid: Not tested   Puree Puree: Within functional limits Presentation: Self Fed   Solid   GO    Solid: Within functional limits Presentation: [place], [place] 08/08/2015,9:36 AM

## 2015-08-08 NOTE — Care Management Important Message (Signed)
Important Message  Patient Details  Name: Jill Washington MRN: [id number] Date of Birth: 11-22-1925   Medicare Important Message Given:  Yes-third notification given    Nathen May 08/08/2015, 1:59 PM

## 2015-08-08 NOTE — Progress Notes (Signed)
[place] for Warfarin Indication: atrial fibrillation  Allergies  Allergen Reactions  . Cymbalta [Duloxetine Hcl] Other (See Comments)    Could not think straight  . Iodinated Diagnostic Agents Other (See Comments)    Pt was not told what the reaction was  . Other Itching    Dial Soap   Assessment: 81 yo F to be continued on home warfarin for afib.  Warfarin PTA dose = 1 mg every day except 2 mg on Mondays (8 mg weekly). Last dose PTA was 10/18 prior to coming to the hospital.  INR up to 2.28 today  Goal of Therapy:  INR 2-3 Monitor platelets by anticoagulation protocol: Yes   Plan:  -Repeat warfarin 1 mg po x1 tonight -Daily INR -Day 8 of Rocephin - consider adding a stop date?     Patient Measurements: Height: 5\' 7"  (170.2 cm) Weight: 195 lb 12.3 oz (88.8 kg) IBW/kg (Calculated) : 61.6  Vital Signs: Temp: 98.1 F (36.7 C) (10/25 0552) Temp Source: Oral (10/25 0552) BP: 134/54 mmHg (10/25 0552) Pulse Rate: 70 (10/25 0552)  Labs:  Recent Labs  08/06/15 1130 08/07/15 0458 08/08/15 0616  HGB 10.7* 10.1*  --   HCT 33.3* 31.2*  --   PLT 167 173  --   LABPROT 20.6* 22.7* 24.9*  INR 1.77* 2.02* 2.28*  CREATININE 1.29* 1.24*  --     Estimated Creatinine Clearance: 36.6 mL/min (by C-G formula based on Cr of 1.24).  Thank you Anette Guarneri, PharmD [phone] 08/08/2015, 9:01 AM

## 2015-08-08 NOTE — Progress Notes (Signed)
PT Cancellation Note  Patient Details Name: Jill Washington MRN: [id number] DOB: 21-Apr-1926   Cancelled Treatment:    Reason Eval/Treat Not Completed: Fatigue limiting ability to participate. Pt reports she had been up for a long time and was too tired.   Aniken Monestime 08/08/2015, 2:37 PM [facility] [phone]

## 2015-08-09 LAB — BASIC METABOLIC PANEL
Anion gap: 14 (ref 5–15)
BUN: 27 mg/dL — AB (ref 6–20)
CHLORIDE: 98 mmol/L — AB (ref 101–111)
CO2: 29 mmol/L (ref 22–32)
Calcium: 9.3 mg/dL (ref 8.9–10.3)
Creatinine, Ser: 1.51 mg/dL — ABNORMAL HIGH (ref 0.44–1.00)
GFR calc non Af Amer: 30 mL/min — ABNORMAL LOW (ref >60)
GFR, EST AFRICAN AMERICAN: 35 mL/min — AB (ref >60)
Glucose, Bld: 102 mg/dL — ABNORMAL HIGH (ref 65–99)
POTASSIUM: 4.4 mmol/L (ref 3.5–5.1)
SODIUM: 141 mmol/L (ref 135–145)

## 2015-08-09 LAB — CBC
HEMATOCRIT: 32.2 % — AB (ref 36.0–46.0)
HEMOGLOBIN: 10.2 g/dL — AB (ref 12.0–15.0)
MCH: 30.5 pg (ref 26.0–34.0)
MCHC: 31.7 g/dL (ref 30.0–36.0)
MCV: 96.4 fL (ref 78.0–100.0)
Platelets: 182 10*3/uL (ref 150–400)
RBC: 3.34 MIL/uL — AB (ref 3.87–5.11)
RDW: 12.6 % (ref 11.5–15.5)
WBC: 7.7 10*3/uL (ref 4.0–10.5)

## 2015-08-09 LAB — PROTIME-INR
INR: 2.76 — ABNORMAL HIGH (ref 0.00–1.49)
PROTHROMBIN TIME: 28.8 s — AB (ref 11.6–15.2)

## 2015-08-09 MED ORDER — GABAPENTIN 300 MG PO CAPS
300.0000 mg | ORAL_CAPSULE | Freq: Two times a day (BID) | ORAL | Status: DC
  Administered 2015-08-09 – 2015-08-10 (×3): 300 mg via ORAL
  Filled 2015-08-09 (×3): qty 1

## 2015-08-09 MED ORDER — WARFARIN 0.5 MG HALF TABLET
0.5000 mg | ORAL_TABLET | Freq: Once | ORAL | Status: AC
Start: 2015-08-09 — End: 2015-08-09
  Administered 2015-08-09: 0.5 mg via ORAL
  Filled 2015-08-09: qty 1

## 2015-08-09 MED ORDER — WARFARIN SODIUM 1 MG PO TABS: 0.5000 mg | ORAL_TABLET | Freq: Once | ORAL | Status: DC

## 2015-08-09 MED ORDER — FUROSEMIDE 20 MG PO TABS: 20.0000 mg | ORAL_TABLET | Freq: Every day | ORAL | Status: DC

## 2015-08-09 MED ORDER — WARFARIN SODIUM 2 MG PO TABS: ORAL_TABLET | ORAL | Status: DC

## 2015-08-09 MED ORDER — FUROSEMIDE 20 MG PO TABS
20.0000 mg | ORAL_TABLET | Freq: Every day | ORAL | Status: DC
  Administered 2015-08-09 – 2015-08-10 (×2): 20 mg via ORAL
  Filled 2015-08-09 (×2): qty 1

## 2015-08-09 NOTE — NC FL2 (Signed)
[place] LEVEL OF CARE SCREENING TOOL     IDENTIFICATION  Patient Name: Jill Washington Birthdate: 28-Oct-1925 Sex: female Admission Date (Current Location): 08/01/2015  [facility] and [place] Number: [employer] and Address:  [facility], [place] [address], [place], [place] [address]      Provider Number: [id number]  Attending Physician Name and Address:  Velvet Bathe, MD  Relative Name and Phone Number:       Current Level of Care: Hospital Recommended Level of Care: [place] Prior Approval Number:    Date Approved/Denied:   PASRR Number:    Discharge Plan: SNF    Current Diagnoses: Patient Active Problem List   Diagnosis Date Noted  . Rhinovirus infection 08/06/2015  . Acute respiratory failure with hypoxia ([place]) 08/04/2015  . Community acquired pneumonia 08/01/2015  . CAP (community acquired pneumonia) 08/01/2015  . Medicare annual wellness visit, subsequent 05/15/2015  . CHB (complete heart block) ([place]) 11/10/2014  . Pedal edema 04/10/2014  . Chicken pox   . Measles   . Mumps   . Shingles   . Long term current use of anticoagulant therapy 01/30/2013  . Pacemaker -  Morgan leads implanted in 1998 generator change 2006 and 2013 07/31/2011  . Low back pain 05/07/2011  . Overactive bladder 05/07/2011  . B12 deficiency 01/22/2011  . Anemia 01/22/2011  . CELLULITIS, LEG, RIGHT 09/24/2010  . Irritable bowel syndrome 04/19/2010  . Vitamin D deficiency 10/17/2009  . HYPERCHOLESTEROLEMIA 10/17/2009  . Critical illness polyneuropathy ([place]) 06/23/2008  . Atrial fibrillation ([place]) 06/23/2008  . VENOUS INSUFFICIENCY 06/23/2008  . EMPYEMA 06/22/2008  . POLYP, COLON 02/29/2008  . Hypothyroidism 02/29/2008  . PERIPHERAL NEUROPATHY 02/29/2008  . Essential hypertension 02/29/2008  . MITRAL VALVE PROLAPSE 02/29/2008  . SICK SINUS SYNDROME 02/29/2008  . HIATAL HERNIA 02/29/2008  . FIBROMYALGIA 02/29/2008  .  SKIN CANCER, HX OF 02/29/2008    Orientation ACTIVITIES/SOCIAL BLADDER RESPIRATION   (AOx4)   Incontinent O2 (As needed)  BEHAVIORAL SYMPTOMS/MOOD NEUROLOGICAL BOWEL NUTRITION STATUS      Continent Diet (cardiac)  PHYSICIAN VISITS COMMUNICATION OF NEEDS Height & Weight Skin    Verbally 5\' 7"  (170.2 cm) 195 lbs. PU Stage and Appropriate Care          AMBULATORY STATUS RESPIRATION     (limited assist) O2 (As needed)      Personal Care Assistance Level of Assistance  Bathing, Dressing Bathing Assistance: Limited assistance   Dressing Assistance: Limited assistance      Functional Limitations Info                SPECIAL CARE FACTORS FREQUENCY                      Additional Factors Info  Allergies               Current Medications (08/09/2015): Current Facility-Administered Medications  Medication Dose Route Frequency Provider Last Rate Last Dose  . acetaminophen (TYLENOL) tablet 650 mg  650 mg Oral Q6H PRN Modena Jansky, MD   650 mg at 08/08/15 1458  . albuterol (PROVENTIL) (2.5 MG/3ML) 0.083% nebulizer solution 2.5 mg  2.5 mg Nebulization Q4H PRN Brand Males, MD      . amiodarone (PACERONE) tablet 200 mg  200 mg Oral Daily Reubin Milan, MD   200 mg at 08/09/15 1210  . antiseptic oral rinse (CPC / CETYLPYRIDINIUM CHLORIDE 0.05%) solution 7 mL  7 mL Mouth Rinse q12n4p Brand Males, MD   7 mL at 08/09/15 1200  . antiseptic oral rinse solution (CORINZ)  7 mL Mouth Rinse QID Chesley Mires, MD   7 mL at 08/09/15 0400  . budesonide (PULMICORT) nebulizer solution 0.25 mg  0.25 mg Nebulization BID Brand Males, MD   0.25 mg at 08/09/15 1002  . chlorhexidine (PERIDEX) 0.12 % solution 15 mL  15 mL Mouth Rinse BID Brand Males, MD   15 mL at 08/08/15 2157  . furosemide (LASIX) injection 40 mg  40 mg Intravenous Daily Virginia Crews, MD   40 mg at 08/08/15 1018  . furosemide (LASIX) tablet 20 mg  20 mg Oral Daily Velvet Bathe, MD   20 mg at  08/09/15 1219  . gabapentin (NEURONTIN) capsule 300 mg  300 mg Oral BID Velvet Bathe, MD   300 mg at 08/09/15 1219  . guaiFENesin (MUCINEX) 12 hr tablet 1,200 mg  1,200 mg Oral BID Modena Jansky, MD   1,200 mg at 08/09/15 1209  . ipratropium-albuterol (DUONEB) 0.5-2.5 (3) MG/3ML nebulizer solution 3 mL  3 mL Nebulization TID Brand Males, MD   3 mL at 08/09/15 1537  . levothyroxine (SYNTHROID, LEVOTHROID) tablet 125 mcg  125 mcg Oral QAC breakfast Modena Jansky, MD   125 mcg at 08/09/15 0630  . metoprolol (LOPRESSOR) injection 2.5 mg  2.5 mg Intravenous Q4H PRN Chesley Mires, MD      . pantoprazole (PROTONIX) EC tablet 40 mg  40 mg Oral Daily Chesley Mires, MD   40 mg at 08/09/15 1210  . pravastatin (PRAVACHOL) tablet 40 mg  40 mg Oral q1800 Modena Jansky, MD      . warfarin (COUMADIN) tablet 0.5 mg  0.5 mg Oral ONCE-1800 Velvet Bathe, MD      . Warfarin - Pharmacist Dosing Inpatient   Does not apply [place]       Do not use this list as official medication orders. Please verify with discharge summary.  Discharge Medications:   Medication List    STOP taking these medications        ibuprofen 200 MG tablet  Commonly known as:  ADVIL,MOTRIN     loperamide 2 MG capsule  Commonly known as:  IMODIUM     MYRBETRIQ 50 MG Tb24 tablet  Generic drug:  mirabegron ER     valsartan-hydrochlorothiazide 80-12.5 MG tablet  Commonly known as:  DIOVAN-HCT      TAKE these medications        amiodarone 200 MG tablet  Commonly known as:  PACERONE  Take 1 tablet (200 mg total) by mouth daily.     ARTIFICIAL TEARS 1.4 % ophthalmic solution  Generic drug:  polyvinyl alcohol  Place 1 drop into both eyes 3 (three) times daily as needed for dry eyes.     atenolol 50 MG tablet  Commonly known as:  TENORMIN  TAKE ONE AND ONE-HALF TABLETS DAILY     cholecalciferol 1000 UNITS tablet  Commonly known as:  VITAMIN D  Take 1,000 Units by mouth daily.     esomeprazole 40 MG  capsule  Commonly known as:  NEXIUM  TAKE 1 CAPSULE DAILY BEFORE BREAKFAST     furosemide 20 MG tablet  Commonly known as:  LASIX  Take 1 tablet (20 mg total) by mouth daily.     gabapentin 300 MG capsule  Commonly known as:  NEURONTIN  TAKE 1 CAPSULE TWICE A DAY  levothyroxine 125 MCG tablet  Commonly known as:  SYNTHROID, LEVOTHROID  TAKE 1 TABLET DAILY BEFORE BREAKFAST     multivitamin with minerals Tabs tablet  Take 1 tablet by mouth daily.     pravastatin 40 MG tablet  Commonly known as:  PRAVACHOL  Take 1 tablet (40 mg total) by mouth daily.     vitamin B-12 500 MCG tablet  Commonly known as:  CYANOCOBALAMIN  Take 500 mcg by mouth daily.     warfarin 1 MG tablet  Commonly known as:  COUMADIN  Take 0.5 tablets (0.5 mg total) by mouth one time only at 6 PM.     warfarin 2 MG tablet  Commonly known as:  COUMADIN  TAKE 1 TABLET DAILY BY MOUTH ON MONDAYS AND 1/2 TABLET ON ALL OTHER DAYS OF THE WEEK OR AS DIRECTED  Start taking on:  08/10/2015        Relevant Imaging Results:  Relevant Lab Results:  Recent Labs    Additional Information    Cranford Mon, LCSW

## 2015-08-09 NOTE — Discharge Summary (Addendum)
Physician Discharge Summary  Jill Washington [id number] DOB: 30-Dec-1925 DOA: 08/01/2015  PCP: [name], MD  Admit date: 08/01/2015 Discharge date: 08/10/2015  Time spent: > 35 minutes  Recommendations for Outpatient Follow-up:  1. Pt will need continued PT services while at SNF  Discharge Diagnoses:  Principal Problem:   CAP (community acquired pneumonia) Active Problems:   Hypothyroidism   HYPERCHOLESTEROLEMIA   Essential hypertension   Atrial fibrillation ([employer] [employer])   Irritable bowel syndrome   Acute respiratory failure with hypoxia (H[facility])   Rhinovirus infection   Discharge Condition: stable  Diet recommendation: Heart healthy  Filed Weights   08/05/15 0427 08/06/15 0600 08/06/15 1609  Weight: 91.4 kg (201 lb 8 oz) 90.1 kg (198 lb 10.2 oz) 88.8 kg (195 lb 12.3 oz)    History of present illness:  From prior PN: 81 year old female patient with history of empyema, HTN, MVP, A. fib, HLD, hypothyroid, pacemaker, overactive bladder, was admitted to [facility] on 08/01/15 with complaints of sore throat, dyspnea and productive cough. Initially seen at an urgent care center, diagnosed with pneumonia and treated with IM antibiotics and discharged home with prednisone, doxycycline and nitrofurantoin. No improvement seen and hence she called EMS and found to be mildly hypoxic at 92%. Initially admitted by hospitalist service for community-acquired pneumonia. Early morning 10/20, patient decompensated with worsening dyspnea and hypoxia. CCM consulted, patient was intubated and transferred to ICU. After extubation and stabilization, she was transferred back to the floor and [place] service on 10/24.   Hospital Course:  Community-acquired viral pneumonia - Treated empirically with IV Rocephin, azithromycin and briefly with vancomycin - Blood cultures 2: Negative, sputum culture: Normal flora and RSV panel: Positive for rhinovirus - Improving. Added mucolytic.   Acute hypoxic respiratory  failure/VDRF - This was secondary to rhinovirus pneumonia acute lung injury  Acute on chronic diastolic CHF - continue home medication regimen. Transitioned to oral lasix.  A. Fib - Continue amiodarone, rate controlled. - Anticoagulated with Coumadin. CHADSVASC 4 (based on age, HTN, gender)  Essential hypertension - continue atenolol - holding arb due to elevated S creatinine  Complete heart block status post PPM  HLD  Acute on stage III chronic kidney disease/overactive bladder - Improved  GERD - PPI while patient was in house. May consider continuing as outpatient should this be a continued problem.   Hypothyroid - Continue Synthroid   Normocytic anemia - Stable  Stage 2 pressure ulcer on sacrum, present on admission - per [facility] 10/24  Procedures:  None  Consultations:  None  Discharge Exam: Filed Vitals:   08/10/15 1100  BP:   Pulse: 98  Temp:   Resp:     General: Pt in nad, alert and awake Cardiovascular: s1 and s2 present, no rubs Respiratory: mild exp wheeze, no rhales, equal chest rise.  Discharge Instructions   Discharge Instructions    Call MD for:  difficulty breathing, headache or visual disturbances    Complete by:  As directed      Call MD for:  severe uncontrolled pain    Complete by:  As directed      Call MD for:  temperature >100.4    Complete by:  As directed      Diet - low sodium heart healthy    Complete by:  As directed      Increase activity slowly    Complete by:  As directed           Current Discharge Medication List  CONTINUE these medications which have CHANGED   Details  furosemide (LASIX) 20 MG tablet Take 1 tablet (20 mg total) by mouth daily. Qty: 30 tablet    warfarin (COUMADIN) 2 MG tablet TAKE 1 TABLET DAILY BY MOUTH ON MONDAYS AND 1/2 TABLET ON ALL OTHER DAYS OF THE WEEK OR AS DIRECTED Qty: 1 tablet, Refills: 0      CONTINUE these medications which have NOT CHANGED   Details  amiodarone (PACERONE)  200 MG tablet Take 1 tablet (200 mg total) by mouth daily. Qty: 90 tablet, Refills: 3    atenolol (TENORMIN) 50 MG tablet TAKE ONE AND ONE-HALF TABLETS DAILY Qty: 135 tablet, Refills: 2    cholecalciferol (VITAMIN D) 1000 UNITS tablet Take 1,000 Units by mouth daily.    esomeprazole (NEXIUM) 40 MG capsule TAKE 1 CAPSULE DAILY BEFORE BREAKFAST Qty: 90 capsule, Refills: 2    gabapentin (NEURONTIN) 300 MG capsule TAKE 1 CAPSULE TWICE A DAY Qty: 180 capsule, Refills: 3    Multiple Vitamin (MULTIVITAMIN WITH MINERALS) TABS tablet Take 1 tablet by mouth daily.    polyvinyl alcohol (ARTIFICIAL TEARS) 1.4 % ophthalmic solution Place 1 drop into both eyes 3 (three) times daily as needed for dry eyes.    pravastatin (PRAVACHOL) 40 MG tablet Take 1 tablet (40 mg total) by mouth daily. Qty: 10 tablet, Refills: 0    vitamin B-12 (CYANOCOBALAMIN) 500 MCG tablet Take 500 mcg by mouth daily.    levothyroxine (SYNTHROID, LEVOTHROID) 125 MCG tablet TAKE 1 TABLET DAILY BEFORE BREAKFAST Qty: 90 tablet, Refills: 1      STOP taking these medications     ibuprofen (ADVIL,MOTRIN) 200 MG tablet      loperamide (IMODIUM) 2 MG capsule      mirabegron ER (MYRBETRIQ) 50 MG TB24      valsartan-hydrochlorothiazide (DIOVAN-HCT) 80-12.5 MG per tablet        Allergies  Allergen Reactions  . Cymbalta [Duloxetine Hcl] Other (See Comments)    Could not think straight  . Iodinated Diagnostic Agents Other (See Comments)    Pt was not told what the reaction was  . Other Itching    Dial Soap      The results of significant diagnostics from this hospitalization (including imaging, microbiology, ancillary and laboratory) are listed below for reference.    Significant Diagnostic Studies: Dg Chest 2 View  08/01/2015  CLINICAL DATA:  Dyspnea. EXAM: CHEST  2 VIEW COMPARISON:  01/31/2015 chest radiograph FINDINGS: Do lead right subclavian pacemaker is stable in configuration with lead tips overlying the  right atrium and right ventricle. Stable cardiomediastinal silhouette with mild cardiomegaly. No pneumothorax. No pleural effusion. No pulmonary edema. Mild patchy opacity at the posterior costophrenic angle on the lateral view. Minimal curvilinear opacity at the left costophrenic angle on the frontal view. IMPRESSION: 1. Mild patchy opacity at the posterior costophrenic angle on the lateral view, which could represent a mild left lower lobe pneumonia. 2. Minimal scarring versus atelectasis at the lateral left lung base. 3. Stable mild cardiomegaly without pulmonary edema. 4. Recommend follow-up post treatment chest radiographs in 6-8 weeks. Electronically Signed   By: Ilona Sorrel M.D.   On: 08/01/2015 21:08   Dg Abd 1 View  08/03/2015  CLINICAL DATA:  Nasogastric tube placement. EXAM: ABDOMEN - 1 VIEW COMPARISON:  CT 07/21/2006 FINDINGS: Soft tissue structures are unremarkable. Surgical clip right upper quadrant. NG tube noted with tip in the upper portion of the stomach. Side port at the gastroesophageal junction.  Advancement should be considered. No bowel distention. No free air. Right base pleural parenchymal scarring. IMPRESSION: NG tube noted with tip in the upper portion stomach. Side hole at the gastroesophageal junction. Further advancement of approximately 8 cm suggested. No bowel distention Electronically Signed   By: Marcello Moores  Register   On: 08/03/2015 08:22   Dg Chest Port 1 View  08/06/2015  CLINICAL DATA:  Endotracheal tube, hypertension, former smoker, atrial fibrillation EXAM: PORTABLE CHEST 1 VIEW COMPARISON:  Portable exam 0513 hours compared to 08/04/2015 FINDINGS: RIGHT subclavian pacemaker leads project over RIGHT atrium and RIGHT ventricle. Enlargement of cardiac silhouette with slight vascular congestion. Mediastinal contours normal. Scattered atelectasis RIGHT mid lung. Central bronchitic changes. Minimal perihilar infiltrates, improved. No pleural effusion or pneumothorax.  IMPRESSION: Improved perihilar infiltrates. Electronically Signed   By: Lavonia Dana M.D.   On: 08/06/2015 08:43   Dg Chest Port 1 View  08/04/2015  CLINICAL DATA:  Community acquired pneumonia. EXAM: PORTABLE CHEST 1 VIEW COMPARISON:  Chest radiograph from one day prior. FINDINGS: Endotracheal tube tip is 3.9 cm above the carina. Enteric tube enters the stomach with the tip not seen on this image. Stable of do lead right subclavian pacemaker with lead tips overlying the right atrium and right ventricle. Stable cardiomediastinal silhouette with normal heart size. No pneumothorax. Possible new trace right pleural effusion. No left pleural effusion. Patchy consolidation is noted in the bilateral mid lungs, slightly worse on the left. IMPRESSION: 1. Well-positioned lines and tubes. 2. Stable patchy consolidation in the bilateral mid lungs, slightly worse on the left, likely multifocal pneumonia. Electronically Signed   By: Ilona Sorrel M.D.   On: 08/04/2015 07:34   Dg Chest Port 1 View  08/03/2015  CLINICAL DATA:  Acute respiratory failure. On ventilator. Tachypnea. Hypoxia. EXAM: PORTABLE CHEST 1 VIEW COMPARISON:  08/01/2015 FINDINGS: New endotracheal tube and nasogastric tube are seen in appropriate position. Transvenous pacemaker remains in appropriate position. No pneumothorax visualized. Bilateral upper lobe airspace disease is seen which is new since previous study. No definite pleural effusions seen. Heart size is stable. IMPRESSION: Endotracheal tube and nasogastric tube in appropriate position. New bilateral upper lobe airspace disease. Electronically Signed   By: Earle Gell M.D.   On: 08/03/2015 07:48    Microbiology: Recent Results (from the past 240 hour(s))  Culture, blood (routine x 2) Call MD if unable to obtain prior to antibiotics being given     Status: None   Collection Time: 08/01/15 11:52 PM  Result Value Ref Range Status   Specimen Description BLOOD LEFT ANTECUBITAL  Final    Special Requests BOTTLES DRAWN AEROBIC AND ANAEROBIC 5CC  Final   Culture NO GROWTH 5 DAYS  Final   Report Status 08/07/2015 FINAL  Final  Culture, blood (routine x 2) Call MD if unable to obtain prior to antibiotics being given     Status: None   Collection Time: 08/01/15 11:57 PM  Result Value Ref Range Status   Specimen Description BLOOD LEFT ANTECUBITAL  Final   Special Requests BOTTLES DRAWN AEROBIC AND ANAEROBIC 5CC  Final   Culture NO GROWTH 5 DAYS  Final   Report Status 08/07/2015 FINAL  Final  Culture, sputum-assessment     Status: None   Collection Time: 08/02/15  1:08 PM  Result Value Ref Range Status   Specimen Description EXPECTORATED SPUTUM  Final   Special Requests NONE  Final   Sputum evaluation   Final    THIS SPECIMEN IS ACCEPTABLE. RESPIRATORY  CULTURE REPORT TO FOLLOW.   Report Status 08/02/2015 FINAL  Final  Culture, respiratory (NON-Expectorated)     Status: None   Collection Time: 08/02/15  1:08 PM  Result Value Ref Range Status   Specimen Description SPUTUM  Final   Special Requests NONE  Final   Gram Stain   Final    MODERATE WBC PRESENT, PREDOMINANTLY PMN FEW SQUAMOUS EPITHELIAL CELLS PRESENT RARE GRAM POSITIVE RODS RARE GRAM NEGATIVE RODS RARE YEAST    Culture   Final    NORMAL OROPHARYNGEAL FLORA Performed at [facility]    Report Status 08/05/2015 FINAL  Final  MRSA PCR Screening     Status: None   Collection Time: 08/03/15  7:30 AM  Result Value Ref Range Status   MRSA by PCR NEGATIVE NEGATIVE Final    Comment:        The GeneXpert MRSA Assay (FDA approved for NASAL specimens only), is one component of a comprehensive MRSA colonization surveillance program. It is not intended to diagnose MRSA infection nor to guide or monitor treatment for MRSA infections.   Culture, respiratory (NON-Expectorated)     Status: None   Collection Time: 08/03/15  8:48 AM  Result Value Ref Range Status   Specimen Description ENDOTRACHEAL  Final    Special Requests NONE  Final   Gram Stain   Final    ABUNDANT WBC PRESENT,BOTH PMN AND MONONUCLEAR RARE SQUAMOUS EPITHELIAL CELLS PRESENT NO ORGANISMS SEEN Performed at [facility]    Culture   Final    RARE STAPHYLOCOCCUS AUREUS Note: RIFAMPIN AND GENTAMICIN SHOULD NOT BE USED AS SINGLE DRUGS FOR TREATMENT OF STAPH INFECTIONS. This organism DOES NOT demonstrate inducible Clindamycin resistance in vitro. RARE CANDIDA ALBICANS Performed at [facility]    Report Status 08/07/2015 FINAL  Final   Organism ID, Bacteria STAPHYLOCOCCUS AUREUS  Final      Susceptibility   Staphylococcus aureus - MIC*    CLINDAMYCIN <=0.25 SENSITIVE Sensitive     ERYTHROMYCIN >=8 RESISTANT Resistant     GENTAMICIN <=0.5 SENSITIVE Sensitive     LEVOFLOXACIN 4 INTERMEDIATE Intermediate     OXACILLIN 0.5 SENSITIVE Sensitive     RIFAMPIN <=0.5 SENSITIVE Sensitive     TRIMETH/SULFA <=10 SENSITIVE Sensitive     VANCOMYCIN 1 SENSITIVE Sensitive     TETRACYCLINE <=1 SENSITIVE Sensitive     MOXIFLOXACIN 2 RESISTANT Resistant     * RARE STAPHYLOCOCCUS AUREUS  Respiratory virus panel     Status: Abnormal   Collection Time: 08/04/15 10:50 AM  Result Value Ref Range Status   Respiratory Syncytial Virus A Negative Negative Final   Respiratory Syncytial Virus B Negative Negative Final   Influenza A Negative Negative Final   Influenza B Negative Negative Final   Parainfluenza 1 Negative Negative Final   Parainfluenza 2 Negative Negative Final   Parainfluenza 3 Negative Negative Final   Metapneumovirus Negative Negative Final   Rhinovirus Positive (A) Negative Final   Adenovirus Negative Negative Final    Comment: (NOTE) Performed At: [facility] [address] [place], [place] [id number] Lindon Romp MD [id number]   Culture, respiratory (NON-Expectorated)     Status: None   Collection Time: 08/04/15 11:35 AM  Result Value Ref Range Status   Specimen Description TRACHEAL  ASPIRATE  Final   Special Requests NONE  Final   Gram Stain   Final    NO WBC SEEN NO SQUAMOUS EPITHELIAL CELLS SEEN NO ORGANISMS SEEN Performed at [facility]    Culture   Final    NO GROWTH 2 DAYS Performed at [facility]    Report Status 08/06/2015 FINAL  Final     Labs: Basic Metabolic Panel:  Recent Labs Lab 08/04/15 0333 08/05/15 0542 08/06/15 1130 08/07/15 0458 08/09/15 0512  NA 142 137 141 145 141  K 4.1 3.4* 4.5 3.7 4.4  CL 105 101 102 104 98*  CO2 26 28 27 31 29   GLUCOSE 108* 136* 148* 117* 102*  BUN 29* 38* 30* 28* 27*  CREATININE 1.59* 1.43* 1.29* 1.24* 1.51*  CALCIUM 9.1 8.4* 9.1 9.5 9.3  MG  --  2.0 1.9 2.0  --   PHOS  --  3.4 3.1 3.7  --    Liver Function Tests:  Recent Labs Lab 08/04/15 0333  AST 32  ALT 27  ALKPHOS 80  BILITOT 0.4  PROT 6.2*  ALBUMIN 2.9*   No results for input(s): LIPASE, AMYLASE in the last 168 hours. No results for input(s): AMMONIA in the last 168 hours. CBC:  Recent Labs Lab 08/04/15 0333 08/05/15 0542 08/06/15 1130 08/07/15 0458 08/09/15 0512  WBC 8.5 7.8 8.7 7.9 7.7  HGB 11.8* 9.8* 10.7* 10.1* 10.2*  HCT 37.2 29.8* 33.3* 31.2* 32.2*  MCV 96.4 95.5 95.7 95.4 96.4  PLT 139* 131* 167 173 182   Cardiac Enzymes:  Recent Labs Lab 08/03/15 1430 08/03/15 2040 08/04/15 0333  CKTOTAL  --   --  47  TROPONINI 0.04* 0.03  --    BNP: BNP (last 3 results)  Recent Labs  08/01/15 2009  BNP 585.2*    ProBNP (last 3 results) No results for input(s): PROBNP in the last 8760 hours.  CBG:  Recent Labs Lab 08/07/15 0430 08/07/15 0752 08/07/15 1208 08/07/15 1649 08/07/15 2145  GLUCAP 108* 129* 110* 83 87       Signed:  Velvet Bathe  [facility] 08/10/2015, 11:46 AM   OK for discharge.

## 2015-08-09 NOTE — Progress Notes (Signed)
Speech Language Pathology Treatment: Dysphagia  Patient Details Name: Jill Washington MRN: [id number] DOB: 03/10/1926 Today's Date: 08/09/2015 Time: 0840-0900 SLP Time Calculation (min) (ACUTE ONLY): 20 min  Assessment / Plan / Recommendation Clinical Impression  Skilled observation of breakfast tray including regular/thin (without straw) consumption without s/s of aspiration noted.  Pt exhibited a dry cough without po trials, but she stated this "happens when I am not eating also."  Pt utilizing all compensatory strategies including no straws, small sips, small bites and slow rate while consuming current diet.  Pt stated she "takes breaks" and has not had a good appetite since becoming ill.  ST to s/o at this time d/t pt meeting current goals for dysphagia intervention.   HPI Other Pertinent Information: Jill Washington is a 81 y.o. female with past medical history of pleural empyema, hypertension, mitral valve prolapse, atrial fibrillation, sick sinus syndrome, hyperlipidemia, hypothyroidism, hiatal hernia, IBS, fibromyalgia who comes to the emergency department due to complaints of shortness of breath for 1 day. She states that yesterday she was feeling well, but later in the evening felt "achy", went to bed and woke up around 1:30 AM with sore throat, dyspnea and occasionally productive of yellowish sputum cough. Presents with Community Acquired Pneumonia, intubated 10/20-10/22 due to respiratory insufficiency. Pt currently on full liquid diet, not previously seen by speech.    Pertinent Vitals Pain Assessment: No/denies pain  SLP Plan  Discharge SLP treatment due to (comment) (goals met; pt independent)    Recommendations Diet recommendations: Regular;Thin liquid Liquids provided via: Cup Medication Administration: Whole meds with liquid Compensations: Slow rate;Small sips/bites Postural Changes and/or Swallow Maneuvers: Seated upright 90 degrees              Oral Care  Recommendations: Oral care BID Follow up Recommendations: None Plan: Discharge SLP treatment due to (comment) (goals met; pt independent)         ADAMS,PAT, M.S., CCC-SLP 08/09/2015, 9:05 AM    

## 2015-08-09 NOTE — Clinical Social Work Placement (Signed)
   CLINICAL SOCIAL WORK PLACEMENT  NOTE  Date:  08/09/2015  Patient Details  Name: Jill Washington MRN: [id number] Date of Birth: 12/10/1925  Clinical Social Work is seeking post-discharge placement for this patient at the [place] level of care (*CSW will initial, date and re-position this form in  chart as items are completed):  Yes   Patient/family provided with [facility] Work Department's list of facilities offering this level of care within the geographic area requested by the patient (or if unable, by the patient's family).  Yes   Patient/family informed of their freedom to choose among providers that offer the needed level of care, that participate in Medicare, Medicaid or managed care program needed by the patient, have an available bed and are willing to accept the patient.  Yes   Patient/family informed of [facility]'s ownership interest in [facility] and [facility], as well as of the fact that they are under no obligation to receive care at these facilities.  PASRR submitted to EDS on 08/09/15     PASRR number received on 08/09/15     Existing PASRR number confirmed on       FL2 transmitted to all facilities in geographic area requested by pt/family on 08/09/15     FL2 transmitted to all facilities within larger geographic area on       Patient informed that his/her managed care company has contracts with or will negotiate with certain facilities, including the following:            Patient/family informed of bed offers received.  Patient chooses bed at       Physician recommends and patient chooses bed at      Patient to be transferred to   on  .  Patient to be transferred to facility by       Patient family notified on   of transfer.  Name of family member notified:        PHYSICIAN Please sign FL2     Additional Comment:    _______________________________________________ Cranford Mon, LCSW 08/09/2015,  4:06 PM

## 2015-08-09 NOTE — Progress Notes (Signed)
[facility] for Warfarin Indication: atrial fibrillation  Allergies  Allergen Reactions  . Cymbalta [Duloxetine Hcl] Other (See Comments)    Could not think straight  . Iodinated Diagnostic Agents Other (See Comments)    Pt was not told what the reaction was  . Other Itching    Dial Soap   Assessment: 81 yo F to be continued on home warfarin for afib.  Warfarin PTA dose = 1 mg every day except 2 mg on Mondays (8 mg weekly). Last dose PTA was 10/18 prior to coming to the hospital.  INR up to 2.76 today  Goal of Therapy:  INR 2-3 Monitor platelets by anticoagulation protocol: Yes   Plan:  -Lower dose tonight - 0.5 mg po x 1 -Daily INR      Patient Measurements: Height: 5\' 7"  (170.2 cm) Weight: 195 lb 12.3 oz (88.8 kg) IBW/kg (Calculated) : 61.6  Vital Signs: Temp: 98.9 F (37.2 C) (10/26 0633) Temp Source: Oral (10/26 [id number]) BP: 145/60 mmHg (10/26 [id number]) Pulse Rate: 83 (10/26 1002)  Labs:  Recent Labs  08/06/15 1130 08/07/15 0458 08/08/15 0616 08/09/15 0512  HGB 10.7* 10.1*  --  10.2*  HCT 33.3* 31.2*  --  32.2*  PLT 167 173  --  182  LABPROT 20.6* 22.7* 24.9* 28.8*  INR 1.77* 2.02* 2.28* 2.76*  CREATININE 1.29* 1.24*  --  1.51*    Estimated Creatinine Clearance: 30 mL/min (by C-G formula based on Cr of 1.51).  Thank you Anette Guarneri, PharmD [phone] 08/09/2015, 10:56 AM

## 2015-08-09 NOTE — Care Management Note (Signed)
Case Management Note  Patient Details  Name: Jill Washington MRN: [id number] Date of Birth: January 14, 1926  Subjective/Objective:                 Patient admitted with PNA. Will DC to SNF today, facilitated through SW   Action/Plan:   Expected Discharge Date:                  Expected Discharge Plan:  [place]  In-House Referral:     Discharge planning Services  CM Consult  Post Acute Care Choice:    Choice offered to:     DME Arranged:    DME Agency:     HH Arranged:    [facility] Agency:     Status of Service:  Completed, signed off  Medicare Important Message Given:  Yes-third notification given Date Medicare IM Given:    Medicare IM give by:    Date Additional Medicare IM Given:    Additional Medicare Important Message give by:     If discussed at [place] of Stay Meetings, dates discussed:    Additional Comments:  Carles Collet, RN 08/09/2015, 2:05 PM

## 2015-08-09 NOTE — Clinical Social Work Note (Signed)
Clinical Social Work Assessment  Patient Details  Name: Jill Washington MRN: [id number] Date of Birth: [birth year]  Date of referral:  08/09/15               Reason for consult:  Facility Placement                Permission sought to share information with:  Family Supports, Chartered certified accountant granted to share information::  Yes, Verbal Permission Granted  Name::     [facility] SNF's, Daughter and other family members as needed  Agency::     Relationship::     Contact Information:     Housing/Transportation Living arrangements for the past 2 months:  Mobile Home Source of Information:  Patient Patient Interpreter Needed:  None Criminal Activity/Legal Involvement Pertinent to Current Situation/Hospitalization:  No - Comment as needed Significant Relationships:  Adult Children Lives with:  Self Do you feel safe going back to the place where you live?  Yes (Wants to to home but feels she needs rehab) Need for family participation in patient care:  Yes (Comment)  Care giving concerns:  "I live alone in a fairly isolated area. I've fallen 3 times but was able to get up each time." Patient wants to get "stronger".  Social Worker assessment / plan:  CSW met with patient this evening to discuss PT's recommendation for short term rehab. Patient indicated that she wants to seek placement as she lives alone and does not want to be a burden on her family.  Patient states that she lives in [place] and would prefer placement in [place] or [place] area is possible. She was agreeable to bed search. Fl2 completed and bed search initiated.  Per MD- patient is medically stable for d/c to SNF. Will obtain bed offers and plan d/c in the morning. She is not safe to return home today alone and does not have family who could stay with her. She is very concerned about being a fall risk. Fl2 in Epic for MD's signature.  Insurance information:  Medicare PT Recommendations:   [place] / Referral to community resources:  [place]  Patient/Family's Response to care: Patient states that she is feeling much better and is "ready and wiling" to go to a SNF for short term rehab. Her goal is to return home to independent status as soon as possible.  Patient/Family's Understanding of and Emotional Response to Diagnosis, Current Treatment, and Prognosis:   Patient exhibits a very positive attitude about need for rehab and is able to verbalize a strong understanding of her current diagnosis, treatment and prognosis. She feels that with therapy she will become strong and safe to be able to return home.  Patient's utilization of humor was noted as well as a very appropriate positive outlook on her current medical condition will work in her favor as she seeks rehab. She is agreeable to SNF and awaiting offers.    Emotional Assessment Appearance:  Appears stated age Attitude/Demeanor/Rapport:   (Cooperative, involved/engaged, rational and very postivie attitude) Affect (typically observed):  Pleasant, Calm, Happy Orientation:  Oriented to Self, Oriented to Place, Oriented to  Time Alcohol / Substance use:  Tobacco Use (Smoked when she was in her 19's. Quit in 20's) Psych involvement (Current and /or in the community):  No (Comment)  Discharge Needs  Concerns to be addressed:  Care Coordination Readmission within the last 30 days:  No Current discharge risk:  None Barriers to  Discharge:  Continued Medical Work up, No Barriers Identified   Estill Bakes 08/09/2015, 6:00 pm

## 2015-08-09 NOTE — Progress Notes (Signed)
Physical Therapy Treatment Patient Details Name: WHITNEE NOBLIT MRN: [id number] DOB: 1926-10-04 Today's Date: 08/09/2015    History of Present Illness Pt adm with PNA and intubated 10/20. Extubated 10/22. Pt with viral rhinovirus lung injury. PMH - pleural empyema, hypertension, mitral valve prolapse, atrial fibrillation, sick sinus syndrome    PT Comments    Pt is going along well today with limited tolerance for mobility due to sats.  Very appropriate given her condition for SNF to allow better endurance and stability to get home.  Follow Up Recommendations  SNF     Equipment Recommendations  None recommended by PT (await SNF to decide)    Recommendations for Other Services Rehab consult     Precautions / Restrictions Precautions Precautions: Fall Restrictions Weight Bearing Restrictions: No    Mobility  Bed Mobility Overal bed mobility: Needs Assistance Bed Mobility: Supine to Sit;Rolling Rolling: Min assist   Supine to sit: Min assist        Transfers Overall transfer level: Needs assistance Equipment used: Rolling walker (2 wheeled) Transfers: Sit to/from [facility] Sit to Stand: Mod assist Stand pivot transfers: Min assist       General transfer comment: cued hands and verbalcues for sequence  Ambulation/Gait             General Gait Details: O2 sats were too low even sitting for a longer walk   Stairs            Wheelchair Mobility    Modified Rankin (Stroke Patients Only)       Balance Overall balance assessment: Needs assistance Sitting-balance support: Feet supported Sitting balance-Leahy Scale: Good     Standing balance support: Bilateral upper extremity supported Standing balance-Leahy Scale: Poor                      Cognition Arousal/Alertness: Awake/alert Behavior During Therapy: WFL for tasks assessed/performed Overall Cognitive Status: Within Functional Limits for tasks assessed                      Exercises      General Comments General comments (skin integrity, edema, etc.): Pt had a challenging day with sats, with drop to 82% sitting then recovered, to [facility] with sats down to 78% then recovered to 96% and then to chair.  At every step she had very big drop and awaited a short recovery.  Not stable enough on O2 to attempt to walk more than at bedside      Pertinent Vitals/Pain Pain Assessment: Faces Faces Pain Scale: Hurts whole lot Pain Location: feet upon  standign Pain Descriptors / Indicators: Pins and needles Pain Intervention(s): Limited activity within patient's tolerance;Monitored during session;Premedicated before session;Repositioned    Home Living                      Prior Function            PT Goals (current goals can now be found in the care plan section) Acute Rehab PT Goals Patient Stated Goal: Return home eventually Progress towards PT goals: Progressing toward goals    Frequency  Min 3X/week    PT Plan Current plan remains appropriate    Co-evaluation             End of Session Equipment Utilized During Treatment: Oxygen Activity Tolerance: Patient limited by fatigue Patient left: in chair;with call bell/phone within reach;with chair alarm set  Time: [id number] PT Time Calculation (min) (ACUTE ONLY): 31 min  Charges:  $Therapeutic Activity: 23-37 mins                    G Codes:      Ramond Dial 2015-08-19, 9:19 AM  Mee Hives, PT MS Acute Rehab Dept. Number: [facility] [phone] and [place] [phone]

## 2015-08-10 DIAGNOSIS — I5033 Acute on chronic diastolic (congestive) heart failure: Secondary | ICD-10-CM | POA: Diagnosis present

## 2015-08-10 DIAGNOSIS — M545 Low back pain: Secondary | ICD-10-CM | POA: Diagnosis not present

## 2015-08-10 DIAGNOSIS — L89150 Pressure ulcer of sacral region, unstageable: Secondary | ICD-10-CM | POA: Diagnosis not present

## 2015-08-10 DIAGNOSIS — I4891 Unspecified atrial fibrillation: Secondary | ICD-10-CM | POA: Diagnosis present

## 2015-08-10 DIAGNOSIS — E038 Other specified hypothyroidism: Secondary | ICD-10-CM | POA: Diagnosis not present

## 2015-08-10 DIAGNOSIS — D649 Anemia, unspecified: Secondary | ICD-10-CM | POA: Diagnosis present

## 2015-08-10 DIAGNOSIS — J9601 Acute respiratory failure with hypoxia: Secondary | ICD-10-CM | POA: Diagnosis present

## 2015-08-10 DIAGNOSIS — M6281 Muscle weakness (generalized): Secondary | ICD-10-CM | POA: Diagnosis present

## 2015-08-10 DIAGNOSIS — N3281 Overactive bladder: Secondary | ICD-10-CM | POA: Diagnosis not present

## 2015-08-10 DIAGNOSIS — J189 Pneumonia, unspecified organism: Secondary | ICD-10-CM | POA: Diagnosis not present

## 2015-08-10 DIAGNOSIS — L89152 Pressure ulcer of sacral region, stage 2: Secondary | ICD-10-CM | POA: Diagnosis present

## 2015-08-10 DIAGNOSIS — I10 Essential (primary) hypertension: Secondary | ICD-10-CM | POA: Diagnosis not present

## 2015-08-10 DIAGNOSIS — N182 Chronic kidney disease, stage 2 (mild): Secondary | ICD-10-CM | POA: Diagnosis present

## 2015-08-10 DIAGNOSIS — B3789 Other sites of candidiasis: Secondary | ICD-10-CM | POA: Diagnosis not present

## 2015-08-10 DIAGNOSIS — B348 Other viral infections of unspecified site: Secondary | ICD-10-CM | POA: Diagnosis present

## 2015-08-10 MED ORDER — WARFARIN SODIUM 1 MG PO TABS
1.0000 mg | ORAL_TABLET | Freq: Once | ORAL | Status: DC
  Filled 2015-08-10: qty 1

## 2015-08-10 NOTE — Progress Notes (Signed)
Nsg Discharge Note  Admit Date:  08/01/2015 Discharge date: 08/10/2015   Jill Washington to be D/C'd Nursing Home per MD order.  AVS completed.  Copy for chart, and copy for patient signed, and dated. Patient/caregiver able to verbalize understanding.  Discharge Medication:   Medication List    STOP taking these medications        ibuprofen 200 MG tablet  Commonly known as:  ADVIL,MOTRIN     loperamide 2 MG capsule  Commonly known as:  IMODIUM     MYRBETRIQ 50 MG Tb24 tablet  Generic drug:  mirabegron ER     valsartan-hydrochlorothiazide 80-12.5 MG tablet  Commonly known as:  DIOVAN-HCT      TAKE these medications        amiodarone 200 MG tablet  Commonly known as:  PACERONE  Take 1 tablet (200 mg total) by mouth daily.     ARTIFICIAL TEARS 1.4 % ophthalmic solution  Generic drug:  polyvinyl alcohol  Place 1 drop into both eyes 3 (three) times daily as needed for dry eyes.     atenolol 50 MG tablet  Commonly known as:  TENORMIN  TAKE ONE AND ONE-HALF TABLETS DAILY     cholecalciferol 1000 UNITS tablet  Commonly known as:  VITAMIN D  Take 1,000 Units by mouth daily.     esomeprazole 40 MG capsule  Commonly known as:  NEXIUM  TAKE 1 CAPSULE DAILY BEFORE BREAKFAST     furosemide 20 MG tablet  Commonly known as:  LASIX  Take 1 tablet (20 mg total) by mouth daily.     gabapentin 300 MG capsule  Commonly known as:  NEURONTIN  TAKE 1 CAPSULE TWICE A DAY     levothyroxine 125 MCG tablet  Commonly known as:  SYNTHROID, LEVOTHROID  TAKE 1 TABLET DAILY BEFORE BREAKFAST     multivitamin with minerals Tabs tablet  Take 1 tablet by mouth daily.     pravastatin 40 MG tablet  Commonly known as:  PRAVACHOL  Take 1 tablet (40 mg total) by mouth daily.     vitamin B-12 500 MCG tablet  Commonly known as:  CYANOCOBALAMIN  Take 500 mcg by mouth daily.     warfarin 2 MG tablet  Commonly known as:  COUMADIN  TAKE 1 TABLET DAILY BY MOUTH ON MONDAYS AND 1/2 TABLET ON  ALL OTHER DAYS OF THE WEEK OR AS DIRECTED        Discharge Assessment: Filed Vitals:   08/10/15 1403  BP: 133/52  Pulse: 72  Temp: 98.1 F (36.7 C)  Resp: 18   Stage II on sacrum.   IV catheter discontinued intact. Site without signs and symptoms of complications - no redness or edema noted at insertion site, patient denies c/o pain - only slight tenderness at site.  Dressing with slight pressure applied.  D/c Instructions-Education: Patient transported to SNF via [place].  Report called to [facility]  Kamerin Grumbine Margaretha Sheffield, RN 08/10/2015 4:23 PM

## 2015-08-10 NOTE — Progress Notes (Signed)
Occupational Therapy Treatment Patient Details Name: Jill Washington MRN: [id number] DOB: [birth year] Today's Date: 08/10/2015    History of present illness Pt adm with PNA and intubated 10/20. Extubated 10/22. Pt with viral rhinovirus lung injury. PMH - pleural empyema, hypertension, mitral valve prolapse, atrial fibrillation, sick sinus syndrome   OT comments  Patient had intentions of performing more activity, but after toileting task, pt reports, "I'm just worn out." Assisted patient back to recliner at end of session. SNF Rehab is appropriate d/c plan given lack of activity tolerance and endurance impacting ADL.    Follow Up Recommendations  SNF;Supervision/Assistance - 24 hour    Equipment Recommendations  3 in 1 bedside comode    Recommendations for Other Services      Precautions / Restrictions Precautions Precautions: Fall       Mobility Bed Mobility               General bed mobility comments: NT -- OOB in recliner  Transfers Overall transfer level: Needs assistance Equipment used: Rolling walker (2 wheeled) Transfers: Sit to/from Stand Sit to Stand: Min assist Stand pivot transfers: Min assist       General transfer comment: cued hands and verbalcues for sequence    Balance                                   ADL Overall ADL's : Needs assistance/impaired     Grooming: Wash/dry hands;Wash/dry face;Set up;Sitting                   Toilet Transfer: Minimal assistance;RW;BSC   Toileting- Clothing Manipulation and Hygiene: Minimal assistance;Sit to/from stand;Sitting/lateral lean       Functional mobility during ADLs: Minimal assistance;Rolling walker General ADL Comments: Still with decreased activity tolerance and weakness. She was able to ambulate about 5 ft to [facility] and back to recliner after toileting. Stated, "I'm just worn out."      Vision                     Perception     Praxis      Cognition    Behavior During Therapy: [facility] for tasks assessed/performed Overall Cognitive Status: Within Functional Limits for tasks assessed                       Extremity/Trunk Assessment               Exercises     Shoulder Instructions       General Comments      Pertinent Vitals/ Pain       Pain Assessment: Faces Faces Pain Scale: Hurts even more Pain Location: B feet while ambulating Pain Descriptors / Indicators: Pins and needles Pain Intervention(s): Limited activity within patient's tolerance;Monitored during session  Home Living                                          Prior Functioning/Environment              Frequency Min 2X/week     Progress Toward Goals  OT Goals(current goals can now be found in the care plan section)  Progress towards OT goals: Progressing toward goals     Plan Discharge plan remains appropriate    Co-evaluation  End of Session Equipment Utilized During Treatment: Rolling walker   Activity Tolerance Patient limited by fatigue   Patient Left in chair;with call bell/phone within reach;with chair alarm set   Nurse Communication          Time: [phone] OT Time Calculation (min): 15 min  Charges: OT Treatments $Self Care/Home Management : 8-22 mins  Jill Washington A 08/10/2015, 12:36 PM

## 2015-08-10 NOTE — Progress Notes (Addendum)
Patient will discharge to [facility] Anticipated discharge date:08/10/15 Family notified: dtr at bedside Transportation by [facility]- called at 2:45pm  [place] signing off.  Domenica Reamer, [place] Social Worker [phone]

## 2015-08-11 ENCOUNTER — Other Ambulatory Visit: Payer: Self-pay | Admitting: Family Medicine

## 2015-08-11 ENCOUNTER — Ambulatory Visit: Payer: Medicare Other | Admitting: Pharmacist Clinician (PhC)/ Clinical Pharmacy Specialist

## 2015-08-11 ENCOUNTER — Ambulatory Visit: Payer: Medicare Other | Admitting: Cardiovascular Disease

## 2015-08-14 DIAGNOSIS — N3281 Overactive bladder: Secondary | ICD-10-CM | POA: Diagnosis not present

## 2015-08-14 DIAGNOSIS — I10 Essential (primary) hypertension: Secondary | ICD-10-CM | POA: Diagnosis not present

## 2015-08-14 DIAGNOSIS — M545 Low back pain: Secondary | ICD-10-CM | POA: Diagnosis not present

## 2015-08-16 DIAGNOSIS — B3789 Other sites of candidiasis: Secondary | ICD-10-CM | POA: Diagnosis not present

## 2015-08-16 DIAGNOSIS — L89152 Pressure ulcer of sacral region, stage 2: Secondary | ICD-10-CM | POA: Diagnosis not present

## 2015-08-17 DIAGNOSIS — E038 Other specified hypothyroidism: Secondary | ICD-10-CM | POA: Diagnosis not present

## 2015-08-23 DIAGNOSIS — L89150 Pressure ulcer of sacral region, unstageable: Secondary | ICD-10-CM | POA: Diagnosis not present

## 2015-08-28 DIAGNOSIS — I10 Essential (primary) hypertension: Secondary | ICD-10-CM | POA: Diagnosis not present

## 2015-08-29 ENCOUNTER — Telehealth: Payer: Self-pay | Admitting: Family Medicine

## 2015-08-29 ENCOUNTER — Other Ambulatory Visit: Payer: Self-pay | Admitting: Family Medicine

## 2015-08-29 ENCOUNTER — Ambulatory Visit: Payer: Medicare Other

## 2015-08-29 LAB — PROTIME-INR: INR: 10 — AB (ref 0.9–1.1)

## 2015-08-29 MED ORDER — PHYTONADIONE 5 MG PO TABS: 2.5000 mg | ORAL_TABLET | Freq: Once | ORAL | Status: DC

## 2015-08-29 NOTE — Telephone Encounter (Signed)
Patient was discharged from [facility] this morning. They just received her PT/INR results from [facility]- 90.0 INR- 10.0 Currently patient has been taking 2 mg on mondays and 1 mg Tuesday through Sunday. Patient is now home and was informed PCP will be contacting to give instructions on coumadin.

## 2015-08-29 NOTE — Telephone Encounter (Signed)
error:[id number] ° °

## 2015-08-29 NOTE — Telephone Encounter (Signed)
Patient contacted and informed of PCP instructions. She is having no bleeding.  Was informed to hold coumadin for 2 days. Sent in vitamin K 2.5 mg po once to [facility].  Patient has been scheduled to see PCP this coming Friday at 2:15 PM. Patient would like home health to come out to check PT/INR in 2 days.  Please put a referral in for home health.

## 2015-08-29 NOTE — Telephone Encounter (Signed)
PCP preferred the patient to come in to the office Friday to see the RN to check PT/INR and then see PCP for appointment.  Patient has been informed and nurse visit/Office visit have both been scheduled.

## 2015-08-30 DIAGNOSIS — I4891 Unspecified atrial fibrillation: Secondary | ICD-10-CM | POA: Diagnosis not present

## 2015-08-30 DIAGNOSIS — I5033 Acute on chronic diastolic (congestive) heart failure: Secondary | ICD-10-CM | POA: Diagnosis not present

## 2015-08-30 NOTE — Telephone Encounter (Signed)
Caller name: Neoma Laming with [facility] Can be reached: [phone]  Reason for call: notifying that pt was started with [facility] with [facility] after INR of 10 yesterday. They received orders from the facility she believes but we sent orders to [facility].

## 2015-08-31 ENCOUNTER — Telehealth: Payer: Self-pay | Admitting: Family Medicine

## 2015-08-31 NOTE — Telephone Encounter (Signed)
Caller name:Amber  Relation to pt:  RN from [facility] Call back number: [phone]    Reason for call:  INR 3.7 PT 44.8 taken yesterday afternoon

## 2015-08-31 NOTE — Telephone Encounter (Addendum)
Called patient to verify INR. States it ws 3.7 today. Information given to Provider per her nurse.

## 2015-08-31 NOTE — Telephone Encounter (Signed)
PCP aware

## 2015-09-01 ENCOUNTER — Ambulatory Visit (HOSPITAL_BASED_OUTPATIENT_CLINIC_OR_DEPARTMENT_OTHER)
Admission: RE | Admit: 2015-09-01 | Discharge: 2015-09-01 | Disposition: A | Discharge status: Home / Self Care | Payer: Medicare Other | Source: Ambulatory Visit | Attending: Family Medicine | Admitting: Family Medicine

## 2015-09-01 ENCOUNTER — Ambulatory Visit (INDEPENDENT_AMBULATORY_CARE_PROVIDER_SITE_OTHER): Payer: Medicare Other | Admitting: Family Medicine

## 2015-09-01 ENCOUNTER — Encounter: Payer: Self-pay | Admitting: Family Medicine

## 2015-09-01 VITALS — BP 140/82 | HR 91 | Temp 97.8°F | Ht 67.0 in | Wt 194.2 lb

## 2015-09-01 DIAGNOSIS — E559 Vitamin D deficiency, unspecified: Secondary | ICD-10-CM

## 2015-09-01 DIAGNOSIS — I4891 Unspecified atrial fibrillation: Secondary | ICD-10-CM | POA: Diagnosis not present

## 2015-09-01 DIAGNOSIS — J189 Pneumonia, unspecified organism: Secondary | ICD-10-CM | POA: Diagnosis not present

## 2015-09-01 DIAGNOSIS — J948 Other specified pleural conditions: Secondary | ICD-10-CM | POA: Diagnosis not present

## 2015-09-01 DIAGNOSIS — M5441 Lumbago with sciatica, right side: Secondary | ICD-10-CM

## 2015-09-01 DIAGNOSIS — E7800 Pure hypercholesterolemia, unspecified: Secondary | ICD-10-CM

## 2015-09-01 DIAGNOSIS — D509 Iron deficiency anemia, unspecified: Secondary | ICD-10-CM

## 2015-09-01 DIAGNOSIS — B348 Other viral infections of unspecified site: Secondary | ICD-10-CM

## 2015-09-01 LAB — POCT INR: INR: 2.5

## 2015-09-01 MED ORDER — FUROSEMIDE 20 MG PO TABS: 20.0000 mg | ORAL_TABLET | Freq: Every day | ORAL | Status: DC

## 2015-09-01 NOTE — Patient Instructions (Signed)

## 2015-09-01 NOTE — Progress Notes (Signed)
Pre visit review using our clinic review tool, if applicable. No additional management support is needed unless otherwise documented below in the visit note.  Patient had INR during visit. INR=2.5 will follow up with Cardiology per Dr. Charlett Blake.

## 2015-09-01 NOTE — Telephone Encounter (Signed)
Patient seen today for OV and nurse visit.

## 2015-09-04 ENCOUNTER — Ambulatory Visit (INDEPENDENT_AMBULATORY_CARE_PROVIDER_SITE_OTHER): Payer: Medicare Other | Admitting: Cardiovascular Disease

## 2015-09-04 ENCOUNTER — Ambulatory Visit (INDEPENDENT_AMBULATORY_CARE_PROVIDER_SITE_OTHER): Payer: Medicare Other | Admitting: Pharmacist Clinician (PhC)/ Clinical Pharmacy Specialist

## 2015-09-04 ENCOUNTER — Encounter: Payer: Self-pay | Admitting: Cardiovascular Disease

## 2015-09-04 VITALS — BP 132/70 | HR 70 | Ht 67.0 in | Wt 194.0 lb

## 2015-09-04 DIAGNOSIS — I480 Paroxysmal atrial fibrillation: Secondary | ICD-10-CM

## 2015-09-04 DIAGNOSIS — I4891 Unspecified atrial fibrillation: Secondary | ICD-10-CM | POA: Diagnosis not present

## 2015-09-04 DIAGNOSIS — I872 Venous insufficiency (chronic) (peripheral): Secondary | ICD-10-CM

## 2015-09-04 DIAGNOSIS — Z950 Presence of cardiac pacemaker: Secondary | ICD-10-CM

## 2015-09-04 DIAGNOSIS — Z7901 Long term (current) use of anticoagulants: Secondary | ICD-10-CM | POA: Diagnosis not present

## 2015-09-04 DIAGNOSIS — I482 Chronic atrial fibrillation, unspecified: Secondary | ICD-10-CM

## 2015-09-04 DIAGNOSIS — I442 Atrioventricular block, complete: Secondary | ICD-10-CM

## 2015-09-04 DIAGNOSIS — I5033 Acute on chronic diastolic (congestive) heart failure: Secondary | ICD-10-CM | POA: Diagnosis not present

## 2015-09-04 DIAGNOSIS — I10 Essential (primary) hypertension: Secondary | ICD-10-CM

## 2015-09-04 LAB — POCT INR: INR: 4

## 2015-09-04 NOTE — Progress Notes (Signed)
Patient ID: Jill Washington, female   DOB: [birth year], 81 y.o.   MRN: [id number]     Cardiology Office Note   Date:  09/04/2015   ID:  Jill Washington, DOB December 25, 1925, MRN [id number]  PCP:  [name], MD  Cardiologist:   Sanda Klein, MD   Chief Complaint  Patient presents with  . OFFICE VISIT  . Edema      History of Present Illness: Jill Washington is a 81 y.o. female who presents for  Pacemaker check in the setting of complete heart block and a history of paroxysmal atrial fibrillation.   She was hospitalized and intubated in late October for pneumonia related acute respiratory failure. She was discharged on October 24 to a nursing facility, but is now back at home. She still feels a little weak and has problems with back pain, as a residual cough. A chest x-ray performed a couple of days ago shows marked improvement in lung infiltrates, with some patchy residual abnormalities in the right base.  She denies anginal chest pain, syncope, palpitations. Chest chronic lower extremity edema with venous stasis dermatitis, no worse than usual. She has not had problems with fever or chills since hospital discharge.   Pacemaker interrogation shows normal device function. She has 82% atrial pacing and is pacemaker dependent with 100% ventricular pacing due to complete heart block. She has had an overall burden of atrial mode switch of 5.5% , but review of the electrograms shows atrial tachycardia /atypical atrial flutter with variable AV block, rather than true atrial fibrillation. The majority of the episodes of arrhythmia occurred around the time of her acute illness. She has had some fairly lengthy sustained episodes of atrial tachycardia as recently as today. The rhythm right now is AV sequential paced. The ventricular rate during episodes of atrial tachycardia is almost always less than 100 bpm.  Prior to initiation of amiodarone therapy, her burden of atrial fibrillation was 40-50%.  Until the last couple of months her burden of atrial fibrillation was less than 2%.  She has chronic stasis dermatitis and mild lower  Extremity edema related to peripheral venous insufficiency. She is on chronic warfarin anticoagulation and has not had stroke/TIA or any serious bleeding problems. Had normal coronary angiography 1998. A nuclear stress test performed November 2014 showed normal findings.  Her most recent echo was performed on October 21 and shows LVEF 55-60%, mildly dilated right ventricle  Past Medical History  Diagnosis Date  . History of empyema of pleura   . Hypertension   . Mitral valve prolapse   . History of atrial fibrillation   . Sick sinus syndrome ([place])   . Venous insufficiency   . Hypercholesteremia   . Hypothyroidism   . Hiatal hernia   . IBS (irritable bowel syndrome)   . Colon polyps     hyperplastic  . Fibromyalgia   . Vitamin D deficiency   . Peripheral neuropathy ([place])   . Critical illness polyneuropathy ([place])   . History of skin cancer     face  . Diverticulosis of sigmoid colon 02/05/2007    mild  . GERD (gastroesophageal reflux disease)   . Pacemaker   . Arthritis   . History of gallstones   . Fecal incontinence   . Urinary incontinence   . Blood transfusion   . Chicken pox as a child  . Measles as a child  . Mumps as a child  . Shingles 8 yrs ago  . Overactive bladder 05/07/2011  .  CHB (complete heart block) ([place]) 11/10/2014  . Medicare annual wellness visit, subsequent 05/15/2015    Allergies verified: UTD   Immunization Status: Flu vaccine-- NA  Tdap-- 9-10 years ago per patient  PNA-- 09/30/07 (23)  Shingles-- 04/07/14   A/P:  Changes to [place], [place] or Personal Hx: UTD Pap-- none recent  MMG--declines further MMgs (last 08/12/03 BI-RADS 1: Neg)  Bone Density-- 08/11/03 with Prescott Gum, MD- Normal, declines further testing CCS-- 05/07/12 with Silvano Rusk, MD- polyp removed- no     Past Surgical History  Procedure Laterality Date  . Cervical  spine surgery    . Cholecystectomy  03/2005    by Dr. Dalbert Batman  . Total abdominal hysterectomy w/ bilateral salpingoophorectomy    . Colonoscopy  02/05/2007,02/05/05    Dr. Silvano Rusk  . Esophagogastroduodenoscopy  02/05/2005,01/22/90    Dr. Silvano Rusk, Dr. Rachelle Hora  . Pacemaker implanted  05/11/97,08/02/05    [facility]  . Appendectomy  1971  . Tonsillectomy    . Cardiac catheterization  05/10/1997    Normal coronaries, MVP  . Nm myocar perf wall motion  08/24/2010    Normal  . Pacemaker generator change N/A 02/25/2012    Procedure: PACEMAKER GENERATOR CHANGE;  Surgeon: Sanda Klein, MD;  Location: [facility];  Service: Cardiovascular;  Laterality: N/A;     Current Outpatient Prescriptions  Medication Sig Dispense Refill  . amiodarone (PACERONE) 200 MG tablet Take 1 tablet (200 mg total) by mouth daily. 90 tablet 3  . atenolol (TENORMIN) 50 MG tablet TAKE ONE AND ONE-HALF TABLETS DAILY (Patient taking differently: TAKE ONE AND ONE-HALF TABLETS BY MOUTH DAILY) 135 tablet 2  . cholecalciferol (VITAMIN D) 1000 UNITS tablet Take 1,000 Units by mouth daily.    . furosemide (LASIX) 20 MG tablet Take 1 tablet (20 mg total) by mouth daily. 90 tablet 1  . gabapentin (NEURONTIN) 300 MG capsule TAKE 1 CAPSULE TWICE A DAY (Patient taking differently: TAKE 1 CAPSULE BY MOUTH TWICE A DAY) 180 capsule 3  . levothyroxine (SYNTHROID, LEVOTHROID) 125 MCG tablet TAKE 1 TABLET DAILY BEFORE BREAKFAST 90 tablet 1  . Multiple Vitamin (MULTIVITAMIN WITH MINERALS) TABS tablet Take 1 tablet by mouth daily.    Marland Kitchen NEXIUM 40 MG capsule TAKE 1 CAPSULE DAILY BEFORE BREAKFAST 90 capsule 1  . polyvinyl alcohol (ARTIFICIAL TEARS) 1.4 % ophthalmic solution Place 1 drop into both eyes 3 (three) times daily as needed for dry eyes.    . pravastatin (PRAVACHOL) 40 MG tablet TAKE 1 TABLET DAILY 90 tablet 1  . vitamin B-12 (CYANOCOBALAMIN) 500 MCG tablet Take 500 mcg by mouth daily.    Marland Kitchen warfarin (COUMADIN) 2 MG tablet  TAKE 1 TABLET DAILY BY MOUTH ON MONDAYS AND 1/2 TABLET ON ALL OTHER DAYS OF THE WEEK OR AS DIRECTED 1 tablet 0   No current facility-administered medications for this visit.    Allergies:   Cymbalta; Iodinated diagnostic agents; and Other    Social History:  The patient  reports that she quit smoking about 63 years ago. Her smoking use included Cigarettes. She has a 12 pack-year smoking history. She has never used smokeless tobacco. She reports that she does not drink alcohol or use illicit drugs.   Family History:  The patient's family history includes Alcohol abuse in her daughter; Cancer (age of onset: 70) in her sister; Colon cancer in an other family member; Diabetes in her brother, brother, daughter, father, sister, sister, and son; Diabetes (age of onset: 45) in  her mother; Gallbladder disease in her father; Heart attack in her brother; Hypertension in her daughter and sister; Pancreatic cancer in her mother; Stomach cancer in her maternal aunt.    ROS:  Please see the history of present illness.    Otherwise, review of systems positive for none.   All other systems are reviewed and negative.    PHYSICAL EXAM: VS:  BP 132/70 mmHg  Pulse 70  Ht 5\' 7"  (1.702 m)  Wt 194 lb (87.998 kg)  BMI 30.38 kg/m2 , BMI Body mass index is 30.38 kg/(m^2).  General: Alert, oriented x3, no distress Head: no evidence of trauma, PERRL, EOMI, no exophtalmos or lid lag, no myxedema, no xanthelasma; normal ears, nose and oropharynx Neck: normal jugular venous pulsations and no hepatojugular reflux; brisk carotid pulses without delay and no carotid bruits Chest:  Diminished breath sounds and dry rales versus pleural rubs in the right base ; symmetrical and full respiratory excursions; Cardiovascular: normal position and quality of the apical impulse, regular rhythm, normal first and paradoxically split second heart sounds, no murmurs, rubs or gallops Abdomen: no tenderness or distention, no masses by  palpation, no abnormal pulsatility or arterial bruits, normal bowel sounds, no hepatosplenomegaly Extremities: no clubbing, cyanosis;  Prominent hyperpigmentation consistent with chronic venous stasis dermatitis, symmetrical 1+ pretibial and ankle edema; 2+ radial, ulnar and brachial pulses bilaterally; 2+ right femoral, posterior tibial and dorsalis pedis pulses; 2+ left femoral, posterior tibial and dorsalis pedis pulses; no subclavian or femoral bruits Neurological: grossly nonfocal Psych: euthymic mood, full affect   EKG:  EKG is ordered today. The ekg ordered today demonstrates  AV sequential pacing  Recent Labs: 04/25/2015: TSH 4.70* 08/01/2015: B Natriuretic Peptide 585.2* 08/04/2015: ALT 27 08/07/2015: Magnesium 2.0 08/09/2015: BUN 27*; Creatinine, Ser 1.51*; Hemoglobin 10.2*; Platelets 182; Potassium 4.4; Sodium 141    Lipid Panel    Component Value Date/Time   CHOL 158 04/28/2015 1202   TRIG 226.0* 04/28/2015 1202   HDL 43.30 04/28/2015 1202   CHOLHDL 4 04/28/2015 1202   VLDL 45.2* 04/28/2015 1202   LDLCALC 70 11/10/2014 0949   LDLDIRECT 77.0 04/28/2015 1202      Wt Readings from Last 3 Encounters:  09/04/15 194 lb (87.998 kg)  09/01/15 194 lb 4 oz (88.111 kg)  08/06/15 195 lb 12.3 oz (88.8 kg)    Other studies Reviewed: Additional studies/ records that were reviewed today include:  Imaging studies, echocardiogram, notes from recent hospitalization.   ASSESSMENT AND PLAN:  1. Complete heart block, pacemaker dependent - normal device function, remote [place] transmission every 3 months and office visit in 6 months. 2. History of recurrent paroxysmal atrial fibrillation,  Now with periods of sustained irregular atrial tachycardia (possibly atypical atrial flutter) on appropriate anticoagulation with warfarin, uncomplicated by bleeding or embolic events.  Recent frequent episodes of paroxysmal atrial tachycardia may have been related to the respiratory illness 3.  Chronic amiodarone therapy -  Recent liver function tests are normal.  Minimal elevation in TSH at 4.7 with normal free T4 in July. Would not recheck this until she has fully recovered from her respiratory illness. Also reminded her about the importance of avoiding sun exposure while on amiodarone, yearly ophthalmological exam and periodic assessment of liver and thyroid function tests. 4. Hypercholesterolemia with excellent recent LDL on pravastatin note; history of normal coronary arteries by angiography in 1998, normal nuclear stress test 2014 5. Recent pneumonia complicated by acute respiratory failure , improving. Note history of previous empyema  Current medicines are reviewed at length with the patient today.  The patient does not have concerns regarding medicines.  The following changes have been made:   Warfarin dose adjusted per INR see Coumadin clinic notes  Labs/ tests ordered today include:  No orders of the defined types were placed in this encounter.     Patient Instructions  Remote monitoring is used to monitor your Pacemaker from home. This monitoring reduces the number of office visits required to check your device to one time per year. It allows [place] to monitor the functioning of your device to ensure it is working properly. You are scheduled for a device check from home on December 06, 2015. You may send your transmission at any time that day. If you have a wireless device, the transmission will be sent automatically. After your physician reviews your transmission, you will receive a postcard with your next transmission date.  Dr. Sallyanne Kuster recommends that you schedule a follow-up appointment in: 6 months        Signed, Sanda Klein, MD  09/04/2015 9:53 AM    Sanda Klein, MD, [facility] [phone] office [phone] pager

## 2015-09-04 NOTE — Patient Instructions (Signed)
Remote monitoring is used to monitor your Pacemaker from home. This monitoring reduces the number of office visits required to check your device to one time per year. It allows [place] to monitor the functioning of your device to ensure it is working properly. You are scheduled for a device check from home on December 06, 2015. You may send your transmission at any time that day. If you have a wireless device, the transmission will be sent automatically. After your physician reviews your transmission, you will receive a postcard with your next transmission date.  Dr. Sallyanne Kuster recommends that you schedule a follow-up appointment in: 6 months

## 2015-09-05 ENCOUNTER — Ambulatory Visit: Payer: Medicare Other | Admitting: Physician Assistant

## 2015-09-05 NOTE — Addendum Note (Signed)
Addended by: Janett Labella A on: 09/05/2015 07:40 AM   Modules accepted: Orders

## 2015-09-06 DIAGNOSIS — I4891 Unspecified atrial fibrillation: Secondary | ICD-10-CM | POA: Diagnosis not present

## 2015-09-06 DIAGNOSIS — I5033 Acute on chronic diastolic (congestive) heart failure: Secondary | ICD-10-CM | POA: Diagnosis not present

## 2015-09-10 ENCOUNTER — Encounter: Payer: Self-pay | Admitting: Family Medicine

## 2015-09-10 NOTE — Assessment & Plan Note (Signed)
Stable, mild, no changes

## 2015-09-10 NOTE — Assessment & Plan Note (Addendum)
Feeling much better, following with pulmonology

## 2015-09-10 NOTE — Assessment & Plan Note (Signed)
With hip on right as well. Encouraged moist heat and gentle stretching as tolerated. May try NSAIDs and prescription meds as directed and report if symptoms worsen or seek immediate care

## 2015-09-10 NOTE — Assessment & Plan Note (Signed)
Continue to supplement.

## 2015-09-10 NOTE — Assessment & Plan Note (Signed)
Tolerating statin, encouraged heart healthy diet, avoid trans fats, minimize simple carbs and saturated fats. Increase exercise as tolerated 

## 2015-09-10 NOTE — Progress Notes (Signed)
Subjective:    Patient ID: Jill Washington, female    DOB: Jan 26, 1926, 81 y.o.   MRN: [id number]  Chief Complaint  Patient presents with  . Follow-up    HPI Patient is in today for follow up. Has recovered well from her recent CAP, only minimal head congestion persists. No fevers, chills, malaise. Has been noting some increased low back and hip pain on right as she has tried to become more active. Denies CP/palp/SOB/HA/fevers/GI or GU c/o. Taking meds as prescribed  Past Medical History  Diagnosis Date  . History of empyema of pleura   . Hypertension   . Mitral valve prolapse   . History of atrial fibrillation   . Sick sinus syndrome ([place])   . Venous insufficiency   . Hypercholesteremia   . Hypothyroidism   . Hiatal hernia   . IBS (irritable bowel syndrome)   . Colon polyps     hyperplastic  . Fibromyalgia   . Vitamin D deficiency   . Peripheral neuropathy ([place])   . Critical illness polyneuropathy ([place])   . History of skin cancer     face  . Diverticulosis of sigmoid colon 02/05/2007    mild  . GERD (gastroesophageal reflux disease)   . Pacemaker   . Arthritis   . History of gallstones   . Fecal incontinence   . Urinary incontinence   . Blood transfusion   . Chicken pox as a child  . Measles as a child  . Mumps as a child  . Shingles 8 yrs ago  . Overactive bladder 05/07/2011  . CHB (complete heart block) ([place]) 11/10/2014  . Medicare annual wellness visit, subsequent 05/15/2015    Allergies verified: UTD   Immunization Status: Flu vaccine-- NA  Tdap-- 9-10 years ago per patient  PNA-- 09/30/07 (23)  Shingles-- 04/07/14   A/P:  Changes to [place], [place] or Personal Hx: UTD Pap-- none recent  MMG--declines further MMgs (last 08/12/03 BI-RADS 1: Neg)  Bone Density-- 08/11/03 with Prescott Gum, MD- Normal, declines further testing CCS-- 05/07/12 with Silvano Rusk, MD- polyp removed- no   . History of chicken pox   . H/O measles   . H/O mumps     Past Surgical History    Procedure Laterality Date  . Cervical spine surgery    . Cholecystectomy  03/2005    by Dr. Dalbert Batman  . Total abdominal hysterectomy w/ bilateral salpingoophorectomy    . Colonoscopy  02/05/2007,02/05/05    Dr. Silvano Rusk  . Esophagogastroduodenoscopy  02/05/2005,01/22/90    Dr. Silvano Rusk, Dr. Rachelle Hora  . Pacemaker implanted  05/11/97,08/02/05    [facility]  . Appendectomy  1971  . Tonsillectomy    . Cardiac catheterization  05/10/1997    Normal coronaries, MVP  . Nm myocar perf wall motion  08/24/2010    Normal  . Pacemaker generator change N/A 02/25/2012    Procedure: PACEMAKER GENERATOR CHANGE;  Surgeon: Sanda Klein, MD;  Location: [facility];  Service: Cardiovascular;  Laterality: N/A;    Family History  Problem Relation Age of Onset  . Pancreatic cancer Mother   . Diabetes Mother 73    type 2  . Stomach cancer Maternal Aunt   . Heart attack Brother   . Diabetes Brother     type 2  . Gallbladder disease Father   . Diabetes Father     type 2  . Diabetes Brother     X 2   . Cancer Sister 68  skin ca on face  . Diabetes Sister     type 2  . Colon cancer    . Diabetes Daughter     type 2  . Hypertension Daughter   . Alcohol abuse Daughter   . Diabetes Son     type 2  . Diabetes Sister     type 1  . Hypertension Sister     Social History   Social History  . Marital Status: Widowed    Spouse Name: N/A  . Number of Children: 3  . Years of Education: N/A   Occupational History  . retired    Social History Main Topics  . Smoking status: Former Smoker -- 2.00 packs/day for 6 years    Types: Cigarettes    Quit date: 10/15/1951  . Smokeless tobacco: Never Used  . Alcohol Use: No  . Drug Use: No  . Sexual Activity: Not on file   Other Topics Concern  . Not on file   Social History Narrative   She is a widow, one son to daughters. She is retired.    Outpatient Prescriptions Prior to Visit  Medication Sig Dispense Refill  . amiodarone  (PACERONE) 200 MG tablet Take 1 tablet (200 mg total) by mouth daily. 90 tablet 3  . atenolol (TENORMIN) 50 MG tablet TAKE ONE AND ONE-HALF TABLETS DAILY (Patient taking differently: TAKE ONE AND ONE-HALF TABLETS BY MOUTH DAILY) 135 tablet 2  . cholecalciferol (VITAMIN D) 1000 UNITS tablet Take 1,000 Units by mouth daily.    Marland Kitchen gabapentin (NEURONTIN) 300 MG capsule TAKE 1 CAPSULE TWICE A DAY (Patient taking differently: TAKE 1 CAPSULE BY MOUTH TWICE A DAY) 180 capsule 3  . levothyroxine (SYNTHROID, LEVOTHROID) 125 MCG tablet TAKE 1 TABLET DAILY BEFORE BREAKFAST 90 tablet 1  . Multiple Vitamin (MULTIVITAMIN WITH MINERALS) TABS tablet Take 1 tablet by mouth daily.    Marland Kitchen NEXIUM 40 MG capsule TAKE 1 CAPSULE DAILY BEFORE BREAKFAST 90 capsule 1  . polyvinyl alcohol (ARTIFICIAL TEARS) 1.4 % ophthalmic solution Place 1 drop into both eyes 3 (three) times daily as needed for dry eyes.    . pravastatin (PRAVACHOL) 40 MG tablet TAKE 1 TABLET DAILY 90 tablet 1  . vitamin B-12 (CYANOCOBALAMIN) 500 MCG tablet Take 500 mcg by mouth daily.    Marland Kitchen warfarin (COUMADIN) 2 MG tablet TAKE 1 TABLET DAILY BY MOUTH ON MONDAYS AND 1/2 TABLET ON ALL OTHER DAYS OF THE WEEK OR AS DIRECTED 1 tablet 0  . furosemide (LASIX) 20 MG tablet Take 1 tablet (20 mg total) by mouth daily. 30 tablet   . phytonadione (VITAMIN K) 5 MG tablet Take 0.5 tablets (2.5 mg total) by mouth once. 1 tablet 0  . pravastatin (PRAVACHOL) 40 MG tablet Take 1 tablet (40 mg total) by mouth daily. (Patient taking differently: Take 40 mg by mouth at bedtime. ) 10 tablet 0   No facility-administered medications prior to visit.    Allergies  Allergen Reactions  . Cymbalta [Duloxetine Hcl] Other (See Comments)    Could not think straight  . Iodinated Diagnostic Agents Other (See Comments)    Pt was not told what the reaction was  . Other Itching    Dial Soap    Review of Systems  Constitutional: Positive for malaise/fatigue. Negative for fever.  HENT:  Positive for congestion.   Eyes: Negative for discharge.  Respiratory: Negative for shortness of breath.   Cardiovascular: Negative for chest pain, palpitations and leg swelling.  Gastrointestinal: Negative  for nausea and abdominal pain.  Genitourinary: Negative for dysuria.  Musculoskeletal: Negative for falls.  Skin: Negative for rash.  Neurological: Negative for loss of consciousness and headaches.  Endo/Heme/Allergies: Negative for environmental allergies.  Psychiatric/Behavioral: Negative for depression. The patient is not nervous/anxious.        Objective:    Physical Exam  Constitutional: She is oriented to person, place, and time. She appears well-developed and well-nourished. No distress.  HENT:  Head: Normocephalic and atraumatic.  Nose: Nose normal.  Eyes: Right eye exhibits no discharge. Left eye exhibits no discharge.  Neck: Normal range of motion. Neck supple.  Cardiovascular: Normal rate and regular rhythm.   No murmur heard. Pulmonary/Chest: Effort normal and breath sounds normal.  Abdominal: Soft. Bowel sounds are normal. There is no tenderness.  Musculoskeletal: She exhibits no edema.  Neurological: She is alert and oriented to person, place, and time.  Skin: Skin is warm and dry.  Psychiatric: She has a normal mood and affect.  Nursing note and vitals reviewed.   BP 140/82 mmHg  Pulse 91  Temp(Src) 97.8 F (36.6 C) (Oral)  Ht 5\' 7"  (1.702 m)  Wt 194 lb 4 oz (88.111 kg)  BMI 30.42 kg/m2  SpO2 94% Wt Readings from Last 3 Encounters:  09/04/15 194 lb (87.998 kg)  09/01/15 194 lb 4 oz (88.111 kg)  08/06/15 195 lb 12.3 oz (88.8 kg)     Lab Results  Component Value Date   WBC 7.7 08/09/2015   HGB 10.2* 08/09/2015   HCT 32.2* 08/09/2015   PLT 182 08/09/2015   GLUCOSE 102* 08/09/2015   CHOL 158 04/28/2015   TRIG 226.0* 04/28/2015   HDL 43.30 04/28/2015   LDLDIRECT 77.0 04/28/2015   LDLCALC 70 11/10/2014   ALT 27 08/04/2015   AST 32 08/04/2015     NA 141 08/09/2015   K 4.4 08/09/2015   CL 98* 08/09/2015   CREATININE 1.51* 08/09/2015   BUN 27* 08/09/2015   CO2 29 08/09/2015   TSH 4.70* 04/25/2015   INR 4 09/04/2015   HGBA1C 5.3 08/02/2015    Lab Results  Component Value Date   TSH 4.70* 04/25/2015   Lab Results  Component Value Date   WBC 7.7 08/09/2015   HGB 10.2* 08/09/2015   HCT 32.2* 08/09/2015   MCV 96.4 08/09/2015   PLT 182 08/09/2015   Lab Results  Component Value Date   NA 141 08/09/2015   K 4.4 08/09/2015   CO2 29 08/09/2015   GLUCOSE 102* 08/09/2015   BUN 27* 08/09/2015   CREATININE 1.51* 08/09/2015   BILITOT 0.4 08/04/2015   ALKPHOS 80 08/04/2015   AST 32 08/04/2015   ALT 27 08/04/2015   PROT 6.2* 08/04/2015   ALBUMIN 2.9* 08/04/2015   CALCIUM 9.3 08/09/2015   ANIONGAP 14 08/09/2015   GFR 32.19* 05/02/2015   Lab Results  Component Value Date   CHOL 158 04/28/2015   Lab Results  Component Value Date   HDL 43.30 04/28/2015   Lab Results  Component Value Date   LDLCALC 70 11/10/2014   Lab Results  Component Value Date   TRIG 226.0* 04/28/2015   Lab Results  Component Value Date   CHOLHDL 4 04/28/2015   Lab Results  Component Value Date   HGBA1C 5.3 08/02/2015       Assessment & Plan:   Problem List Items Addressed This Visit    Anemia    Stable, mild, no changes      Atrial fibrillation (  [place])   Relevant Medications   furosemide (LASIX) 20 MG tablet   Other Relevant Orders   POCT INR (Completed)   RESOLVED: CAP (community acquired pneumonia) - Primary   Relevant Orders   DG Chest 2 View (Completed)   HYPERCHOLESTEROLEMIA    Tolerating statin, encouraged heart healthy diet, avoid trans fats, minimize simple carbs and saturated fats. Increase exercise as tolerated      Relevant Medications   furosemide (LASIX) 20 MG tablet   Low back pain    With hip on right as well. Encouraged moist heat and gentle stretching as tolerated. May try NSAIDs and prescription meds as  directed and report if symptoms worsen or seek immediate care      Rhinovirus infection    Feeling much better, following with pulmonology      Vitamin D deficiency    Continue to supplement         I have discontinued Ms. Vetere's phytonadione. I am also having her maintain her vitamin B-12, amiodarone, gabapentin, atenolol, multivitamin with minerals, cholecalciferol, polyvinyl alcohol, levothyroxine, warfarin, pravastatin, NEXIUM, and furosemide.  Meds ordered this encounter  Medications  . furosemide (LASIX) 20 MG tablet    Sig: Take 1 tablet (20 mg total) by mouth daily.    Dispense:  90 tablet    Refill:  1     [name], MD

## 2015-09-11 LAB — CUP PACEART INCLINIC DEVICE CHECK
Battery Voltage: 2.95 V
Brady Statistic RA Percent Paced: 82 %
Brady Statistic RV Percent Paced: 99 %
Date Time Interrogation Session: 20161128142328
Implantable Lead Implant Date: 19980729
Implantable Lead Location: 753859
Lead Channel Impedance Value: 360 Ohm
Lead Channel Pacing Threshold Amplitude: 0.375 V
Lead Channel Pacing Threshold Pulse Width: 0.4 ms
Lead Channel Pacing Threshold Pulse Width: 0.4 ms
Lead Channel Sensing Intrinsic Amplitude: 3.2 mV
Lead Channel Setting Pacing Amplitude: 1.25 V
MDC IDC LEAD IMPLANT DT: 19980729
MDC IDC LEAD LOCATION: 753860
MDC IDC MSMT BATTERY REMAINING LONGEVITY: 96 mo
MDC IDC MSMT BATTERY REMAINING PERCENTAGE: 91 %
MDC IDC MSMT LEADCHNL RV IMPEDANCE VALUE: 440 Ohm
MDC IDC MSMT LEADCHNL RV PACING THRESHOLD AMPLITUDE: 1.25 V
MDC IDC SET LEADCHNL RA PACING AMPLITUDE: 1.5 V
MDC IDC SET LEADCHNL RV PACING PULSEWIDTH: 0.4 ms
MDC IDC SET LEADCHNL RV SENSING SENSITIVITY: 4 mV
Pulse Gen Model: 2210
Pulse Gen Serial Number: 7338831

## 2015-09-12 ENCOUNTER — Telehealth: Payer: Self-pay | Admitting: *Deleted

## 2015-09-12 DIAGNOSIS — I4891 Unspecified atrial fibrillation: Secondary | ICD-10-CM | POA: Diagnosis not present

## 2015-09-12 DIAGNOSIS — I5033 Acute on chronic diastolic (congestive) heart failure: Secondary | ICD-10-CM | POA: Diagnosis not present

## 2015-09-12 NOTE — Telephone Encounter (Signed)
Forwarded to Dr. Blyth. JG//CMA  

## 2015-09-13 DIAGNOSIS — I4891 Unspecified atrial fibrillation: Secondary | ICD-10-CM | POA: Diagnosis not present

## 2015-09-13 DIAGNOSIS — I5033 Acute on chronic diastolic (congestive) heart failure: Secondary | ICD-10-CM | POA: Diagnosis not present

## 2015-09-14 NOTE — Telephone Encounter (Signed)
Signed forms faxed to [facility] successfully. Sent for scanning. JG//CMA

## 2015-09-15 ENCOUNTER — Ambulatory Visit (INDEPENDENT_AMBULATORY_CARE_PROVIDER_SITE_OTHER): Payer: Medicare Other | Admitting: Pharmacist Clinician (PhC)/ Clinical Pharmacy Specialist

## 2015-09-15 ENCOUNTER — Encounter: Payer: Self-pay | Admitting: Cardiovascular Disease

## 2015-09-15 DIAGNOSIS — I4891 Unspecified atrial fibrillation: Secondary | ICD-10-CM | POA: Diagnosis not present

## 2015-09-15 DIAGNOSIS — Z7901 Long term (current) use of anticoagulants: Secondary | ICD-10-CM | POA: Diagnosis not present

## 2015-09-15 LAB — POCT INR: INR: 2.7

## 2015-09-21 DIAGNOSIS — N3946 Mixed incontinence: Secondary | ICD-10-CM | POA: Diagnosis not present

## 2015-09-21 DIAGNOSIS — N3020 Other chronic cystitis without hematuria: Secondary | ICD-10-CM | POA: Diagnosis not present

## 2015-10-06 ENCOUNTER — Ambulatory Visit (INDEPENDENT_AMBULATORY_CARE_PROVIDER_SITE_OTHER): Payer: Medicare Other | Admitting: Pharmacist Clinician (PhC)/ Clinical Pharmacy Specialist

## 2015-10-06 DIAGNOSIS — Z7901 Long term (current) use of anticoagulants: Secondary | ICD-10-CM

## 2015-10-06 DIAGNOSIS — I4891 Unspecified atrial fibrillation: Secondary | ICD-10-CM | POA: Diagnosis not present

## 2015-10-06 LAB — POCT INR: INR: 1.7

## 2015-10-24 ENCOUNTER — Other Ambulatory Visit: Payer: Self-pay | Admitting: Cardiovascular Disease

## 2015-10-24 ENCOUNTER — Other Ambulatory Visit: Payer: Self-pay

## 2015-10-24 MED ORDER — AMIODARONE HCL 200 MG PO TABS: 200.0000 mg | ORAL_TABLET | Freq: Every day | ORAL | Status: DC

## 2015-10-27 ENCOUNTER — Ambulatory Visit (INDEPENDENT_AMBULATORY_CARE_PROVIDER_SITE_OTHER): Payer: Medicare Other | Admitting: Pharmacist Clinician (PhC)/ Clinical Pharmacy Specialist

## 2015-10-27 DIAGNOSIS — I4891 Unspecified atrial fibrillation: Secondary | ICD-10-CM

## 2015-10-27 DIAGNOSIS — Z7901 Long term (current) use of anticoagulants: Secondary | ICD-10-CM | POA: Diagnosis not present

## 2015-10-27 LAB — POCT INR: INR: 1.7

## 2015-10-30 ENCOUNTER — Encounter: Payer: Self-pay | Admitting: Family Medicine

## 2015-10-30 ENCOUNTER — Ambulatory Visit (INDEPENDENT_AMBULATORY_CARE_PROVIDER_SITE_OTHER): Payer: Medicare Other | Admitting: Family Medicine

## 2015-10-30 VITALS — BP 140/72 | HR 81 | Temp 98.2°F | Ht 67.0 in | Wt 196.5 lb

## 2015-10-30 DIAGNOSIS — I10 Essential (primary) hypertension: Secondary | ICD-10-CM

## 2015-10-30 DIAGNOSIS — E039 Hypothyroidism, unspecified: Secondary | ICD-10-CM | POA: Diagnosis not present

## 2015-10-30 DIAGNOSIS — I480 Paroxysmal atrial fibrillation: Secondary | ICD-10-CM

## 2015-10-30 DIAGNOSIS — Z23 Encounter for immunization: Secondary | ICD-10-CM | POA: Diagnosis not present

## 2015-10-30 DIAGNOSIS — E559 Vitamin D deficiency, unspecified: Secondary | ICD-10-CM

## 2015-10-30 DIAGNOSIS — E7800 Pure hypercholesterolemia, unspecified: Secondary | ICD-10-CM | POA: Diagnosis not present

## 2015-10-30 MED ORDER — TRAMADOL HCL 50 MG PO TABS: 50.0000 mg | ORAL_TABLET | Freq: Every day | ORAL | Status: DC | PRN

## 2015-10-30 MED ORDER — FUROSEMIDE 20 MG PO TABS: 20.0000 mg | ORAL_TABLET | Freq: Every day | ORAL | Status: DC

## 2015-10-30 NOTE — Progress Notes (Signed)
Subjective:    Patient ID: IOMA MENCER, female    DOB: April 29, 1926, 82 y.o.   MRN: [id number]  Chief Complaint  Patient presents with  . Follow-up    HPI Patient is in today for blood pressure follow-up. She realizes after her last visit here she had accidentally put her lisinopril aside and was not taking it. As a result she started it but has not gone to twice a day as instructed. She feels well. She's had some minor head congestion but offers no other acute complaints. She denies any malaise or myalgias. Denies CP/palp/SOB/HA/fevers/GI or GU c/o. Taking meds as prescribed  Past Medical History  Diagnosis Date  . History of empyema of pleura   . Hypertension   . Mitral valve prolapse   . History of atrial fibrillation   . Sick sinus syndrome ([place])   . Venous insufficiency   . Hypercholesteremia   . Hypothyroidism   . Hiatal hernia   . IBS (irritable bowel syndrome)   . Colon polyps     hyperplastic  . Fibromyalgia   . Vitamin D deficiency   . Peripheral neuropathy ([place])   . Critical illness polyneuropathy ([place])   . History of skin cancer     face  . Diverticulosis of sigmoid colon 02/05/2007    mild  . GERD (gastroesophageal reflux disease)   . Pacemaker   . Arthritis   . History of gallstones   . Fecal incontinence   . Urinary incontinence   . Blood transfusion   . Chicken pox as a child  . Measles as a child  . Mumps as a child  . Shingles 8 yrs ago  . Overactive bladder 05/07/2011  . CHB (complete heart block) ([place]) 11/10/2014  . Medicare annual wellness visit, subsequent 05/15/2015    Allergies verified: UTD   Immunization Status: Flu vaccine-- NA  Tdap-- 9-10 years ago per patient  PNA-- 09/30/07 (23)  Shingles-- 04/07/14   A/P:  Changes to [place], [place] or Personal Hx: UTD Pap-- none recent  MMG--declines further MMgs (last 08/12/03 BI-RADS 1: Neg)  Bone Density-- 08/11/03 with Prescott Gum, MD- Normal, declines further testing CCS-- 05/07/12 with Silvano Rusk, MD-  polyp removed- no   . History of chicken pox   . H/O measles   . H/O mumps     Past Surgical History  Procedure Laterality Date  . Cervical spine surgery    . Cholecystectomy  03/2005    by Dr. Dalbert Batman  . Total abdominal hysterectomy w/ bilateral salpingoophorectomy    . Colonoscopy  02/05/2007,02/05/05    Dr. Silvano Rusk  . Esophagogastroduodenoscopy  02/05/2005,01/22/90    Dr. Silvano Rusk, Dr. Rachelle Hora  . Pacemaker implanted  05/11/97,08/02/05    [facility]  . Appendectomy  1971  . Tonsillectomy    . Cardiac catheterization  05/10/1997    Normal coronaries, MVP  . Nm myocar perf wall motion  08/24/2010    Normal  . Pacemaker generator change N/A 02/25/2012    Procedure: PACEMAKER GENERATOR CHANGE;  Surgeon: Sanda Klein, MD;  Location: [facility];  Service: Cardiovascular;  Laterality: N/A;    Family History  Problem Relation Age of Onset  . Pancreatic cancer Mother   . Diabetes Mother 44    type 2  . Stomach cancer Maternal Aunt   . Heart attack Brother   . Diabetes Brother     type 2  . Gallbladder disease Father   . Diabetes Father  type 2  . Diabetes Brother     X 2   . Cancer Sister 80    skin ca on face  . Diabetes Sister     type 2  . Colon cancer    . Diabetes Daughter     type 2  . Hypertension Daughter   . Alcohol abuse Daughter   . Diabetes Son     type 2  . Diabetes Sister     type 1  . Hypertension Sister     Social History   Social History  . Marital Status: Widowed    Spouse Name: N/A  . Number of Children: 3  . Years of Education: N/A   Occupational History  . retired    Social History Main Topics  . Smoking status: Former Smoker -- 2.00 packs/day for 6 years    Types: Cigarettes    Quit date: 10/15/1951  . Smokeless tobacco: Never Used  . Alcohol Use: No  . Drug Use: No  . Sexual Activity: Not on file   Other Topics Concern  . Not on file   Social History Narrative   She is a widow, one son to daughters. She is  retired.    Outpatient Prescriptions Prior to Visit  Medication Sig Dispense Refill  . amiodarone (PACERONE) 200 MG tablet Take 1 tablet (200 mg total) by mouth daily. 90 tablet 3  . atenolol (TENORMIN) 50 MG tablet TAKE ONE AND ONE-HALF TABLETS DAILY (Patient taking differently: TAKE ONE AND ONE-HALF TABLETS BY MOUTH DAILY) 135 tablet 2  . cholecalciferol (VITAMIN D) 1000 UNITS tablet Take 1,000 Units by mouth daily.    Marland Kitchen gabapentin (NEURONTIN) 300 MG capsule TAKE 1 CAPSULE TWICE A DAY (Patient taking differently: TAKE 1 CAPSULE BY MOUTH TWICE A DAY) 180 capsule 3  . Multiple Vitamin (MULTIVITAMIN WITH MINERALS) TABS tablet Take 1 tablet by mouth daily.    Marland Kitchen NEXIUM 40 MG capsule TAKE 1 CAPSULE DAILY BEFORE BREAKFAST 90 capsule 1  . polyvinyl alcohol (ARTIFICIAL TEARS) 1.4 % ophthalmic solution Place 1 drop into both eyes 3 (three) times daily as needed for dry eyes.    . pravastatin (PRAVACHOL) 40 MG tablet TAKE 1 TABLET DAILY 90 tablet 1  . trimethoprim (TRIMPEX) 100 MG tablet Take 100 mg by mouth daily.    . vitamin B-12 (CYANOCOBALAMIN) 500 MCG tablet Take 500 mcg by mouth daily.    Marland Kitchen warfarin (COUMADIN) 2 MG tablet TAKE 1 TABLET DAILY BY MOUTH ON MONDAYS AND 1/2 TABLET ON ALL OTHER DAYS OF THE WEEK OR AS DIRECTED 1 tablet 0  . furosemide (LASIX) 20 MG tablet Take 1 tablet (20 mg total) by mouth daily. 90 tablet 1  . levothyroxine (SYNTHROID, LEVOTHROID) 125 MCG tablet TAKE 1 TABLET DAILY BEFORE BREAKFAST 90 tablet 1   No facility-administered medications prior to visit.    Allergies  Allergen Reactions  . Cymbalta [Duloxetine Hcl] Other (See Comments)    Could not think straight  . Iodinated Diagnostic Agents Other (See Comments)    Pt was not told what the reaction was  . Other Itching    Dial Soap    Review of Systems  Constitutional: Negative for fever and malaise/fatigue.  HENT: Positive for congestion.   Eyes: Negative for discharge.  Respiratory: Negative for shortness  of breath.   Cardiovascular: Negative for chest pain, palpitations and leg swelling.  Gastrointestinal: Negative for nausea and abdominal pain.  Genitourinary: Negative for dysuria.  Musculoskeletal: Negative for falls.  Skin: Negative for rash.  Neurological: Negative for loss of consciousness and headaches.  Endo/Heme/Allergies: Negative for environmental allergies.  Psychiatric/Behavioral: Negative for depression. The patient is not nervous/anxious.        Objective:    Physical Exam  Constitutional: She is oriented to person, place, and time. She appears well-developed and well-nourished. No distress.  HENT:  Head: Normocephalic and atraumatic.  Nose: Nose normal.  Eyes: Right eye exhibits no discharge. Left eye exhibits no discharge.  Neck: Normal range of motion. Neck supple.  Cardiovascular: Normal rate and regular rhythm.   No murmur heard. Pulmonary/Chest: Effort normal and breath sounds normal.  Abdominal: Soft. Bowel sounds are normal. There is no tenderness.  Musculoskeletal: She exhibits no edema.  Neurological: She is alert and oriented to person, place, and time.  Skin: Skin is warm and dry.  Psychiatric: She has a normal mood and affect.  Nursing note and vitals reviewed.   BP 140/72 mmHg  Pulse 81  Temp(Src) 98.2 F (36.8 C) (Oral)  Ht 5\' 7"  (1.702 m)  Wt 196 lb 8 oz (89.132 kg)  BMI 30.77 kg/m2  SpO2 94% Wt Readings from Last 3 Encounters:  10/30/15 196 lb 8 oz (89.132 kg)  09/04/15 194 lb (87.998 kg)  09/01/15 194 lb 4 oz (88.111 kg)     Lab Results  Component Value Date   WBC 6.4 10/30/2015   HGB 12.0 10/30/2015   HCT 37.0 10/30/2015   PLT 194.0 10/30/2015   GLUCOSE 76 10/30/2015   CHOL 146 10/30/2015   TRIG 137.0 10/30/2015   HDL 50.30 10/30/2015   LDLDIRECT 77.0 04/28/2015   LDLCALC 68 10/30/2015   ALT 19 10/30/2015   AST 27 10/30/2015   NA 140 10/30/2015   K 4.8 10/30/2015   CL 108 10/30/2015   CREATININE 1.97* 10/30/2015   BUN  26* 10/30/2015   CO2 25 10/30/2015   TSH 5.35* 10/30/2015   INR 1.7 10/27/2015   HGBA1C 5.3 08/02/2015    Lab Results  Component Value Date   TSH 5.35* 10/30/2015   Lab Results  Component Value Date   WBC 6.4 10/30/2015   HGB 12.0 10/30/2015   HCT 37.0 10/30/2015   MCV 91.1 10/30/2015   PLT 194.0 10/30/2015   Lab Results  Component Value Date   NA 140 10/30/2015   K 4.8 10/30/2015   CO2 25 10/30/2015   GLUCOSE 76 10/30/2015   BUN 26* 10/30/2015   CREATININE 1.97* 10/30/2015   BILITOT 0.3 10/30/2015   ALKPHOS 55 10/30/2015   AST 27 10/30/2015   ALT 19 10/30/2015   PROT 7.0 10/30/2015   ALBUMIN 3.8 10/30/2015   CALCIUM 9.2 10/30/2015   ANIONGAP 14 08/09/2015   GFR 25.47* 10/30/2015   Lab Results  Component Value Date   CHOL 146 10/30/2015   Lab Results  Component Value Date   HDL 50.30 10/30/2015   Lab Results  Component Value Date   LDLCALC 68 10/30/2015   Lab Results  Component Value Date   TRIG 137.0 10/30/2015   Lab Results  Component Value Date   CHOLHDL 3 10/30/2015   Lab Results  Component Value Date   HGBA1C 5.3 08/02/2015       Assessment & Plan:   Problem List Items Addressed This Visit    Atrial fibrillation ([place])    Rate controlled, tolerating coumadin.      Relevant Medications   furosemide (LASIX) 20 MG tablet   Essential hypertension    Well  controlled, no changes to meds. Encouraged heart healthy diet such as the DASH diet and exercise as tolerated.       Relevant Medications   furosemide (LASIX) 20 MG tablet   Other Relevant Orders   CBC (Completed)   Comprehensive metabolic panel (Completed)   HYPERCHOLESTEROLEMIA    Tolerating statin, encouraged heart healthy diet, avoid trans fats, minimize simple carbs and saturated fats. Increase exercise as tolerated      Relevant Medications   furosemide (LASIX) 20 MG tablet   Other Relevant Orders   Lipid panel (Completed)   Hypothyroidism - Primary    On Levothyroxine,  continue to monitor      Relevant Orders   TSH (Completed)   Vitamin D deficiency    Continue daily supplements       Other Visit Diagnoses    Need for Tdap vaccination        Relevant Orders    Tdap vaccine greater than or equal to 7yo IM (Completed)    Need for prophylactic vaccination against Streptococcus pneumoniae (pneumococcus)        Relevant Orders    Pneumococcal conjugate vaccine 13-valent IM (Completed)       I am having Ms. Leopard start on traMADol. I am also having her maintain her vitamin B-12, gabapentin, atenolol, multivitamin with minerals, cholecalciferol, polyvinyl alcohol, warfarin, pravastatin, NEXIUM, trimethoprim, amiodarone, and furosemide.  Meds ordered this encounter  Medications  . furosemide (LASIX) 20 MG tablet    Sig: Take 1 tablet (20 mg total) by mouth daily.    Dispense:  90 tablet    Refill:  1  . traMADol (ULTRAM) 50 MG tablet    Sig: Take 1 tablet (50 mg total) by mouth daily as needed for severe pain.    Dispense:  20 tablet    Refill:  0     Stacy Nasreen Goedecke,MD

## 2015-10-30 NOTE — Progress Notes (Signed)
Pre visit review using our clinic review tool, if applicable. No additional management support is needed unless otherwise documented below in the visit note. 

## 2015-10-30 NOTE — Patient Instructions (Signed)

## 2015-10-31 LAB — LIPID PANEL
CHOLESTEROL: 146 mg/dL (ref 0–200)
HDL: 50.3 mg/dL (ref >39.00)
LDL Cholesterol: 68 mg/dL (ref 0–99)
NonHDL: 95.68
TRIGLYCERIDES: 137 mg/dL (ref 0.0–149.0)
Total CHOL/HDL Ratio: 3
VLDL: 27.4 mg/dL (ref 0.0–40.0)

## 2015-10-31 LAB — COMPREHENSIVE METABOLIC PANEL
ALBUMIN: 3.8 g/dL (ref 3.5–5.2)
ALT: 19 U/L (ref 0–35)
AST: 27 U/L (ref 0–37)
Alkaline Phosphatase: 55 U/L (ref 39–117)
BUN: 26 mg/dL — AB (ref 6–23)
CHLORIDE: 108 meq/L (ref 96–112)
CO2: 25 meq/L (ref 19–32)
CREATININE: 1.97 mg/dL — AB (ref 0.40–1.20)
Calcium: 9.2 mg/dL (ref 8.4–10.5)
GFR: 25.47 mL/min — ABNORMAL LOW (ref >60.00)
GLUCOSE: 76 mg/dL (ref 70–99)
POTASSIUM: 4.8 meq/L (ref 3.5–5.1)
SODIUM: 140 meq/L (ref 135–145)
Total Bilirubin: 0.3 mg/dL (ref 0.2–1.2)
Total Protein: 7 g/dL (ref 6.0–8.3)

## 2015-10-31 LAB — CBC
HCT: 37 % (ref 36.0–46.0)
Hemoglobin: 12 g/dL (ref 12.0–15.0)
MCHC: 32.5 g/dL (ref 30.0–36.0)
MCV: 91.1 fl (ref 78.0–100.0)
Platelets: 194 10*3/uL (ref 150.0–400.0)
RBC: 4.05 Mil/uL (ref 3.87–5.11)
RDW: 13.9 % (ref 11.5–15.5)
WBC: 6.4 10*3/uL (ref 4.0–10.5)

## 2015-10-31 LAB — TSH: TSH: 5.35 u[IU]/mL — AB (ref 0.35–4.50)

## 2015-11-01 DIAGNOSIS — C44629 Squamous cell carcinoma of skin of left upper limb, including shoulder: Secondary | ICD-10-CM | POA: Diagnosis not present

## 2015-11-01 DIAGNOSIS — L578 Other skin changes due to chronic exposure to nonionizing radiation: Secondary | ICD-10-CM | POA: Diagnosis not present

## 2015-11-01 DIAGNOSIS — L570 Actinic keratosis: Secondary | ICD-10-CM | POA: Diagnosis not present

## 2015-11-02 ENCOUNTER — Other Ambulatory Visit: Payer: Self-pay | Admitting: Family Medicine

## 2015-11-02 DIAGNOSIS — N289 Disorder of kidney and ureter, unspecified: Secondary | ICD-10-CM

## 2015-11-02 MED ORDER — LEVOTHYROXINE SODIUM 137 MCG PO TABS: 137.0000 ug | ORAL_TABLET | Freq: Every day | ORAL | Status: DC

## 2015-11-02 NOTE — Telephone Encounter (Signed)
Lab entered and new thryoid strength sent in.

## 2015-11-05 ENCOUNTER — Encounter: Payer: Self-pay | Admitting: Family Medicine

## 2015-11-05 NOTE — Assessment & Plan Note (Signed)
Rate controlled, tolerating coumadin 

## 2015-11-05 NOTE — Assessment & Plan Note (Signed)
On Levothyroxine, continue to monitor 

## 2015-11-05 NOTE — Assessment & Plan Note (Signed)
Tolerating statin, encouraged heart healthy diet, avoid trans fats, minimize simple carbs and saturated fats. Increase exercise as tolerated 

## 2015-11-05 NOTE — Assessment & Plan Note (Signed)
Well controlled, no changes to meds. Encouraged heart healthy diet such as the DASH diet and exercise as tolerated.  °

## 2015-11-05 NOTE — Assessment & Plan Note (Signed)
Continue daily supplements

## 2015-11-10 ENCOUNTER — Ambulatory Visit (INDEPENDENT_AMBULATORY_CARE_PROVIDER_SITE_OTHER): Payer: Medicare Other | Admitting: Pharmacist Clinician (PhC)/ Clinical Pharmacy Specialist

## 2015-11-10 DIAGNOSIS — Z7901 Long term (current) use of anticoagulants: Secondary | ICD-10-CM | POA: Diagnosis not present

## 2015-11-10 DIAGNOSIS — I4891 Unspecified atrial fibrillation: Secondary | ICD-10-CM

## 2015-11-10 LAB — POCT INR: INR: 1.7

## 2015-11-16 DIAGNOSIS — C44629 Squamous cell carcinoma of skin of left upper limb, including shoulder: Secondary | ICD-10-CM | POA: Diagnosis not present

## 2015-11-24 ENCOUNTER — Ambulatory Visit (INDEPENDENT_AMBULATORY_CARE_PROVIDER_SITE_OTHER): Payer: Medicare Other | Admitting: Pharmacist Clinician (PhC)/ Clinical Pharmacy Specialist

## 2015-11-24 DIAGNOSIS — Z7901 Long term (current) use of anticoagulants: Secondary | ICD-10-CM | POA: Diagnosis not present

## 2015-11-24 DIAGNOSIS — I4891 Unspecified atrial fibrillation: Secondary | ICD-10-CM

## 2015-11-24 LAB — POCT INR: INR: 3.1

## 2015-12-02 ENCOUNTER — Other Ambulatory Visit: Payer: Self-pay | Admitting: Cardiovascular Disease

## 2015-12-04 ENCOUNTER — Other Ambulatory Visit (INDEPENDENT_AMBULATORY_CARE_PROVIDER_SITE_OTHER): Payer: Medicare Other

## 2015-12-04 DIAGNOSIS — N289 Disorder of kidney and ureter, unspecified: Secondary | ICD-10-CM

## 2015-12-04 LAB — COMPREHENSIVE METABOLIC PANEL
ALK PHOS: 56 U/L (ref 39–117)
ALT: 17 U/L (ref 0–35)
AST: 23 U/L (ref 0–37)
Albumin: 4.1 g/dL (ref 3.5–5.2)
BUN: 21 mg/dL (ref 6–23)
CHLORIDE: 108 meq/L (ref 96–112)
CO2: 27 mEq/L (ref 19–32)
Calcium: 9.6 mg/dL (ref 8.4–10.5)
Creatinine, Ser: 1.56 mg/dL — ABNORMAL HIGH (ref 0.40–1.20)
GFR: 33.33 mL/min — AB (ref >60.00)
GLUCOSE: 91 mg/dL (ref 70–99)
POTASSIUM: 4.9 meq/L (ref 3.5–5.1)
SODIUM: 141 meq/L (ref 135–145)
Total Bilirubin: 0.3 mg/dL (ref 0.2–1.2)
Total Protein: 7.2 g/dL (ref 6.0–8.3)

## 2015-12-04 NOTE — Telephone Encounter (Signed)
Rx request sent to pharmacy.  

## 2015-12-06 ENCOUNTER — Ambulatory Visit (INDEPENDENT_AMBULATORY_CARE_PROVIDER_SITE_OTHER): Payer: Medicare Other | Admitting: *Deleted

## 2015-12-06 DIAGNOSIS — I495 Sick sinus syndrome: Secondary | ICD-10-CM

## 2015-12-06 DIAGNOSIS — I442 Atrioventricular block, complete: Secondary | ICD-10-CM | POA: Diagnosis not present

## 2015-12-06 DIAGNOSIS — Z950 Presence of cardiac pacemaker: Secondary | ICD-10-CM

## 2015-12-06 NOTE — Progress Notes (Signed)
Remote pacemaker transmission.   

## 2015-12-22 ENCOUNTER — Ambulatory Visit (INDEPENDENT_AMBULATORY_CARE_PROVIDER_SITE_OTHER): Payer: Medicare Other | Admitting: Pharmacist Clinician (PhC)/ Clinical Pharmacy Specialist

## 2015-12-22 DIAGNOSIS — Z7901 Long term (current) use of anticoagulants: Secondary | ICD-10-CM

## 2015-12-22 DIAGNOSIS — I4891 Unspecified atrial fibrillation: Secondary | ICD-10-CM | POA: Diagnosis not present

## 2015-12-22 LAB — POCT INR: INR: 2.6

## 2015-12-28 DIAGNOSIS — N3020 Other chronic cystitis without hematuria: Secondary | ICD-10-CM | POA: Diagnosis not present

## 2015-12-28 DIAGNOSIS — N3946 Mixed incontinence: Secondary | ICD-10-CM | POA: Diagnosis not present

## 2015-12-28 DIAGNOSIS — Z0000 Encounter for general adult medical examination without abnormal findings: Secondary | ICD-10-CM | POA: Diagnosis not present

## 2016-01-02 LAB — CUP PACEART REMOTE DEVICE CHECK
Battery Remaining Longevity: 92 mo
Battery Remaining Percentage: 81 %
Battery Voltage: 2.93 V
Brady Statistic RA Percent Paced: 99 %
Implantable Lead Implant Date: 19980729
Lead Channel Impedance Value: 360 Ohm
Lead Channel Impedance Value: 490 Ohm
Lead Channel Pacing Threshold Amplitude: 0.5 V
Lead Channel Setting Pacing Amplitude: 1.375
Lead Channel Setting Pacing Pulse Width: 0.4 ms
Lead Channel Setting Sensing Sensitivity: 4 mV
MDC IDC LEAD IMPLANT DT: 19980729
MDC IDC LEAD LOCATION: 753859
MDC IDC LEAD LOCATION: 753860
MDC IDC MSMT LEADCHNL RA PACING THRESHOLD PULSEWIDTH: 0.4 ms
MDC IDC MSMT LEADCHNL RV PACING THRESHOLD AMPLITUDE: 1.125 V
MDC IDC MSMT LEADCHNL RV PACING THRESHOLD PULSEWIDTH: 0.4 ms
MDC IDC PG SERIAL: 7338831
MDC IDC SESS DTM: 20170222153933
MDC IDC SET LEADCHNL RA PACING AMPLITUDE: 1.5 V
MDC IDC STAT BRADY AP VP PERCENT: 99 %
MDC IDC STAT BRADY AP VS PERCENT: 1 %
MDC IDC STAT BRADY AS VP PERCENT: 1 %
MDC IDC STAT BRADY AS VS PERCENT: 1 %
MDC IDC STAT BRADY RV PERCENT PACED: 99 %

## 2016-01-03 ENCOUNTER — Encounter: Payer: Self-pay | Admitting: Cardiology

## 2016-01-05 ENCOUNTER — Other Ambulatory Visit: Payer: Self-pay | Admitting: Medical

## 2016-01-11 ENCOUNTER — Other Ambulatory Visit: Payer: Self-pay | Admitting: Family Medicine

## 2016-01-18 DIAGNOSIS — H40003 Preglaucoma, unspecified, bilateral: Secondary | ICD-10-CM | POA: Diagnosis not present

## 2016-01-18 DIAGNOSIS — D3132 Benign neoplasm of left choroid: Secondary | ICD-10-CM | POA: Diagnosis not present

## 2016-01-19 ENCOUNTER — Ambulatory Visit (INDEPENDENT_AMBULATORY_CARE_PROVIDER_SITE_OTHER): Payer: Medicare Other | Admitting: Pharmacist Clinician (PhC)/ Clinical Pharmacy Specialist

## 2016-01-19 DIAGNOSIS — I4891 Unspecified atrial fibrillation: Secondary | ICD-10-CM | POA: Diagnosis not present

## 2016-01-19 DIAGNOSIS — Z7901 Long term (current) use of anticoagulants: Secondary | ICD-10-CM

## 2016-01-19 LAB — POCT INR: INR: 4.6

## 2016-01-22 ENCOUNTER — Other Ambulatory Visit: Payer: Self-pay | Admitting: Cardiovascular Disease

## 2016-01-23 DIAGNOSIS — C44329 Squamous cell carcinoma of skin of other parts of face: Secondary | ICD-10-CM | POA: Diagnosis not present

## 2016-01-23 DIAGNOSIS — L570 Actinic keratosis: Secondary | ICD-10-CM | POA: Diagnosis not present

## 2016-02-02 ENCOUNTER — Ambulatory Visit (INDEPENDENT_AMBULATORY_CARE_PROVIDER_SITE_OTHER): Payer: Medicare Other | Admitting: Pharmacist Clinician (PhC)/ Clinical Pharmacy Specialist

## 2016-02-02 DIAGNOSIS — Z7901 Long term (current) use of anticoagulants: Secondary | ICD-10-CM | POA: Diagnosis not present

## 2016-02-02 DIAGNOSIS — I4891 Unspecified atrial fibrillation: Secondary | ICD-10-CM | POA: Diagnosis not present

## 2016-02-02 LAB — POCT INR: INR: 2.1

## 2016-02-05 ENCOUNTER — Other Ambulatory Visit: Payer: Self-pay | Admitting: Family Medicine

## 2016-02-20 ENCOUNTER — Ambulatory Visit (INDEPENDENT_AMBULATORY_CARE_PROVIDER_SITE_OTHER): Payer: Medicare Other | Admitting: Pharmacist

## 2016-02-20 ENCOUNTER — Encounter: Payer: Self-pay | Admitting: Cardiovascular Disease

## 2016-02-20 ENCOUNTER — Encounter: Payer: Medicare Other | Admitting: Pharmacist Clinician (PhC)/ Clinical Pharmacy Specialist

## 2016-02-20 ENCOUNTER — Ambulatory Visit (INDEPENDENT_AMBULATORY_CARE_PROVIDER_SITE_OTHER): Payer: Medicare Other | Admitting: Cardiovascular Disease

## 2016-02-20 VITALS — BP 115/70 | HR 76 | Ht 66.0 in | Wt 195.2 lb

## 2016-02-20 DIAGNOSIS — I480 Paroxysmal atrial fibrillation: Secondary | ICD-10-CM | POA: Diagnosis not present

## 2016-02-20 DIAGNOSIS — E7800 Pure hypercholesterolemia, unspecified: Secondary | ICD-10-CM

## 2016-02-20 DIAGNOSIS — I4891 Unspecified atrial fibrillation: Secondary | ICD-10-CM | POA: Diagnosis not present

## 2016-02-20 DIAGNOSIS — Z7901 Long term (current) use of anticoagulants: Secondary | ICD-10-CM | POA: Diagnosis not present

## 2016-02-20 DIAGNOSIS — I495 Sick sinus syndrome: Secondary | ICD-10-CM | POA: Diagnosis not present

## 2016-02-20 DIAGNOSIS — I442 Atrioventricular block, complete: Secondary | ICD-10-CM | POA: Diagnosis not present

## 2016-02-20 DIAGNOSIS — Z950 Presence of cardiac pacemaker: Secondary | ICD-10-CM

## 2016-02-20 LAB — POCT INR: INR: 2.5

## 2016-02-20 MED ORDER — AMIODARONE HCL 200 MG PO TABS: 100.0000 mg | ORAL_TABLET | Freq: Every day | ORAL | Status: DC

## 2016-02-20 NOTE — Progress Notes (Signed)
Patient ID: Jill Washington, female   DOB: March 31, 1926, 82 y.o.   MRN: [id number]    Cardiology Office Note    Date:  02/20/2016   ID:  Jill Washington, DOB Jan 02, 1926, MRN [id number]  PCP:  [name], MD  Cardiologist:   Sanda Klein, MD   Chief Complaint  Patient presents with  . Follow-up    Pacer check, no chest pain    History of Present Illness:  Jill Washington is a 82 y.o. female Complete heart block, sinus node dysfunction, Paroxysmal atrial fibrillation,, here for pacemaker check.  She is back at home and has recovered from her severe pneumonia with protracted respiratory failure last fall. She denies anginal chest pain, syncope, palpitations. She has chronic lower extremity edema with venous stasis dermatitis, no worse than usual. She has a small skin laceration on the outside surface of her right calf. It is a little sore, but she does not have redness or fever. She has not had problems with fever or chills since hospital discharge  Pacemaker interrogation shows normal device function. She has100% atrial pacing and is pacemaker dependent with 100% ventricular pacing due to complete heart block. She has had an overall burden of atrial mode switch of <1%, Mostly brief atrial tachycardia, not true atrial fibrillation. There are no high ventricular rates.   Past Medical History  Diagnosis Date  . History of empyema of pleura   . Hypertension   . Mitral valve prolapse   . History of atrial fibrillation   . Sick sinus syndrome ([place])   . Venous insufficiency   . Hypercholesteremia   . Hypothyroidism   . Hiatal hernia   . IBS (irritable bowel syndrome)   . Colon polyps     hyperplastic  . Fibromyalgia   . Vitamin D deficiency   . Peripheral neuropathy ([place])   . Critical illness polyneuropathy ([place])   . History of skin cancer     face  . Diverticulosis of sigmoid colon 02/05/2007    mild  . GERD (gastroesophageal reflux disease)   . Pacemaker   . Arthritis   .  History of gallstones   . Fecal incontinence   . Urinary incontinence   . Blood transfusion   . Chicken pox as a child  . Measles as a child  . Mumps as a child  . Shingles 8 yrs ago  . Overactive bladder 05/07/2011  . CHB (complete heart block) ([place]) 11/10/2014  . Medicare annual wellness visit, subsequent 05/15/2015    Allergies verified: UTD   Immunization Status: Flu vaccine-- NA  Tdap-- 9-10 years ago per patient  PNA-- 09/30/07 (23)  Shingles-- 04/07/14   A/P:  Changes to [place], [place] or Personal Hx: UTD Pap-- none recent  MMG--declines further MMgs (last 08/12/03 BI-RADS 1: Neg)  Bone Density-- 08/11/03 with Prescott Gum, MD- Normal, declines further testing CCS-- 05/07/12 with Silvano Rusk, MD- polyp removed- no   . History of chicken pox   . H/O measles   . H/O mumps     Past Surgical History  Procedure Laterality Date  . Cervical spine surgery    . Cholecystectomy  03/2005    by Dr. Dalbert Batman  . Total abdominal hysterectomy w/ bilateral salpingoophorectomy    . Colonoscopy  02/05/2007,02/05/05    Dr. Silvano Rusk  . Esophagogastroduodenoscopy  02/05/2005,01/22/90    Dr. Silvano Rusk, Dr. Rachelle Hora  . Pacemaker implanted  05/11/97,08/02/05    [facility]  . Appendectomy  1971  .  Tonsillectomy    . Cardiac catheterization  05/10/1997    Normal coronaries, MVP  . Nm myocar perf wall motion  08/24/2010    Normal  . Pacemaker generator change N/A 02/25/2012    Procedure: PACEMAKER GENERATOR CHANGE;  Surgeon: Sanda Klein, MD;  Location: [facility];  Service: Cardiovascular;  Laterality: N/A;    Current Medications: Outpatient Prescriptions Prior to Visit  Medication Sig Dispense Refill  . atenolol (TENORMIN) 50 MG tablet TAKE ONE AND ONE-HALF TABLETS DAILY 135 tablet 0  . cholecalciferol (VITAMIN D) 1000 UNITS tablet Take 1,000 Units by mouth daily.    . furosemide (LASIX) 20 MG tablet Take 1 tablet (20 mg total) by mouth daily. 90 tablet 1  . gabapentin (NEURONTIN) 300 MG capsule  TAKE 1 CAPSULE TWICE A DAY 180 capsule 2  . levothyroxine (SYNTHROID, LEVOTHROID) 137 MCG tablet TAKE 1 TABLET DAILY 90 tablet 2  . Multiple Vitamin (MULTIVITAMIN WITH MINERALS) TABS tablet Take 1 tablet by mouth daily.    Marland Kitchen NEXIUM 40 MG capsule TAKE 1 CAPSULE DAILY BEFORE BREAKFAST 90 capsule 1  . polyvinyl alcohol (ARTIFICIAL TEARS) 1.4 % ophthalmic solution Place 1 drop into both eyes 3 (three) times daily as needed for dry eyes.    . pravastatin (PRAVACHOL) 40 MG tablet TAKE 1 TABLET DAILY 90 tablet 1  . traMADol (ULTRAM) 50 MG tablet Take 1 tablet (50 mg total) by mouth daily as needed for severe pain. 20 tablet 0  . trimethoprim (TRIMPEX) 100 MG tablet Take 100 mg by mouth daily.    . valsartan-hydrochlorothiazide (DIOVAN-HCT) 80-12.5 MG tablet TAKE 1 TABLET DAILY 90 tablet 0  . vitamin B-12 (CYANOCOBALAMIN) 500 MCG tablet Take 500 mcg by mouth daily.    Marland Kitchen warfarin (COUMADIN) 2 MG tablet TAKE 1 TABLET DAILY OR AS DIRECTED 90 tablet 0  . amiodarone (PACERONE) 200 MG tablet Take 1 tablet (200 mg total) by mouth daily. 90 tablet 3   No facility-administered medications prior to visit.     Allergies:   Cymbalta; Iodinated diagnostic agents; and Other   Social History   Social History  . Marital Status: Widowed    Spouse Name: N/A  . Number of Children: 3  . Years of Education: N/A   Occupational History  . retired    Social History Main Topics  . Smoking status: Former Smoker -- 2.00 packs/day for 6 years    Types: Cigarettes    Quit date: 10/15/1951  . Smokeless tobacco: Never Used  . Alcohol Use: No  . Drug Use: No  . Sexual Activity: Not Asked   Other Topics Concern  . None   Social History Narrative   She is a widow, one son to daughters. She is retired.     Family History:  The patient's family history includes Alcohol abuse in her daughter; Cancer (age of onset: 16) in her sister; Diabetes in her brother, brother, daughter, father, sister, sister, and son;  Diabetes (age of onset: 97) in her mother; Gallbladder disease in her father; Heart attack in her brother; Hypertension in her daughter and sister; Pancreatic cancer in her mother; Stomach cancer in her maternal aunt.   ROS:   Please see the history of present illness.    ROS All other systems reviewed and are negative.   PHYSICAL EXAM:   VS:  BP 115/70 mmHg  Pulse 76  Ht 5\' 6"  (1.676 m)  Wt 88.565 kg (195 lb 4 oz)  BMI 31.53 kg/m2  GEN: Well nourished, well developed, in no acute distress HEENT: normal Neck: no JVD, carotid bruits, or masses Cardiac: Paradoxically split S2,. RRR; no murmurs, rubs, or gallops,no edema, Healthy subclavian pacemaker site Respiratory:  clear to auscultation bilaterally, normal work of breathing GI: soft, nontender, nondistended, + BS MS: no deformity or atrophy Skin: warm and dry, no rash Neuro:  Alert and Oriented x 3, Strength and sensation are intact Psych: euthymic mood, full affect  Wt Readings from Last 3 Encounters:  02/20/16 88.565 kg (195 lb 4 oz)  10/30/15 89.132 kg (196 lb 8 oz)  09/04/15 87.998 kg (194 lb)      Studies/Labs Reviewed:   EKG:  EKG is ordered today.  The ekg ordered today demonstrates AV sequential paced rhythm  Recent Labs: 08/01/2015: B Natriuretic Peptide 585.2* 08/07/2015: Magnesium 2.0 10/30/2015: Hemoglobin 12.0; Platelets 194.0; TSH 5.35* 12/04/2015: ALT 17; BUN 21; Creatinine, Ser 1.56*; Potassium 4.9; Sodium 141   Lipid Panel    Component Value Date/Time   CHOL 146 10/30/2015 1521   TRIG 137.0 10/30/2015 1521   HDL 50.30 10/30/2015 1521   CHOLHDL 3 10/30/2015 1521   VLDL 27.4 10/30/2015 1521   LDLCALC 68 10/30/2015 1521   LDLDIRECT 77.0 04/28/2015 1202    ASSESSMENT:    1. SICK SINUS SYNDROME   2. CHB (complete heart block) ([facility])   3. Paroxysmal atrial fibrillation ([place])   4. Long term current use of anticoagulant therapy   5. HYPERCHOLESTEROLEMIA   6. Pacemaker -  [employer] leads implanted in  1998 generator change 2006 and 2013      PLAN:  In order of problems listed above:  1. SSS: increased frequency of atrial pacing can be attributed to amiodarone. 2. CHB: pacemaker dependent (precedes amiodarone use) 3. Afib: markedly reduced burden of atrial arrhythmia. Reduce amiodarone to 100 mg daily. Recently checked liver and thyroid tests were normal. Continue warfarin anticoagulation. CHADSVasc at least 3.  4. Warfarin: Well-tolerated, without Bleeding. Anticipate gradual increase  In warfarin dosage as amiodarone levels gradually drop 5. HLP: satisfactory lipid profile on recent assay; history of normal coronary arteries by angiography in 1998, normal nuclear stress test 2014 6. PPM: normal device function, remote [place] transmission every 3 months and office visit in 6 months.    Medication Adjustments/Labs and Tests Ordered: Current medicines are reviewed at length with the patient today.  Concerns regarding medicines are outlined above.  Medication changes, Labs and Tests ordered today are listed in the Patient Instructions below. Patient Instructions  Medication Instructions: Dr Sallyanne Kuster has recommended making the following medication changes: 1. DECREASE Amiodarone to 100 mg - take 0.5 tablet by mouth once daily  Labwork: NONE ORDERED  Testing/Procedures: 1. Remote Pacemaker Download - Remote monitoring is used to monitor your Pacemaker of ICD from home. This monitoring reduces the number of office visits required to check your device to one time per year. It allows [place] to keep an eye on the functioning of your device to ensure it is working properly. You are scheduled for a device check from home on Thursday, August 10th, 2017. You may send your transmission at any time that day. If you have a wireless device, the transmission will be sent automatically. After your physician reviews your transmission, you will receive a postcard with your next transmission  date.  Follow-up: Dr Sallyanne Kuster recommends that you schedule a follow-up appointment in 6 months with a pacemaker check. You will receive a reminder letter in the mail two months  in advance. If you don't receive a letter, please call our office to schedule the follow-up appointment.  If you need a refill on your cardiac medications before your next appointment, please call your pharmacy.    Mikael Spray, MD  02/20/2016 1:39 PM    [facility] [address], [place], [place]  [address] Phone: [phone]; Fax: [phone]

## 2016-02-20 NOTE — Patient Instructions (Signed)
Medication Instructions: Dr Sallyanne Kuster has recommended making the following medication changes: 1. DECREASE Amiodarone to 100 mg - take 0.5 tablet by mouth once daily  Labwork: NONE ORDERED  Testing/Procedures: 1. Remote Pacemaker Download - Remote monitoring is used to monitor your Pacemaker of ICD from home. This monitoring reduces the number of office visits required to check your device to one time per year. It allows [place] to keep an eye on the functioning of your device to ensure it is working properly. You are scheduled for a device check from home on Thursday, August 10th, 2017. You may send your transmission at any time that day. If you have a wireless device, the transmission will be sent automatically. After your physician reviews your transmission, you will receive a postcard with your next transmission date.  Follow-up: Dr Sallyanne Kuster recommends that you schedule a follow-up appointment in 6 months with a pacemaker check. You will receive a reminder letter in the mail two months in advance. If you don't receive a letter, please call our office to schedule the follow-up appointment.  If you need a refill on your cardiac medications before your next appointment, please call your pharmacy.

## 2016-02-23 ENCOUNTER — Other Ambulatory Visit: Payer: Self-pay | Admitting: Family Medicine

## 2016-03-01 ENCOUNTER — Ambulatory Visit (INDEPENDENT_AMBULATORY_CARE_PROVIDER_SITE_OTHER): Payer: Medicare Other | Admitting: Physician Assistant

## 2016-03-01 ENCOUNTER — Other Ambulatory Visit: Payer: Self-pay | Admitting: Cardiovascular Disease

## 2016-03-01 ENCOUNTER — Encounter: Payer: Self-pay | Admitting: Physician Assistant

## 2016-03-01 VITALS — BP 130/66 | HR 70 | Temp 97.9°F | Ht 66.0 in | Wt 195.0 lb

## 2016-03-01 DIAGNOSIS — R300 Dysuria: Secondary | ICD-10-CM

## 2016-03-01 DIAGNOSIS — R5382 Chronic fatigue, unspecified: Secondary | ICD-10-CM

## 2016-03-01 LAB — BASIC METABOLIC PANEL
BUN: 23 mg/dL (ref 6–23)
CHLORIDE: 108 meq/L (ref 96–112)
CO2: 25 meq/L (ref 19–32)
CREATININE: 2.11 mg/dL — AB (ref 0.40–1.20)
Calcium: 9.3 mg/dL (ref 8.4–10.5)
GFR: 23.51 mL/min — ABNORMAL LOW (ref >60.00)
GLUCOSE: 90 mg/dL (ref 70–99)
POTASSIUM: 4.9 meq/L (ref 3.5–5.1)
Sodium: 139 mEq/L (ref 135–145)

## 2016-03-01 LAB — POC URINALSYSI DIPSTICK (AUTOMATED)
BILIRUBIN UA: NEGATIVE
Glucose, UA: NEGATIVE
KETONES UA: NEGATIVE
NITRITE UA: NEGATIVE
Protein, UA: NEGATIVE
Spec Grav, UA: 1.025
Urobilinogen, UA: 0.2
pH, UA: 5.5

## 2016-03-01 LAB — CBC
HCT: 35.9 % — ABNORMAL LOW (ref 36.0–46.0)
HEMOGLOBIN: 11.9 g/dL — AB (ref 12.0–15.0)
MCHC: 33.1 g/dL (ref 30.0–36.0)
MCV: 94.3 fl (ref 78.0–100.0)
PLATELETS: 198 10*3/uL (ref 150.0–400.0)
RBC: 3.81 Mil/uL — ABNORMAL LOW (ref 3.87–5.11)
RDW: 13.9 % (ref 11.5–15.5)
WBC: 6.1 10*3/uL (ref 4.0–10.5)

## 2016-03-01 LAB — TSH: TSH: 2.83 u[IU]/mL (ref 0.35–4.50)

## 2016-03-01 MED ORDER — CEPHALEXIN 500 MG PO CAPS: 500.0000 mg | ORAL_CAPSULE | Freq: Two times a day (BID) | ORAL | Status: DC

## 2016-03-01 NOTE — Patient Instructions (Signed)
Please stop your daily Trimethoprim. Start the Keflex as directed. Continue all other medications as directed. Stay well hydrated.  I will call you with your results and we will alter regimen if indicated based on the results.

## 2016-03-01 NOTE — Progress Notes (Signed)
Patient presents to clinic today c/o fatigue even with minimal household chores over the past week. Denies fever, chills. Endorses some aches over the past 4-5 days, for which she is taking Tramadol with good relief. Does have history of OAB and recurrent UTI. Is prescribed Myrbetriq and a daily Trimethoprim. Is taking as directed but has noted increased urgency, frequency and dysuria. Denies hematuria, nausea or vomiting. Denies flank pain.  Past Medical History  Diagnosis Date   History of empyema of pleura    Hypertension    Mitral valve prolapse    History of atrial fibrillation    Sick sinus syndrome (HCC)    Venous insufficiency    Hypercholesteremia    Hypothyroidism    Hiatal hernia    IBS (irritable bowel syndrome)    Colon polyps     hyperplastic   Fibromyalgia    Vitamin D deficiency    Peripheral neuropathy (HCC)    Critical illness polyneuropathy ([place])    History of skin cancer     face   Diverticulosis of sigmoid colon 02/05/2007    mild   GERD (gastroesophageal reflux disease)    Pacemaker    Arthritis    History of gallstones    Fecal incontinence    Urinary incontinence    Blood transfusion    Chicken pox as a child   Measles as a child   Mumps as a child   Shingles 8 yrs ago   Overactive bladder 05/07/2011   CHB (complete heart block) ([place]) 11/10/2014   Medicare annual wellness visit, subsequent 05/15/2015    Allergies verified: UTD   Immunization Status: Flu vaccine-- NA  Tdap-- 9-10 years ago per patient  PNA-- 09/30/07 (23)  Shingles-- 04/07/14   A/P:  Changes to [place], [place] or Personal Hx: UTD Pap-- none recent  MMG--declines further MMgs (last 08/12/03 BI-RADS 1: Neg)  Bone Density-- 08/11/03 with Prescott Gum, MD- Normal, declines further testing CCS-- 05/07/12 with Silvano Rusk, MD- polyp removed- no    History of chicken pox    H/O measles    H/O mumps     Current Outpatient Prescriptions on File Prior to Visit    Medication Sig Dispense Refill   amiodarone (PACERONE) 200 MG tablet Take 0.5 tablets (100 mg total) by mouth daily. 45 tablet 3   atenolol (TENORMIN) 50 MG tablet TAKE ONE AND ONE-HALF TABLETS DAILY 135 tablet 0   cholecalciferol (VITAMIN D) 1000 UNITS tablet Take 1,000 Units by mouth daily.     furosemide (LASIX) 20 MG tablet Take 1 tablet (20 mg total) by mouth daily. 90 tablet 1   gabapentin (NEURONTIN) 300 MG capsule TAKE 1 CAPSULE TWICE A DAY 180 capsule 2   levothyroxine (SYNTHROID, LEVOTHROID) 137 MCG tablet TAKE 1 TABLET DAILY 90 tablet 2   Multiple Vitamin (MULTIVITAMIN WITH MINERALS) TABS tablet Take 1 tablet by mouth daily.     MYRBETRIQ 50 MG TB24 tablet Take 1 tablet by mouth daily.     NEXIUM 40 MG capsule TAKE 1 CAPSULE DAILY BEFORE BREAKFAST 90 capsule 0   polyvinyl alcohol (ARTIFICIAL TEARS) 1.4 % ophthalmic solution Place 1 drop into both eyes 3 (three) times daily as needed for dry eyes.     pravastatin (PRAVACHOL) 40 MG tablet TAKE 1 TABLET DAILY 90 tablet 1   traMADol (ULTRAM) 50 MG tablet Take 1 tablet (50 mg total) by mouth daily as needed for severe pain. 20 tablet 0   trimethoprim (TRIMPEX) 100 MG tablet  Take 100 mg by mouth daily.     valsartan-hydrochlorothiazide (DIOVAN-HCT) 80-12.5 MG tablet TAKE 1 TABLET DAILY 90 tablet 0   vitamin B-12 (CYANOCOBALAMIN) 500 MCG tablet Take 500 mcg by mouth daily.     warfarin (COUMADIN) 2 MG tablet TAKE 1 TABLET DAILY OR AS DIRECTED 90 tablet 0   No current facility-administered medications on file prior to visit.    Allergies  Allergen Reactions   Cymbalta [Duloxetine Hcl] Other (See Comments)    Could not think straight   Iodinated Diagnostic Agents Other (See Comments)    Pt was not told what the reaction was   Other Itching    Dial Soap    Family History  Problem Relation Age of Onset   Pancreatic cancer Mother    Diabetes Mother 68    type 2   Stomach cancer Maternal Aunt    Heart  attack Brother    Diabetes Brother     type 2   Gallbladder disease Father    Diabetes Father     type 2   Diabetes Brother     X 2    Cancer Sister 5    skin ca on face   Diabetes Sister     type 2   Colon cancer     Diabetes Daughter     type 2   Hypertension Daughter    Alcohol abuse Daughter    Diabetes Son     type 2   Diabetes Sister     type 1   Hypertension Sister     Social History   Social History   Marital Status: Widowed    Spouse Name: N/A   Number of Children: 3   Years of Education: N/A   Occupational History   retired    Social History Main Topics   Smoking status: Former Smoker -- 2.00 packs/day for 6 years    Types: Cigarettes    Quit date: 10/15/1951   Smokeless tobacco: Never Used   Alcohol Use: No   Drug Use: No   Sexual Activity: Not Asked   Other Topics Concern   None   Social History Narrative   She is a widow, one son to daughters. She is retired.    Review of Systems - See HPI.  All other ROS are negative.  BP 130/66 mmHg   Pulse 70   Temp(Src) 97.9 F (36.6 C) (Oral)   Ht '5\' 6"'$  (1.676 m)   Wt 195 lb (88.451 kg)   BMI 31.49 kg/m2   SpO2 96%  Physical Exam  Constitutional: She is well-developed, well-nourished, and in no distress.  HENT:  Head: Normocephalic and atraumatic.  Right Ear: External ear normal.  Left Ear: External ear normal.  Nose: Nose normal.  Mouth/Throat: Oropharynx is clear and moist. No oropharyngeal exudate.  TM within normal limits.   Eyes: Conjunctivae are normal.  Neck: Neck supple.  Cardiovascular: Normal rate, regular rhythm, normal heart sounds and intact distal pulses.   Pulmonary/Chest: Effort normal and breath sounds normal. No respiratory distress. She has no wheezes. She has no rales. She exhibits no tenderness.  Abdominal: Soft. Bowel sounds are normal. She exhibits no distension. There is no tenderness.  Lymphadenopathy:    She has no cervical adenopathy.  Skin:  Skin is warm and dry. No rash noted.  Psychiatric: Affect normal.  Vitals reviewed.   Recent Results (from the past 2160 hour(s))  Comp Met (CMET)     Status: Abnormal  Collection Time: 12/04/15 10:54 AM  Result Value Ref Range   Sodium 141 135 - 145 mEq/L   Potassium 4.9 3.5 - 5.1 mEq/L   Chloride 108 96 - 112 mEq/L   CO2 27 19 - 32 mEq/L   Glucose, Bld 91 70 - 99 mg/dL   BUN 21 6 - 23 mg/dL   Creatinine, Ser 1.56 (H) 0.40 - 1.20 mg/dL   Total Bilirubin 0.3 0.2 - 1.2 mg/dL   Alkaline Phosphatase 56 39 - 117 U/L   AST 23 0 - 37 U/L   ALT 17 0 - 35 U/L   Total Protein 7.2 6.0 - 8.3 g/dL   Albumin 4.1 3.5 - 5.2 g/dL   Calcium 9.6 8.4 - 10.5 mg/dL   GFR 33.33 (L) >60.00 mL/min  Implantable device - remote     Status: None   Collection Time: 12/06/15  3:39 PM  Result Value Ref Range   Date Time Interrogation Session [phone]    Pulse Generator Manufacturer [employer]    Pulse Gen Model 2210 Accent DR RF    Pulse Gen Serial Number [id number]    Implantable Pulse Generator Type Implantable Pulse Generator    Implantable Pulse Generator Implant Date [id number]    Implantable Lead Manufacturer [employer]    Implantable Lead Model 1342T    Implantable Lead Serial Number [id number]    Implantable Lead Implant Date [id number]    Implantable Lead Location [id number]    Implantable Lead Manufacturer [employer]    Implantable Lead Model 1346T Passive Plus DX    Implantable Lead Serial Number [id number]    Implantable Lead Implant Date [id number]    Implantable Lead Location [phone]    Lead Channel Setting Sensing Sensitivity 4.0 mV   Lead Channel Setting Sensing Adaptation Mode Fixed Pacing    Lead Channel Setting Pacing Amplitude 1.5 V   Lead Channel Setting Pacing Pulse Width 0.4 ms   Lead Channel Setting Pacing Amplitude 1.375    Lead Channel Status     Lead Channel Impedance Value 360 ohm   Lead Channel Pacing Threshold Amplitude 0.5 V   Lead Channel Pacing Threshold Pulse Width 0.4 ms    Lead Channel Status     Lead Channel Impedance Value 490 ohm   Lead Channel Pacing Threshold Amplitude 1.125 V   Lead Channel Pacing Threshold Pulse Width 0.4 ms   Battery Status MOS    Battery Remaining Longevity 92 mo   Battery Remaining Percentage 81.0 %   Battery Voltage 2.93 V   Brady Statistic RA Percent Paced 99.0 %   Brady Statistic RV Percent Paced 99.0 %   Brady Statistic AP VP Percent 99.0 %   Brady Statistic AS VP Percent 1.0 %   Brady Statistic AP VS Percent 1.0 %   Brady Statistic AS VS Percent 1.0 %   Eval Rhythm ApVp   POCT INR     Status: None   Collection Time: 12/22/15 11:43 AM  Result Value Ref Range   INR 2.6   POCT INR     Status: None   Collection Time: 01/19/16 12:10 PM  Result Value Ref Range   INR 4.6   POCT INR     Status: None   Collection Time: 02/02/16 11:28 AM  Result Value Ref Range   INR 2.1   POCT INR     Status: None   Collection Time: 02/20/16 11:36 AM  Result Value Ref Range   INR 2.5     Assessment/Plan: 1.  Chronic fatigue Suspect viral illness with addition of acute cystitis. See Tx for cystitis. Exam itself is overall unremarkable. Patient with history of hypothyroidism with recent elevated TSH. Will check lab panel today.Urin - CBC - Basic Metabolic Panel (BMET) - TSH  2. Dysuria Urine dip with abnormal findings concerning for infection. Will send for culture. Continue Myrbetriq. Hold daily Trimethoprim for now. Will begin Keflex. Start daily probiotic. Supportive measures reviewed. Will alter regimen based on culture results. - POCT Urinalysis Dipstick (Automated) - CULTURE, URINE COMPREHENSIVE - cephALEXin (KEFLEX) 500 MG capsule; Take 1 capsule (500 mg total) by mouth 2 (two) times daily.  Dispense: 10 capsule; Refill: 0

## 2016-03-01 NOTE — Progress Notes (Signed)
Pre visit review using our clinic review tool, if applicable. No additional management support is needed unless otherwise documented below in the visit note. 

## 2016-03-02 LAB — CUP PACEART INCLINIC DEVICE CHECK
Implantable Lead Implant Date: 19980729
Implantable Lead Location: 753860
Lead Channel Setting Pacing Amplitude: 1.375
Lead Channel Setting Pacing Amplitude: 1.5 V
Lead Channel Setting Sensing Sensitivity: 4 mV
MDC IDC LEAD IMPLANT DT: 19980729
MDC IDC LEAD LOCATION: 753859
MDC IDC PG SERIAL: 7338831
MDC IDC SESS DTM: 20170520084032
MDC IDC SET LEADCHNL RV PACING PULSEWIDTH: 0.4 ms

## 2016-03-05 LAB — CULTURE, URINE COMPREHENSIVE

## 2016-03-06 ENCOUNTER — Encounter: Payer: Self-pay | Admitting: Cardiovascular Disease

## 2016-03-07 ENCOUNTER — Other Ambulatory Visit: Payer: Self-pay | Admitting: Family Medicine

## 2016-03-07 MED ORDER — CEFDINIR 300 MG PO CAPS: 300.0000 mg | ORAL_CAPSULE | Freq: Two times a day (BID) | ORAL | Status: DC

## 2016-03-13 ENCOUNTER — Telehealth: Payer: Self-pay | Admitting: Family Medicine

## 2016-03-13 MED ORDER — CEFDINIR 300 MG PO CAPS: 300.0000 mg | ORAL_CAPSULE | Freq: Two times a day (BID) | ORAL | Status: DC

## 2016-03-13 NOTE — Telephone Encounter (Signed)
Medication filled to local pharmacy as requested.

## 2016-03-13 NOTE — Telephone Encounter (Signed)
Please send script to local pharmacy.

## 2016-03-13 NOTE — Telephone Encounter (Signed)
Cody, please advise. Cefdinir 300 mg for UTI was ordered under Dr. Frederik Pear name, but per result notes, you are actually prescribing provider. Called [facility], and rx shipped Friday 5/26, so it cannot be cancelled. Do you want to send additional script to local pharmacy?

## 2016-03-13 NOTE — Telephone Encounter (Signed)
Caller name: Rod Holler Relationship to patient: daughter Can be reached: [phone]  Reason for call: Pt daughter called to f/u on RX from 5/25. It was sent to [facility] but needs to go to [facility]. It is abx for pt. Please send today.

## 2016-03-14 ENCOUNTER — Other Ambulatory Visit: Payer: Medicare Other

## 2016-03-18 ENCOUNTER — Other Ambulatory Visit (INDEPENDENT_AMBULATORY_CARE_PROVIDER_SITE_OTHER): Payer: Medicare Other

## 2016-03-18 DIAGNOSIS — R300 Dysuria: Secondary | ICD-10-CM | POA: Diagnosis not present

## 2016-03-18 LAB — BASIC METABOLIC PANEL
BUN: 22 mg/dL (ref 6–23)
CO2: 25 meq/L (ref 19–32)
Calcium: 9.4 mg/dL (ref 8.4–10.5)
Chloride: 108 mEq/L (ref 96–112)
Creatinine, Ser: 1.64 mg/dL — ABNORMAL HIGH (ref 0.40–1.20)
GFR: 31.44 mL/min — ABNORMAL LOW (ref >60.00)
GLUCOSE: 99 mg/dL (ref 70–99)
POTASSIUM: 4.6 meq/L (ref 3.5–5.1)
SODIUM: 141 meq/L (ref 135–145)

## 2016-03-19 ENCOUNTER — Ambulatory Visit (INDEPENDENT_AMBULATORY_CARE_PROVIDER_SITE_OTHER): Payer: Medicare Other | Admitting: Pharmacist Clinician (PhC)/ Clinical Pharmacy Specialist

## 2016-03-19 DIAGNOSIS — Z7901 Long term (current) use of anticoagulants: Secondary | ICD-10-CM | POA: Diagnosis not present

## 2016-03-19 DIAGNOSIS — I4891 Unspecified atrial fibrillation: Secondary | ICD-10-CM | POA: Diagnosis not present

## 2016-03-19 LAB — POCT INR: INR: 2.4

## 2016-04-03 ENCOUNTER — Telehealth: Payer: Self-pay | Admitting: Family Medicine

## 2016-04-03 NOTE — Telephone Encounter (Signed)
Pt called in because she says that she would like to have her Nexium changed to a different medication. Pt says that [facility]. Pt says that she's not sure of the medication that was suggested. Asked pt if she could look into it. She says that she don't understand, but she would try.    [phone]

## 2016-04-04 MED ORDER — ESOMEPRAZOLE MAGNESIUM 40 MG PO CPDR: DELAYED_RELEASE_CAPSULE | ORAL | Status: DC

## 2016-04-04 NOTE — Telephone Encounter (Signed)
It is fine with me to change her to a different PPI but do not know which one her insurance prefers if she wants [place] to guess I am ok to d/c Nexium and to start Pantoprazole 40 mg tab 1 tab po daily disp #90 with 1 rf ( they may say they do not cover this one also but unlikely) or she can check with them and get back to [place]

## 2016-04-04 NOTE — Telephone Encounter (Signed)
Spoke to the patient and she is going to stay on nexium for now and [facility].

## 2016-04-09 ENCOUNTER — Other Ambulatory Visit: Payer: Self-pay | Admitting: Family Medicine

## 2016-04-12 NOTE — Telephone Encounter (Addendum)
°  Relation to [id number] Call back [place]:   Reason for call:  Patient stats she has been taking esomeprazole (NEXIUM) 40 MG capsule since 1982 and insurance suggested asking PCP for omeprazole  40 MG capsule due to the price being cheaper. Please advise

## 2016-04-12 NOTE — Telephone Encounter (Signed)
OK to d/c nexium and try Omeprazole 40 mg tabs, 1 tab po daily disp #30 with 5 rf and let patient know

## 2016-04-15 MED ORDER — OMEPRAZOLE 40 MG PO CPDR: 40.0000 mg | DELAYED_RELEASE_CAPSULE | Freq: Every day | ORAL | Status: DC

## 2016-04-15 NOTE — Telephone Encounter (Signed)
Updated medication list

## 2016-04-15 NOTE — Telephone Encounter (Signed)
[facility]/ patient notified of change.

## 2016-04-15 NOTE — Addendum Note (Signed)
Addended by: Sharon Seller B on: 04/15/2016 11:36 AM   Modules accepted: Orders

## 2016-04-15 NOTE — Addendum Note (Signed)
Addended by: Sharon Seller B on: 04/15/2016 07:23 AM   Modules accepted: Orders, Medications

## 2016-04-17 ENCOUNTER — Ambulatory Visit (INDEPENDENT_AMBULATORY_CARE_PROVIDER_SITE_OTHER): Payer: Medicare Other | Admitting: Pharmacist

## 2016-04-17 DIAGNOSIS — Z7901 Long term (current) use of anticoagulants: Secondary | ICD-10-CM

## 2016-04-17 DIAGNOSIS — I4891 Unspecified atrial fibrillation: Secondary | ICD-10-CM | POA: Diagnosis not present

## 2016-04-17 LAB — POCT INR: INR: 2.3

## 2016-04-21 ENCOUNTER — Other Ambulatory Visit: Payer: Self-pay | Admitting: Cardiovascular Disease

## 2016-04-24 ENCOUNTER — Telehealth: Payer: Self-pay | Admitting: Family Medicine

## 2016-04-24 NOTE — Telephone Encounter (Signed)
Pt says that [facility] in regards to her Nexium Rx. Pt says that she was under the impression that it was resolved because she is taking prilosec. Pt would like the office to call and update so that they will stop calling her.

## 2016-04-24 NOTE — Telephone Encounter (Signed)
Tried calling [facility], on hold > 5 minutes, informed why I was calling and sent back to telephone tree.

## 2016-04-24 NOTE — Telephone Encounter (Signed)
Call Documentation      Robin B Ewing, CMA at 04/15/2016 11:36 AM     Status: Signed       Expand All Collapse All   [facility]/ patient notified of change.            Donell Sievert Ewing, CMA at 04/15/2016 7:23 AM     Status: Signed       Expand All Collapse All   Updated medication list.            Mosie Lukes, MD at 04/12/2016 4:54 PM     Status: Signed       Expand All Collapse All   OK to d/c nexium and try Omeprazole 40 mg tabs, 1 tab po daily disp #30 with 5 rf and let patient know            Oneta Rack at 04/12/2016 12:58 PM     Status: Addendum       Expand All Collapse All    Relation to [id number] Call back [place]:   Reason for call:  Patient stats she has been taking esomeprazole ([place]) 40 MG capsule since 1982 and insurance suggested asking PCP for omeprazole 40 MG capsule due to the price being cheaper. Please advise          Revision History       Date/Time User Action    > 04/12/2016 1:04 PM Tiffany Colin Rhein Addend     04/12/2016 12:59 PM Tiffany Colin Rhein Addend     04/12/2016 12:59 PM Tiffany Colin Rhein Sign              Robin B Ewing, CMA at 04/04/2016 2:01 PM     Status: Signed       Expand All Collapse All   Spoke to the patient and she is going to stay on nexium for now and [facility].            Mosie Lukes, MD at 04/04/2016 1:03 PM     Status: Signed       Expand All Collapse All   It is fine with me to change her to a different PPI but do not know which one her insurance prefers if she wants [place] to guess I am ok to d/c Nexium and to start Pantoprazole 40 mg tab 1 tab po daily disp #90 with 1 rf ( they may say they do not cover this one also but unlikely) or she can check with them and get back to [place]            Nani Ravens at 04/03/2016 12:09 PM     Status: Signed       Expand All Collapse All   Pt  called in because she says that she would like to have her Nexium changed to a different medication. Pt says that [facility]. Pt says that she's not sure of the medication that was suggested. Asked pt if she could look into it. She says that she don't understand, but she would try.    CB:[phone]            OV note from 04/03/2016 above. Will call [facility] and inform of change.

## 2016-04-26 ENCOUNTER — Telehealth: Payer: Self-pay

## 2016-04-26 NOTE — Telephone Encounter (Signed)
Called patient to reminder her of her Medicare Wellness appointment on 04/29/16.

## 2016-04-29 ENCOUNTER — Ambulatory Visit (INDEPENDENT_AMBULATORY_CARE_PROVIDER_SITE_OTHER): Payer: Medicare Other

## 2016-04-29 ENCOUNTER — Other Ambulatory Visit: Payer: Self-pay | Admitting: *Deleted

## 2016-04-29 VITALS — BP 138/78 | HR 77 | Resp 18 | Ht 66.0 in | Wt 193.4 lb

## 2016-04-29 DIAGNOSIS — H9193 Unspecified hearing loss, bilateral: Secondary | ICD-10-CM

## 2016-04-29 DIAGNOSIS — E538 Deficiency of other specified B group vitamins: Secondary | ICD-10-CM

## 2016-04-29 DIAGNOSIS — E559 Vitamin D deficiency, unspecified: Secondary | ICD-10-CM | POA: Diagnosis not present

## 2016-04-29 DIAGNOSIS — Z0000 Encounter for general adult medical examination without abnormal findings: Secondary | ICD-10-CM | POA: Diagnosis not present

## 2016-04-29 MED ORDER — TRAMADOL HCL 50 MG PO TABS: 50.0000 mg | ORAL_TABLET | Freq: Every day | ORAL | Status: DC | PRN

## 2016-04-29 NOTE — Assessment & Plan Note (Signed)
Lab Results  Component Value Date   [id number]* 04/28/2015   Not currently taking supplement. Due for recheck, future orders placed. Will send pt to lab after appt w/ Dr. Charlett Blake next week.

## 2016-04-29 NOTE — Assessment & Plan Note (Signed)
Taking OTC supplement as directed. Due for recheck per chart review. Future orders placed, will have drawn w/ labs at appt w/ Dr. Charlett Blake next week.

## 2016-04-29 NOTE — Telephone Encounter (Signed)
Last OV: 03/01/16 w/ [place] Last filled: 10/30/15, #20, 0 RF Sig: Take 1 tablet (50 mg total) by mouth daily as needed for severe pain. UDS: Not on file

## 2016-04-29 NOTE — Progress Notes (Signed)
Subjective:   Jill Washington is a 82 y.o. female who presents for Medicare Annual (Subsequent) preventive examination.  Review of Systems:  No ROS.  Medicare Wellness Visit.  Cardiac Risk Factors include: advanced age (>57men, >86 women);dyslipidemia;hypertension;obesity (BMI >30kg/m2);sedentary lifestyle  Sleep patterns: No sleep issues, gets up about 1x nightly to void.    Home Safety/Smoke Alarms:  Lives in alone in trailer, daughter lives behind her. Feels safe in home, smoke alarms present. No stairs going into house, has some stairs off back deck but pt does not go out there. Seat Belt Safety/Bike Helmet: Always wears seat belt.    Counseling:   Eye Exam- 20/20 vision per pt after laser eye surgery and cataract extraction, wears reading glasses. Sees Dr. Renaldo Fiddler yearly. Dental-Wears dentures upper and lower since 2001; does not see dentist regularly.  Female:   Pap-N/A       Mammo-N/A       Dexa scan-05/22/15 Osteopenia, pt on vitamin D supplement        CCS-last 05/07/12 w/ Dr. Carlean Purl; severe diverticulosis in sigmoid colon and external hemorrhoids, no recall     Objective:     Vitals: BP 138/78 mmHg  Pulse 77  Resp 18  Ht 5\' 6"  (1.676 m)  Wt 193 lb 6.4 oz (87.726 kg)  BMI 31.23 kg/m2  SpO2 97%  Body mass index is 31.23 kg/(m^2).   Tobacco History  Smoking status  . Former Smoker -- 2.00 packs/day for 6 years  . Types: Cigarettes  . Quit date: 10/15/1951  Smokeless tobacco  . Never Used     Counseling given: Not Answered   Past Medical History  Diagnosis Date  . History of empyema of pleura   . Hypertension   . Mitral valve prolapse   . History of atrial fibrillation   . Sick sinus syndrome ([place])   . Venous insufficiency   . Hypercholesteremia   . Hypothyroidism   . Hiatal hernia   . IBS (irritable bowel syndrome)   . Colon polyps     hyperplastic  . Fibromyalgia   . Vitamin D deficiency   . Peripheral neuropathy ([place])   . Critical illness  polyneuropathy ([place])   . History of skin cancer     face  . Diverticulosis of sigmoid colon 02/05/2007    mild  . GERD (gastroesophageal reflux disease)   . Pacemaker   . Arthritis   . History of gallstones   . Fecal incontinence   . Urinary incontinence   . Blood transfusion   . Chicken pox as a child  . Measles as a child  . Mumps as a child  . Shingles 8 yrs ago  . Overactive bladder 05/07/2011  . CHB (complete heart block) ([place]) 11/10/2014  . Medicare annual wellness visit, subsequent 05/15/2015    Allergies verified: UTD   Immunization Status: Flu vaccine-- NA  Tdap-- 9-10 years ago per patient  PNA-- 09/30/07 (23)  Shingles-- 04/07/14   A/P:  Changes to [place], [place] or Personal Hx: UTD Pap-- none recent  MMG--declines further MMgs (last 08/12/03 BI-RADS 1: Neg)  Bone Density-- 08/11/03 with Prescott Gum, MD- Normal, declines further testing CCS-- 05/07/12 with Silvano Rusk, MD- polyp removed- no   . History of chicken pox   . H/O measles   . H/O mumps    Past Surgical History  Procedure Laterality Date  . Cervical spine surgery    . Cholecystectomy  03/2005    by Dr. Dalbert Batman  .  Total abdominal hysterectomy w/ bilateral salpingoophorectomy    . Colonoscopy  02/05/2007,02/05/05    Dr. Silvano Rusk  . Esophagogastroduodenoscopy  02/05/2005,01/22/90    Dr. Silvano Rusk, Dr. Rachelle Hora  . Pacemaker implanted  05/11/97,08/02/05    [facility]  . Appendectomy  1971  . Tonsillectomy    . Cardiac catheterization  05/10/1997    Normal coronaries, MVP  . Nm myocar perf wall motion  08/24/2010    Normal  . Pacemaker generator change N/A 02/25/2012    Procedure: PACEMAKER GENERATOR CHANGE;  Surgeon: Sanda Klein, MD;  Location: [facility];  Service: Cardiovascular;  Laterality: N/A;   Family History  Problem Relation Age of Onset  . Pancreatic cancer Mother   . Diabetes Mother 70    type 2  . Stomach cancer Maternal Aunt   . Heart attack Brother   . Diabetes Brother     type 2  .  Gallbladder disease Father   . Diabetes Father     type 2  . Diabetes Brother     X 2   . Cancer Sister 80    skin ca on face  . Diabetes Sister     type 2  . Colon cancer    . Diabetes Daughter     type 2  . Hypertension Daughter   . Alcohol abuse Daughter   . Diabetes Son     type 2  . Diabetes Sister     type 1  . Hypertension Sister    History  Sexual Activity  . Sexual Activity: Not on file    Outpatient Encounter Prescriptions as of 04/29/2016  Medication Sig  . amiodarone (PACERONE) 200 MG tablet Take 0.5 tablets (100 mg total) by mouth daily.  Marland Kitchen atenolol (TENORMIN) 50 MG tablet TAKE ONE AND ONE-HALF TABLETS DAILY  . cholecalciferol (VITAMIN D) 1000 UNITS tablet Take 1,000 Units by mouth daily.  . furosemide (LASIX) 20 MG tablet TAKE 1 TABLET DAILY  . gabapentin (NEURONTIN) 300 MG capsule TAKE 1 CAPSULE TWICE A DAY  . levothyroxine (SYNTHROID, LEVOTHROID) 137 MCG tablet TAKE 1 TABLET DAILY  . Multiple Vitamin (MULTIVITAMIN WITH MINERALS) TABS tablet Take 1 tablet by mouth daily.  Marland Kitchen MYRBETRIQ 50 MG TB24 tablet Take 1 tablet by mouth daily.  Marland Kitchen omeprazole (PRILOSEC) 40 MG capsule Take 1 capsule (40 mg total) by mouth daily.  . polyvinyl alcohol (ARTIFICIAL TEARS) 1.4 % ophthalmic solution Place 1 drop into both eyes 3 (three) times daily as needed for dry eyes.  . pravastatin (PRAVACHOL) 40 MG tablet TAKE 1 TABLET DAILY  . traMADol (ULTRAM) 50 MG tablet Take 1 tablet (50 mg total) by mouth daily as needed for severe pain.  Marland Kitchen trimethoprim (TRIMPEX) 100 MG tablet Take 100 mg by mouth daily.  . valsartan-hydrochlorothiazide (DIOVAN-HCT) 80-12.5 MG tablet TAKE 1 TABLET DAILY  . vitamin B-12 (CYANOCOBALAMIN) 500 MCG tablet Take 500 mcg by mouth daily.  Marland Kitchen warfarin (COUMADIN) 2 MG tablet TAKE 1 TABLET DAILY OR AS DIRECTED  . [DISCONTINUED] cefdinir (OMNICEF) 300 MG capsule Take 1 capsule (300 mg total) by mouth 2 (two) times daily. (Patient not taking: Reported on 04/29/2016)    No facility-administered encounter medications on file as of 04/29/2016.    Activities of Daily Living In your present state of health, do you have any difficulty performing the following activities: 04/29/2016 08/01/2015  Hearing? Tempie Donning  Vision? N N  Difficulty concentrating or making decisions? N N  Walking or climbing stairs? N  Y  Dressing or bathing? N N  Doing errands, shopping? N N  Preparing Food and eating ? N -  Using the Toilet? N -  In the past six months, have you accidently leaked urine? Y -  Do you have problems with loss of bowel control? N -  Managing your Medications? N -  Managing your Finances? N -  Housekeeping or managing your Housekeeping? N -    Patient Care Team: Mosie Lukes, MD as PCP - General (Family Medicine) Sanda Klein, MD as Consulting Physician (Cardiology) Lynnell Dike, OD as Consulting Physician (Optometry)    Assessment:    Physical assessment deferred to PCP.  Exercise Activities and Dietary recommendations Current Exercise Habits: The patient does not participate in regular exercise at present, Exercise limited by: cardiac condition(s);orthopedic condition(s)   Diet:  Pt prepared her own food. Eats 3 meals daily. Likes fruit, tomatoes, and meatloaf. Endorses eating 'too much' white bread and white potatoes because they are one of her favorite foods. Dietary counseling provided, whole grain alternatives encouraged. Drinks 1-2 bottles of water daily and several glasses of Crystal Light Peach Tea, no caffeinated drinks.    Goals    . Patient Stated     Decrease knee pain and get back to regular activities.      Fall Risk Fall Risk  04/29/2016 04/28/2015 08/11/2013  Falls in the past year? Yes Yes No  Number falls in past yr: 2 or more 2 or more -  Injury with Fall? Yes No -  Risk Factor Category  High Fall Risk - -  Risk for fall due to : History of fall(s);Impaired balance/gait - -   Depression Screen PHQ 2/9 Scores 04/29/2016  04/28/2015 02/09/2013  PHQ - 2 Score 1 0 0     Cognitive Testing MMSE - Mini Mental State Exam 04/29/2016  Orientation to time 5  Orientation to Place 5  Registration 3  Attention/ Calculation 5  Recall 3  Language- name 2 objects 2  Language- repeat 1  Language- follow 3 step command 3  Language- read & follow direction 1  Write a sentence 1  Copy design 1  Total score 30    Immunization History  Administered Date(s) Administered  . Influenza Split 07/17/2011, 07/31/2012, 07/28/2013  . Influenza Whole 10/17/2009, 08/27/2010  . Influenza,inj,Quad PF,36+ Mos 08/08/2015  . Pneumococcal Conjugate-13 10/30/2015  . Pneumococcal Polysaccharide-23 09/30/2007  . Tdap 10/30/2015  . Zoster 04/07/2014   Screening Tests Health Maintenance  Topic Date Due  . INFLUENZA VACCINE  05/14/2016  . TETANUS/TDAP  10/29/2025  . DEXA SCAN  Completed  . ZOSTAVAX  Completed  . PNA vac Low Risk Adult  Completed      Plan:   We'll call you with your audiology appointment. Start using your walker when you are out with family members. Consider PT and let us know if you would like to proceed when you see Dr. Charlett Blake next week. Continue to eat heart healthy diet (full of fruits, vegetables, whole grains, lean protein, water--limit salt, fat, and sugar intake) and increase physical activity as tolerated. Continue doing brain stimulating activities (puzzles, reading, adult coloring books, staying active) to keep memory sharp.   During the course of the visit the patient was educated and counseled about the following appropriate screening and preventive services:   Vaccines to include Pneumoccal, Influenza, Hepatitis B, Td, Zostavax, HCV  Electrocardiogram  Cardiovascular Disease  Colorectal cancer screening  Bone density screening  Diabetes screening  Glaucoma screening  Mammography/PAP  Nutrition counseling   Patient Instructions (the written plan) was given to the patient.   Dorrene German, RN  04/29/2016

## 2016-04-29 NOTE — Patient Instructions (Addendum)
Jill Washington , Thank you for taking time to come for your Medicare Wellness Visit. I appreciate your ongoing commitment to your health goals. Please review the following plan we discussed and let me know if I can assist you in the future.   These are the goals we discussed: Goals    . Patient Stated     Decrease knee pain and get back to regular activities.       This is a list of the screening recommended for you and due dates:  Health Maintenance  Topic Date Due  . Flu Shot  05/14/2016  . Tetanus Vaccine  10/29/2025  . DEXA scan (bone density measurement)  Completed  . Shingles Vaccine  Completed  . Pneumonia vaccines  Completed     We'll call you with your audiology appointment. Start using your walker when you are out with family members. Consider PT and let us know if you would like to proceed when you see Dr. Charlett Blake next week. Continue to eat heart healthy diet (full of fruits, vegetables, whole grains, lean protein, water--limit salt, fat, and sugar intake) and increase physical activity as tolerated. Continue doing brain stimulating activities (puzzles, reading, adult coloring books, staying active) to keep memory sharp.   Fall Prevention in the Home  Falls can cause injuries. They can happen to people of all ages. There are many things you can do to make your home safe and to help prevent falls.  WHAT CAN I DO ON THE OUTSIDE OF MY HOME?  Regularly fix the edges of walkways and driveways and fix any cracks.  Remove anything that might make you trip as you walk through a door, such as a raised step or threshold.  Trim any bushes or trees on the path to your home.  Use bright outdoor lighting.  Clear any walking paths of anything that might make someone trip, such as rocks or tools.  Regularly check to see if handrails are loose or broken. Make sure that both sides of any steps have handrails.  Any raised decks and porches should have guardrails on the edges.  Have  any leaves, snow, or ice cleared regularly.  Use sand or salt on walking paths during winter.  Clean up any spills in your garage right away. This includes oil or grease spills. WHAT CAN I DO IN THE BATHROOM?   Use night lights.  Install grab bars by the toilet and in the tub and shower. Do not use towel bars as grab bars.  Use non-skid mats or decals in the tub or shower.  If you need to sit down in the shower, use a plastic, non-slip stool.  Keep the floor dry. Clean up any water that spills on the floor as soon as it happens.  Remove soap buildup in the tub or shower regularly.  Attach bath mats securely with double-sided non-slip rug tape.  Do not have throw rugs and other things on the floor that can make you trip. WHAT CAN I DO IN THE BEDROOM?  Use night lights.  Make sure that you have a light by your bed that is easy to reach.  Do not use any sheets or blankets that are too big for your bed. They should not hang down onto the floor.  Have a firm chair that has side arms. You can use this for support while you get dressed.  Do not have throw rugs and other things on the floor that can make you trip. WHAT  CAN I DO IN THE KITCHEN?  Clean up any spills right away.  Avoid walking on wet floors.  Keep items that you use a lot in easy-to-reach places.  If you need to reach something above you, use a strong step stool that has a grab bar.  Keep electrical cords out of the way.  Do not use floor polish or wax that makes floors slippery. If you must use wax, use non-skid floor wax.  Do not have throw rugs and other things on the floor that can make you trip. WHAT CAN I DO WITH MY STAIRS?  Do not leave any items on the stairs.  Make sure that there are handrails on both sides of the stairs and use them. Fix handrails that are broken or loose. Make sure that handrails are as long as the stairways.  Check any carpeting to make sure that it is firmly attached to the  stairs. Fix any carpet that is loose or worn.  Avoid having throw rugs at the top or bottom of the stairs. If you do have throw rugs, attach them to the floor with carpet tape.  Make sure that you have a light switch at the top of the stairs and the bottom of the stairs. If you do not have them, ask someone to add them for you. WHAT ELSE CAN I DO TO HELP PREVENT FALLS?  Wear shoes that:  Do not have high heels.  Have rubber bottoms.  Are comfortable and fit you well.  Are closed at the toe. Do not wear sandals.  If you use a stepladder:  Make sure that it is fully opened. Do not climb a closed stepladder.  Make sure that both sides of the stepladder are locked into place.  Ask someone to hold it for you, if possible.  Clearly mark and make sure that you can see:  Any grab bars or handrails.  First and last steps.  Where the edge of each step is.  Use tools that help you move around (mobility aids) if they are needed. These include:  Canes.  Walkers.  Scooters.  Crutches.  Turn on the lights when you go into a dark area. Replace any light bulbs as soon as they burn out.  Set up your furniture so you have a clear path. Avoid moving your furniture around.  If any of your floors are uneven, fix them.  If there are any pets around you, be aware of where they are.  Review your medicines with your doctor. Some medicines can make you feel dizzy. This can increase your chance of falling. Ask your doctor what other things that you can do to help prevent falls.   This information is not intended to replace advice given to you by your health care provider. Make sure you discuss any questions you have with your health care provider.   Document Released: 07/27/2009 Document Revised: 02/14/2015 Document Reviewed: 11/04/2014 Elsevier Interactive Patient Education [employer].

## 2016-04-29 NOTE — Progress Notes (Signed)
Pre visit review using our clinic review tool, if applicable. No additional management support is needed unless otherwise documented below in the visit note. 

## 2016-04-29 NOTE — Progress Notes (Signed)
Reviewed in PCP absence. Plan is as written in RN note.

## 2016-04-29 NOTE — Telephone Encounter (Signed)
Rx phoned to pharmacy.  

## 2016-05-01 NOTE — Progress Notes (Signed)
RN note reviewed. Agree with documention and plan. 

## 2016-05-07 ENCOUNTER — Ambulatory Visit: Payer: Medicare Other | Admitting: Family Medicine

## 2016-05-15 ENCOUNTER — Ambulatory Visit (INDEPENDENT_AMBULATORY_CARE_PROVIDER_SITE_OTHER): Payer: Medicare Other | Admitting: Pharmacist Clinician (PhC)/ Clinical Pharmacy Specialist

## 2016-05-15 DIAGNOSIS — Z7901 Long term (current) use of anticoagulants: Secondary | ICD-10-CM

## 2016-05-15 DIAGNOSIS — I4891 Unspecified atrial fibrillation: Secondary | ICD-10-CM

## 2016-05-15 LAB — POCT INR: INR: 3.6

## 2016-05-23 ENCOUNTER — Ambulatory Visit (INDEPENDENT_AMBULATORY_CARE_PROVIDER_SITE_OTHER): Payer: Medicare Other | Admitting: *Deleted

## 2016-05-23 DIAGNOSIS — I495 Sick sinus syndrome: Secondary | ICD-10-CM | POA: Diagnosis not present

## 2016-05-23 NOTE — Progress Notes (Signed)
Remote pacemaker transmission.   

## 2016-05-28 ENCOUNTER — Encounter: Payer: Self-pay | Admitting: Family Medicine

## 2016-05-28 ENCOUNTER — Ambulatory Visit (INDEPENDENT_AMBULATORY_CARE_PROVIDER_SITE_OTHER): Payer: Medicare Other | Admitting: Family Medicine

## 2016-05-28 ENCOUNTER — Ambulatory Visit (HOSPITAL_BASED_OUTPATIENT_CLINIC_OR_DEPARTMENT_OTHER)
Admission: RE | Admit: 2016-05-28 | Discharge: 2016-05-28 | Disposition: A | Discharge status: Home / Self Care | Payer: Medicare Other | Source: Ambulatory Visit | Attending: Family Medicine | Admitting: Family Medicine

## 2016-05-28 ENCOUNTER — Encounter: Payer: Self-pay | Admitting: Cardiology

## 2016-05-28 VITALS — BP 122/60 | HR 71 | Temp 98.3°F | Resp 17 | Ht 66.0 in | Wt 195.0 lb

## 2016-05-28 DIAGNOSIS — E7800 Pure hypercholesterolemia, unspecified: Secondary | ICD-10-CM | POA: Diagnosis not present

## 2016-05-28 DIAGNOSIS — E559 Vitamin D deficiency, unspecified: Secondary | ICD-10-CM

## 2016-05-28 DIAGNOSIS — M25561 Pain in right knee: Secondary | ICD-10-CM | POA: Diagnosis not present

## 2016-05-28 DIAGNOSIS — M5441 Lumbago with sciatica, right side: Secondary | ICD-10-CM

## 2016-05-28 DIAGNOSIS — M170 Bilateral primary osteoarthritis of knee: Secondary | ICD-10-CM | POA: Insufficient documentation

## 2016-05-28 DIAGNOSIS — I442 Atrioventricular block, complete: Secondary | ICD-10-CM

## 2016-05-28 DIAGNOSIS — M11261 Other chondrocalcinosis, right knee: Secondary | ICD-10-CM | POA: Diagnosis not present

## 2016-05-28 DIAGNOSIS — I10 Essential (primary) hypertension: Secondary | ICD-10-CM | POA: Diagnosis not present

## 2016-05-28 DIAGNOSIS — M25562 Pain in left knee: Secondary | ICD-10-CM | POA: Insufficient documentation

## 2016-05-28 DIAGNOSIS — M11262 Other chondrocalcinosis, left knee: Secondary | ICD-10-CM | POA: Insufficient documentation

## 2016-05-28 DIAGNOSIS — D509 Iron deficiency anemia, unspecified: Secondary | ICD-10-CM

## 2016-05-28 DIAGNOSIS — E538 Deficiency of other specified B group vitamins: Secondary | ICD-10-CM

## 2016-05-28 DIAGNOSIS — I480 Paroxysmal atrial fibrillation: Secondary | ICD-10-CM

## 2016-05-28 DIAGNOSIS — M179 Osteoarthritis of knee, unspecified: Secondary | ICD-10-CM | POA: Diagnosis not present

## 2016-05-28 DIAGNOSIS — M2341 Loose body in knee, right knee: Secondary | ICD-10-CM | POA: Diagnosis not present

## 2016-05-28 DIAGNOSIS — E039 Hypothyroidism, unspecified: Secondary | ICD-10-CM

## 2016-05-28 NOTE — Patient Instructions (Signed)
Hypertension Hypertension, commonly called high blood pressure, is when the force of blood pumping through your arteries is too strong. Your arteries are the blood vessels that carry blood from your heart throughout your body. A blood pressure reading consists of a higher number over a lower number, such as 110/72. The higher number (systolic) is the pressure inside your arteries when your heart pumps. The lower number (diastolic) is the pressure inside your arteries when your heart relaxes. Ideally you want your blood pressure below 120/80. Hypertension forces your heart to work harder to pump blood. Your arteries may become narrow or stiff. Having untreated or uncontrolled hypertension can cause heart attack, stroke, kidney disease, and other problems. RISK FACTORS Some risk factors for high blood pressure are controllable. Others are not.  Risk factors you cannot control include:   Race. You may be at higher risk if you are African American.  Age. Risk increases with age.  Gender. Men are at higher risk than women before age 45 years. After age 65, women are at higher risk than men. Risk factors you can control include:  Not getting enough exercise or physical activity.  Being overweight.  Getting too much fat, sugar, calories, or salt in your diet.  Drinking too much alcohol. SIGNS AND SYMPTOMS Hypertension does not usually cause signs or symptoms. Extremely high blood pressure (hypertensive crisis) may cause headache, anxiety, shortness of breath, and nosebleed. DIAGNOSIS To check if you have hypertension, your health care provider will measure your blood pressure while you are seated, with your arm held at the level of your heart. It should be measured at least twice using the same arm. Certain conditions can cause a difference in blood pressure between your right and left arms. A blood pressure reading that is higher than normal on one occasion does not mean that you need treatment. If  it is not clear whether you have high blood pressure, you may be asked to return on a different day to have your blood pressure checked again. Or, you may be asked to monitor your blood pressure at home for 1 or more weeks. TREATMENT Treating high blood pressure includes making lifestyle changes and possibly taking medicine. Living a healthy lifestyle can help lower high blood pressure. You may need to change some of your habits. Lifestyle changes may include:  Following the DASH diet. This diet is high in fruits, vegetables, and whole grains. It is low in salt, red meat, and added sugars.  Keep your sodium intake below 2,300 mg per day.  Getting at least 30-45 minutes of aerobic exercise at least 4 times per week.  Losing weight if necessary.  Not smoking.  Limiting alcoholic beverages.  Learning ways to reduce stress. Your health care provider may prescribe medicine if lifestyle changes are not enough to get your blood pressure under control, and if one of the following is true:  You are 18-59 years of age and your systolic blood pressure is above 140.  You are 60 years of age or older, and your systolic blood pressure is above 150.  Your diastolic blood pressure is above 90.  You have diabetes, and your systolic blood pressure is over 140 or your diastolic blood pressure is over 90.  You have kidney disease and your blood pressure is above 140/90.  You have heart disease and your blood pressure is above 140/90. Your personal target blood pressure may vary depending on your medical conditions, your age, and other factors. HOME CARE INSTRUCTIONS    Have your blood pressure rechecked as directed by your health care provider.   Take medicines only as directed by your health care provider. Follow the directions carefully. Blood pressure medicines must be taken as prescribed. The medicine does not work as well when you skip doses. Skipping doses also puts you at risk for  problems.  Do not smoke.   Monitor your blood pressure at home as directed by your health care provider. SEEK MEDICAL CARE IF:   You think you are having a reaction to medicines taken.  You have recurrent headaches or feel dizzy.  You have swelling in your ankles.  You have trouble with your vision. SEEK IMMEDIATE MEDICAL CARE IF:  You develop a severe headache or confusion.  You have unusual weakness, numbness, or feel faint.  You have severe chest or abdominal pain.  You vomit repeatedly.  You have trouble breathing. MAKE SURE YOU:   Understand these instructions.  Will watch your condition.  Will get help right away if you are not doing well or get worse.   This information is not intended to replace advice given to you by your health care provider. Make sure you discuss any questions you have with your health care provider.   Document Released: 09/30/2005 Document Revised: 02/14/2015 Document Reviewed: 07/23/2013 Elsevier Interactive Patient Education 2016 Elsevier Inc.  

## 2016-05-28 NOTE — Progress Notes (Signed)
Pre visit review using our clinic review tool, if applicable. No additional management support is needed unless otherwise documented below in the visit note. 

## 2016-05-29 ENCOUNTER — Other Ambulatory Visit: Payer: Self-pay | Admitting: Family Medicine

## 2016-05-29 DIAGNOSIS — M25561 Pain in right knee: Secondary | ICD-10-CM

## 2016-05-29 DIAGNOSIS — M25562 Pain in left knee: Principal | ICD-10-CM

## 2016-05-29 LAB — CUP PACEART REMOTE DEVICE CHECK
Battery Remaining Percentage: 73 %
Battery Voltage: 2.92 V
Brady Statistic AP VS Percent: 1 %
Brady Statistic AS VP Percent: 1 %
Brady Statistic AS VS Percent: 1 %
Implantable Lead Implant Date: 19980729
Implantable Lead Location: 753860
Lead Channel Pacing Threshold Amplitude: 0.5 V
Lead Channel Pacing Threshold Amplitude: 1 V
Lead Channel Pacing Threshold Pulse Width: 0.4 ms
Lead Channel Sensing Intrinsic Amplitude: 12 mV
Lead Channel Sensing Intrinsic Amplitude: 2.7 mV
Lead Channel Setting Pacing Amplitude: 1.25 V
Lead Channel Setting Pacing Pulse Width: 0.4 ms
MDC IDC LEAD IMPLANT DT: 19980729
MDC IDC LEAD LOCATION: 753859
MDC IDC MSMT BATTERY REMAINING LONGEVITY: 85 mo
MDC IDC MSMT LEADCHNL RA IMPEDANCE VALUE: 400 Ohm
MDC IDC MSMT LEADCHNL RA PACING THRESHOLD PULSEWIDTH: 0.4 ms
MDC IDC MSMT LEADCHNL RV IMPEDANCE VALUE: 490 Ohm
MDC IDC PG SERIAL: 7338831
MDC IDC SESS DTM: 20170810072859
MDC IDC SET LEADCHNL RA PACING AMPLITUDE: 1.5 V
MDC IDC SET LEADCHNL RV SENSING SENSITIVITY: 4 mV
MDC IDC STAT BRADY AP VP PERCENT: 99 %
MDC IDC STAT BRADY RA PERCENT PACED: 99 %
MDC IDC STAT BRADY RV PERCENT PACED: 99 %

## 2016-05-29 LAB — COMPREHENSIVE METABOLIC PANEL
ALBUMIN: 4.1 g/dL (ref 3.5–5.2)
ALK PHOS: 51 U/L (ref 39–117)
ALT: 12 U/L (ref 0–35)
AST: 17 U/L (ref 0–37)
BUN: 25 mg/dL — AB (ref 6–23)
CALCIUM: 9.8 mg/dL (ref 8.4–10.5)
CO2: 28 mEq/L (ref 19–32)
Chloride: 107 mEq/L (ref 96–112)
Creatinine, Ser: 1.89 mg/dL — ABNORMAL HIGH (ref 0.40–1.20)
GFR: 26.68 mL/min — AB (ref >60.00)
Glucose, Bld: 86 mg/dL (ref 70–99)
POTASSIUM: 5.4 meq/L — AB (ref 3.5–5.1)
Sodium: 140 mEq/L (ref 135–145)
TOTAL PROTEIN: 7.1 g/dL (ref 6.0–8.3)
Total Bilirubin: 0.3 mg/dL (ref 0.2–1.2)

## 2016-05-29 LAB — LIPID PANEL
CHOLESTEROL: 163 mg/dL (ref 0–200)
HDL: 47.1 mg/dL (ref >39.00)
LDL Cholesterol: 76 mg/dL (ref 0–99)
NonHDL: 115.52
TRIGLYCERIDES: 196 mg/dL — AB (ref 0.0–149.0)
Total CHOL/HDL Ratio: 3
VLDL: 39.2 mg/dL (ref 0.0–40.0)

## 2016-05-29 LAB — CBC
HCT: 35.9 % — ABNORMAL LOW (ref 36.0–46.0)
Hemoglobin: 12.1 g/dL (ref 12.0–15.0)
MCHC: 33.8 g/dL (ref 30.0–36.0)
MCV: 91.8 fl (ref 78.0–100.0)
PLATELETS: 208 10*3/uL (ref 150.0–400.0)
RBC: 3.91 Mil/uL (ref 3.87–5.11)
RDW: 12.9 % (ref 11.5–15.5)
WBC: 7.4 10*3/uL (ref 4.0–10.5)

## 2016-05-29 LAB — VITAMIN B12: VITAMIN B 12: 1266 pg/mL — AB (ref 211–911)

## 2016-05-29 LAB — TSH: TSH: 2.23 u[IU]/mL (ref 0.35–4.50)

## 2016-05-29 LAB — VITAMIN D 25 HYDROXY (VIT D DEFICIENCY, FRACTURES): VITD: 31.6 ng/mL (ref 30.00–100.00)

## 2016-05-30 ENCOUNTER — Other Ambulatory Visit: Payer: Self-pay | Admitting: Family Medicine

## 2016-05-30 DIAGNOSIS — E876 Hypokalemia: Secondary | ICD-10-CM

## 2016-05-31 ENCOUNTER — Encounter: Payer: Self-pay | Admitting: Family Medicine

## 2016-05-31 ENCOUNTER — Telehealth: Payer: Self-pay | Admitting: Family Medicine

## 2016-05-31 DIAGNOSIS — M25562 Pain in left knee: Principal | ICD-10-CM

## 2016-05-31 DIAGNOSIS — M25561 Pain in right knee: Secondary | ICD-10-CM | POA: Insufficient documentation

## 2016-05-31 MED ORDER — TRAMADOL HCL 50 MG PO TABS: 50.0000 mg | ORAL_TABLET | Freq: Every day | ORAL | 0 refills | Status: DC | PRN

## 2016-05-31 NOTE — Telephone Encounter (Signed)
°  Relationship to patient: Self Can be reached: [facility], [place] - [address] [phone] (Phone) [phone] (Fax)     Reason for call: Request refill on traMADol (ULTRAM) 50 MG tablet [id number]

## 2016-05-31 NOTE — Assessment & Plan Note (Signed)
Rate controlled, tolerating Coumadin 

## 2016-05-31 NOTE — Assessment & Plan Note (Signed)
Struggling with worsening pain and debility xrays confirm advanced degenerative changes. Patient referred to [facility] ortho for further consideration of options. May use topical treatments while waiting

## 2016-05-31 NOTE — Assessment & Plan Note (Signed)
Tolerating statin, encouraged heart healthy diet, avoid trans fats, minimize simple carbs and saturated fats. Increase exercise as tolerated 

## 2016-05-31 NOTE — Assessment & Plan Note (Signed)
On Levothyroxine, continue to monitor 

## 2016-05-31 NOTE — Progress Notes (Signed)
Patient ID: CHRISLEY ABBAS, female   DOB: [birth year], 82 y.o.   MRN: [id number]   Subjective:    Patient ID: [name], female    DOB: 1926/05/19, 82 y.o.   MRN: [id number]  Chief Complaint  Patient presents with  . Follow-up    patient report having bilaterial leg pain, the right moren than left.    HPI Patient is in today for follow up. Continues to struggle with b/l knee pain, uses Tramadol with some relief but the level of pain continues to escalate and she is ready to see ortho and discuss her options. No recent new trauma or injury. Reports some swelling but no erythema or warmth. No other acute concerns. Denies CP/palp/SOB/HA/congestion/fevers/GI or GU c/o. Taking meds as prescribed  Past Medical History:  Diagnosis Date  . Arthritis   . Blood transfusion   . CHB (complete heart block) ([place]) 11/10/2014  . Chicken pox as a child  . Colon polyps    hyperplastic  . Critical illness polyneuropathy ([place])   . Diverticulosis of sigmoid colon 02/05/2007   mild  . Fecal incontinence   . Fibromyalgia   . GERD (gastroesophageal reflux disease)   . H/O measles   . H/O mumps   . Hiatal hernia   . History of atrial fibrillation   . History of chicken pox   . History of empyema of pleura   . History of gallstones   . History of skin cancer    face  . Hypercholesteremia   . Hypertension   . Hypothyroidism   . IBS (irritable bowel syndrome)   . Measles as a child  . Medicare annual wellness visit, subsequent 05/15/2015   Allergies verified: UTD   Immunization Status: Flu vaccine-- NA  Tdap-- 9-10 years ago per patient  PNA-- 09/30/07 (23)  Shingles-- 04/07/14   A/P:  Changes to [place], [place] or Personal Hx: UTD Pap-- none recent  MMG--declines further MMgs (last 08/12/03 BI-RADS 1: Neg)  Bone Density-- 08/11/03 with Prescott Gum, MD- Normal, declines further testing CCS-- 05/07/12 with Silvano Rusk, MD- polyp removed- no   . Mitral valve prolapse   . Mumps as a child  . Overactive  bladder 05/07/2011  . Pacemaker   . Peripheral neuropathy ([place])   . Shingles 8 yrs ago  . Sick sinus syndrome ([place])   . Urinary incontinence   . Venous insufficiency   . Vitamin D deficiency     Past Surgical History:  Procedure Laterality Date  . APPENDECTOMY  1971  . CARDIAC CATHETERIZATION  05/10/1997   Normal coronaries, MVP  . CERVICAL SPINE SURGERY    . CHOLECYSTECTOMY  03/2005   by Dr. Dalbert Batman  . COLONOSCOPY  02/05/2007,02/05/05   Dr. Silvano Rusk  . ESOPHAGOGASTRODUODENOSCOPY  02/05/2005,01/22/90   Dr. Silvano Rusk, Dr. Rachelle Hora  . NM MYOCAR PERF WALL MOTION  08/24/2010   Normal  . PACEMAKER GENERATOR CHANGE N/A 02/25/2012   Procedure: PACEMAKER GENERATOR CHANGE;  Surgeon: Sanda Klein, MD;  Location: [facility] CATH LAB;  Service: Cardiovascular;  Laterality: N/A;  . pacemaker implanted  05/11/97,08/02/05   [facility]  . TONSILLECTOMY    . TOTAL ABDOMINAL HYSTERECTOMY W/ BILATERAL SALPINGOOPHORECTOMY      Family History  Problem Relation Age of Onset  . Pancreatic cancer Mother   . Diabetes Mother 20    type 2  . Stomach cancer Maternal Aunt   . Heart attack Brother   . Diabetes Brother     type 2  .  Gallbladder disease Father   . Diabetes Father     type 2  . Diabetes Brother     X 2   . Cancer Sister 80    skin ca on face  . Diabetes Sister     type 2  . Colon cancer    . Diabetes Daughter     type 2  . Hypertension Daughter   . Alcohol abuse Daughter   . Diabetes Son     type 2  . Diabetes Sister     type 1  . Hypertension Sister     Social History   Social History  . Marital status: Widowed    Spouse name: N/A  . Number of children: 3  . Years of education: N/A   Occupational History  . retired Retired   Social History Main Topics  . Smoking status: Former Smoker    Packs/day: 2.00    Years: 6.00    Types: Cigarettes    Quit date: 10/15/1951  . Smokeless tobacco: Never Used  . Alcohol use No  . Drug use: No  . Sexual activity: Not on  file   Other Topics Concern  . Not on file   Social History Narrative   She is a widow, one son to daughters. She is retired.    Outpatient Medications Prior to Visit  Medication Sig Dispense Refill  . amiodarone (PACERONE) 200 MG tablet Take 0.5 tablets (100 mg total) by mouth daily. 45 tablet 3  . atenolol (TENORMIN) 50 MG tablet TAKE ONE AND ONE-HALF TABLETS DAILY 135 tablet 2  . cholecalciferol (VITAMIN D) 1000 UNITS tablet Take 1,000 Units by mouth daily.    . furosemide (LASIX) 20 MG tablet TAKE 1 TABLET DAILY 90 tablet 0  . gabapentin (NEURONTIN) 300 MG capsule TAKE 1 CAPSULE TWICE A DAY 180 capsule 2  . levothyroxine (SYNTHROID, LEVOTHROID) 137 MCG tablet TAKE 1 TABLET DAILY 90 tablet 2  . Multiple Vitamin (MULTIVITAMIN WITH MINERALS) TABS tablet Take 1 tablet by mouth daily.    Marland Kitchen MYRBETRIQ 50 MG TB24 tablet Take 1 tablet by mouth daily.    Marland Kitchen omeprazole (PRILOSEC) 40 MG capsule Take 1 capsule (40 mg total) by mouth daily. 90 capsule 1  . polyvinyl alcohol (ARTIFICIAL TEARS) 1.4 % ophthalmic solution Place 1 drop into both eyes 3 (three) times daily as needed for dry eyes.    . pravastatin (PRAVACHOL) 40 MG tablet TAKE 1 TABLET DAILY 90 tablet 1  . traMADol (ULTRAM) 50 MG tablet Take 1 tablet (50 mg total) by mouth daily as needed for severe pain. 20 tablet 0  . trimethoprim (TRIMPEX) 100 MG tablet Take 100 mg by mouth daily.    . valsartan-hydrochlorothiazide (DIOVAN-HCT) 80-12.5 MG tablet TAKE 1 TABLET DAILY 90 tablet 2  . warfarin (COUMADIN) 2 MG tablet TAKE 1 TABLET DAILY OR AS DIRECTED 90 tablet 0   No facility-administered medications prior to visit.     Allergies  Allergen Reactions  . Cymbalta [Duloxetine Hcl] Other (See Comments)    Could not think straight  . Iodinated Diagnostic Agents Other (See Comments)    Pt was not told what the reaction was  . Other Itching    Dial Soap    Review of Systems  Constitutional: Negative for fever and malaise/fatigue.    HENT: Negative for congestion.   Eyes: Negative for blurred vision.  Respiratory: Negative for shortness of breath.   Cardiovascular: Negative for chest pain, palpitations and leg swelling.  Gastrointestinal: Negative for abdominal pain, blood in stool and nausea.  Genitourinary: Negative for dysuria and frequency.  Musculoskeletal: Positive for joint pain. Negative for falls.  Skin: Negative for rash.  Neurological: Negative for dizziness, loss of consciousness and headaches.  Endo/Heme/Allergies: Negative for environmental allergies.  Psychiatric/Behavioral: Negative for depression. The patient is not nervous/anxious.        Objective:    Physical Exam  Constitutional: She is oriented to person, place, and time. She appears well-developed and well-nourished. No distress.  HENT:  Head: Normocephalic and atraumatic.  Nose: Nose normal.  Eyes: Right eye exhibits no discharge. Left eye exhibits no discharge.  Neck: Normal range of motion. Neck supple.  Cardiovascular: Normal rate.   No murmur heard. irregular  Pulmonary/Chest: Effort normal and breath sounds normal.  Abdominal: Soft. Bowel sounds are normal. There is no tenderness.  Musculoskeletal: She exhibits edema.  Slight swelling laterally over right knee. No warmth or erythema  Neurological: She is alert and oriented to person, place, and time.  Skin: Skin is warm and dry.  Psychiatric: She has a normal mood and affect.  Nursing note and vitals reviewed.   BP 122/60 (BP Location: Left Arm, Patient Position: Sitting, Cuff Size: Large)   Pulse 71   Temp 98.3 F (36.8 C) (Oral)   Resp 17   Ht 5\' 6"  (1.676 m)   Wt 195 lb (88.5 kg)   SpO2 96%   BMI 31.47 kg/m  Wt Readings from Last 3 Encounters:  05/28/16 195 lb (88.5 kg)  04/29/16 193 lb 6.4 oz (87.7 kg)  03/01/16 195 lb (88.5 kg)     Lab Results  Component Value Date   WBC 7.4 05/28/2016   HGB 12.1 05/28/2016   HCT 35.9 (L) 05/28/2016   PLT 208.0  05/28/2016   GLUCOSE 86 05/28/2016   CHOL 163 05/28/2016   TRIG 196.0 (H) 05/28/2016   HDL 47.10 05/28/2016   LDLDIRECT 77.0 04/28/2015   LDLCALC 76 05/28/2016   ALT 12 05/28/2016   AST 17 05/28/2016   NA 140 05/28/2016   K 5.4 (H) 05/28/2016   CL 107 05/28/2016   CREATININE 1.89 (H) 05/28/2016   BUN 25 (H) 05/28/2016   CO2 28 05/28/2016   TSH 2.23 05/28/2016   INR 3.6 05/15/2016   HGBA1C 5.3 08/02/2015    Lab Results  Component Value Date   TSH 2.23 05/28/2016   Lab Results  Component Value Date   WBC 7.4 05/28/2016   HGB 12.1 05/28/2016   HCT 35.9 (L) 05/28/2016   MCV 91.8 05/28/2016   PLT 208.0 05/28/2016   Lab Results  Component Value Date   NA 140 05/28/2016   K 5.4 (H) 05/28/2016   CO2 28 05/28/2016   GLUCOSE 86 05/28/2016   BUN 25 (H) 05/28/2016   CREATININE 1.89 (H) 05/28/2016   BILITOT 0.3 05/28/2016   ALKPHOS 51 05/28/2016   AST 17 05/28/2016   ALT 12 05/28/2016   PROT 7.1 05/28/2016   ALBUMIN 4.1 05/28/2016   CALCIUM 9.8 05/28/2016   ANIONGAP 14 08/09/2015   GFR 26.68 (L) 05/28/2016   Lab Results  Component Value Date   CHOL 163 05/28/2016   Lab Results  Component Value Date   HDL 47.10 05/28/2016   Lab Results  Component Value Date   LDLCALC 76 05/28/2016   Lab Results  Component Value Date   TRIG 196.0 (H) 05/28/2016   Lab Results  Component Value Date   CHOLHDL 3 05/28/2016  Lab Results  Component Value Date   HGBA1C 5.3 08/02/2015       Assessment & Plan:   Problem List Items Addressed This Visit    Hypothyroidism    On Levothyroxine, continue to monitor      Vitamin D deficiency   Relevant Orders   Vitamin D (25 hydroxy) (Completed)   HYPERCHOLESTEROLEMIA    Tolerating statin, encouraged heart healthy diet, avoid trans fats, minimize simple carbs and saturated fats. Increase exercise as tolerated      Relevant Orders   Lipid panel (Completed)   Essential hypertension    Well controlled, no changes to meds.  Encouraged heart healthy diet such as the DASH diet and exercise as tolerated.       Relevant Orders   CBC (Completed)   TSH (Completed)   Comprehensive metabolic panel (Completed)   Atrial fibrillation (HCC)    Rate controlled, tolerating Coumadin      B12 deficiency   Relevant Orders   Vitamin B12 (Completed)   Anemia   Low back pain   CHB (complete heart block) (HCC)   Knee pain, bilateral - Primary    Struggling with worsening pain and debility xrays confirm advanced degenerative changes. Patient referred to [facility] ortho for further consideration of options. May use topical treatments while waiting      Relevant Orders   DG Knee Complete 4 Views Right (Completed)   DG Knee Complete 4 Views Left (Completed)   Ambulatory referral to Orthopedic Surgery    Other Visit Diagnoses   None.     I am having Ms. Sherbert maintain her multivitamin with minerals, cholecalciferol, polyvinyl alcohol, trimethoprim, gabapentin, levothyroxine, pravastatin, MYRBETRIQ, amiodarone, atenolol, valsartan-hydrochlorothiazide, furosemide, omeprazole, warfarin, and traMADol.  No orders of the defined types were placed in this encounter.    [name], MD

## 2016-05-31 NOTE — Assessment & Plan Note (Signed)
Well controlled, no changes to meds. Encouraged heart healthy diet such as the DASH diet and exercise as tolerated.  °

## 2016-05-31 NOTE — Telephone Encounter (Signed)
OK to refill Tramadol 

## 2016-05-31 NOTE — Assessment & Plan Note (Deleted)
Well controlled, no changes to meds. Encouraged heart healthy diet such as the DASH diet and exercise as tolerated.  °

## 2016-05-31 NOTE — Telephone Encounter (Signed)
Last office visit 05/28/2016 Last refill on 04/29/2016  #20 with 0 refills

## 2016-05-31 NOTE — Telephone Encounter (Signed)
Printed and faxed to [facility].

## 2016-06-03 ENCOUNTER — Ambulatory Visit (INDEPENDENT_AMBULATORY_CARE_PROVIDER_SITE_OTHER): Payer: Medicare Other | Admitting: Pharmacist Clinician (PhC)/ Clinical Pharmacy Specialist

## 2016-06-03 DIAGNOSIS — I4891 Unspecified atrial fibrillation: Secondary | ICD-10-CM | POA: Diagnosis not present

## 2016-06-03 DIAGNOSIS — Z7901 Long term (current) use of anticoagulants: Secondary | ICD-10-CM | POA: Diagnosis not present

## 2016-06-03 LAB — POCT INR: INR: 2.1

## 2016-06-19 DIAGNOSIS — M25561 Pain in right knee: Secondary | ICD-10-CM | POA: Diagnosis not present

## 2016-06-19 DIAGNOSIS — M25562 Pain in left knee: Secondary | ICD-10-CM | POA: Diagnosis not present

## 2016-07-01 ENCOUNTER — Other Ambulatory Visit: Payer: Medicare Other

## 2016-07-01 DIAGNOSIS — N3946 Mixed incontinence: Secondary | ICD-10-CM | POA: Diagnosis not present

## 2016-07-01 DIAGNOSIS — N3020 Other chronic cystitis without hematuria: Secondary | ICD-10-CM | POA: Diagnosis not present

## 2016-07-02 ENCOUNTER — Ambulatory Visit (INDEPENDENT_AMBULATORY_CARE_PROVIDER_SITE_OTHER): Payer: Medicare Other | Admitting: Pharmacist

## 2016-07-02 DIAGNOSIS — Z7901 Long term (current) use of anticoagulants: Secondary | ICD-10-CM | POA: Diagnosis not present

## 2016-07-02 DIAGNOSIS — I4891 Unspecified atrial fibrillation: Secondary | ICD-10-CM | POA: Diagnosis not present

## 2016-07-02 LAB — POCT INR: INR: 2.7

## 2016-07-03 ENCOUNTER — Other Ambulatory Visit (INDEPENDENT_AMBULATORY_CARE_PROVIDER_SITE_OTHER): Payer: Medicare Other

## 2016-07-03 DIAGNOSIS — E876 Hypokalemia: Secondary | ICD-10-CM

## 2016-07-03 LAB — COMPREHENSIVE METABOLIC PANEL
ALBUMIN: 3.5 g/dL (ref 3.5–5.2)
ALT: 14 U/L (ref 0–35)
AST: 17 U/L (ref 0–37)
Alkaline Phosphatase: 58 U/L (ref 39–117)
BILIRUBIN TOTAL: 0.3 mg/dL (ref 0.2–1.2)
BUN: 28 mg/dL — AB (ref 6–23)
CALCIUM: 8.9 mg/dL (ref 8.4–10.5)
CHLORIDE: 110 meq/L (ref 96–112)
CO2: 26 mEq/L (ref 19–32)
CREATININE: 2 mg/dL — AB (ref 0.40–1.20)
GFR: 24.99 mL/min — ABNORMAL LOW (ref >60.00)
Glucose, Bld: 90 mg/dL (ref 70–99)
Potassium: 5 mEq/L (ref 3.5–5.1)
SODIUM: 140 meq/L (ref 135–145)
Total Protein: 6.4 g/dL (ref 6.0–8.3)

## 2016-07-04 ENCOUNTER — Other Ambulatory Visit: Payer: Self-pay | Admitting: Family Medicine

## 2016-07-04 DIAGNOSIS — N289 Disorder of kidney and ureter, unspecified: Secondary | ICD-10-CM

## 2016-07-04 DIAGNOSIS — Z23 Encounter for immunization: Secondary | ICD-10-CM | POA: Diagnosis not present

## 2016-07-08 ENCOUNTER — Other Ambulatory Visit: Payer: Self-pay | Admitting: Family Medicine

## 2016-07-21 ENCOUNTER — Other Ambulatory Visit: Payer: Self-pay | Admitting: Cardiovascular Disease

## 2016-07-24 ENCOUNTER — Ambulatory Visit: Payer: Medicare Other | Attending: Family Medicine | Admitting: Audiology

## 2016-07-24 DIAGNOSIS — Z822 Family history of deafness and hearing loss: Secondary | ICD-10-CM | POA: Insufficient documentation

## 2016-07-24 DIAGNOSIS — Z01118 Encounter for examination of ears and hearing with other abnormal findings: Secondary | ICD-10-CM | POA: Insufficient documentation

## 2016-07-24 DIAGNOSIS — R94128 Abnormal results of other function studies of ear and other special senses: Secondary | ICD-10-CM | POA: Insufficient documentation

## 2016-07-24 DIAGNOSIS — H93299 Other abnormal auditory perceptions, unspecified ear: Secondary | ICD-10-CM | POA: Insufficient documentation

## 2016-07-24 DIAGNOSIS — H93213 Auditory recruitment, bilateral: Secondary | ICD-10-CM | POA: Diagnosis not present

## 2016-07-24 DIAGNOSIS — H903 Sensorineural hearing loss, bilateral: Secondary | ICD-10-CM | POA: Diagnosis not present

## 2016-07-24 NOTE — Procedures (Signed)
[facility] [place]  [place], [place] [address]  [phone]   Audiological Evaluation  Patient Name: Jill Washington   Status: Outpatient   DOB: [birth year]    Diagnosis: Hearing Loss                MRN: [id number] Date:  07/24/2016     Referent: [name], MD  History: Jill Washington was seen for an audiological evaluation. Accompanied by: Her daughter Primary Concern: Difficulty hearing normal conversation and difficulty hearing on the telephone. History of hearing problems: Y  First noticed hearing loss after a serious illness in the 1980's when she was "hospitalized for 3 months".  History of ear infections:  Y  As a child but not as an adult History of dizziness and balance issues:   Y  Has "neuropathy" in legs and feet - reports balance issues and uses a cane at the physician's request. History of hypertension: Y in the past, but not currently. History of diabetes:   N  Although there is a family history. Family history of hearing loss:  Y Mother began wearing hearing aids at age 50. Other: Treated for hypothyroidism, GERD   Evaluation: Conventional pure tone audiometry from _0  - _1  with using insert earphones. The hearing thresholds are symmetrical ranging from 60-80 dBHL from _2  - _3  and no response on the left side and 95 dBHL on the right side at _4 .  The hearing loss appears sensorineural bilaterally. Reliability is good Speech reception levels (repeating words near threshold) using recorded spondee word lists:  Right ear: 70 dBHL.  Left ear:  70 dBHL Word recognition (at comfortably loud volumes) using recorded NU-6 word lists at 85dBHL (very loud levels), in quiet.  Right ear: 56%.  Left ear:   88% Tympanometry (middle ear function) showed normal middle ear volume, pressure and compliance bilaterally (Type A).    CONCLUSION:  Jill Washington has a moderately severe to profound sensorineural  hearing loss bilaterally. Word recognition is poor in the right ear and very good in the left ear when presented at comfortably loud levels which is loud.    This amount of hearing loss will make  communication at normal conversational speech levels difficult to no possible unless the speaker is standing close to Jill Washington, looking directly at her and speaking in a very loud noise, close to a shout.  Jill Washington is aware that she relies on lipreading to communicate but that she still "misses a lot".   Jill Washington has difficulty using the telephone at home.   Jill Washington is an excellent candidate for amplification; therefore a hearing aid evaluation is recommended.  However, as discussed with Jill Washington since the word recognition is significantly poorer on the left side (with symmetrical hearing levels), medical clearance by an ENT is recommended prior to the hearing aid evaluation.  RECOMMENDATIONS: 1. ENT referral because word recognition is poorer on the left side even though the hearing thresholds are about the same. 2. Hearing aid evaluation after medical clearance by ENT 3. Contact insurance about hearing aids. 4.  [employer] in [place] may help with obtaining one hearing aid or one captioned telephone if hearing loss and financial qualifications are met. Please contact Jill Washington at [phone] for a current list of providers. Providers local to [place]: [facility] [address] [place], [place] [address] Telephone:  [phone]  [facility] [address], [place]  [place], [place] [address] [phone]   [facility] Department of Audiology [address] [place], [place] [address] [phone].    5.   Strategies that help improve hearing include: A) Face the speaker directly. Optimal is having the speakers face well - lit.  Unless amplified, being within 3-6 feet of the speaker will enhance word recognition. B) Avoid having the speaker back-lit as this will minimize the ability to use cues from lip-reading, facial expression and gestures. C)  Word recognition is poorer in background noise. For optimal word recognition, turn off the TV, radio or noisy fan when engaging in conversation. In a restaurant, try to sit away from noise sources and close to the primary speaker.  D)  Ask for topic clarification from time to time in order to remain in the conversation.  Most people don't mind repeating or clarifying a point when asked.  If needed, explain the difficulty hearing in background noise or hearing loss.  Vera Furniss L. Heide Spark, Au.D., CCC-A Doctor of Audiology 07/24/2016  cc: [name], MD

## 2016-07-25 DIAGNOSIS — L820 Inflamed seborrheic keratosis: Secondary | ICD-10-CM | POA: Diagnosis not present

## 2016-08-03 ENCOUNTER — Other Ambulatory Visit: Payer: Self-pay | Admitting: Family Medicine

## 2016-08-12 ENCOUNTER — Ambulatory Visit (INDEPENDENT_AMBULATORY_CARE_PROVIDER_SITE_OTHER): Payer: Medicare Other | Admitting: Pharmacist Clinician (PhC)/ Clinical Pharmacy Specialist

## 2016-08-12 DIAGNOSIS — Z7901 Long term (current) use of anticoagulants: Secondary | ICD-10-CM | POA: Diagnosis not present

## 2016-08-12 DIAGNOSIS — I4891 Unspecified atrial fibrillation: Secondary | ICD-10-CM | POA: Diagnosis not present

## 2016-08-12 LAB — POCT INR: INR: 3.8

## 2016-08-14 DIAGNOSIS — M25561 Pain in right knee: Secondary | ICD-10-CM | POA: Diagnosis not present

## 2016-08-14 DIAGNOSIS — M25562 Pain in left knee: Secondary | ICD-10-CM | POA: Diagnosis not present

## 2016-08-15 ENCOUNTER — Other Ambulatory Visit (INDEPENDENT_AMBULATORY_CARE_PROVIDER_SITE_OTHER): Payer: Medicare Other

## 2016-08-15 DIAGNOSIS — N289 Disorder of kidney and ureter, unspecified: Secondary | ICD-10-CM | POA: Diagnosis not present

## 2016-08-15 LAB — COMPREHENSIVE METABOLIC PANEL
ALBUMIN: 3.9 g/dL (ref 3.5–5.2)
ALK PHOS: 53 U/L (ref 39–117)
ALT: 10 U/L (ref 0–35)
AST: 17 U/L (ref 0–37)
BILIRUBIN TOTAL: 0.4 mg/dL (ref 0.2–1.2)
BUN: 23 mg/dL (ref 6–23)
CALCIUM: 9.5 mg/dL (ref 8.4–10.5)
CO2: 25 mEq/L (ref 19–32)
Chloride: 110 mEq/L (ref 96–112)
Creatinine, Ser: 1.8 mg/dL — ABNORMAL HIGH (ref 0.40–1.20)
GFR: 28.21 mL/min — AB (ref >60.00)
Glucose, Bld: 82 mg/dL (ref 70–99)
POTASSIUM: 4.7 meq/L (ref 3.5–5.1)
Sodium: 141 mEq/L (ref 135–145)
TOTAL PROTEIN: 6.7 g/dL (ref 6.0–8.3)

## 2016-08-23 DIAGNOSIS — J209 Acute bronchitis, unspecified: Secondary | ICD-10-CM | POA: Diagnosis not present

## 2016-08-26 ENCOUNTER — Telehealth: Payer: Self-pay | Admitting: *Deleted

## 2016-08-26 ENCOUNTER — Ambulatory Visit (INDEPENDENT_AMBULATORY_CARE_PROVIDER_SITE_OTHER): Payer: Medicare Other | Admitting: Pharmacist Clinician (PhC)/ Clinical Pharmacy Specialist

## 2016-08-26 DIAGNOSIS — I4891 Unspecified atrial fibrillation: Secondary | ICD-10-CM | POA: Diagnosis not present

## 2016-08-26 DIAGNOSIS — Z7901 Long term (current) use of anticoagulants: Secondary | ICD-10-CM | POA: Diagnosis not present

## 2016-08-26 LAB — POCT INR: INR: 2.4

## 2016-08-26 NOTE — Telephone Encounter (Signed)
Problem List updated as directed.

## 2016-08-26 NOTE — Telephone Encounter (Signed)
-----   Message from Mosie Lukes, MD sent at 08/26/2016  1:21 PM EST ----- Regarding: RE: [employer] OK to make SSS h/o Would change colon polyp and shingles to h/o but leave them in problem list because I need to monitor. The others can be removed. Thanks ----- Message ----- From: Dorrene German, RN Sent: 08/26/2016   9:52 AM To: Mosie Lukes, MD Subject: [employer] Opportunity                                Per chart review, pt does not have sick sinus syndrome anymore based on last EKG/permanent pacemaker in place.   Can current diagnosis of sick sinus syndrome be removed from  problem list as?   Can current diagnoses of empyema, rhinovirus infection, and R leg cellulitis be removed from problem list as they are resolved acute issues?   Can current diagnoses of colon polyp, shingles, chicken pox, measles, and mumps be removed from problem list as they are already in pt's hx?    Thanks, Hoyle Sauer

## 2016-08-29 ENCOUNTER — Ambulatory Visit (INDEPENDENT_AMBULATORY_CARE_PROVIDER_SITE_OTHER): Payer: Medicare Other | Admitting: Pharmacist

## 2016-08-29 DIAGNOSIS — I4891 Unspecified atrial fibrillation: Secondary | ICD-10-CM

## 2016-08-29 DIAGNOSIS — Z7901 Long term (current) use of anticoagulants: Secondary | ICD-10-CM

## 2016-08-29 LAB — POCT INR: INR: 2.4

## 2016-09-02 ENCOUNTER — Telehealth: Payer: Self-pay | Admitting: Cardiovascular Disease

## 2016-09-02 NOTE — Telephone Encounter (Signed)
Follow Up ° ° ° ° °Pt is returning call from earlier. Please call. °

## 2016-09-02 NOTE — Telephone Encounter (Signed)
Spoke with pt she states that she does not know who called her. I cannot find who called her. Pt notified

## 2016-09-12 ENCOUNTER — Ambulatory Visit (INDEPENDENT_AMBULATORY_CARE_PROVIDER_SITE_OTHER): Payer: Medicare Other | Admitting: Pharmacist

## 2016-09-12 DIAGNOSIS — Z7901 Long term (current) use of anticoagulants: Secondary | ICD-10-CM

## 2016-09-12 DIAGNOSIS — I4891 Unspecified atrial fibrillation: Secondary | ICD-10-CM | POA: Diagnosis not present

## 2016-09-12 LAB — POCT INR: INR: 2.3

## 2016-09-20 DIAGNOSIS — M1711 Unilateral primary osteoarthritis, right knee: Secondary | ICD-10-CM | POA: Diagnosis not present

## 2016-09-25 ENCOUNTER — Other Ambulatory Visit: Payer: Self-pay | Admitting: Family Medicine

## 2016-09-25 NOTE — Telephone Encounter (Signed)
Pt was due for follow up with Dr Charlett Blake in November and is past due. Please call pt to schedule appt soon. THanks!

## 2016-09-26 ENCOUNTER — Other Ambulatory Visit: Payer: Self-pay | Admitting: Family Medicine

## 2016-09-26 MED ORDER — TRAMADOL HCL 50 MG PO TABS: 50.0000 mg | ORAL_TABLET | Freq: Every day | ORAL | 0 refills | Status: DC | PRN

## 2016-09-26 NOTE — Telephone Encounter (Signed)
Last refill   #20 with 0 refills on 05/31/2016 Last office visit 05/28/2016

## 2016-09-26 NOTE — Telephone Encounter (Signed)
Faxed hardcopy for Tramadol to [facility] at [phone]

## 2016-10-03 ENCOUNTER — Ambulatory Visit (INDEPENDENT_AMBULATORY_CARE_PROVIDER_SITE_OTHER): Payer: Medicare Other | Admitting: Pharmacist Clinician (PhC)/ Clinical Pharmacy Specialist

## 2016-10-03 DIAGNOSIS — I480 Paroxysmal atrial fibrillation: Secondary | ICD-10-CM | POA: Diagnosis not present

## 2016-10-03 DIAGNOSIS — Z7901 Long term (current) use of anticoagulants: Secondary | ICD-10-CM | POA: Diagnosis not present

## 2016-10-03 LAB — POCT INR: INR: 2

## 2016-10-06 ENCOUNTER — Other Ambulatory Visit: Payer: Self-pay | Admitting: Family Medicine

## 2016-10-07 ENCOUNTER — Other Ambulatory Visit: Payer: Self-pay | Admitting: Family Medicine

## 2016-10-08 NOTE — Telephone Encounter (Signed)
Patient scheduled with PCP for 10/10/2016

## 2016-10-10 ENCOUNTER — Ambulatory Visit (INDEPENDENT_AMBULATORY_CARE_PROVIDER_SITE_OTHER): Payer: Medicare Other | Admitting: Family Medicine

## 2016-10-10 ENCOUNTER — Encounter: Payer: Self-pay | Admitting: Family Medicine

## 2016-10-10 VITALS — BP 124/68 | HR 72 | Temp 97.5°F | Wt 197.2 lb

## 2016-10-10 DIAGNOSIS — E7800 Pure hypercholesterolemia, unspecified: Secondary | ICD-10-CM

## 2016-10-10 DIAGNOSIS — D649 Anemia, unspecified: Secondary | ICD-10-CM | POA: Diagnosis not present

## 2016-10-10 DIAGNOSIS — E785 Hyperlipidemia, unspecified: Secondary | ICD-10-CM | POA: Diagnosis not present

## 2016-10-10 DIAGNOSIS — E039 Hypothyroidism, unspecified: Secondary | ICD-10-CM

## 2016-10-10 DIAGNOSIS — E559 Vitamin D deficiency, unspecified: Secondary | ICD-10-CM | POA: Diagnosis not present

## 2016-10-10 DIAGNOSIS — E538 Deficiency of other specified B group vitamins: Secondary | ICD-10-CM

## 2016-10-10 DIAGNOSIS — I10 Essential (primary) hypertension: Secondary | ICD-10-CM

## 2016-10-10 DIAGNOSIS — I480 Paroxysmal atrial fibrillation: Secondary | ICD-10-CM

## 2016-10-10 MED ORDER — ALBUTEROL SULFATE HFA 108 (90 BASE) MCG/ACT IN AERS: 2.0000 | INHALATION_SPRAY | Freq: Four times a day (QID) | RESPIRATORY_TRACT | 2 refills | Status: DC | PRN

## 2016-10-10 NOTE — Progress Notes (Signed)
Subjective:    Patient ID: ROSALIN BUSTER, female    DOB: 07-12-1926, 82 y.o.   MRN: [id number]  Chief Complaint  Patient presents with  . 43-month F/U.    Knee pain. Is being seen by Ortho.    HPI Patient is in today for 27-month follow up for knee pain. No acute concerns noted. No recent hospitalization or acute febrile illness. Denies CP/palp/SOB/HA/congestion/fevers/GI or GU c/o. Taking meds as prescribed. Did fall recently and injure elbow and right arm. She reports they are improving and she feels the pain in arm will resolve  Past Medical History:  Diagnosis Date  . Arthritis   . Blood transfusion   . CHB (complete heart block) ([place]) 11/10/2014  . Chicken pox as a child  . Colon polyps    hyperplastic  . Critical illness polyneuropathy ([place])   . Diverticulosis of sigmoid colon 02/05/2007   mild  . Fecal incontinence   . Fibromyalgia   . GERD (gastroesophageal reflux disease)   . H/O measles   . H/O mumps   . Hiatal hernia   . History of atrial fibrillation   . History of chicken pox   . History of empyema of pleura   . History of gallstones   . History of skin cancer    face  . Hypercholesteremia   . Hypertension   . Hypothyroidism   . IBS (irritable bowel syndrome)   . Measles as a child  . Medicare annual wellness visit, subsequent 05/15/2015   Allergies verified: UTD   Immunization Status: Flu vaccine-- NA  Tdap-- 9-10 years ago per patient  PNA-- 09/30/07 (23)  Shingles-- 04/07/14   A/P:  Changes to [place], [place] or Personal Hx: UTD Pap-- none recent  MMG--declines further MMgs (last 08/12/03 BI-RADS 1: Neg)  Bone Density-- 08/11/03 with Prescott Gum, MD- Normal, declines further testing CCS-- 05/07/12 with Silvano Rusk, MD- polyp removed- no   . Mitral valve prolapse   . Mumps as a child  . Overactive bladder 05/07/2011  . Pacemaker   . Peripheral neuropathy ([place])   . Shingles 8 yrs ago  . Sick sinus syndrome ([place])   . Urinary incontinence   . Venous insufficiency    . Vitamin D deficiency     Past Surgical History:  Procedure Laterality Date  . APPENDECTOMY  1971  . CARDIAC CATHETERIZATION  05/10/1997   Normal coronaries, MVP  . CERVICAL SPINE SURGERY    . CHOLECYSTECTOMY  03/2005   by Dr. Dalbert Batman  . COLONOSCOPY  02/05/2007,02/05/05   Dr. Silvano Rusk  . ESOPHAGOGASTRODUODENOSCOPY  02/05/2005,01/22/90   Dr. Silvano Rusk, Dr. Rachelle Hora  . NM MYOCAR PERF WALL MOTION  08/24/2010   Normal  . PACEMAKER GENERATOR CHANGE N/A 02/25/2012   Procedure: PACEMAKER GENERATOR CHANGE;  Surgeon: Sanda Klein, MD;  Location: [facility]ATH [facility];  Service: Cardiovascular;  Laterality: N/A;  . pacemaker implanted  05/11/97,08/02/05   [facility]  . TONSILLECTOMY    . TOTAL ABDOMINAL HYSTERECTOMY W/ BILATERAL SALPINGOOPHORECTOMY      Family History  Problem Relation Age of Onset  . Pancreatic cancer Mother   . Diabetes Mother 48    type 2  . Stomach cancer Maternal Aunt   . Heart attack Brother   . Diabetes Brother     type 2  . Gallbladder disease Father   . Diabetes Father     type 2  . Diabetes Brother     X 2   . Cancer Sister  80    skin ca on face  . Diabetes Sister     type 2  . Colon cancer    . Diabetes Daughter     type 2  . Hypertension Daughter   . Alcohol abuse Daughter   . Diabetes Son     type 2  . Diabetes Sister     type 1  . Hypertension Sister     Social History   Social History  . Marital status: Widowed    Spouse name: N/A  . Number of children: 3  . Years of education: N/A   Occupational History  . retired Retired   Social History Main Topics  . Smoking status: Former Smoker    Packs/day: 2.00    Years: 6.00    Types: Cigarettes    Quit date: 10/15/1951  . Smokeless tobacco: Never Used  . Alcohol use No  . Drug use: No  . Sexual activity: Not on file   Other Topics Concern  . Not on file   Social History Narrative   She is a widow, one son to daughters. She is retired.    Outpatient Medications Prior to Visit   Medication Sig Dispense Refill  . amiodarone (PACERONE) 200 MG tablet Take 0.5 tablets (100 mg total) by mouth daily. 45 tablet 3  . atenolol (TENORMIN) 50 MG tablet TAKE ONE AND ONE-HALF TABLETS DAILY 135 tablet 2  . cholecalciferol (VITAMIN D) 1000 UNITS tablet Take 1,000 Units by mouth daily.    . furosemide (LASIX) 20 MG tablet TAKE 1 TABLET DAILY 90 tablet 0  . gabapentin (NEURONTIN) 300 MG capsule TAKE 1 CAPSULE TWICE A DAY 180 capsule 1  . levothyroxine (SYNTHROID, LEVOTHROID) 137 MCG tablet TAKE 1 TABLET DAILY 90 tablet 2  . Multiple Vitamin (MULTIVITAMIN WITH MINERALS) TABS tablet Take 1 tablet by mouth daily.    Marland Kitchen MYRBETRIQ 50 MG TB24 tablet Take 1 tablet by mouth daily.    Marland Kitchen omeprazole (PRILOSEC) 40 MG capsule TAKE 1 CAPSULE DAILY 90 capsule 1  . polyvinyl alcohol (ARTIFICIAL TEARS) 1.4 % ophthalmic solution Place 1 drop into both eyes 3 (three) times daily as needed for dry eyes.    . pravastatin (PRAVACHOL) 40 MG tablet TAKE 1 TABLET DAILY 90 tablet 1  . traMADol (ULTRAM) 50 MG tablet Take 1 tablet (50 mg total) by mouth daily as needed for severe pain. 90 tablet 0  . trimethoprim (TRIMPEX) 100 MG tablet Take 100 mg by mouth daily.    . valsartan-hydrochlorothiazide (DIOVAN-HCT) 80-12.5 MG tablet TAKE 1 TABLET DAILY 90 tablet 2  . warfarin (COUMADIN) 2 MG tablet TAKE 1 TABLET DAILY OR AS DIRECTED 90 tablet 0   No facility-administered medications prior to visit.     Allergies  Allergen Reactions  . Cymbalta [Duloxetine Hcl] Other (See Comments)    Could not think straight  . Iodinated Diagnostic Agents Other (See Comments)    Pt was not told what the reaction was  . Other Itching    Dial Soap    Review of Systems  Constitutional: Negative for fever.  Eyes: Negative for blurred vision.  Respiratory: Negative for cough and shortness of breath.   Cardiovascular: Negative for chest pain.  Gastrointestinal: Negative for vomiting.  Musculoskeletal: Positive for joint  pain. Negative for back pain.  Skin: Negative for rash.  Neurological: Negative for loss of consciousness and headaches.       Objective:    Physical Exam  Constitutional: She is oriented  to person, place, and time. She appears well-developed and well-nourished. No distress.  HENT:  Head: Normocephalic and atraumatic.  Eyes: Conjunctivae are normal.  Neck: Normal range of motion. No thyromegaly present.  Cardiovascular: Normal rate and regular rhythm.   Pulmonary/Chest: Effort normal and breath sounds normal. She has no wheezes.  Abdominal: Soft. Bowel sounds are normal. There is no tenderness.  Musculoskeletal: She exhibits no edema or deformity.  Neurological: She is alert and oriented to person, place, and time.  Skin: Skin is warm and dry. She is not diaphoretic.  Psychiatric: She has a normal mood and affect.    BP 124/68 (BP Location: Left Arm, Patient Position: Sitting, Cuff Size: Normal)   Pulse 72   Temp 97.5 F (36.4 C) (Oral)   Wt 197 lb 3.2 oz (89.4 kg)   SpO2 98% Comment: RA  BMI 31.83 kg/m  Wt Readings from Last 3 Encounters:  10/10/16 197 lb 3.2 oz (89.4 kg)  05/28/16 195 lb (88.5 kg)  04/29/16 193 lb 6.4 oz (87.7 kg)     Lab Results  Component Value Date   WBC 7.4 05/28/2016   HGB 12.1 05/28/2016   HCT 35.9 (L) 05/28/2016   PLT 208.0 05/28/2016   GLUCOSE 82 08/15/2016   CHOL 163 05/28/2016   TRIG 196.0 (H) 05/28/2016   HDL 47.10 05/28/2016   LDLDIRECT 77.0 04/28/2015   LDLCALC 76 05/28/2016   ALT 10 08/15/2016   AST 17 08/15/2016   NA 141 08/15/2016   K 4.7 08/15/2016   CL 110 08/15/2016   CREATININE 1.80 (H) 08/15/2016   BUN 23 08/15/2016   CO2 25 08/15/2016   TSH 2.23 05/28/2016   INR 2.0 10/03/2016   HGBA1C 5.3 08/02/2015    Lab Results  Component Value Date   TSH 2.23 05/28/2016   Lab Results  Component Value Date   WBC 7.4 05/28/2016   HGB 12.1 05/28/2016   HCT 35.9 (L) 05/28/2016   MCV 91.8 05/28/2016   PLT 208.0  05/28/2016   Lab Results  Component Value Date   NA 141 08/15/2016   K 4.7 08/15/2016   CO2 25 08/15/2016   GLUCOSE 82 08/15/2016   BUN 23 08/15/2016   CREATININE 1.80 (H) 08/15/2016   BILITOT 0.4 08/15/2016   ALKPHOS 53 08/15/2016   AST 17 08/15/2016   ALT 10 08/15/2016   PROT 6.7 08/15/2016   ALBUMIN 3.9 08/15/2016   CALCIUM 9.5 08/15/2016   ANIONGAP 14 08/09/2015   GFR 28.21 (L) 08/15/2016   Lab Results  Component Value Date   CHOL 163 05/28/2016   Lab Results  Component Value Date   HDL 47.10 05/28/2016   Lab Results  Component Value Date   LDLCALC 76 05/28/2016   Lab Results  Component Value Date   TRIG 196.0 (H) 05/28/2016   Lab Results  Component Value Date   CHOLHDL 3 05/28/2016   Lab Results  Component Value Date   HGBA1C 5.3 08/02/2015      I acted as a Education administrator for Dr. Charlett Blake. Raiford Noble, RMA  Assessment & Plan:   Problem List Items Addressed This Visit    Hypothyroidism    On Levothyroxine, continue to monitor      Relevant Orders   TSH   Vitamin D deficiency    Encouraged ongoing daily supplements      Relevant Orders   VITAMIN D 25 Hydroxy (Vit-D Deficiency, Fractures)   HYPERCHOLESTEROLEMIA    Tolerating statin, encouraged heart healthy diet, avoid  trans fats, minimize simple carbs and saturated fats. Increase exercise as tolerated      Relevant Orders   Comprehensive metabolic panel   Lipid Profile   Essential hypertension    Well controlled, no changes to meds. Encouraged heart healthy diet such as the DASH diet and exercise as tolerated.       Atrial fibrillation (HCC)    Tolerating warfarin an drate conctrolled      Relevant Orders   Vitamin B12   Comprehensive metabolic panel   Lipid Profile   TSH   VITAMIN D 25 Hydroxy (Vit-D Deficiency, Fractures)   CBC   B12 deficiency   Relevant Orders   Vitamin B12   Anemia - Primary   Relevant Orders   Vitamin B12   Comprehensive metabolic panel   Lipid Profile   TSH    VITAMIN D 25 Hydroxy (Vit-D Deficiency, Fractures)   CBC    Other Visit Diagnoses    Hyperlipidemia, unspecified hyperlipidemia type       Relevant Orders   Vitamin B12   Comprehensive metabolic panel   Lipid Profile   TSH   VITAMIN D 25 Hydroxy (Vit-D Deficiency, Fractures)   CBC      I am having Ms. Oelkers start on albuterol. I am also having her maintain her multivitamin with minerals, cholecalciferol, polyvinyl alcohol, trimethoprim, MYRBETRIQ, amiodarone, atenolol, valsartan-hydrochlorothiazide, warfarin, pravastatin, omeprazole, gabapentin, traMADol, furosemide, and levothyroxine.  Meds ordered this encounter  Medications  . albuterol (PROVENTIL HFA;VENTOLIN HFA) 108 (90 Base) MCG/ACT inhaler    Sig: Inhale 2 puffs into the lungs every 6 (six) hours as needed for wheezing or shortness of breath.    Dispense:  1 Inhaler    Refill:  2   CMA served as scribe during this visit. History, Physical and Plan performed by medical provider. Documentation and orders reviewed and attested to.  [name], MD

## 2016-10-10 NOTE — Progress Notes (Signed)
Patient ID: Jill Washington, female   DOB: 10-16-1925, 82 y.o.   MRN: [id number]

## 2016-10-10 NOTE — Progress Notes (Signed)
Pre visit review using our clinic review tool, if applicable. No additional management support is needed unless otherwise documented below in the visit note. 

## 2016-10-10 NOTE — Patient Instructions (Addendum)
Start eating less fatty,spicy foods before bed. Start taking a Probiotic for a couple of weeks (suggested NOW Probiotic). Use Aspercreme along with Lidocaine Creme to help with pain and inflammation of shoulder and knee pain. Chronic Obstructive Pulmonary Disease Chronic obstructive pulmonary disease (COPD) is a common lung condition in which airflow from the lungs is limited. COPD is a general term that can be used to describe many different lung problems that limit airflow, including both chronic bronchitis and emphysema. If you have COPD, your lung function will probably never return to normal, but there are measures you can take to improve lung function and make yourself feel better. What are the causes?  Smoking (common).  Exposure to secondhand smoke.  Genetic problems.  Chronic inflammatory lung diseases or recurrent infections. What are the signs or symptoms?  Shortness of breath, especially with physical activity.  Deep, persistent (chronic) cough with a large amount of thick mucus.  Wheezing.  Rapid breaths (tachypnea).  Gray or bluish discoloration (cyanosis) of the skin, especially in your fingers, toes, or lips.  Fatigue.  Weight loss.  Frequent infections or episodes when breathing symptoms become much worse (exacerbations).  Chest tightness. How is this diagnosed? Your health care provider will take a medical history and perform a physical examination to diagnose COPD. Additional tests for COPD may include:  Lung (pulmonary) function tests.  Chest X-ray.  CT scan.  Blood tests. How is this treated? Treatment for COPD may include:  Inhaler and nebulizer medicines. These help manage the symptoms of COPD and make your breathing more comfortable.  Supplemental oxygen. Supplemental oxygen is only helpful if you have a low oxygen level in your blood.  Exercise and physical activity. These are beneficial for nearly all people with COPD.  Lung surgery or  transplant.  Nutrition therapy to gain weight, if you are underweight.  Pulmonary rehabilitation. This may involve working with a team of health care providers and specialists, such as respiratory, occupational, and physical therapists. Follow these instructions at home:  Take all medicines (inhaled or pills) as directed by your health care provider.  Avoid over-the-counter medicines or cough syrups that dry up your airway (such as antihistamines) and slow down the elimination of secretions unless instructed otherwise by your health care provider.  If you are a smoker, the most important thing that you can do is stop smoking. Continuing to smoke will cause further lung damage and breathing trouble. Ask your health care provider for help with quitting smoking. He or she can direct you to community resources or hospitals that provide support.  Avoid exposure to irritants such as smoke, chemicals, and fumes that aggravate your breathing.  Use oxygen therapy and pulmonary rehabilitation if directed by your health care provider. If you require home oxygen therapy, ask your health care provider whether you should purchase a pulse oximeter to measure your oxygen level at home.  Avoid contact with individuals who have a contagious illness.  Avoid extreme temperature and humidity changes.  Eat healthy foods. Eating smaller, more frequent meals and resting before meals may help you maintain your strength.  Stay active, but balance activity with periods of rest. Exercise and physical activity will help you maintain your ability to do things you want to do.  Preventing infection and hospitalization is very important when you have COPD. Make sure to receive all the vaccines your health care provider recommends, especially the pneumococcal and influenza vaccines. Ask your health care provider whether you need a pneumonia vaccine.  Learn and use relaxation techniques to manage stress.  Learn and use  controlled breathing techniques as directed by your health care provider. Controlled breathing techniques include: 1. Pursed lip breathing. Start by breathing in (inhaling) through your nose for 1 second. Then, purse your lips as if you were going to whistle and breathe out (exhale) through the pursed lips for 2 seconds. 2. Diaphragmatic breathing. Start by putting one hand on your abdomen just above your waist. Inhale slowly through your nose. The hand on your abdomen should move out. Then purse your lips and exhale slowly. You should be able to feel the hand on your abdomen moving in as you exhale.  Learn and use controlled coughing to clear mucus from your lungs. Controlled coughing is a series of short, progressive coughs. The steps of controlled coughing are: 1. Lean your head slightly forward. 2. Breathe in deeply using diaphragmatic breathing. 3. Try to hold your breath for 3 seconds. 4. Keep your mouth slightly open while coughing twice. 5. Spit any mucus out into a tissue. 6. Rest and repeat the steps once or twice as needed. Contact a health care provider if:  You are coughing up more mucus than usual.  There is a change in the color or thickness of your mucus.  Your breathing is more labored than usual.  Your breathing is faster than usual. Get help right away if:  You have shortness of breath while you are resting.  You have shortness of breath that prevents you from:  Being able to talk.  Performing your usual physical activities.  You have chest pain lasting longer than 5 minutes.  Your skin color is more cyanotic than usual.  You measure low oxygen saturations for longer than 5 minutes with a pulse oximeter. This information is not intended to replace advice given to you by your health care provider. Make sure you discuss any questions you have with your health care provider. Document Released: 07/10/2005 Document Revised: 03/07/2016 Document Reviewed:  05/27/2013 Elsevier Interactive Patient Education  2017 [employer].

## 2016-10-16 NOTE — Assessment & Plan Note (Signed)
Tolerating statin, encouraged heart healthy diet, avoid trans fats, minimize simple carbs and saturated fats. Increase exercise as tolerated 

## 2016-10-16 NOTE — Assessment & Plan Note (Signed)
On Levothyroxine, continue to monitor 

## 2016-10-16 NOTE — Assessment & Plan Note (Signed)
Encouraged ongoing daily supplements

## 2016-10-16 NOTE — Assessment & Plan Note (Signed)
Well controlled, no changes to meds. Encouraged heart healthy diet such as the DASH diet and exercise as tolerated.  °

## 2016-10-16 NOTE — Assessment & Plan Note (Signed)
Tolerating warfarin an drate conctrolled

## 2016-10-20 ENCOUNTER — Other Ambulatory Visit: Payer: Self-pay | Admitting: Cardiovascular Disease

## 2016-10-22 ENCOUNTER — Other Ambulatory Visit: Payer: Medicare Other

## 2016-10-23 ENCOUNTER — Encounter: Payer: Self-pay | Admitting: Cardiovascular Disease

## 2016-10-23 ENCOUNTER — Ambulatory Visit (INDEPENDENT_AMBULATORY_CARE_PROVIDER_SITE_OTHER): Payer: Medicare Other | Admitting: Cardiovascular Disease

## 2016-10-23 ENCOUNTER — Ambulatory Visit (INDEPENDENT_AMBULATORY_CARE_PROVIDER_SITE_OTHER): Payer: Medicare Other | Admitting: Pharmacist

## 2016-10-23 VITALS — BP 142/82 | HR 70 | Ht 66.0 in | Wt 195.8 lb

## 2016-10-23 DIAGNOSIS — Z7901 Long term (current) use of anticoagulants: Secondary | ICD-10-CM | POA: Diagnosis not present

## 2016-10-23 DIAGNOSIS — E7800 Pure hypercholesterolemia, unspecified: Secondary | ICD-10-CM

## 2016-10-23 DIAGNOSIS — Z950 Presence of cardiac pacemaker: Secondary | ICD-10-CM

## 2016-10-23 DIAGNOSIS — I480 Paroxysmal atrial fibrillation: Secondary | ICD-10-CM | POA: Diagnosis not present

## 2016-10-23 DIAGNOSIS — I495 Sick sinus syndrome: Secondary | ICD-10-CM | POA: Diagnosis not present

## 2016-10-23 DIAGNOSIS — I442 Atrioventricular block, complete: Secondary | ICD-10-CM

## 2016-10-23 LAB — POCT INR: INR: 2.5

## 2016-10-23 NOTE — Progress Notes (Signed)
Patient ID: Jill Washington, female   DOB: 02/05/1926, 83 y.o.   MRN: [id number]    Cardiology Office Note    Date:  10/23/2016   ID:  Jill Washington, DOB 1926/07/22, MRN [id number]  PCP:  [name], MD  Cardiologist:   Sanda Klein, MD   Chief Complaint  Patient presents with  . Follow-up    PT C/O DOE    History of Present Illness:  Jill Washington is a 83 y.o. female with Complete heart block, sinus node dysfunction, Paroxysmal atrial fibrillation.  She has chronic lower extremity edema with venous stasis dermatitis, no worse than usual. The patient specifically denies any chest pain at rest or exertion, dyspnea at rest or with exertion, orthopnea, paroxysmal nocturnal dyspnea, syncope, palpitations, focal neurological deficits, intermittent claudication, unexplained weight gain, cough, hemoptysis or wheezing.  Pacemaker interrogation shows normal device function. She has100% atrial pacing and is pacemaker dependent with 100% ventricular pacing due to complete heart block. No episodes of mode switch have been documented in the last 12 months. In fact she has not had atrial fibrillation since November 2016, despite previously having 5-6% atrial fibrillation every year. There are no high ventricular rates. He has not had any serious bleeding problems on chronic warfarin anticoagulation. Liver function tests and thyroid tests performed in the last 6 months are within normal range.  Past Medical History:  Diagnosis Date  . Arthritis   . Blood transfusion   . CHB (complete heart block) ([place]) 11/10/2014  . Chicken pox as a child  . Colon polyps    hyperplastic  . Critical illness polyneuropathy ([place])   . Diverticulosis of sigmoid colon 02/05/2007   mild  . Fecal incontinence   . Fibromyalgia   . GERD (gastroesophageal reflux disease)   . H/O measles   . H/O mumps   . Hiatal hernia   . History of atrial fibrillation   . History of chicken pox   . History of empyema of  pleura   . History of gallstones   . History of skin cancer    face  . Hypercholesteremia   . Hypertension   . Hypothyroidism   . IBS (irritable bowel syndrome)   . Measles as a child  . Medicare annual wellness visit, subsequent 05/15/2015   Allergies verified: UTD   Immunization Status: Flu vaccine-- NA  Tdap-- 9-10 years ago per patient  PNA-- 09/30/07 (23)  Shingles-- 04/07/14   A/P:  Changes to [place], [place] or Personal Hx: UTD Pap-- none recent  MMG--declines further MMgs (last 08/12/03 BI-RADS 1: Neg)  Bone Density-- 08/11/03 with Prescott Gum, MD- Normal, declines further testing CCS-- 05/07/12 with Silvano Rusk, MD- polyp removed- no   . Mitral valve prolapse   . Mumps as a child  . Overactive bladder 05/07/2011  . Pacemaker   . Peripheral neuropathy ([place])   . Shingles 8 yrs ago  . Sick sinus syndrome ([place])   . Urinary incontinence   . Venous insufficiency   . Vitamin D deficiency     Past Surgical History:  Procedure Laterality Date  . APPENDECTOMY  1971  . CARDIAC CATHETERIZATION  05/10/1997   Normal coronaries, MVP  . CERVICAL SPINE SURGERY    . CHOLECYSTECTOMY  03/2005   by Dr. Dalbert Batman  . COLONOSCOPY  02/05/2007,02/05/05   Dr. Silvano Rusk  . ESOPHAGOGASTRODUODENOSCOPY  02/05/2005,01/22/90   Dr. Silvano Rusk, Dr. Rachelle Hora  . NM MYOCAR PERF WALL MOTION  08/24/2010   Normal  .  PACEMAKER GENERATOR CHANGE N/A 02/25/2012   Procedure: PACEMAKER GENERATOR CHANGE;  Surgeon: Sanda Klein, MD;  Location: [facility];  Service: Cardiovascular;  Laterality: N/A;  . pacemaker implanted  05/11/97,08/02/05   [place]  . TONSILLECTOMY    . TOTAL ABDOMINAL HYSTERECTOMY W/ BILATERAL SALPINGOOPHORECTOMY      Current Medications: Outpatient Medications Prior to Visit  Medication Sig Dispense Refill  . albuterol (PROVENTIL HFA;VENTOLIN HFA) 108 (90 Base) MCG/ACT inhaler Inhale 2 puffs into the lungs every 6 (six) hours as needed for wheezing or shortness of breath. 1 Inhaler 2  .  amiodarone (PACERONE) 200 MG tablet Take 0.5 tablets (100 mg total) by mouth daily. 45 tablet 3  . atenolol (TENORMIN) 50 MG tablet TAKE ONE AND ONE-HALF TABLETS DAILY 135 tablet 2  . cholecalciferol (VITAMIN D) 1000 UNITS tablet Take 1,000 Units by mouth daily.    . furosemide (LASIX) 20 MG tablet TAKE 1 TABLET DAILY 90 tablet 0  . gabapentin (NEURONTIN) 300 MG capsule TAKE 1 CAPSULE TWICE A DAY 180 capsule 1  . levothyroxine (SYNTHROID, LEVOTHROID) 137 MCG tablet TAKE 1 TABLET DAILY 90 tablet 2  . Multiple Vitamin (MULTIVITAMIN WITH MINERALS) TABS tablet Take 1 tablet by mouth daily.    Marland Kitchen MYRBETRIQ 50 MG TB24 tablet Take 1 tablet by mouth daily.    Marland Kitchen omeprazole (PRILOSEC) 40 MG capsule TAKE 1 CAPSULE DAILY 90 capsule 1  . polyvinyl alcohol (ARTIFICIAL TEARS) 1.4 % ophthalmic solution Place 1 drop into both eyes 3 (three) times daily as needed for dry eyes.    . pravastatin (PRAVACHOL) 40 MG tablet TAKE 1 TABLET DAILY 90 tablet 1  . traMADol (ULTRAM) 50 MG tablet Take 1 tablet (50 mg total) by mouth daily as needed for severe pain. 90 tablet 0  . trimethoprim (TRIMPEX) 100 MG tablet Take 100 mg by mouth daily.    . valsartan-hydrochlorothiazide (DIOVAN-HCT) 80-12.5 MG tablet TAKE 1 TABLET DAILY 90 tablet 2  . warfarin (COUMADIN) 2 MG tablet TAKE 1 TABLET DAILY OR AS DIRECTED 90 tablet 1   No facility-administered medications prior to visit.      Allergies:   Iodinated diagnostic agents; Cymbalta [duloxetine hcl]; and Other   Social History   Social History  . Marital status: Widowed    Spouse name: N/A  . Number of children: 3  . Years of education: N/A   Occupational History  . retired Retired   Social History Main Topics  . Smoking status: Former Smoker    Packs/day: 2.00    Years: 6.00    Types: Cigarettes    Quit date: 10/15/1951  . Smokeless tobacco: Never Used  . Alcohol use No  . Drug use: No  . Sexual activity: Not Asked   Other Topics Concern  . None   Social  History Narrative   She is a widow, one son to daughters. She is retired.     Family History:  The patient's family history includes Alcohol abuse in her daughter; Cancer (age of onset: 60) in her sister; Diabetes in her brother, brother, daughter, father, sister, sister, and son; Diabetes (age of onset: 2) in her mother; Gallbladder disease in her father; Heart attack in her brother; Hypertension in her daughter and sister; Pancreatic cancer in her mother; Stomach cancer in her maternal aunt.   ROS:   Please see the history of present illness.    ROS All other systems reviewed and are negative.   PHYSICAL EXAM:   VS:  BP Marland Kitchen)  142/82 (BP Location: Left Arm, Patient Position: Sitting, Cuff Size: Large)   Pulse 70   Ht 5\' 6"  (1.676 m)   Wt 195 lb 12.8 oz (88.8 kg)   SpO2 97%   BMI 31.60 kg/m    GEN: Well nourished, well developed, in no acute distress  HEENT: normal  Neck: no JVD, carotid bruits, or masses Cardiac: Paradoxically split S2,. RRR; no murmurs, rubs, or gallops,no edema, Healthy subclavian pacemaker site Respiratory:  clear to auscultation bilaterally, normal work of breathing GI: soft, nontender, nondistended, + BS MS: no deformity or atrophy  Skin: warm and dry, no rash Neuro:  Alert and Oriented x 3, Strength and sensation are intact Psych: euthymic mood, full affect  Wt Readings from Last 3 Encounters:  10/23/16 195 lb 12.8 oz (88.8 kg)  10/10/16 197 lb 3.2 oz (89.4 kg)  05/28/16 195 lb (88.5 kg)      Studies/Labs Reviewed:   EKG:  EKG is ordered today.  The ekg ordered today demonstrates AV sequential paced rhythm  Recent Labs: 05/28/2016: Hemoglobin 12.1; Platelets 208.0; TSH 2.23 08/15/2016: ALT 10; BUN 23; Creatinine, Ser 1.80; Potassium 4.7; Sodium 141   Lipid Panel    Component Value Date/Time   CHOL 163 05/28/2016 1604   TRIG 196.0 (H) 05/28/2016 1604   HDL 47.10 05/28/2016 1604   CHOLHDL 3 05/28/2016 1604   VLDL 39.2 05/28/2016 1604   LDLCALC  76 05/28/2016 1604   LDLDIRECT 77.0 04/28/2015 1202    ASSESSMENT:    1. Paroxysmal atrial fibrillation (H[facility])   2. SSS (sick sinus syndrome) ([place])   3. CHB (complete heart block) (H[facility])   4. Long term current use of anticoagulant therapy   5. HYPERCHOLESTEROLEMIA   6. Pacemaker -  [place] leads implanted in 1998 generator change 2006 and 2013      PLAN:  In order of problems listed above:   1. Afib: markedly reduced burden of atrial arrhythmia even on very low dose amiodarone 100 mg daily. Recently checked liver and thyroid tests were normal. Continue warfarin anticoagulation. CHADSVasc at least 3. 2. SSS: 100% atrial pacing likely due to attributed to amiodarone. 3. CHB: pacemaker dependent (precedes amiodarone use). 4. Warfarin: Well-tolerated, no bleeding.  5. HLP: satisfactory lipid profile on recent assay; mild residual hypertriglyceridemia does not require pharmacological therapy; history of normal coronary arteries by angiography in 1998, normal nuclear stress test 2014 6. PPM: normal device function, remote [place] transmission every 3 months and office visit in 12 months.    Medication Adjustments/Labs and Tests Ordered: Current medicines are reviewed at length with the patient today.  Concerns regarding medicines are outlined above.  Medication changes, Labs and Tests ordered today are listed in the Patient Instructions below. Patient Instructions  Dr Sallyanne Kuster recommends that you continue on your current medications as directed. Please refer to the Current Medication list given to you today.  Remote monitoring is used to monitor your Pacemaker of ICD from home. This monitoring reduces the number of office visits required to check your device to one time per year. It allows [place] to keep an eye on the functioning of your device to ensure it is working properly. You are scheduled for a device check from home on Wednesday, April 11th, 2018. You may send your transmission at any  time that day. If you have a wireless device, the transmission will be sent automatically. After your physician reviews your transmission, you will receive a postcard with your next transmission date.  Dr Sallyanne Kuster recommends that you schedule a follow-up appointment in 12 months with a pacemaker check. You will receive a reminder letter in the mail two months in advance. If you don't receive a letter, please call our office to schedule the follow-up appointment.  If you need a refill on your cardiac medications before your next appointment, please call your pharmacy.    Signed, Sanda Klein, MD  10/23/2016 1:40 PM    [facility] [address], [place],   [address] Phone: [phone]; Fax: [phone]

## 2016-10-23 NOTE — Patient Instructions (Signed)
Dr Sallyanne Kuster recommends that you continue on your current medications as directed. Please refer to the Current Medication list given to you today.  Remote monitoring is used to monitor your Pacemaker of ICD from home. This monitoring reduces the number of office visits required to check your device to one time per year. It allows [place] to keep an eye on the functioning of your device to ensure it is working properly. You are scheduled for a device check from home on Wednesday, April 11th, 2018. You may send your transmission at any time that day. If you have a wireless device, the transmission will be sent automatically. After your physician reviews your transmission, you will receive a postcard with your next transmission date.  Dr Sallyanne Kuster recommends that you schedule a follow-up appointment in 12 months with a pacemaker check. You will receive a reminder letter in the mail two months in advance. If you don't receive a letter, please call our office to schedule the follow-up appointment.  If you need a refill on your cardiac medications before your next appointment, please call your pharmacy.

## 2016-10-24 LAB — CUP PACEART INCLINIC DEVICE CHECK
Date Time Interrogation Session: 20180111172256
Implantable Lead Implant Date: 19980729
Implantable Lead Location: 753860
MDC IDC LEAD IMPLANT DT: 19980729
MDC IDC LEAD LOCATION: 753859
MDC IDC PG IMPLANT DT: 20130514
Pulse Gen Model: 2210
Pulse Gen Serial Number: 7338831

## 2016-10-25 ENCOUNTER — Other Ambulatory Visit (INDEPENDENT_AMBULATORY_CARE_PROVIDER_SITE_OTHER): Payer: Medicare Other

## 2016-10-25 DIAGNOSIS — E559 Vitamin D deficiency, unspecified: Secondary | ICD-10-CM

## 2016-10-25 DIAGNOSIS — E538 Deficiency of other specified B group vitamins: Secondary | ICD-10-CM | POA: Diagnosis not present

## 2016-10-25 DIAGNOSIS — E039 Hypothyroidism, unspecified: Secondary | ICD-10-CM | POA: Diagnosis not present

## 2016-10-25 DIAGNOSIS — E7800 Pure hypercholesterolemia, unspecified: Secondary | ICD-10-CM | POA: Diagnosis not present

## 2016-10-25 DIAGNOSIS — D649 Anemia, unspecified: Secondary | ICD-10-CM | POA: Diagnosis not present

## 2016-10-25 DIAGNOSIS — E785 Hyperlipidemia, unspecified: Secondary | ICD-10-CM | POA: Diagnosis not present

## 2016-10-25 DIAGNOSIS — I480 Paroxysmal atrial fibrillation: Secondary | ICD-10-CM | POA: Diagnosis not present

## 2016-10-25 LAB — CBC
HEMATOCRIT: 35.4 % — AB (ref 36.0–46.0)
HEMOGLOBIN: 12 g/dL (ref 12.0–15.0)
MCHC: 34 g/dL (ref 30.0–36.0)
MCV: 91.5 fl (ref 78.0–100.0)
Platelets: 215 10*3/uL (ref 150.0–400.0)
RBC: 3.87 Mil/uL (ref 3.87–5.11)
RDW: 12.9 % (ref 11.5–15.5)
WBC: 5.1 10*3/uL (ref 4.0–10.5)

## 2016-10-25 LAB — COMPREHENSIVE METABOLIC PANEL
ALT: 9 U/L (ref 0–35)
AST: 17 U/L (ref 0–37)
Albumin: 3.8 g/dL (ref 3.5–5.2)
Alkaline Phosphatase: 54 U/L (ref 39–117)
BILIRUBIN TOTAL: 0.4 mg/dL (ref 0.2–1.2)
BUN: 24 mg/dL — ABNORMAL HIGH (ref 6–23)
CALCIUM: 9.6 mg/dL (ref 8.4–10.5)
CHLORIDE: 110 meq/L (ref 96–112)
CO2: 25 meq/L (ref 19–32)
Creatinine, Ser: 1.85 mg/dL — ABNORMAL HIGH (ref 0.40–1.20)
GFR: 27.32 mL/min — AB (ref >60.00)
GLUCOSE: 92 mg/dL (ref 70–99)
Potassium: 4.9 mEq/L (ref 3.5–5.1)
Sodium: 139 mEq/L (ref 135–145)
Total Protein: 6.5 g/dL (ref 6.0–8.3)

## 2016-10-25 LAB — TSH: TSH: 1.59 u[IU]/mL (ref 0.35–4.50)

## 2016-10-25 LAB — LIPID PANEL
CHOL/HDL RATIO: 4
Cholesterol: 158 mg/dL (ref 0–200)
HDL: 38.9 mg/dL — AB (ref >39.00)
LDL CALC: 80 mg/dL (ref 0–99)
NONHDL: 118.62
TRIGLYCERIDES: 192 mg/dL — AB (ref 0.0–149.0)
VLDL: 38.4 mg/dL (ref 0.0–40.0)

## 2016-10-25 LAB — VITAMIN B12: VITAMIN B 12: 1313 pg/mL — AB (ref 211–911)

## 2016-10-25 LAB — VITAMIN D 25 HYDROXY (VIT D DEFICIENCY, FRACTURES): VITD: 30.85 ng/mL (ref 30.00–100.00)

## 2016-11-20 ENCOUNTER — Ambulatory Visit (INDEPENDENT_AMBULATORY_CARE_PROVIDER_SITE_OTHER): Payer: Medicare Other | Admitting: Pharmacist

## 2016-11-20 DIAGNOSIS — Z7901 Long term (current) use of anticoagulants: Secondary | ICD-10-CM

## 2016-11-20 DIAGNOSIS — I480 Paroxysmal atrial fibrillation: Secondary | ICD-10-CM

## 2016-11-20 LAB — POCT INR: INR: 3.8

## 2016-11-26 ENCOUNTER — Other Ambulatory Visit: Payer: Self-pay | Admitting: Cardiovascular Disease

## 2016-11-26 NOTE — Telephone Encounter (Signed)
REFILL 

## 2016-12-06 ENCOUNTER — Ambulatory Visit (INDEPENDENT_AMBULATORY_CARE_PROVIDER_SITE_OTHER): Payer: Medicare Other | Admitting: Pharmacist

## 2016-12-06 DIAGNOSIS — Z7901 Long term (current) use of anticoagulants: Secondary | ICD-10-CM

## 2016-12-06 DIAGNOSIS — I480 Paroxysmal atrial fibrillation: Secondary | ICD-10-CM | POA: Diagnosis not present

## 2016-12-06 LAB — POCT INR: INR: 1.8

## 2016-12-19 IMAGING — DX DG CHEST 2V
2 series · 2 of 2 positions shown · non-contrast
Comparison: 01/31/2015 chest radiograph

CLINICAL DATA: Dyspnea.

EXAM:
CHEST  2 VIEW

[chest lat]
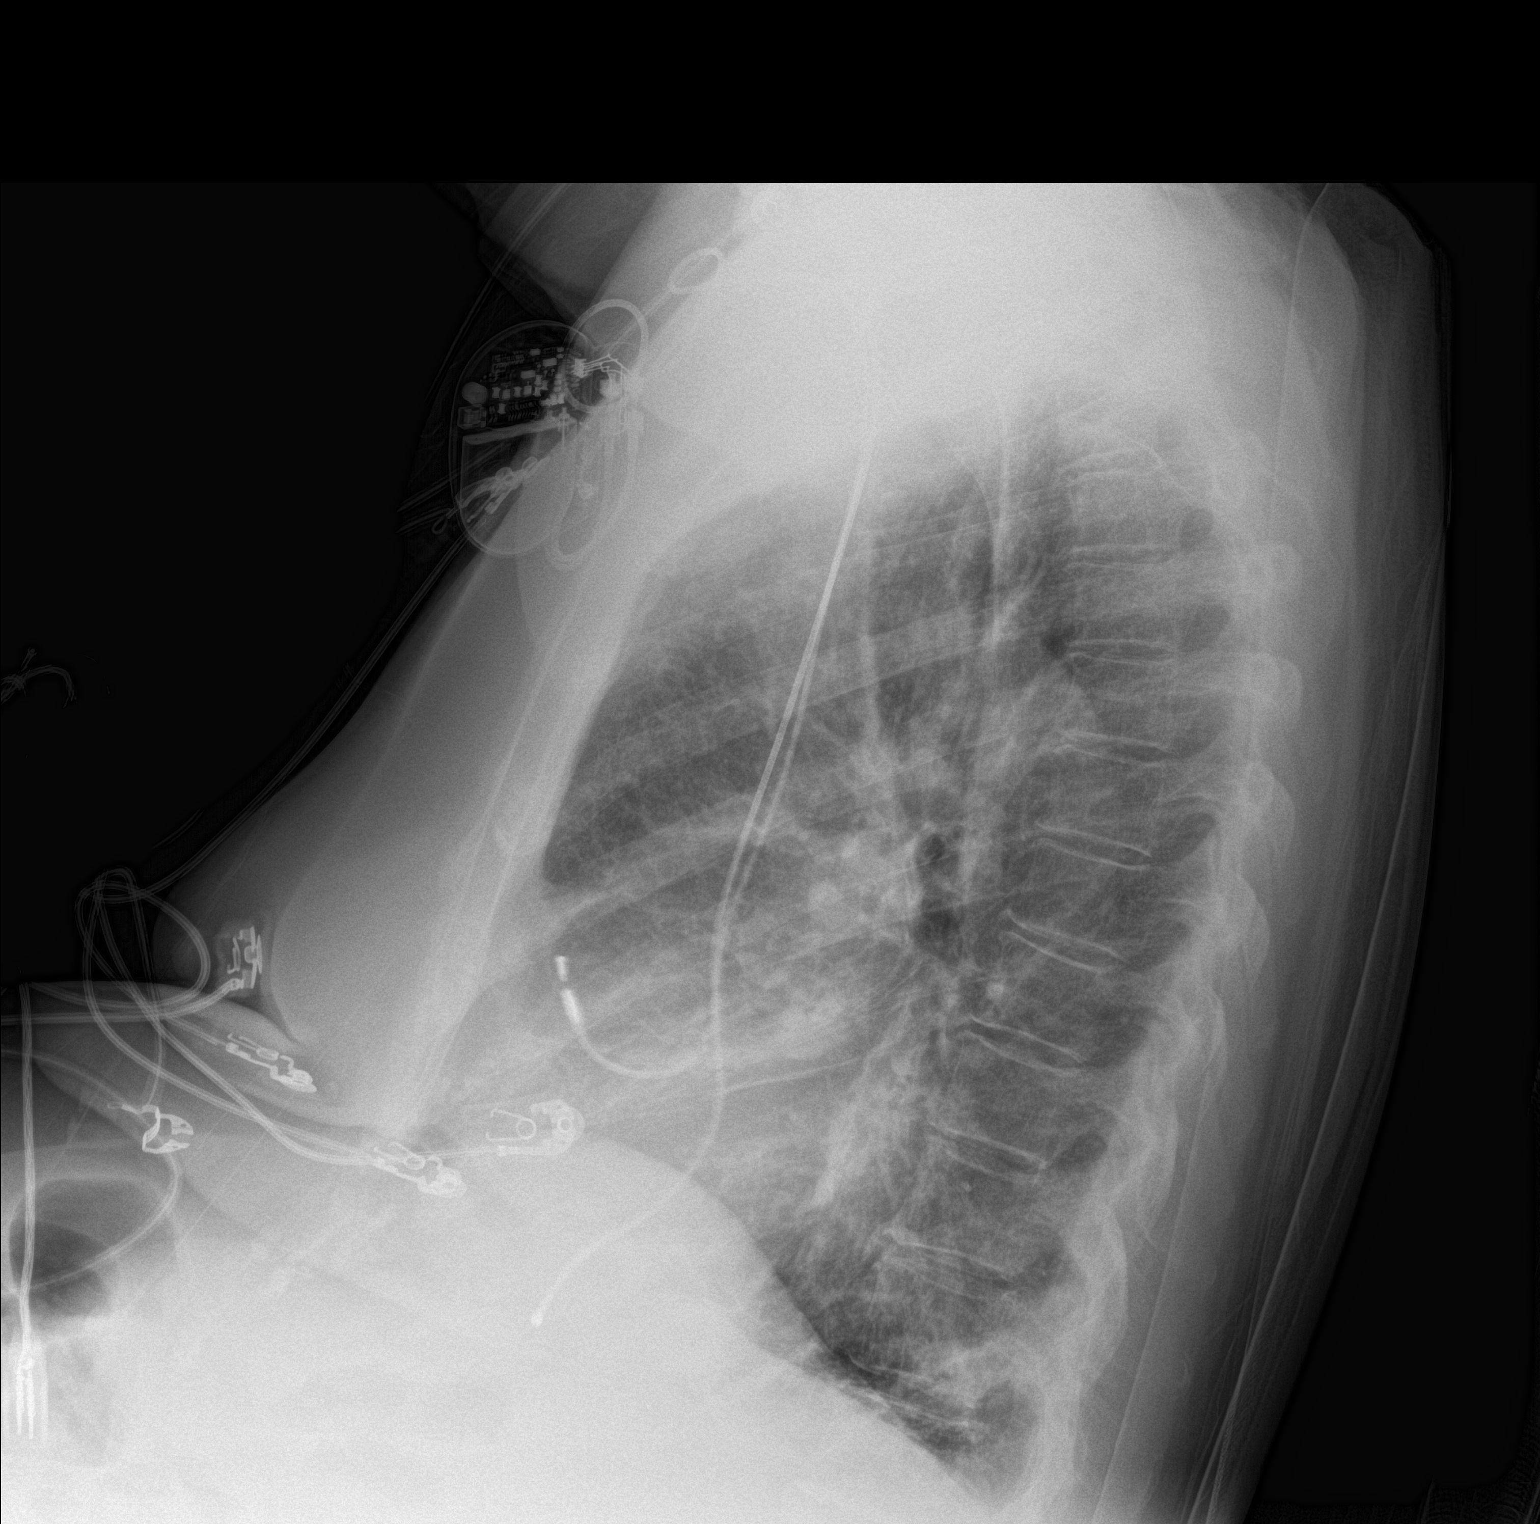

[chest ap]
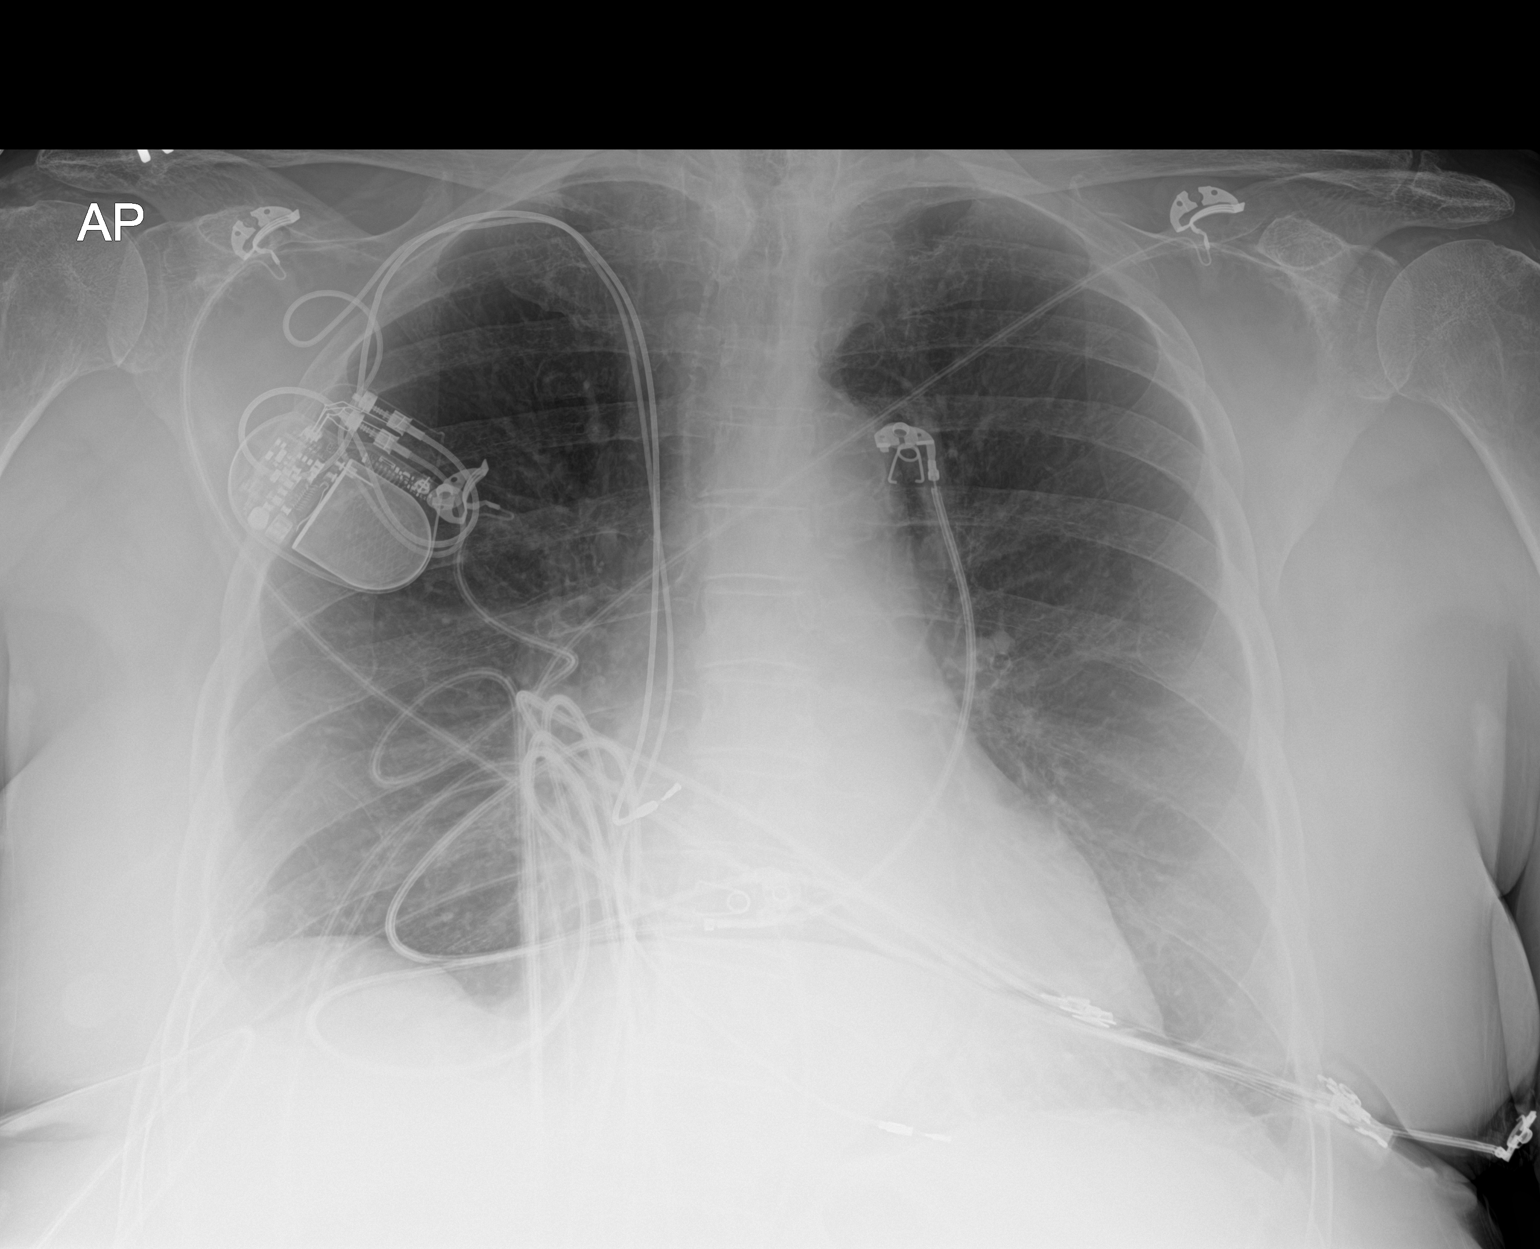

[2 of 2 positions shown; findings below may reference images not displayed]

FINDINGS: Do lead right subclavian pacemaker is stable in configuration with
lead tips overlying the right atrium and right ventricle. Stable
cardiomediastinal silhouette with mild cardiomegaly. No
pneumothorax. No pleural effusion. No pulmonary edema. Mild patchy
opacity at the posterior costophrenic angle on the lateral view.
Minimal curvilinear opacity at the left costophrenic angle on the
frontal view.
IMPRESSION: 1. Mild patchy opacity at the posterior costophrenic angle on the
lateral view, which could represent a mild left lower lobe
pneumonia.
2. Minimal scarring versus atelectasis at the lateral left lung
base.
3. Stable mild cardiomegaly without pulmonary edema.
4. Recommend follow-up post treatment chest radiographs in 6-8
weeks.

## 2016-12-27 ENCOUNTER — Ambulatory Visit (INDEPENDENT_AMBULATORY_CARE_PROVIDER_SITE_OTHER): Payer: Medicare Other | Admitting: Pharmacist

## 2016-12-27 DIAGNOSIS — Z7901 Long term (current) use of anticoagulants: Secondary | ICD-10-CM | POA: Diagnosis not present

## 2016-12-27 DIAGNOSIS — I480 Paroxysmal atrial fibrillation: Secondary | ICD-10-CM

## 2016-12-27 LAB — POCT INR: INR: 3.2

## 2017-01-02 ENCOUNTER — Encounter (HOSPITAL_BASED_OUTPATIENT_CLINIC_OR_DEPARTMENT_OTHER): Payer: Self-pay | Admitting: *Deleted

## 2017-01-02 ENCOUNTER — Emergency Department (HOSPITAL_BASED_OUTPATIENT_CLINIC_OR_DEPARTMENT_OTHER)
Admission: EM | Admit: 2017-01-02 | Discharge: 2017-01-02 | Disposition: A | Discharge status: Home / Self Care | Payer: Medicare Other | Attending: Emergency Medicine | Admitting: Emergency Medicine

## 2017-01-02 ENCOUNTER — Ambulatory Visit (INDEPENDENT_AMBULATORY_CARE_PROVIDER_SITE_OTHER): Payer: Medicare Other | Admitting: Family Medicine

## 2017-01-02 ENCOUNTER — Encounter: Payer: Self-pay | Admitting: Family Medicine

## 2017-01-02 DIAGNOSIS — Z87891 Personal history of nicotine dependence: Secondary | ICD-10-CM | POA: Diagnosis not present

## 2017-01-02 DIAGNOSIS — Z85828 Personal history of other malignant neoplasm of skin: Secondary | ICD-10-CM | POA: Diagnosis not present

## 2017-01-02 DIAGNOSIS — Z950 Presence of cardiac pacemaker: Secondary | ICD-10-CM | POA: Insufficient documentation

## 2017-01-02 DIAGNOSIS — Y999 Unspecified external cause status: Secondary | ICD-10-CM | POA: Insufficient documentation

## 2017-01-02 DIAGNOSIS — Y9302 Activity, running: Secondary | ICD-10-CM | POA: Insufficient documentation

## 2017-01-02 DIAGNOSIS — Z7901 Long term (current) use of anticoagulants: Secondary | ICD-10-CM | POA: Insufficient documentation

## 2017-01-02 DIAGNOSIS — W228XXA Striking against or struck by other objects, initial encounter: Secondary | ICD-10-CM | POA: Diagnosis not present

## 2017-01-02 DIAGNOSIS — I10 Essential (primary) hypertension: Secondary | ICD-10-CM | POA: Diagnosis not present

## 2017-01-02 DIAGNOSIS — Y929 Unspecified place or not applicable: Secondary | ICD-10-CM | POA: Insufficient documentation

## 2017-01-02 DIAGNOSIS — S81812A Laceration without foreign body, left lower leg, initial encounter: Secondary | ICD-10-CM | POA: Diagnosis not present

## 2017-01-02 DIAGNOSIS — E7800 Pure hypercholesterolemia, unspecified: Secondary | ICD-10-CM

## 2017-01-02 DIAGNOSIS — E039 Hypothyroidism, unspecified: Secondary | ICD-10-CM | POA: Insufficient documentation

## 2017-01-02 DIAGNOSIS — S91012A Laceration without foreign body, left ankle, initial encounter: Secondary | ICD-10-CM | POA: Diagnosis not present

## 2017-01-02 DIAGNOSIS — Z79899 Other long term (current) drug therapy: Secondary | ICD-10-CM | POA: Diagnosis not present

## 2017-01-02 DIAGNOSIS — I872 Venous insufficiency (chronic) (peripheral): Secondary | ICD-10-CM

## 2017-01-02 DIAGNOSIS — S8992XA Unspecified injury of left lower leg, initial encounter: Secondary | ICD-10-CM | POA: Diagnosis present

## 2017-01-02 MED ORDER — AMOXICILLIN-POT CLAVULANATE 875-125 MG PO TABS: 1.0000 | ORAL_TABLET | Freq: Two times a day (BID) | ORAL | 0 refills | Status: DC

## 2017-01-02 NOTE — Assessment & Plan Note (Signed)
Cbc

## 2017-01-02 NOTE — ED Notes (Signed)
Pt reports that on Tuesday she was washing dishes and turned around to go to go to the refrigerator when she smacked her lower left leg on her dishwasher door; same opened up and pt was unaware. Pt's leg has what appears to be a skin tear on her left shin.

## 2017-01-02 NOTE — ED Triage Notes (Addendum)
Laceration to her left lower leg. She ran into the dishwasher door. Bleeding controlled. Skin tear noted with fluid filled blister above the skin tear. Foot and lower leg are warm to touch.

## 2017-01-02 NOTE — Assessment & Plan Note (Signed)
Well controlled, no changes to meds. Encouraged heart healthy diet such as the DASH diet and exercise as tolerated.  °

## 2017-01-02 NOTE — Progress Notes (Signed)
Pre visit review using our clinic review tool, if applicable. No additional management support is needed unless otherwise documented below in the visit note. 

## 2017-01-02 NOTE — Progress Notes (Signed)
Patient ID: Jill Washington, female   DOB: Jan 20, 1926, 83 y.o.   MRN: [id number]   Subjective:  I acted as a Education administrator for Jill Washington, [name], [place]   Patient ID: Jill Washington, female    DOB: 05-06-1926, 83 y.o.   MRN: [id number]  Chief Complaint  Patient presents with  . Bruise    R leg.    HPI  Patient is in today for an acute visit. Patient has a huge hematoma on left leg. Injured leg on Tuesday per patient but her son who is with her states it could be as far back as saturday. Hasn't been seen by any other healthcare professional for this concern. No additional concerns noted at this time. She does not endorse anorexia, fevers, chills, malaise, nausea but doe acknowledge minimal sensation in left lower extremity. Denies CP/palp/SOB/HA/congestion/fevers/GI or GU c/o. Taking meds as prescribed  Patient Care Team: Mosie Lukes, MD as PCP - General (Family Medicine) Sanda Klein, MD as Consulting Physician (Cardiology) Lynnell Dike, OD as Consulting Physician (Optometry)   Past Medical History:  Diagnosis Date  . Arthritis   . Blood transfusion   . CHB (complete heart block) ([facility]) 11/10/2014  . Chicken pox as a child  . Colon polyps    hyperplastic  . Critical illness polyneuropathy ([facility])   . Diverticulosis of sigmoid colon 02/05/2007   mild  . Fecal incontinence   . Fibromyalgia   . GERD (gastroesophageal reflux disease)   . H/O measles   . H/O mumps   . Hiatal hernia   . History of atrial fibrillation   . History of chicken pox   . History of empyema of pleura   . History of gallstones   . History of skin cancer    face  . Hypercholesteremia   . Hypertension   . Hypothyroidism   . IBS (irritable bowel syndrome)   . Measles as a child  . Medicare annual wellness visit, subsequent 05/15/2015   Allergies verified: UTD   Immunization Status: Flu vaccine-- NA  Tdap-- 9-10 years ago per patient  PNA-- 09/30/07 (23)  Shingles-- 04/07/14   A/P:  Changes to [place], [place] or  Personal Hx: UTD Pap-- none recent  MMG--declines further MMgs (last 08/12/03 BI-RADS 1: Neg)  Bone Density-- 08/11/03 with Prescott Gum, MD- Normal, declines further testing CCS-- 05/07/12 with Silvano Rusk, MD- polyp removed- no   . Mitral valve prolapse   . Mumps as a child  . Overactive bladder 05/07/2011  . Pacemaker   . Peripheral neuropathy ([place])   . Shingles 8 yrs ago  . Sick sinus syndrome ([place])   . Urinary incontinence   . Venous insufficiency   . Vitamin D deficiency     Past Surgical History:  Procedure Laterality Date  . APPENDECTOMY  1971  . CARDIAC CATHETERIZATION  05/10/1997   Normal coronaries, MVP  . CERVICAL SPINE SURGERY    . CHOLECYSTECTOMY  03/2005   by Dr. Dalbert Batman  . COLONOSCOPY  02/05/2007,02/05/05   Dr. Silvano Rusk  . ESOPHAGOGASTRODUODENOSCOPY  02/05/2005,01/22/90   Dr. Silvano Rusk, Dr. Rachelle Hora  . NM MYOCAR PERF WALL MOTION  08/24/2010   Normal  . PACEMAKER GENERATOR CHANGE N/A 02/25/2012   Procedure: PACEMAKER GENERATOR CHANGE;  Surgeon: Sanda Klein, MD;  Location: [facility];  Service: Cardiovascular;  Laterality: N/A;  . pacemaker implanted  05/11/97,08/02/05   [facility]  . TONSILLECTOMY    . TOTAL ABDOMINAL HYSTERECTOMY W/ BILATERAL SALPINGOOPHORECTOMY  Family History  Problem Relation Age of Onset  . Pancreatic cancer Mother   . Diabetes Mother 60    type 2  . Stomach cancer Maternal Aunt   . Heart attack Brother   . Diabetes Brother     type 2  . Gallbladder disease Father   . Diabetes Father     type 2  . Diabetes Brother     X 2   . Cancer Sister 80    skin ca on face  . Diabetes Sister     type 2  . Colon cancer    . Diabetes Daughter     type 2  . Hypertension Daughter   . Alcohol abuse Daughter   . Diabetes Son     type 2  . Diabetes Sister     type 1  . Hypertension Sister     Social History   Social History  . Marital status: Widowed    Spouse name: N/A  . Number of children: 3  . Years of education: N/A    Occupational History  . retired Retired   Social History Main Topics  . Smoking status: Former Smoker    Packs/day: 2.00    Years: 6.00    Types: Cigarettes    Quit date: 10/15/1951  . Smokeless tobacco: Never Used  . Alcohol use No  . Drug use: No  . Sexual activity: Not on file   Other Topics Concern  . Not on file   Social History Narrative   She is a widow, one son to daughters. She is retired.    Outpatient Medications Prior to Visit  Medication Sig Dispense Refill  . amiodarone (PACERONE) 200 MG tablet Take 0.5 tablets (100 mg total) by mouth daily. 45 tablet 3  . atenolol (TENORMIN) 50 MG tablet TAKE ONE AND ONE-HALF TABLETS DAILY 135 tablet 3  . cholecalciferol (VITAMIN D) 1000 UNITS tablet Take 1,000 Units by mouth daily.    . furosemide (LASIX) 20 MG tablet TAKE 1 TABLET DAILY 90 tablet 0  . gabapentin (NEURONTIN) 300 MG capsule TAKE 1 CAPSULE TWICE A DAY 180 capsule 1  . levothyroxine (SYNTHROID, LEVOTHROID) 137 MCG tablet TAKE 1 TABLET DAILY 90 tablet 2  . Multiple Vitamin (MULTIVITAMIN WITH MINERALS) TABS tablet Take 1 tablet by mouth daily.    Marland Kitchen MYRBETRIQ 50 MG TB24 tablet Take 1 tablet by mouth daily.    Marland Kitchen omeprazole (PRILOSEC) 40 MG capsule TAKE 1 CAPSULE DAILY 90 capsule 1  . polyvinyl alcohol (ARTIFICIAL TEARS) 1.4 % ophthalmic solution Place 1 drop into both eyes 3 (three) times daily as needed for dry eyes.    . pravastatin (PRAVACHOL) 40 MG tablet TAKE 1 TABLET DAILY 90 tablet 1  . traMADol (ULTRAM) 50 MG tablet Take 1 tablet (50 mg total) by mouth daily as needed for severe pain. 90 tablet 0  . trimethoprim (TRIMPEX) 100 MG tablet Take 100 mg by mouth daily.    . valsartan-hydrochlorothiazide (DIOVAN-HCT) 80-12.5 MG tablet TAKE 1 TABLET DAILY 90 tablet 3  . warfarin (COUMADIN) 2 MG tablet TAKE 1 TABLET DAILY OR AS DIRECTED 90 tablet 1  . albuterol (PROVENTIL HFA;VENTOLIN HFA) 108 (90 Base) MCG/ACT inhaler Inhale 2 puffs into the lungs every 6 (six) hours  as needed for wheezing or shortness of breath. (Patient not taking: Reported on 01/02/2017) 1 Inhaler 2   No facility-administered medications prior to visit.     Allergies  Allergen Reactions  . Iodinated Diagnostic Agents Other (  See Comments)    Pt was not told what the reaction was  . Cymbalta [Duloxetine Hcl] Other (See Comments)    Could not think straight  . Other Itching    Dial Soap    Review of Systems  Constitutional: Negative for fever and malaise/fatigue.  HENT: Negative for congestion.   Eyes: Negative for blurred vision.  Respiratory: Negative for cough and shortness of breath.   Cardiovascular: Negative for chest pain, palpitations and leg swelling.  Gastrointestinal: Negative for vomiting.  Musculoskeletal: Negative for back pain.       Huge hematoma on L leg.   Skin: Positive for rash.  Neurological: Negative for loss of consciousness and headaches.       Objective:    Physical Exam  Constitutional: She is oriented to person, place, and time. She appears well-developed and well-nourished. No distress.  HENT:  Head: Normocephalic and atraumatic.  Eyes: Conjunctivae are normal.  Neck: Normal range of motion. No thyromegaly present.  Cardiovascular: Normal rate.   Murmur heard. Pulmonary/Chest: Effort normal and breath sounds normal. She has no wheezes.  Abdominal: Soft. Bowel sounds are normal. There is no tenderness.  Musculoskeletal: She exhibits edema and deformity.  LLE gash with serous drainage, lateral aspect anterior leg, 3 cm wide surrounded by edema and ecchymosis. Foot cold and violaceous throughout  Neurological: She is alert and oriented to person, place, and time.  Skin: Skin is warm and dry. She is not diaphoretic.  Psychiatric: She has a normal mood and affect.    BP (!) 143/61 (BP Location: Left Arm, Patient Position: Supine, Cuff Size: Large)   Pulse 72   Temp 98.2 F (36.8 C) (Oral)   Ht 5\' 6"  (1.676 m)   Wt 197 lb 9.6 oz (89.6 kg)    SpO2 98% Comment: RA  BMI 31.89 kg/m  Wt Readings from Last 3 Encounters:  01/02/17 197 lb 9.6 oz (89.6 kg)  10/23/16 195 lb 12.8 oz (88.8 kg)  10/10/16 197 lb 3.2 oz (89.4 kg)   BP Readings from Last 3 Encounters:  01/02/17 (!) 143/61  10/23/16 (!) 142/82  10/10/16 124/68     Immunization History  Administered Date(s) Administered  . Influenza Split 07/17/2011, 07/31/2012, 07/28/2013  . Influenza Whole 10/17/2009, 08/27/2010  . Influenza,inj,Quad PF,36+ Mos 08/08/2015  . Influenza-Unspecified 07/04/2016  . Pneumococcal Conjugate-13 10/30/2015  . Pneumococcal Polysaccharide-23 09/30/2007  . Tdap 10/30/2015  . Zoster 04/07/2014    Health Maintenance  Topic Date Due  . Samul Dada  10/29/2025  . INFLUENZA VACCINE  Completed  . DEXA SCAN  Completed  . PNA vac Low Risk Adult  Completed    Lab Results  Component Value Date   WBC 5.1 10/25/2016   HGB 12.0 10/25/2016   HCT 35.4 (L) 10/25/2016   PLT 215.0 10/25/2016   GLUCOSE 92 10/25/2016   CHOL 158 10/25/2016   TRIG 192.0 (H) 10/25/2016   HDL 38.90 (L) 10/25/2016   LDLDIRECT 77.0 04/28/2015   LDLCALC 80 10/25/2016   ALT 9 10/25/2016   AST 17 10/25/2016   NA 139 10/25/2016   K 4.9 10/25/2016   CL 110 10/25/2016   CREATININE 1.85 (H) 10/25/2016   BUN 24 (H) 10/25/2016   CO2 25 10/25/2016   TSH 1.59 10/25/2016   INR 3.2 12/27/2016   HGBA1C 5.3 08/02/2015    Lab Results  Component Value Date   TSH 1.59 10/25/2016   Lab Results  Component Value Date   WBC 5.1 10/25/2016  HGB 12.0 10/25/2016   HCT 35.4 (L) 10/25/2016   MCV 91.5 10/25/2016   PLT 215.0 10/25/2016   Lab Results  Component Value Date   NA 139 10/25/2016   K 4.9 10/25/2016   CO2 25 10/25/2016   GLUCOSE 92 10/25/2016   BUN 24 (H) 10/25/2016   CREATININE 1.85 (H) 10/25/2016   BILITOT 0.4 10/25/2016   ALKPHOS 54 10/25/2016   AST 17 10/25/2016   ALT 9 10/25/2016   PROT 6.5 10/25/2016   ALBUMIN 3.8 10/25/2016   CALCIUM 9.6 10/25/2016     ANIONGAP 14 08/09/2015   GFR 27.32 (L) 10/25/2016   Lab Results  Component Value Date   CHOL 158 10/25/2016   Lab Results  Component Value Date   HDL 38.90 (L) 10/25/2016   Lab Results  Component Value Date   LDLCALC 80 10/25/2016   Lab Results  Component Value Date   TRIG 192.0 (H) 10/25/2016   Lab Results  Component Value Date   CHOLHDL 4 10/25/2016   Lab Results  Component Value Date   HGBA1C 5.3 08/02/2015         Assessment & Plan:   Problem List Items Addressed This Visit    HYPERCHOLESTEROLEMIA    Encouraged heart healthy diet, increase exercise, avoid trans fats, consider a krill oil cap daily      Essential hypertension    Well controlled, no changes to meds. Encouraged heart healthy diet such as the DASH diet and exercise as tolerated.       Venous (peripheral) insufficiency    Longstanding insufficieny b/l      Laceration of ankle, complicated, left, initial encounter      I have discontinued Ms. Trott's albuterol. I am also having her maintain her multivitamin with minerals, cholecalciferol, polyvinyl alcohol, trimethoprim, MYRBETRIQ, amiodarone, pravastatin, omeprazole, gabapentin, traMADol, furosemide, levothyroxine, warfarin, valsartan-hydrochlorothiazide, and atenolol.  No orders of the defined types were placed in this encounter.   CMA served as Education administrator during this visit. History, Physical and Plan performed by medical provider. Documentation and orders reviewed and attested to.  Jill Homans, MD

## 2017-01-02 NOTE — ED Provider Notes (Signed)
[facility] DEPT [facility] Provider Note   CSN: [id number] Arrival date & time: 01/02/17  0272   By signing my name below, I, Neta Mends, attest that this documentation has been prepared under the direction and in the presence of Etta Quill, NP. Electronically Signed: Neta Mends, ED Scribe. 01/02/2017. 6:33 PM.   History   Chief Complaint Chief Complaint  Patient presents with  . Laceration    The history is provided by the patient. No language interpreter was used.   HPI Comments: Jill Washington is a 83 y.o. female who presents to the Emergency Department complaining of a wound sustained to the lower left leg that occurred 2 days ago. Pt reports that she ran into the dishwasher door. Pt notes a blister and increased bruising to where her leg hit the door. Pt states that she has had neuropathy in her legs for 2 years, which have caused her to have difficulty moving her toes and her skin has become black on her shins. Pt does not have DM. She reports that she has had to go to wound care for a wound on her right leg previously, and states that she sustains wounds easily. Pt was able to control the bleeding.   Past Medical History:  Diagnosis Date  . Arthritis   . Blood transfusion   . CHB (complete heart block) ([facility]) 11/10/2014  . Chicken pox as a child  . Colon polyps    hyperplastic  . Critical illness polyneuropathy ([facility])   . Diverticulosis of sigmoid colon 02/05/2007   mild  . Fecal incontinence   . Fibromyalgia   . GERD (gastroesophageal reflux disease)   . H/O measles   . H/O mumps   . Hiatal hernia   . History of atrial fibrillation   . History of chicken pox   . History of empyema of pleura   . History of gallstones   . History of skin cancer    face  . Hypercholesteremia   . Hypertension   . Hypothyroidism   . IBS (irritable bowel syndrome)   . Measles as a child  . Medicare annual wellness visit, subsequent 05/15/2015   Allergies verified: UTD    Immunization Status: Flu vaccine-- NA  Tdap-- 9-10 years ago per patient  PNA-- 09/30/07 (23)  Shingles-- 04/07/14   A/P:  Changes to [place], [place] or Personal Hx: UTD Pap-- none recent  MMG--declines further MMgs (last 08/12/03 BI-RADS 1: Neg)  Bone Density-- 08/11/03 with Prescott Gum, MD- Normal, declines further testing CCS-- 05/07/12 with Silvano Rusk, MD- polyp removed- no   . Mitral valve prolapse   . Mumps as a child  . Overactive bladder 05/07/2011  . Pacemaker   . Peripheral neuropathy ([place])   . Shingles 8 yrs ago  . Sick sinus syndrome ([place])   . Urinary incontinence   . Venous insufficiency   . Vitamin D deficiency     Patient Active Problem List   Diagnosis Date Noted  . Laceration of ankle, complicated, left, initial encounter 01/02/2017  . SSS (sick sinus syndrome) ([place] [facility]) 10/23/2016  . Knee pain, bilateral 05/31/2016  . Medicare annual wellness visit, subsequent 05/15/2015  . CHB (complete heart block) ([facility]) 11/10/2014  . Pedal edema 04/10/2014  . History of shingles   . Long term current use of anticoagulant therapy 01/30/2013  . Pacemaker -  [place] leads implanted in 1998 generator change 2006 and 2013 07/31/2011  . Low back pain 05/07/2011  . Overactive bladder  05/07/2011  . B12 deficiency 01/22/2011  . Anemia 01/22/2011  . Irritable bowel syndrome 04/19/2010  . Vitamin D deficiency 10/17/2009  . HYPERCHOLESTEROLEMIA 10/17/2009  . Critical illness polyneuropathy ([place]) 06/23/2008  . Atrial fibrillation ([place]) 06/23/2008  . Venous (peripheral) insufficiency 06/23/2008  . History of colon polyps 02/29/2008  . Hypothyroidism 02/29/2008  . PERIPHERAL NEUROPATHY 02/29/2008  . Essential hypertension 02/29/2008  . MITRAL VALVE PROLAPSE 02/29/2008  . History of sick sinus syndrome 02/29/2008  . HIATAL HERNIA 02/29/2008  . FIBROMYALGIA 02/29/2008  . SKIN CANCER, HX OF 02/29/2008    Past Surgical History:  Procedure Laterality Date  . APPENDECTOMY  1971  . CARDIAC  CATHETERIZATION  05/10/1997   Normal coronaries, MVP  . CERVICAL SPINE SURGERY    . CHOLECYSTECTOMY  03/2005   by Dr. Dalbert Batman  . COLONOSCOPY  02/05/2007,02/05/05   Dr. Silvano Rusk  . ESOPHAGOGASTRODUODENOSCOPY  02/05/2005,01/22/90   Dr. Silvano Rusk, Dr. Rachelle Hora  . NM MYOCAR PERF WALL MOTION  08/24/2010   Normal  . PACEMAKER GENERATOR CHANGE N/A 02/25/2012   Procedure: PACEMAKER GENERATOR CHANGE;  Surgeon: Sanda Klein, MD;  Location: [facility];  Service: Cardiovascular;  Laterality: N/A;  . pacemaker implanted  05/11/97,08/02/05   [facility]  . TONSILLECTOMY    . TOTAL ABDOMINAL HYSTERECTOMY W/ BILATERAL SALPINGOOPHORECTOMY      OB History    No data available       Home Medications    Prior to Admission medications   Medication Sig Start Date End Date Taking? Authorizing Provider  amiodarone (PACERONE) 200 MG tablet Take 0.5 tablets (100 mg total) by mouth daily. 02/20/16   Mihai Croitoru, MD  atenolol (TENORMIN) 50 MG tablet TAKE ONE AND ONE-HALF TABLETS DAILY 11/26/16   Mihai Croitoru, MD  cholecalciferol (VITAMIN D) 1000 UNITS tablet Take 1,000 Units by mouth daily.    Historical Provider, MD  furosemide (LASIX) 20 MG tablet TAKE 1 TABLET DAILY 10/08/16   Mosie Lukes, MD  gabapentin (NEURONTIN) 300 MG capsule TAKE 1 CAPSULE TWICE A DAY 09/25/16   Mosie Lukes, MD  levothyroxine (SYNTHROID, LEVOTHROID) 137 MCG tablet TAKE 1 TABLET DAILY 10/08/16   Mosie Lukes, MD  Multiple Vitamin (MULTIVITAMIN WITH MINERALS) TABS tablet Take 1 tablet by mouth daily.    Historical Provider, MD  MYRBETRIQ 50 MG TB24 tablet Take 1 tablet by mouth daily. 12/28/15   Historical Provider, MD  omeprazole (PRILOSEC) 40 MG capsule TAKE 1 CAPSULE DAILY 09/25/16   Mosie Lukes, MD  polyvinyl alcohol (ARTIFICIAL TEARS) 1.4 % ophthalmic solution Place 1 drop into both eyes 3 (three) times daily as needed for dry eyes.    Historical Provider, MD  pravastatin (PRAVACHOL) 40 MG tablet TAKE 1 TABLET  DAILY 08/03/16   Mosie Lukes, MD  traMADol (ULTRAM) 50 MG tablet Take 1 tablet (50 mg total) by mouth daily as needed for severe pain. 09/26/16   Mosie Lukes, MD  trimethoprim (TRIMPEX) 100 MG tablet Take 100 mg by mouth daily. 09/21/15   Historical Provider, MD  valsartan-hydrochlorothiazide (DIOVAN-HCT) 80-12.5 MG tablet TAKE 1 TABLET DAILY 11/26/16   Mihai Croitoru, MD  warfarin (COUMADIN) 2 MG tablet TAKE 1 TABLET DAILY OR AS DIRECTED 10/21/16   Sanda Klein, MD    Family History Family History  Problem Relation Age of Onset  . Pancreatic cancer Mother   . Diabetes Mother 36    type 2  . Heart attack Brother   . Diabetes Brother  type 2  . Gallbladder disease Father   . Diabetes Father     type 2  . Diabetes Brother     X 2   . Cancer Sister 80    skin ca on face  . Diabetes Sister     type 2  . Diabetes Daughter     type 2  . Hypertension Daughter   . Alcohol abuse Daughter   . Diabetes Son     type 2  . Diabetes Sister     type 1  . Hypertension Sister   . Stomach cancer Maternal Aunt   . Colon cancer      Social History Social History  Substance Use Topics  . Smoking status: Former Smoker    Packs/day: 2.00    Years: 6.00    Types: Cigarettes    Quit date: 10/15/1951  . Smokeless tobacco: Never Used  . Alcohol use No     Allergies   Iodinated diagnostic agents; Cymbalta [duloxetine hcl]; and Other   Review of Systems Review of Systems  Constitutional: Negative for fever.  Skin: Positive for color change and wound.  All other systems reviewed and are negative.    Physical Exam Updated Vital Signs BP (!) 118/51   Pulse 70   Temp 98 F (36.7 C) (Oral)   Resp 20   SpO2 98%   Physical Exam  Constitutional: She appears well-developed and well-nourished. No distress.  HENT:  Head: Normocephalic and atraumatic.  Eyes: Conjunctivae are normal.  Cardiovascular: Normal rate.   Pulmonary/Chest: Effort normal.  Abdominal: She exhibits no  distension.  Neurological: She is alert.  Skin: Skin is warm and dry.  See photo  Psychiatric: She has a normal mood and affect.  Nursing note and vitals reviewed.      ED Treatments / Results  DIAGNOSTIC STUDIES:  Oxygen Saturation is 98% on RA, normal by my interpretation.    COORDINATION OF CARE:  6:32 PM Discussed treatment plan with pt at bedside and pt agreed to plan.   Labs (all labs ordered are listed, but only abnormal results are displayed) Labs Reviewed - No data to display  EKG  EKG Interpretation None       Radiology No results found.  Procedures Procedures (including critical care time)  Medications Ordered in ED Medications - No data to display   Initial Impression / Assessment and Plan / ED Course  I have reviewed the triage vital signs and the nursing notes.  Pertinent labs & imaging results that were available during my care of the patient were reviewed by me and considered in my medical decision making (see chart for details).   Patient discussed with and seen by Dr. Johnney Killian.  Laceration occurred 2 days ago. Wound care provided. Discussed laceration care with pt and answered questions. Patient started on augmentin. Pt is hemodynamically stable with no complaints prior to dc.      Final Clinical Impressions(s) / ED Diagnoses   Final diagnoses:  Laceration of left lower extremity, initial encounter    New Prescriptions Discharge Medication List as of 01/02/2017  7:26 PM    START taking these medications   Details  amoxicillin-clavulanate (AUGMENTIN) 875-125 MG tablet Take 1 tablet by mouth 2 (two) times daily. One po bid x 7 days, Starting Thu 01/02/2017, Print       I personally performed the services described in this documentation, which was scribed in my presence. The recorded information has been reviewed and is accurate.  Etta Quill, NP 01/03/17 [id number]    Charlesetta Shanks, MD 01/07/17 [id number]

## 2017-01-02 NOTE — Patient Instructions (Signed)
Peripheral Vascular Disease Peripheral vascular disease (PVD) is a disease of the blood vessels that are not part of your heart and brain. A simple term for PVD is poor circulation. In most cases, PVD narrows the blood vessels that carry blood from your heart to the rest of your body. This can result in a decreased supply of blood to your arms, legs, and internal organs, like your stomach or kidneys. However, it most often affects a person's lower legs and feet. There are two types of PVD.  Organic PVD. This is the more common type. It is caused by damage to the structure of blood vessels.  Functional PVD. This is caused by conditions that make blood vessels contract and tighten (spasm).  Without treatment, PVD tends to get worse over time. PVD can also lead to acute ischemic limb. This is when an arm or limb suddenly has trouble getting enough blood. This is a medical emergency. What are the causes? Each type of PVD has many different causes. The most common cause of PVD is buildup of a fatty material (plaque) inside of your arteries (atherosclerosis). Small amounts of plaque can break off from the walls of the blood vessels and become lodged in a smaller artery. This blocks blood flow and can cause acute ischemic limb. Other common causes of PVD include:  Blood clots that form inside of blood vessels.  Injuries to blood vessels.  Diseases that cause inflammation of blood vessels or cause blood vessel spasms.  Health behaviors and health history that increase your risk of developing PVD.  What increases the risk? You may have a greater risk of PVD if you:  Have a family history of PVD.  Have certain medical conditions, including: ? High cholesterol. ? Diabetes. ? High blood pressure (hypertension). ? Coronary heart disease. ? Past problems with blood clots. ? Past injury, such as burns or a broken bone. These may have damaged blood vessels in your limbs. ? Buerger disease. This is  caused by inflamed blood vessels in your hands and feet. ? Some forms of arthritis. ? Rare birth defects that affect the arteries in your legs.  Use tobacco.  Do not get enough exercise.  Are obese.  Are age 50 or older.  What are the signs or symptoms? PVD may cause many different symptoms. Your symptoms depend on what part of your body is not getting enough blood. Some common signs and symptoms include:  Cramps in your lower legs. This may be a symptom of poor leg circulation (claudication).  Pain and weakness in your legs while you are physically active that goes away when you rest (intermittent claudication).  Leg pain when at rest.  Leg numbness, tingling, or weakness.  Coldness in a leg or foot, especially when compared with the other leg.  Skin or hair changes. These can include: ? Hair loss. ? Shiny skin. ? Pale or bluish skin. ? Thick toenails.  Inability to get or maintain an erection (erectile dysfunction).  People with PVD are more prone to developing ulcers and sores on their toes, feet, or legs. These may take longer than normal to heal. How is this diagnosed? Your health care provider may diagnose PVD from your signs and symptoms. The health care provider will also do a physical exam. You may have tests to find out what is causing your PVD and determine its severity. Tests may include:  Blood pressure recordings from your arms and legs and measurements of the strength of your pulses (  pulse volume recordings).  Imaging studies using sound waves to take pictures of the blood flow through your blood vessels (Doppler ultrasound).  Injecting a dye into your blood vessels before having imaging studies using: ? X-rays (angiogram or arteriogram). ? Computer-generated X-rays (CT angiogram). ? A powerful electromagnetic field and a computer (magnetic resonance angiogram or MRA).  How is this treated? Treatment for PVD depends on the cause of your condition and the  severity of your symptoms. It also depends on your age. Underlying causes need to be treated and controlled. These include long-lasting (chronic) conditions, such as diabetes, high cholesterol, and high blood pressure. You may need to first try making lifestyle changes and taking medicines. Surgery may be needed if these do not work. Lifestyle changes may include:  Quitting smoking.  Exercising regularly.  Following a low-fat, low-cholesterol diet.  Medicines may include:  Blood thinners to prevent blood clots.  Medicines to improve blood flow.  Medicines to improve your blood cholesterol levels.  Surgical procedures may include:  A procedure that uses an inflated balloon to open a blocked artery and improve blood flow (angioplasty).  A procedure to put in a tube (stent) to keep a blocked artery open (stent implant).  Surgery to reroute blood flow around a blocked artery (peripheral bypass surgery).  Surgery to remove dead tissue from an infected wound on the affected limb.  Amputation. This is surgical removal of the affected limb. This may be necessary in cases of acute ischemic limb that are not improved through medical or surgical treatments.  Follow these instructions at home:  Take medicines only as directed by your health care provider.  Do not use any tobacco products, including cigarettes, chewing tobacco, or electronic cigarettes. If you need help quitting, ask your health care provider.  Lose weight if you are overweight, and maintain a healthy weight as directed by your health care provider.  Eat a diet that is low in fat and cholesterol. If you need help, ask your health care provider.  Exercise regularly. Ask your health care provider to suggest some good activities for you.  Use compression stockings or other mechanical devices as directed by your health care provider.  Take good care of your feet. ? Wear comfortable shoes that fit well. ? Check your feet  often for any cuts or sores. Contact a health care provider if:  You have cramps in your legs while walking.  You have leg pain when you are at rest.  You have coldness in a leg or foot.  Your skin changes.  You have erectile dysfunction.  You have cuts or sores on your feet that are not healing. Get help right away if:  Your arm or leg turns cold and blue.  Your arms or legs become red, warm, swollen, painful, or numb.  You have chest pain or trouble breathing.  You suddenly have weakness in your face, arm, or leg.  You become very confused or lose the ability to speak.  You suddenly have a very bad headache or lose your vision. This information is not intended to replace advice given to you by your health care provider. Make sure you discuss any questions you have with your health care provider. Document Released: 11/07/2004 Document Revised: 03/07/2016 Document Reviewed: 03/10/2014 Elsevier Interactive Patient Education  2017 [employer].  

## 2017-01-02 NOTE — Assessment & Plan Note (Signed)
Encouraged heart healthy diet, increase exercise, avoid trans fats, consider a krill oil cap daily 

## 2017-01-02 NOTE — Assessment & Plan Note (Signed)
Longstanding insufficieny b/l

## 2017-01-02 NOTE — ED Notes (Signed)
When pt was placed on the stretcher in the treatment room her legs were elevated on the bed. Dressing saturated with serous anginous fluid. Left leg cooler to touch. Able to move both feet.

## 2017-01-03 ENCOUNTER — Telehealth: Payer: Self-pay

## 2017-01-03 ENCOUNTER — Other Ambulatory Visit: Payer: Self-pay | Admitting: Family Medicine

## 2017-01-03 DIAGNOSIS — R609 Edema, unspecified: Secondary | ICD-10-CM

## 2017-01-03 NOTE — Telephone Encounter (Signed)
Spoke with Patient today, 01/03/2017 at approximately 2:10pm. Inquired as to how the Patient was feeling since the Acute visit yesterday. Patient stated that she was feeling better and went to the grocery store to get her medications. Patient stated that she had an appointment with Wound Care on Tuesday, 01/07/2017. Per Dr. Charlett Blake, Patient was asked if she would be amicable to Dr. Charlett Blake ordering tests to check the circulations on her legs. Patient agreed. Patient was advised the if she has any problems with her legs over the weekend to go ASAP to the nearest Emergency Room. Patient verbalized understanding.

## 2017-01-05 NOTE — Telephone Encounter (Signed)
Have ordered vascular studies to further investigate

## 2017-01-07 DIAGNOSIS — I10 Essential (primary) hypertension: Secondary | ICD-10-CM | POA: Diagnosis not present

## 2017-01-07 DIAGNOSIS — L97822 Non-pressure chronic ulcer of other part of left lower leg with fat layer exposed: Secondary | ICD-10-CM | POA: Diagnosis not present

## 2017-01-07 DIAGNOSIS — I4891 Unspecified atrial fibrillation: Secondary | ICD-10-CM | POA: Diagnosis not present

## 2017-01-07 DIAGNOSIS — Z950 Presence of cardiac pacemaker: Secondary | ICD-10-CM | POA: Diagnosis not present

## 2017-01-07 DIAGNOSIS — I890 Lymphedema, not elsewhere classified: Secondary | ICD-10-CM | POA: Diagnosis not present

## 2017-01-07 DIAGNOSIS — E039 Hypothyroidism, unspecified: Secondary | ICD-10-CM | POA: Diagnosis not present

## 2017-01-07 DIAGNOSIS — I872 Venous insufficiency (chronic) (peripheral): Secondary | ICD-10-CM | POA: Diagnosis not present

## 2017-01-08 ENCOUNTER — Other Ambulatory Visit: Payer: Self-pay | Admitting: Family Medicine

## 2017-01-08 ENCOUNTER — Ambulatory Visit (HOSPITAL_BASED_OUTPATIENT_CLINIC_OR_DEPARTMENT_OTHER)
Admission: RE | Admit: 2017-01-08 | Discharge: 2017-01-08 | Disposition: A | Discharge status: Home / Self Care | Payer: Medicare Other | Source: Ambulatory Visit | Attending: Family Medicine | Admitting: Family Medicine

## 2017-01-08 DIAGNOSIS — M7989 Other specified soft tissue disorders: Secondary | ICD-10-CM | POA: Insufficient documentation

## 2017-01-08 DIAGNOSIS — M7122 Synovial cyst of popliteal space [Baker], left knee: Secondary | ICD-10-CM | POA: Diagnosis not present

## 2017-01-08 DIAGNOSIS — S81802D Unspecified open wound, left lower leg, subsequent encounter: Secondary | ICD-10-CM

## 2017-01-08 DIAGNOSIS — S8992XA Unspecified injury of left lower leg, initial encounter: Secondary | ICD-10-CM | POA: Diagnosis not present

## 2017-01-08 DIAGNOSIS — M79605 Pain in left leg: Secondary | ICD-10-CM | POA: Diagnosis not present

## 2017-01-08 DIAGNOSIS — R609 Edema, unspecified: Secondary | ICD-10-CM | POA: Diagnosis not present

## 2017-01-08 DIAGNOSIS — R600 Localized edema: Secondary | ICD-10-CM | POA: Diagnosis not present

## 2017-01-08 NOTE — Telephone Encounter (Signed)
Please amend the arterial study

## 2017-01-09 DIAGNOSIS — I890 Lymphedema, not elsewhere classified: Secondary | ICD-10-CM | POA: Diagnosis not present

## 2017-01-09 DIAGNOSIS — I872 Venous insufficiency (chronic) (peripheral): Secondary | ICD-10-CM | POA: Diagnosis not present

## 2017-01-09 DIAGNOSIS — L97822 Non-pressure chronic ulcer of other part of left lower leg with fat layer exposed: Secondary | ICD-10-CM | POA: Diagnosis not present

## 2017-01-09 NOTE — Telephone Encounter (Signed)
No symptoms at this time. Wound care appt. Went really well, they changed her dressing and they said it looked really good.  She has one antibiotic pill left and thinks it has really helped. She states no further imaging was ordered and she states she does not want anything else ordered at this time.

## 2017-01-09 NOTE — Telephone Encounter (Signed)
Please check with her and see how she is feeling any increased pain or swelling, any fevers, CP or SOB? How did would management appt go? Did they order any imaging? If they did just do that if they did not I spoke with radiology about an arterial study I can order her at [facility] [address].

## 2017-01-14 DIAGNOSIS — I872 Venous insufficiency (chronic) (peripheral): Secondary | ICD-10-CM | POA: Diagnosis not present

## 2017-01-14 DIAGNOSIS — I890 Lymphedema, not elsewhere classified: Secondary | ICD-10-CM | POA: Diagnosis not present

## 2017-01-14 DIAGNOSIS — L97822 Non-pressure chronic ulcer of other part of left lower leg with fat layer exposed: Secondary | ICD-10-CM | POA: Diagnosis not present

## 2017-01-20 ENCOUNTER — Ambulatory Visit (INDEPENDENT_AMBULATORY_CARE_PROVIDER_SITE_OTHER): Payer: Medicare Other | Admitting: Pharmacist

## 2017-01-20 DIAGNOSIS — I872 Venous insufficiency (chronic) (peripheral): Secondary | ICD-10-CM | POA: Diagnosis not present

## 2017-01-20 DIAGNOSIS — Z7901 Long term (current) use of anticoagulants: Secondary | ICD-10-CM | POA: Diagnosis not present

## 2017-01-20 DIAGNOSIS — L97822 Non-pressure chronic ulcer of other part of left lower leg with fat layer exposed: Secondary | ICD-10-CM | POA: Diagnosis not present

## 2017-01-20 DIAGNOSIS — I890 Lymphedema, not elsewhere classified: Secondary | ICD-10-CM | POA: Diagnosis not present

## 2017-01-20 DIAGNOSIS — I480 Paroxysmal atrial fibrillation: Secondary | ICD-10-CM

## 2017-01-20 LAB — POCT INR: INR: 3.5

## 2017-01-22 ENCOUNTER — Ambulatory Visit (INDEPENDENT_AMBULATORY_CARE_PROVIDER_SITE_OTHER): Payer: Medicare Other | Admitting: *Deleted

## 2017-01-22 DIAGNOSIS — I495 Sick sinus syndrome: Secondary | ICD-10-CM

## 2017-01-22 NOTE — Progress Notes (Signed)
Remote pacemaker transmission.   

## 2017-01-23 ENCOUNTER — Encounter: Payer: Self-pay | Admitting: Cardiology

## 2017-01-23 DIAGNOSIS — L570 Actinic keratosis: Secondary | ICD-10-CM | POA: Diagnosis not present

## 2017-01-24 LAB — CUP PACEART REMOTE DEVICE CHECK
Battery Voltage: 2.92 V
Brady Statistic AP VP Percent: 98 %
Brady Statistic AS VP Percent: 1.7 %
Brady Statistic AS VS Percent: 1 %
Date Time Interrogation Session: 20180411065715
Implantable Lead Implant Date: 19980729
Implantable Lead Implant Date: 19980729
Implantable Lead Location: 753859
Implantable Pulse Generator Implant Date: 20130514
Lead Channel Impedance Value: 440 Ohm
Lead Channel Pacing Threshold Amplitude: 0.5 V
Lead Channel Pacing Threshold Amplitude: 1 V
Lead Channel Pacing Threshold Pulse Width: 0.4 ms
Lead Channel Pacing Threshold Pulse Width: 0.4 ms
Lead Channel Sensing Intrinsic Amplitude: 12 mV
Lead Channel Sensing Intrinsic Amplitude: 4.4 mV
Lead Channel Setting Pacing Amplitude: 1.25 V
Lead Channel Setting Pacing Amplitude: 1.5 V
Lead Channel Setting Pacing Pulse Width: 0.4 ms
MDC IDC LEAD LOCATION: 753860
MDC IDC MSMT BATTERY REMAINING LONGEVITY: 83 mo
MDC IDC MSMT BATTERY REMAINING PERCENTAGE: 73 %
MDC IDC MSMT LEADCHNL RA IMPEDANCE VALUE: 360 Ohm
MDC IDC SET LEADCHNL RV SENSING SENSITIVITY: 4 mV
MDC IDC STAT BRADY AP VS PERCENT: 1 %
MDC IDC STAT BRADY RA PERCENT PACED: 98 %
MDC IDC STAT BRADY RV PERCENT PACED: 99 %
Pulse Gen Model: 2210
Pulse Gen Serial Number: 7338831

## 2017-01-27 DIAGNOSIS — I872 Venous insufficiency (chronic) (peripheral): Secondary | ICD-10-CM | POA: Diagnosis not present

## 2017-01-27 DIAGNOSIS — L97822 Non-pressure chronic ulcer of other part of left lower leg with fat layer exposed: Secondary | ICD-10-CM | POA: Diagnosis not present

## 2017-01-27 DIAGNOSIS — Z7901 Long term (current) use of anticoagulants: Secondary | ICD-10-CM | POA: Diagnosis not present

## 2017-01-27 DIAGNOSIS — I890 Lymphedema, not elsewhere classified: Secondary | ICD-10-CM | POA: Diagnosis not present

## 2017-02-03 ENCOUNTER — Ambulatory Visit (INDEPENDENT_AMBULATORY_CARE_PROVIDER_SITE_OTHER): Payer: Medicare Other | Admitting: Pharmacist Clinician (PhC)/ Clinical Pharmacy Specialist

## 2017-02-03 DIAGNOSIS — I872 Venous insufficiency (chronic) (peripheral): Secondary | ICD-10-CM | POA: Diagnosis not present

## 2017-02-03 DIAGNOSIS — I480 Paroxysmal atrial fibrillation: Secondary | ICD-10-CM | POA: Diagnosis not present

## 2017-02-03 DIAGNOSIS — I890 Lymphedema, not elsewhere classified: Secondary | ICD-10-CM | POA: Diagnosis not present

## 2017-02-03 DIAGNOSIS — Z7901 Long term (current) use of anticoagulants: Secondary | ICD-10-CM

## 2017-02-03 DIAGNOSIS — L97822 Non-pressure chronic ulcer of other part of left lower leg with fat layer exposed: Secondary | ICD-10-CM | POA: Diagnosis not present

## 2017-02-03 LAB — POCT INR: INR: 2.6

## 2017-02-10 DIAGNOSIS — I890 Lymphedema, not elsewhere classified: Secondary | ICD-10-CM | POA: Diagnosis not present

## 2017-02-10 DIAGNOSIS — Z7901 Long term (current) use of anticoagulants: Secondary | ICD-10-CM | POA: Diagnosis not present

## 2017-02-10 DIAGNOSIS — I872 Venous insufficiency (chronic) (peripheral): Secondary | ICD-10-CM | POA: Diagnosis not present

## 2017-02-10 DIAGNOSIS — L97822 Non-pressure chronic ulcer of other part of left lower leg with fat layer exposed: Secondary | ICD-10-CM | POA: Diagnosis not present

## 2017-02-16 ENCOUNTER — Other Ambulatory Visit: Payer: Self-pay | Admitting: Cardiovascular Disease

## 2017-02-17 DIAGNOSIS — L97822 Non-pressure chronic ulcer of other part of left lower leg with fat layer exposed: Secondary | ICD-10-CM | POA: Diagnosis not present

## 2017-02-17 DIAGNOSIS — I872 Venous insufficiency (chronic) (peripheral): Secondary | ICD-10-CM | POA: Diagnosis not present

## 2017-02-17 DIAGNOSIS — I890 Lymphedema, not elsewhere classified: Secondary | ICD-10-CM | POA: Diagnosis not present

## 2017-02-17 DIAGNOSIS — Z7901 Long term (current) use of anticoagulants: Secondary | ICD-10-CM | POA: Diagnosis not present

## 2017-03-03 ENCOUNTER — Ambulatory Visit (INDEPENDENT_AMBULATORY_CARE_PROVIDER_SITE_OTHER): Payer: Medicare Other | Admitting: Pharmacist

## 2017-03-03 DIAGNOSIS — Z7901 Long term (current) use of anticoagulants: Secondary | ICD-10-CM

## 2017-03-03 DIAGNOSIS — I480 Paroxysmal atrial fibrillation: Secondary | ICD-10-CM | POA: Diagnosis not present

## 2017-03-03 LAB — POCT INR: INR: 3.9

## 2017-03-17 ENCOUNTER — Ambulatory Visit (INDEPENDENT_AMBULATORY_CARE_PROVIDER_SITE_OTHER): Payer: Medicare Other | Admitting: Pharmacist Clinician (PhC)/ Clinical Pharmacy Specialist

## 2017-03-17 DIAGNOSIS — Z7901 Long term (current) use of anticoagulants: Secondary | ICD-10-CM | POA: Diagnosis not present

## 2017-03-17 DIAGNOSIS — I480 Paroxysmal atrial fibrillation: Secondary | ICD-10-CM | POA: Diagnosis not present

## 2017-03-17 LAB — POCT INR: INR: 3

## 2017-03-19 DIAGNOSIS — C44622 Squamous cell carcinoma of skin of right upper limb, including shoulder: Secondary | ICD-10-CM | POA: Diagnosis not present

## 2017-03-31 DIAGNOSIS — I890 Lymphedema, not elsewhere classified: Secondary | ICD-10-CM | POA: Diagnosis not present

## 2017-03-31 DIAGNOSIS — L97812 Non-pressure chronic ulcer of other part of right lower leg with fat layer exposed: Secondary | ICD-10-CM | POA: Diagnosis not present

## 2017-03-31 DIAGNOSIS — L97222 Non-pressure chronic ulcer of left calf with fat layer exposed: Secondary | ICD-10-CM | POA: Diagnosis not present

## 2017-03-31 DIAGNOSIS — I872 Venous insufficiency (chronic) (peripheral): Secondary | ICD-10-CM | POA: Diagnosis not present

## 2017-03-31 DIAGNOSIS — Z7901 Long term (current) use of anticoagulants: Secondary | ICD-10-CM | POA: Diagnosis not present

## 2017-04-07 DIAGNOSIS — I890 Lymphedema, not elsewhere classified: Secondary | ICD-10-CM | POA: Diagnosis not present

## 2017-04-07 DIAGNOSIS — L97822 Non-pressure chronic ulcer of other part of left lower leg with fat layer exposed: Secondary | ICD-10-CM | POA: Diagnosis not present

## 2017-04-07 DIAGNOSIS — I872 Venous insufficiency (chronic) (peripheral): Secondary | ICD-10-CM | POA: Diagnosis not present

## 2017-04-07 DIAGNOSIS — L97812 Non-pressure chronic ulcer of other part of right lower leg with fat layer exposed: Secondary | ICD-10-CM | POA: Diagnosis not present

## 2017-04-07 DIAGNOSIS — L97222 Non-pressure chronic ulcer of left calf with fat layer exposed: Secondary | ICD-10-CM | POA: Diagnosis not present

## 2017-04-10 ENCOUNTER — Other Ambulatory Visit: Payer: Self-pay | Admitting: Family Medicine

## 2017-04-14 ENCOUNTER — Ambulatory Visit (INDEPENDENT_AMBULATORY_CARE_PROVIDER_SITE_OTHER): Payer: Medicare Other | Admitting: Pharmacist

## 2017-04-14 DIAGNOSIS — I480 Paroxysmal atrial fibrillation: Secondary | ICD-10-CM

## 2017-04-14 DIAGNOSIS — Z7901 Long term (current) use of anticoagulants: Secondary | ICD-10-CM | POA: Diagnosis not present

## 2017-04-14 LAB — POCT INR: INR: 3.6

## 2017-04-15 DIAGNOSIS — I890 Lymphedema, not elsewhere classified: Secondary | ICD-10-CM | POA: Diagnosis not present

## 2017-04-15 DIAGNOSIS — L97222 Non-pressure chronic ulcer of left calf with fat layer exposed: Secondary | ICD-10-CM | POA: Insufficient documentation

## 2017-04-15 DIAGNOSIS — I872 Venous insufficiency (chronic) (peripheral): Secondary | ICD-10-CM | POA: Diagnosis not present

## 2017-04-15 DIAGNOSIS — L97822 Non-pressure chronic ulcer of other part of left lower leg with fat layer exposed: Secondary | ICD-10-CM | POA: Diagnosis not present

## 2017-04-15 DIAGNOSIS — Z7901 Long term (current) use of anticoagulants: Secondary | ICD-10-CM | POA: Diagnosis not present

## 2017-04-15 DIAGNOSIS — L97812 Non-pressure chronic ulcer of other part of right lower leg with fat layer exposed: Secondary | ICD-10-CM | POA: Diagnosis not present

## 2017-04-18 ENCOUNTER — Other Ambulatory Visit: Payer: Self-pay | Admitting: Cardiovascular Disease

## 2017-04-18 NOTE — Telephone Encounter (Signed)
Waiting for patient to run out of 2mg  tablets to change to 1mg  tablets

## 2017-04-21 DIAGNOSIS — I890 Lymphedema, not elsewhere classified: Secondary | ICD-10-CM | POA: Diagnosis not present

## 2017-04-21 DIAGNOSIS — Z7901 Long term (current) use of anticoagulants: Secondary | ICD-10-CM | POA: Diagnosis not present

## 2017-04-21 DIAGNOSIS — L97822 Non-pressure chronic ulcer of other part of left lower leg with fat layer exposed: Secondary | ICD-10-CM | POA: Diagnosis not present

## 2017-04-21 DIAGNOSIS — L97222 Non-pressure chronic ulcer of left calf with fat layer exposed: Secondary | ICD-10-CM | POA: Diagnosis not present

## 2017-04-21 DIAGNOSIS — I872 Venous insufficiency (chronic) (peripheral): Secondary | ICD-10-CM | POA: Diagnosis not present

## 2017-04-21 DIAGNOSIS — L97812 Non-pressure chronic ulcer of other part of right lower leg with fat layer exposed: Secondary | ICD-10-CM | POA: Diagnosis not present

## 2017-04-23 ENCOUNTER — Ambulatory Visit (INDEPENDENT_AMBULATORY_CARE_PROVIDER_SITE_OTHER): Payer: Medicare Other | Admitting: *Deleted

## 2017-04-23 DIAGNOSIS — I495 Sick sinus syndrome: Secondary | ICD-10-CM

## 2017-04-23 LAB — CUP PACEART REMOTE DEVICE CHECK
Battery Remaining Longevity: 76 mo
Brady Statistic AP VS Percent: 1 %
Brady Statistic AS VP Percent: 1.8 %
Brady Statistic RV Percent Paced: 99 %
Date Time Interrogation Session: 20180711112825
Implantable Lead Implant Date: 19980729
Implantable Lead Location: 753859
Implantable Lead Location: 753860
Lead Channel Impedance Value: 390 Ohm
Lead Channel Pacing Threshold Amplitude: 0.875 V
Lead Channel Sensing Intrinsic Amplitude: 12 mV
Lead Channel Setting Pacing Amplitude: 1.5 V
Lead Channel Setting Pacing Pulse Width: 0.4 ms
Lead Channel Setting Sensing Sensitivity: 4 mV
MDC IDC LEAD IMPLANT DT: 19980729
MDC IDC MSMT BATTERY REMAINING PERCENTAGE: 65 %
MDC IDC MSMT BATTERY VOLTAGE: 2.9 V
MDC IDC MSMT LEADCHNL RA PACING THRESHOLD AMPLITUDE: 0.5 V
MDC IDC MSMT LEADCHNL RA PACING THRESHOLD PULSEWIDTH: 0.4 ms
MDC IDC MSMT LEADCHNL RA SENSING INTR AMPL: 4 mV
MDC IDC MSMT LEADCHNL RV IMPEDANCE VALUE: 490 Ohm
MDC IDC MSMT LEADCHNL RV PACING THRESHOLD PULSEWIDTH: 0.4 ms
MDC IDC PG IMPLANT DT: 20130514
MDC IDC SET LEADCHNL RV PACING AMPLITUDE: 1.125
MDC IDC STAT BRADY AP VP PERCENT: 98 %
MDC IDC STAT BRADY AS VS PERCENT: 1 %
MDC IDC STAT BRADY RA PERCENT PACED: 98 %
Pulse Gen Model: 2210
Pulse Gen Serial Number: 7338831

## 2017-04-23 NOTE — Progress Notes (Signed)
Remote pacemaker transmission.   

## 2017-04-24 ENCOUNTER — Ambulatory Visit (INDEPENDENT_AMBULATORY_CARE_PROVIDER_SITE_OTHER): Payer: Medicare Other | Admitting: Family Medicine

## 2017-04-24 ENCOUNTER — Ambulatory Visit (HOSPITAL_BASED_OUTPATIENT_CLINIC_OR_DEPARTMENT_OTHER)
Admission: RE | Admit: 2017-04-24 | Discharge: 2017-04-24 | Disposition: A | Discharge status: Home / Self Care | Payer: Medicare Other | Source: Ambulatory Visit | Attending: Family Medicine | Admitting: Family Medicine

## 2017-04-24 ENCOUNTER — Telehealth: Payer: Self-pay | Admitting: Family Medicine

## 2017-04-24 ENCOUNTER — Encounter: Payer: Self-pay | Admitting: Family Medicine

## 2017-04-24 VITALS — BP 118/80 | HR 72 | Temp 97.9°F | Resp 18 | Wt 188.0 lb

## 2017-04-24 DIAGNOSIS — Z5181 Encounter for therapeutic drug level monitoring: Secondary | ICD-10-CM | POA: Diagnosis not present

## 2017-04-24 DIAGNOSIS — K589 Irritable bowel syndrome without diarrhea: Secondary | ICD-10-CM | POA: Diagnosis not present

## 2017-04-24 DIAGNOSIS — M549 Dorsalgia, unspecified: Secondary | ICD-10-CM

## 2017-04-24 DIAGNOSIS — I700 Atherosclerosis of aorta: Secondary | ICD-10-CM | POA: Diagnosis not present

## 2017-04-24 DIAGNOSIS — R109 Unspecified abdominal pain: Secondary | ICD-10-CM

## 2017-04-24 DIAGNOSIS — I10 Essential (primary) hypertension: Secondary | ICD-10-CM | POA: Diagnosis not present

## 2017-04-24 DIAGNOSIS — D509 Iron deficiency anemia, unspecified: Secondary | ICD-10-CM | POA: Diagnosis not present

## 2017-04-24 DIAGNOSIS — I708 Atherosclerosis of other arteries: Secondary | ICD-10-CM | POA: Insufficient documentation

## 2017-04-24 DIAGNOSIS — R197 Diarrhea, unspecified: Secondary | ICD-10-CM

## 2017-04-24 DIAGNOSIS — Z7901 Long term (current) use of anticoagulants: Secondary | ICD-10-CM | POA: Diagnosis not present

## 2017-04-24 DIAGNOSIS — I480 Paroxysmal atrial fibrillation: Secondary | ICD-10-CM

## 2017-04-24 DIAGNOSIS — M545 Low back pain: Secondary | ICD-10-CM | POA: Diagnosis present

## 2017-04-24 DIAGNOSIS — R195 Other fecal abnormalities: Secondary | ICD-10-CM | POA: Diagnosis not present

## 2017-04-24 LAB — COMPREHENSIVE METABOLIC PANEL
ALBUMIN: 3.9 g/dL (ref 3.5–5.2)
ALT: 7 U/L (ref 0–35)
AST: 14 U/L (ref 0–37)
Alkaline Phosphatase: 56 U/L (ref 39–117)
BUN: 37 mg/dL — ABNORMAL HIGH (ref 6–23)
CHLORIDE: 110 meq/L (ref 96–112)
CO2: 23 mEq/L (ref 19–32)
Calcium: 10 mg/dL (ref 8.4–10.5)
Creatinine, Ser: 2.58 mg/dL — ABNORMAL HIGH (ref 0.40–1.20)
GFR: 18.59 mL/min — AB (ref >60.00)
Glucose, Bld: 89 mg/dL (ref 70–99)
POTASSIUM: 5.4 meq/L — AB (ref 3.5–5.1)
SODIUM: 141 meq/L (ref 135–145)
Total Bilirubin: 0.4 mg/dL (ref 0.2–1.2)
Total Protein: 6.8 g/dL (ref 6.0–8.3)

## 2017-04-24 LAB — CBC WITH DIFFERENTIAL/PLATELET
BASOS PCT: 1.1 % (ref 0.0–3.0)
Basophils Absolute: 0.1 10*3/uL (ref 0.0–0.1)
EOS PCT: 1.3 % (ref 0.0–5.0)
Eosinophils Absolute: 0.1 10*3/uL (ref 0.0–0.7)
HEMATOCRIT: 36.8 % (ref 36.0–46.0)
HEMOGLOBIN: 12.1 g/dL (ref 12.0–15.0)
Lymphocytes Relative: 17.8 % (ref 12.0–46.0)
Lymphs Abs: 2 10*3/uL (ref 0.7–4.0)
MCHC: 33 g/dL (ref 30.0–36.0)
MCV: 92.1 fl (ref 78.0–100.0)
MONO ABS: 1.1 10*3/uL — AB (ref 0.1–1.0)
Monocytes Relative: 10.3 % (ref 3.0–12.0)
Neutro Abs: 7.7 10*3/uL (ref 1.4–7.7)
Neutrophils Relative %: 69.5 % (ref 43.0–77.0)
Platelets: 178 10*3/uL (ref 150.0–400.0)
RBC: 4 Mil/uL (ref 3.87–5.11)
RDW: 13.3 % (ref 11.5–15.5)
WBC: 11 10*3/uL — AB (ref 4.0–10.5)

## 2017-04-24 LAB — URINALYSIS, ROUTINE W REFLEX MICROSCOPIC
Bilirubin Urine: NEGATIVE
HGB URINE DIPSTICK: NEGATIVE
Ketones, ur: NEGATIVE
Leukocytes, UA: NEGATIVE
Nitrite: POSITIVE — AB
PH: 5.5 (ref 5.0–8.0)
Specific Gravity, Urine: 1.02 (ref 1.000–1.030)
TOTAL PROTEIN, URINE-UPE24: NEGATIVE
UROBILINOGEN UA: 0.2 (ref 0.0–1.0)
Urine Glucose: NEGATIVE

## 2017-04-24 LAB — FERRITIN: FERRITIN: 56 ng/mL (ref 10.0–291.0)

## 2017-04-24 LAB — PROTIME-INR
INR: 1.4 ratio — AB (ref 0.8–1.0)
Prothrombin Time: 14.7 s — ABNORMAL HIGH (ref 9.6–13.1)

## 2017-04-24 NOTE — Telephone Encounter (Signed)
Relation to WK:MQKM Call back number:[phone]   Reason for call:  Patient was seen today and wanted to know when should she return, please advise

## 2017-04-24 NOTE — Progress Notes (Signed)
Subjective:  .pac  Patient ID: Jill Washington, female    DOB: Nov 05, 1925, 83 y.o.   MRN: [id number]  No chief complaint on file.   HPI  Patient is in today for Evaluation of GI symptoms. She notes for about 5 days now she's had frequent loose stool. She reports 5 days ago she had 2 episodes of very dark almost black stool but does not describe them as sticky. Does resolve but she is having anywhere from 3-5 loose stool daily until today. She's had some anorexia but no vomiting. She describes her stomach is very distended although it is somewhat better today. No obvious fevers or chills although she questions if she's had low-grade fevers. She notes also some increase in low back pain and malaise. No chest pain, palpitations or shortness of breath. No change in diet or medications recently.  Patient Care Team: Mosie Lukes, MD as PCP - General (Family Medicine) Croitoru, Dani Gobble, MD as Consulting Physician (Cardiology) Lynnell Dike, OD as Consulting Physician (Optometry)   Past Medical History:  Diagnosis Date  . Arthritis   . Blood transfusion   . CHB (complete heart block) ([place]) 11/10/2014  . Chicken pox as a child  . Colon polyps    hyperplastic  . Critical illness polyneuropathy ([place])   . Diarrhea 04/25/2017  . Diverticulosis of sigmoid colon 02/05/2007   mild  . Fecal incontinence   . Fibromyalgia   . GERD (gastroesophageal reflux disease)   . H/O measles   . H/O mumps   . Hiatal hernia   . History of atrial fibrillation   . History of chicken pox   . History of empyema of pleura   . History of gallstones   . History of skin cancer    face  . Hypercholesteremia   . Hypertension   . Hypothyroidism   . IBS (irritable bowel syndrome)   . Measles as a child  . Medicare annual wellness visit, subsequent 05/15/2015   Allergies verified: UTD   Immunization Status: Flu vaccine-- NA  Tdap-- 9-10 years ago per patient  PNA-- 09/30/07 (23)  Shingles-- 04/07/14   A/P:  Changes  to [place], [place] or Personal Hx: UTD Pap-- none recent  MMG--declines further MMgs (last 08/12/03 BI-RADS 1: Neg)  Bone Density-- 08/11/03 with Prescott Gum, MD- Normal, declines further testing CCS-- 05/07/12 with Silvano Rusk, MD- polyp removed- no   . Mitral valve prolapse   . Mumps as a child  . Overactive bladder 05/07/2011  . Pacemaker   . Peripheral neuropathy   . Shingles 8 yrs ago  . Sick sinus syndrome ([place])   . Urinary incontinence   . Venous insufficiency   . Vitamin D deficiency     Past Surgical History:  Procedure Laterality Date  . APPENDECTOMY  1971  . CARDIAC CATHETERIZATION  05/10/1997   Normal coronaries, MVP  . CERVICAL SPINE SURGERY    . CHOLECYSTECTOMY  03/2005   by Dr. Dalbert Batman  . COLONOSCOPY  02/05/2007,02/05/05   Dr. Silvano Rusk  . ESOPHAGOGASTRODUODENOSCOPY  02/05/2005,01/22/90   Dr. Silvano Rusk, Dr. Rachelle Hora  . NM MYOCAR PERF WALL MOTION  08/24/2010   Normal  . PACEMAKER GENERATOR CHANGE N/A 02/25/2012   Procedure: PACEMAKER GENERATOR CHANGE;  Surgeon: Sanda Klein, MD;  Location: [facility] CATH [facility];  Service: Cardiovascular;  Laterality: N/A;  . pacemaker implanted  05/11/97,08/02/05   [facility]  . TONSILLECTOMY    . TOTAL ABDOMINAL HYSTERECTOMY W/ BILATERAL SALPINGOOPHORECTOMY  Family History  Problem Relation Age of Onset  . Pancreatic cancer Mother   . Diabetes Mother 45       type 2  . Heart attack Brother   . Diabetes Brother        type 2  . Gallbladder disease Father   . Diabetes Father        type 2  . Diabetes Brother        X 2   . Cancer Sister 80       skin ca on face  . Diabetes Sister        type 2  . Diabetes Daughter        type 2  . Hypertension Daughter   . Alcohol abuse Daughter   . Diabetes Son        type 2  . Diabetes Sister        type 1  . Hypertension Sister   . Stomach cancer Maternal Aunt   . Colon cancer Unknown     Social History   Social History  . Marital status: Widowed    Spouse name: N/A  . Number  of children: 3  . Years of education: N/A   Occupational History  . retired Retired   Social History Main Topics  . Smoking status: Former Smoker    Packs/day: 2.00    Years: 6.00    Types: Cigarettes    Quit date: 10/15/1951  . Smokeless tobacco: Never Used  . Alcohol use No  . Drug use: No  . Sexual activity: Not on file   Other Topics Concern  . Not on file   Social History Narrative   She is a widow, one son to daughters. She is retired.    Outpatient Medications Prior to Visit  Medication Sig Dispense Refill  . atenolol (TENORMIN) 50 MG tablet TAKE ONE AND ONE-HALF TABLETS DAILY 135 tablet 3  . cholecalciferol (VITAMIN D) 1000 UNITS tablet Take 1,000 Units by mouth daily.    . furosemide (LASIX) 20 MG tablet TAKE 1 TABLET DAILY 90 tablet 0  . gabapentin (NEURONTIN) 300 MG capsule TAKE 1 CAPSULE TWICE A DAY 180 capsule 1  . levothyroxine (SYNTHROID, LEVOTHROID) 137 MCG tablet TAKE 1 TABLET DAILY 90 tablet 2  . MYRBETRIQ 50 MG TB24 tablet Take 1 tablet by mouth daily.    Marland Kitchen PACERONE 200 MG tablet TAKE ONE-HALF (1/2) TABLET DAILY 45 tablet 3  . polyvinyl alcohol (ARTIFICIAL TEARS) 1.4 % ophthalmic solution Place 1 drop into both eyes 3 (three) times daily as needed for dry eyes.    . pravastatin (PRAVACHOL) 40 MG tablet Take 1 tablet (40 mg total) by mouth daily. 90 tablet 0  . traMADol (ULTRAM) 50 MG tablet Take 1 tablet (50 mg total) by mouth daily as needed for severe pain. 90 tablet 0  . trimethoprim (TRIMPEX) 100 MG tablet Take 100 mg by mouth daily.    . valsartan-hydrochlorothiazide (DIOVAN-HCT) 80-12.5 MG tablet TAKE 1 TABLET DAILY 90 tablet 3  . warfarin (COUMADIN) 2 MG tablet TAKE 1 TABLET DAILY OR AS DIRECTED 90 tablet 1  . amoxicillin-clavulanate (AUGMENTIN) 875-125 MG tablet Take 1 tablet by mouth 2 (two) times daily. One po bid x 7 days 14 tablet 0  . Multiple Vitamin (MULTIVITAMIN WITH MINERALS) TABS tablet Take 1 tablet by mouth daily.    Marland Kitchen omeprazole  (PRILOSEC) 40 MG capsule TAKE 1 CAPSULE DAILY 90 capsule 1   No facility-administered medications prior to visit.  Allergies  Allergen Reactions  . Iodinated Diagnostic Agents Other (See Comments)    Pt was not told what the reaction was  . Cymbalta [Duloxetine Hcl] Other (See Comments)    Could not think straight  . Other Itching    Dial Soap    Review of Systems  Constitutional: Positive for malaise/fatigue. Negative for fever.  HENT: Negative for congestion.   Eyes: Negative for blurred vision.  Respiratory: Negative for shortness of breath.   Cardiovascular: Negative for chest pain, palpitations and leg swelling.  Gastrointestinal: Positive for abdominal pain, diarrhea, melena and nausea. Negative for blood in stool.  Genitourinary: Positive for frequency. Negative for dysuria.  Musculoskeletal: Positive for back pain. Negative for falls.  Skin: Negative for rash.  Neurological: Positive for weakness. Negative for dizziness, loss of consciousness and headaches.  Endo/Heme/Allergies: Negative for environmental allergies.  Psychiatric/Behavioral: Negative for depression. The patient is not nervous/anxious.        Objective:    Physical Exam  Constitutional: She is oriented to person, place, and time. She appears well-developed and well-nourished. No distress.  HENT:  Head: Normocephalic and atraumatic.  Nose: Nose normal.  Eyes: Right eye exhibits no discharge. Left eye exhibits no discharge.  Neck: Normal range of motion. Neck supple.  Cardiovascular: Normal rate.   No murmur heard. Irregularly irregular  Pulmonary/Chest: Effort normal and breath sounds normal.  Abdominal: Soft. Bowel sounds are normal. She exhibits distension. She exhibits no mass. There is tenderness. There is no rebound and no guarding.  Rectal exam no lesions or skin breakdown. Hemoccult negative  Musculoskeletal: She exhibits no edema.  Neurological: She is alert and oriented to person,  place, and time.  Skin: Skin is warm and dry.  Psychiatric: She has a normal mood and affect.  Nursing note and vitals reviewed.   BP 118/80 (BP Location: Left Arm, Patient Position: Sitting, Cuff Size: Normal)   Pulse 72   Temp 97.9 F (36.6 C) (Oral)   Resp 18   Wt 188 lb (85.3 kg)   SpO2 97%   BMI 30.34 kg/m  Wt Readings from Last 3 Encounters:  04/24/17 188 lb (85.3 kg)  01/02/17 197 lb 9.6 oz (89.6 kg)  10/23/16 195 lb 12.8 oz (88.8 kg)   BP Readings from Last 3 Encounters:  04/24/17 118/80  01/02/17 (!) 154/59  01/02/17 (!) 143/61     Immunization History  Administered Date(s) Administered  . Influenza Split 07/17/2011, 07/31/2012, 07/28/2013  . Influenza Whole 10/17/2009, 08/27/2010  . Influenza,inj,Quad PF,36+ Mos 08/08/2015  . Influenza-Unspecified 07/04/2016  . Pneumococcal Conjugate-13 10/30/2015  . Pneumococcal Polysaccharide-23 09/30/2007  . Tdap 10/30/2015  . Zoster 04/07/2014    Health Maintenance  Topic Date Due  . INFLUENZA VACCINE  05/14/2017  . TETANUS/TDAP  10/29/2025  . DEXA SCAN  Completed  . PNA vac Low Risk Adult  Completed    Lab Results  Component Value Date   WBC 11.0 (H) 04/24/2017   HGB 12.1 04/24/2017   HCT 36.8 04/24/2017   PLT 178.0 04/24/2017   GLUCOSE 89 04/24/2017   CHOL 158 10/25/2016   TRIG 192.0 (H) 10/25/2016   HDL 38.90 (L) 10/25/2016   LDLDIRECT 77.0 04/28/2015   LDLCALC 80 10/25/2016   ALT 7 04/24/2017   AST 14 04/24/2017   NA 141 04/24/2017   K 5.4 (H) 04/24/2017   CL 110 04/24/2017   CREATININE 2.58 (H) 04/24/2017   BUN 37 (H) 04/24/2017   CO2 23 04/24/2017   TSH  1.59 10/25/2016   INR 1.4 (H) 04/24/2017   HGBA1C 5.3 08/02/2015    Lab Results  Component Value Date   TSH 1.59 10/25/2016   Lab Results  Component Value Date   WBC 11.0 (H) 04/24/2017   HGB 12.1 04/24/2017   HCT 36.8 04/24/2017   MCV 92.1 04/24/2017   PLT 178.0 04/24/2017   Lab Results  Component Value Date   NA 141 04/24/2017    K 5.4 (H) 04/24/2017   CO2 23 04/24/2017   GLUCOSE 89 04/24/2017   BUN 37 (H) 04/24/2017   CREATININE 2.58 (H) 04/24/2017   BILITOT 0.4 04/24/2017   ALKPHOS 56 04/24/2017   AST 14 04/24/2017   ALT 7 04/24/2017   PROT 6.8 04/24/2017   ALBUMIN 3.9 04/24/2017   CALCIUM 10.0 04/24/2017   ANIONGAP 14 08/09/2015   GFR 18.59 (L) 04/24/2017   Lab Results  Component Value Date   CHOL 158 10/25/2016   Lab Results  Component Value Date   HDL 38.90 (L) 10/25/2016   Lab Results  Component Value Date   LDLCALC 80 10/25/2016   Lab Results  Component Value Date   TRIG 192.0 (H) 10/25/2016   Lab Results  Component Value Date   CHOLHDL 4 10/25/2016   Lab Results  Component Value Date   HGBA1C 5.3 08/02/2015         Assessment & Plan:   Problem List Items Addressed This Visit    Essential hypertension    Well controlled, no changes to meds. Encouraged heart healthy diet such as the DASH diet and exercise as tolerated.       Atrial fibrillation ([place])    Rate controlled, follows with coumadin clinic      Irritable bowel syndrome   Relevant Orders   CBC with Differential/Platelet (Completed)   CBC with Differential/Platelet   Anemia    Increase leafy greens, consider increased lean red meat and using cast iron cookware. Continue to monitor, report any concerns. CBC shows hgb within normal limits      Relevant Orders   Ferritin (Completed)   Back pain   Relevant Orders   CBC with Differential/Platelet (Completed)   Comprehensive metabolic panel (Completed)   DG Abd 2 Views (Completed)   Abdominal pain - Primary    Diffuse and achy. Some distension noted. Urinalysis possibly infected will await urine culture. WBC within normal limits. AXR showed a moderate amount of retained stool despite her recent loose stool so she can try MOM 2 tbls in 4 oz warm prune juice and a dulcolax suppository to try and clean out stool      Relevant Orders   CBC with Differential/Platelet  (Completed)   Ferritin (Completed)   Urinalysis   Urine Culture   Anticoagulated on Coumadin    Follows with coumadin clinic, INR subtherapeutic but with question of dark stool will not alter therapy at this time      Relevant Orders   Comprehensive metabolic panel (Completed)   INR/PT (Completed)   Diarrhea    Had 2 episodes of blackish diarrhea about 5 days ago but that resolved. Was having 3-5 episodes daily but now the frequency is improving. Did hemoccult in office and it was negative. She is encouraged to proceed with 24 hours of clear fluids followed by 24 hours of BRAT diet and advance diet as tolerated. Seek care if symptoms worsen.  AXR showed a moderate amount of retained stool despite her recent loose stool so she can try  MOM 2 tbls in 4 oz warm prune juice and a dulcolax suppository to try and clean out stool         I have discontinued Ms. Hoback's multivitamin with minerals, omeprazole, and amoxicillin-clavulanate. I am also having her maintain her cholecalciferol, polyvinyl alcohol, trimethoprim, MYRBETRIQ, gabapentin, traMADol, furosemide, levothyroxine, warfarin, valsartan-hydrochlorothiazide, atenolol, PACERONE, and pravastatin.  No orders of the defined types were placed in this encounter.   CMA served as Education administrator during this visit. History, Physical and Plan performed by medical provider. Documentation and orders reviewed and attested to.  [name], MD

## 2017-04-24 NOTE — Assessment & Plan Note (Addendum)
Increase leafy greens, consider increased lean red meat and using cast iron cookware. Continue to monitor, report any concerns. CBC shows hgb within normal limits

## 2017-04-24 NOTE — Telephone Encounter (Signed)
Spoke with patient she knows she is to return in about 4-6 weeks, I have already scheduled appt. \  PC

## 2017-04-24 NOTE — Patient Instructions (Signed)
Gastrointestinal Bleeding Gastrointestinal bleeding is bleeding somewhere along the path food travels through the body (digestive tract). This path is anywhere between the mouth and the opening of the butt (anus). You may have blood in your poop (stools) or have black poop. If you throw up (vomit), there may be blood in it. This condition can be mild, serious, or even life-threatening. If you have a lot of bleeding, you may need to stay in the hospital. Follow these instructions at home:  Take over-the-counter and prescription medicines only as told by your doctor.  Eat foods that have a lot of fiber in them. These foods include whole grains, fruits, and vegetables. You can also try eating 1-3 prunes each day.  Drink enough fluid to keep your pee (urine) clear or pale yellow.  Keep all follow-up visits as told by your doctor. This is important. Contact a doctor if:  Your symptoms do not get better. Get help right away if:  Your bleeding gets worse.  You feel dizzy or you pass out (faint).  You feel weak.  You have very bad cramps in your back or belly (abdomen).  You pass large clumps of blood (clots) in your poop.  Your symptoms are getting worse. This information is not intended to replace advice given to you by your health care provider. Make sure you discuss any questions you have with your health care provider. Document Released: 07/09/2008 Document Revised: 03/07/2016 Document Reviewed: 03/20/2015 Elsevier Interactive Patient Education  2018 [employer]. Anemia, Nonspecific Anemia is a condition in which the concentration of red blood cells or hemoglobin in the blood is below normal. Hemoglobin is a substance in red blood cells that carries oxygen to the tissues of the body. Anemia results in not enough oxygen reaching these tissues. What are the causes? Common causes of anemia include:  Excessive bleeding. Bleeding may be internal or external. This includes excessive  bleeding from periods (in women) or from the intestine.  Poor nutrition.  Chronic kidney, thyroid, and liver disease.  Bone marrow disorders that decrease red blood cell production.  Cancer and treatments for cancer.  HIV, AIDS, and their treatments.  Spleen problems that increase red blood cell destruction.  Blood disorders.  Excess destruction of red blood cells due to infection, medicines, and autoimmune disorders.  What are the signs or symptoms?  Minor weakness.  Dizziness.  Headache.  Palpitations.  Shortness of breath, especially with exercise.  Paleness.  Cold sensitivity.  Indigestion.  Nausea.  Difficulty sleeping.  Difficulty concentrating. Symptoms may occur suddenly or they may develop slowly. How is this diagnosed? Additional blood tests are often needed. These help your health care provider determine the best treatment. Your health care provider will check your stool for blood and look for other causes of blood loss. How is this treated? Treatment varies depending on the cause of the anemia. Treatment can include:  Supplements of iron, vitamin Y10, or folic acid.  Hormone medicines.  A blood transfusion. This may be needed if blood loss is severe.  Hospitalization. This may be needed if there is significant continual blood loss.  Dietary changes.  Spleen removal.  Follow these instructions at home: Keep all follow-up appointments. It often takes many weeks to correct anemia, and having your health care provider check on your condition and your response to treatment is very important. Get help right away if:  You develop extreme weakness, shortness of breath, or chest pain.  You become dizzy or have trouble concentrating.  You develop heavy vaginal bleeding.  You develop a rash.  You have bloody or black, tarry stools.  You faint.  You vomit up blood.  You vomit repeatedly.  You have abdominal pain.  You have a fever or  persistent symptoms for more than 2-3 days.  You have a fever and your symptoms suddenly get worse.  You are dehydrated. This information is not intended to replace advice given to you by your health care provider. Make sure you discuss any questions you have with your health care provider. Document Released: 11/07/2004 Document Revised: 03/13/2016 Document Reviewed: 03/26/2013 Elsevier Interactive Patient Education  2017 [employer].

## 2017-04-25 ENCOUNTER — Encounter: Payer: Self-pay | Admitting: Family Medicine

## 2017-04-25 DIAGNOSIS — R197 Diarrhea, unspecified: Secondary | ICD-10-CM

## 2017-04-25 DIAGNOSIS — R109 Unspecified abdominal pain: Secondary | ICD-10-CM | POA: Insufficient documentation

## 2017-04-25 DIAGNOSIS — Z7901 Long term (current) use of anticoagulants: Secondary | ICD-10-CM | POA: Insufficient documentation

## 2017-04-25 HISTORY — DX: Diarrhea, unspecified: R19.7

## 2017-04-25 NOTE — Assessment & Plan Note (Signed)
Follows with coumadin clinic, INR subtherapeutic but with question of dark stool will not alter therapy at this time

## 2017-04-25 NOTE — Assessment & Plan Note (Addendum)
Rate controlled, follows with coumadin clinic

## 2017-04-25 NOTE — Assessment & Plan Note (Signed)
Well controlled, no changes to meds. Encouraged heart healthy diet such as the DASH diet and exercise as tolerated.  °

## 2017-04-25 NOTE — Assessment & Plan Note (Signed)
Diffuse and achy. Some distension noted. Urinalysis possibly infected will await urine culture. WBC within normal limits. AXR showed a moderate amount of retained stool despite her recent loose stool so she can try MOM 2 tbls in 4 oz warm prune juice and a dulcolax suppository to try and clean out stool

## 2017-04-25 NOTE — Assessment & Plan Note (Addendum)
Had 2 episodes of blackish diarrhea about 5 days ago but that resolved. Was having 3-5 episodes daily but now the frequency is improving. Did hemoccult in office and it was negative. She is encouraged to proceed with 24 hours of clear fluids followed by 24 hours of BRAT diet and advance diet as tolerated. Seek care if symptoms worsen.  AXR showed a moderate amount of retained stool despite her recent loose stool so she can try MOM 2 tbls in 4 oz warm prune juice and a dulcolax suppository to try and clean out stool

## 2017-04-27 ENCOUNTER — Other Ambulatory Visit: Payer: Self-pay | Admitting: Family Medicine

## 2017-04-27 LAB — URINE CULTURE

## 2017-04-27 MED ORDER — CEFDINIR 300 MG PO CAPS: 300.0000 mg | ORAL_CAPSULE | Freq: Two times a day (BID) | ORAL | 0 refills | Status: AC

## 2017-04-28 ENCOUNTER — Ambulatory Visit (INDEPENDENT_AMBULATORY_CARE_PROVIDER_SITE_OTHER): Payer: Medicare Other | Admitting: Pharmacist Clinician (PhC)/ Clinical Pharmacy Specialist

## 2017-04-28 DIAGNOSIS — I480 Paroxysmal atrial fibrillation: Secondary | ICD-10-CM

## 2017-04-28 DIAGNOSIS — I890 Lymphedema, not elsewhere classified: Secondary | ICD-10-CM | POA: Diagnosis not present

## 2017-04-28 DIAGNOSIS — Z7901 Long term (current) use of anticoagulants: Secondary | ICD-10-CM

## 2017-04-28 DIAGNOSIS — I872 Venous insufficiency (chronic) (peripheral): Secondary | ICD-10-CM | POA: Diagnosis not present

## 2017-04-28 DIAGNOSIS — L97222 Non-pressure chronic ulcer of left calf with fat layer exposed: Secondary | ICD-10-CM | POA: Diagnosis not present

## 2017-04-28 DIAGNOSIS — L97812 Non-pressure chronic ulcer of other part of right lower leg with fat layer exposed: Secondary | ICD-10-CM | POA: Diagnosis not present

## 2017-04-28 LAB — POCT INR: INR: 1.8

## 2017-04-29 ENCOUNTER — Encounter: Payer: Self-pay | Admitting: Cardiology

## 2017-04-30 ENCOUNTER — Telehealth: Payer: Self-pay | Admitting: Family Medicine

## 2017-04-30 DIAGNOSIS — K589 Irritable bowel syndrome without diarrhea: Secondary | ICD-10-CM

## 2017-04-30 NOTE — Telephone Encounter (Signed)
Orders placed and patient notified.

## 2017-04-30 NOTE — Telephone Encounter (Signed)
Relation to IZ:XYOF Call back [phone] Pharmacy:  Reason for call:  Patient requesting lab orders please place at [facility] location, please advise

## 2017-05-01 ENCOUNTER — Encounter: Payer: Self-pay | Admitting: Family Medicine

## 2017-05-01 ENCOUNTER — Ambulatory Visit (INDEPENDENT_AMBULATORY_CARE_PROVIDER_SITE_OTHER): Payer: Medicare Other | Admitting: Pharmacist Clinician (PhC)/ Clinical Pharmacy Specialist

## 2017-05-01 ENCOUNTER — Other Ambulatory Visit: Payer: Medicare Other

## 2017-05-01 DIAGNOSIS — Z7901 Long term (current) use of anticoagulants: Secondary | ICD-10-CM

## 2017-05-01 DIAGNOSIS — I480 Paroxysmal atrial fibrillation: Secondary | ICD-10-CM

## 2017-05-01 DIAGNOSIS — K589 Irritable bowel syndrome without diarrhea: Secondary | ICD-10-CM | POA: Diagnosis not present

## 2017-05-01 LAB — POCT INR: INR: 2

## 2017-05-05 DIAGNOSIS — I890 Lymphedema, not elsewhere classified: Secondary | ICD-10-CM | POA: Diagnosis not present

## 2017-05-05 DIAGNOSIS — L97222 Non-pressure chronic ulcer of left calf with fat layer exposed: Secondary | ICD-10-CM | POA: Diagnosis not present

## 2017-05-05 DIAGNOSIS — L97812 Non-pressure chronic ulcer of other part of right lower leg with fat layer exposed: Secondary | ICD-10-CM | POA: Diagnosis not present

## 2017-05-05 DIAGNOSIS — I872 Venous insufficiency (chronic) (peripheral): Secondary | ICD-10-CM | POA: Diagnosis not present

## 2017-05-12 DIAGNOSIS — L97222 Non-pressure chronic ulcer of left calf with fat layer exposed: Secondary | ICD-10-CM | POA: Diagnosis not present

## 2017-05-12 DIAGNOSIS — Z7901 Long term (current) use of anticoagulants: Secondary | ICD-10-CM | POA: Diagnosis not present

## 2017-05-12 DIAGNOSIS — I872 Venous insufficiency (chronic) (peripheral): Secondary | ICD-10-CM | POA: Diagnosis not present

## 2017-05-12 DIAGNOSIS — L97812 Non-pressure chronic ulcer of other part of right lower leg with fat layer exposed: Secondary | ICD-10-CM | POA: Diagnosis not present

## 2017-05-12 DIAGNOSIS — I890 Lymphedema, not elsewhere classified: Secondary | ICD-10-CM | POA: Diagnosis not present

## 2017-05-13 ENCOUNTER — Telehealth: Payer: Self-pay | Admitting: Family Medicine

## 2017-05-13 NOTE — Telephone Encounter (Signed)
Called pt to schedule awv. Lvm for pt to call office to schedule appt.  °

## 2017-05-19 DIAGNOSIS — I890 Lymphedema, not elsewhere classified: Secondary | ICD-10-CM | POA: Diagnosis not present

## 2017-05-19 DIAGNOSIS — I872 Venous insufficiency (chronic) (peripheral): Secondary | ICD-10-CM | POA: Diagnosis not present

## 2017-05-19 DIAGNOSIS — Z7901 Long term (current) use of anticoagulants: Secondary | ICD-10-CM | POA: Diagnosis not present

## 2017-05-19 DIAGNOSIS — L97812 Non-pressure chronic ulcer of other part of right lower leg with fat layer exposed: Secondary | ICD-10-CM | POA: Diagnosis not present

## 2017-05-19 DIAGNOSIS — L97222 Non-pressure chronic ulcer of left calf with fat layer exposed: Secondary | ICD-10-CM | POA: Diagnosis not present

## 2017-05-20 ENCOUNTER — Ambulatory Visit (INDEPENDENT_AMBULATORY_CARE_PROVIDER_SITE_OTHER): Payer: Medicare Other | Admitting: Family Medicine

## 2017-05-20 ENCOUNTER — Encounter: Payer: Self-pay | Admitting: Family Medicine

## 2017-05-20 VITALS — BP 120/70 | HR 72 | Temp 97.8°F | Resp 18 | Wt 189.4 lb

## 2017-05-20 DIAGNOSIS — N390 Urinary tract infection, site not specified: Secondary | ICD-10-CM

## 2017-05-20 DIAGNOSIS — I10 Essential (primary) hypertension: Secondary | ICD-10-CM

## 2017-05-20 DIAGNOSIS — I495 Sick sinus syndrome: Secondary | ICD-10-CM | POA: Diagnosis not present

## 2017-05-20 DIAGNOSIS — E7800 Pure hypercholesterolemia, unspecified: Secondary | ICD-10-CM | POA: Diagnosis not present

## 2017-05-20 DIAGNOSIS — E039 Hypothyroidism, unspecified: Secondary | ICD-10-CM

## 2017-05-20 DIAGNOSIS — R600 Localized edema: Secondary | ICD-10-CM | POA: Diagnosis not present

## 2017-05-20 DIAGNOSIS — N3281 Overactive bladder: Secondary | ICD-10-CM | POA: Diagnosis not present

## 2017-05-20 DIAGNOSIS — R109 Unspecified abdominal pain: Secondary | ICD-10-CM | POA: Diagnosis not present

## 2017-05-20 NOTE — Assessment & Plan Note (Signed)
Agrees to try ETI stockings and have them sized and use them daily on in am and off in pm

## 2017-05-20 NOTE — Patient Instructions (Signed)

## 2017-05-20 NOTE — Assessment & Plan Note (Signed)
Tolerating Coumadin. Rate controlled today

## 2017-05-20 NOTE — Assessment & Plan Note (Signed)
On Levothyroxine, continue to monitor 

## 2017-05-20 NOTE — Assessment & Plan Note (Signed)
Well controlled, no changes to meds. Encouraged heart healthy diet such as the DASH diet and exercise as tolerated.  °

## 2017-05-20 NOTE — Assessment & Plan Note (Signed)
Follows with Dr McDiarmid at [facility] urology and doing some what better on Myrebetriq

## 2017-05-20 NOTE — Progress Notes (Signed)
Subjective:  I acted as a Education administrator for Dr. Charlett Blake. [name], [place]  Patient ID: Jill Washington, female    DOB: January 19, 1926, 83 y.o.   MRN: [id number]  No chief complaint on file.   HPI  Patient is in today for a follow up, she is following up on her abdominal pain she was having last month. She states today she is doing well with no new concerns. No recent febrile illness or acute hospitalizations. Denies CP/palp/SOB/HA/congestion/fevers/GI or GU c/o. Taking meds as prescribed. Her abdominal pain is much better since treatment of her UTI but does admit over the last couple of days to increased urianry frequency again.    Patient Care Team: Mosie Lukes, MD as PCP - General (Family Medicine) Croitoru, Dani Gobble, MD as Consulting Physician (Cardiology) Lynnell Dike, OD as Consulting Physician (Optometry)   Past Medical History:  Diagnosis Date  . Arthritis   . Blood transfusion   . CHB (complete heart block) ([employer]) 11/10/2014  . Chicken pox as a child  . Colon polyps    hyperplastic  . Critical illness polyneuropathy ([place])   . Diarrhea 04/25/2017  . Diverticulosis of sigmoid colon 02/05/2007   mild  . Fecal incontinence   . Fibromyalgia   . GERD (gastroesophageal reflux disease)   . H/O measles   . H/O mumps   . Hiatal hernia   . History of atrial fibrillation   . History of chicken pox   . History of empyema of pleura   . History of gallstones   . History of skin cancer    face  . Hypercholesteremia   . Hypertension   . Hypothyroidism   . IBS (irritable bowel syndrome)   . Measles as a child  . Medicare annual wellness visit, subsequent 05/15/2015   Allergies verified: UTD   Immunization Status: Flu vaccine-- NA  Tdap-- 9-10 years ago per patient  PNA-- 09/30/07 (23)  Shingles-- 04/07/14   A/P:  Changes to [place], [place] or Personal Hx: UTD Pap-- none recent  MMG--declines further MMgs (last 08/12/03 BI-RADS 1: Neg)  Bone Density-- 08/11/03 with Prescott Gum, MD- Normal, declines  further testing CCS-- 05/07/12 with Silvano Rusk, MD- polyp removed- no   . Mitral valve prolapse   . Mumps as a child  . Overactive bladder 05/07/2011  . Pacemaker   . Peripheral neuropathy   . Shingles 8 yrs ago  . Sick sinus syndrome ([place])   . Urinary incontinence   . Venous insufficiency   . Vitamin D deficiency     Past Surgical History:  Procedure Laterality Date  . APPENDECTOMY  1971  . CARDIAC CATHETERIZATION  05/10/1997   Normal coronaries, MVP  . CERVICAL SPINE SURGERY    . CHOLECYSTECTOMY  03/2005   by Dr. Dalbert Batman  . COLONOSCOPY  02/05/2007,02/05/05   Dr. Silvano Rusk  . ESOPHAGOGASTRODUODENOSCOPY  02/05/2005,01/22/90   Dr. Silvano Rusk, Dr. Rachelle Hora  . NM MYOCAR PERF WALL MOTION  08/24/2010   Normal  . PACEMAKER GENERATOR CHANGE N/A 02/25/2012   Procedure: PACEMAKER GENERATOR CHANGE;  Surgeon: Sanda Klein, MD;  Location: [facility] CATH [facility];  Service: Cardiovascular;  Laterality: N/A;  . pacemaker implanted  05/11/97,08/02/05   [facility]  . TONSILLECTOMY    . TOTAL ABDOMINAL HYSTERECTOMY W/ BILATERAL SALPINGOOPHORECTOMY      Family History  Problem Relation Age of Onset  . Pancreatic cancer Mother   . Diabetes Mother 25       type 2  . Heart  attack Brother   . Diabetes Brother        type 2  . Gallbladder disease Father   . Diabetes Father        type 2  . Diabetes Brother        X 2   . Cancer Sister 80       skin ca on face  . Diabetes Sister        type 2  . Diabetes Daughter        type 2  . Hypertension Daughter   . Alcohol abuse Daughter   . Diabetes Son        type 2  . Diabetes Sister        type 1  . Hypertension Sister   . Stomach cancer Maternal Aunt   . Colon cancer Unknown     Social History   Social History  . Marital status: Widowed    Spouse name: N/A  . Number of children: 3  . Years of education: N/A   Occupational History  . retired Retired   Social History Main Topics  . Smoking status: Former Smoker    Packs/day: 2.00      Years: 6.00    Types: Cigarettes    Quit date: 10/15/1951  . Smokeless tobacco: Never Used  . Alcohol use No  . Drug use: No  . Sexual activity: Not on file   Other Topics Concern  . Not on file   Social History Narrative   She is a widow, one son to daughters. She is retired.    Outpatient Medications Prior to Visit  Medication Sig Dispense Refill  . atenolol (TENORMIN) 50 MG tablet TAKE ONE AND ONE-HALF TABLETS DAILY 135 tablet 3  . cholecalciferol (VITAMIN D) 1000 UNITS tablet Take 1,000 Units by mouth daily.    . furosemide (LASIX) 20 MG tablet TAKE 1 TABLET DAILY 90 tablet 0  . gabapentin (NEURONTIN) 300 MG capsule TAKE 1 CAPSULE TWICE A DAY 180 capsule 1  . levothyroxine (SYNTHROID, LEVOTHROID) 137 MCG tablet TAKE 1 TABLET DAILY 90 tablet 2  . MYRBETRIQ 50 MG TB24 tablet Take 1 tablet by mouth daily.    Marland Kitchen PACERONE 200 MG tablet TAKE ONE-HALF (1/2) TABLET DAILY 45 tablet 3  . polyvinyl alcohol (ARTIFICIAL TEARS) 1.4 % ophthalmic solution Place 1 drop into both eyes 3 (three) times daily as needed for dry eyes.    . pravastatin (PRAVACHOL) 40 MG tablet Take 1 tablet (40 mg total) by mouth daily. 90 tablet 0  . traMADol (ULTRAM) 50 MG tablet Take 1 tablet (50 mg total) by mouth daily as needed for severe pain. 90 tablet 0  . trimethoprim (TRIMPEX) 100 MG tablet Take 100 mg by mouth daily.    . valsartan-hydrochlorothiazide (DIOVAN-HCT) 80-12.5 MG tablet TAKE 1 TABLET DAILY 90 tablet 3  . warfarin (COUMADIN) 2 MG tablet TAKE 1 TABLET DAILY OR AS DIRECTED 90 tablet 1   No facility-administered medications prior to visit.     Allergies  Allergen Reactions  . Iodinated Diagnostic Agents Other (See Comments)    Pt was not told what the reaction was  . Cymbalta [Duloxetine Hcl] Other (See Comments)    Could not think straight  . Other Itching    Dial Soap    Review of Systems  Constitutional: Negative for fever and malaise/fatigue.  HENT: Negative for congestion.   Eyes:  Negative for blurred vision.  Respiratory: Negative for cough and shortness of breath.  Cardiovascular: Negative for chest pain, palpitations and leg swelling.  Gastrointestinal: Positive for abdominal pain. Negative for vomiting.  Genitourinary: Positive for frequency. Negative for dysuria and hematuria.  Musculoskeletal: Negative for back pain.  Skin: Negative for rash.  Neurological: Negative for loss of consciousness and headaches.       Objective:    Physical Exam  Constitutional: She is oriented to person, place, and time. She appears well-developed and well-nourished. No distress.  HENT:  Head: Normocephalic and atraumatic.  Eyes: Conjunctivae are normal.  Neck: Normal range of motion. No thyromegaly present.  Cardiovascular: Normal rate and regular rhythm.   Pulmonary/Chest: Effort normal and breath sounds normal. She has no wheezes.  Abdominal: Soft. Bowel sounds are normal. There is no tenderness.  Musculoskeletal: Normal range of motion. She exhibits no edema or deformity.  Neurological: She is alert and oriented to person, place, and time.  Skin: Skin is warm and dry. She is not diaphoretic.  Psychiatric: She has a normal mood and affect.    BP 120/70 (BP Location: Left Arm, Patient Position: Sitting, Cuff Size: Normal)   Pulse 72   Temp 97.8 F (36.6 C) (Oral)   Resp 18   Wt 189 lb 6.4 oz (85.9 kg)   SpO2 94%   BMI 30.57 kg/m  Wt Readings from Last 3 Encounters:  05/20/17 189 lb 6.4 oz (85.9 kg)  04/24/17 188 lb (85.3 kg)  01/02/17 197 lb 9.6 oz (89.6 kg)   BP Readings from Last 3 Encounters:  05/20/17 120/70  04/24/17 118/80  01/02/17 (!) 154/59     Immunization History  Administered Date(s) Administered  . Influenza Split 07/17/2011, 07/31/2012, 07/28/2013  . Influenza Whole 10/17/2009, 08/27/2010  . Influenza,inj,Quad PF,36+ Mos 08/08/2015  . Influenza-Unspecified 07/04/2016  . Pneumococcal Conjugate-13 10/30/2015  . Pneumococcal  Polysaccharide-23 09/30/2007  . Tdap 10/30/2015  . Zoster 04/07/2014    Health Maintenance  Topic Date Due  . INFLUENZA VACCINE  05/14/2017  . TETANUS/TDAP  10/29/2025  . DEXA SCAN  Completed  . PNA vac Low Risk Adult  Completed    Lab Results  Component Value Date   WBC 7.0 05/20/2017   HGB 12.1 05/20/2017   HCT 36.9 05/20/2017   PLT 219.0 05/20/2017   GLUCOSE 94 05/20/2017   CHOL 158 10/25/2016   TRIG 192.0 (H) 10/25/2016   HDL 38.90 (L) 10/25/2016   LDLDIRECT 77.0 04/28/2015   LDLCALC 80 10/25/2016   ALT 8 05/20/2017   AST 15 05/20/2017   NA 140 05/20/2017   K 4.8 05/20/2017   CL 109 05/20/2017   CREATININE 2.34 (H) 05/20/2017   BUN 23 05/20/2017   CO2 26 05/20/2017   TSH 1.59 10/25/2016   INR 2.0 05/01/2017   HGBA1C 5.3 08/02/2015    Lab Results  Component Value Date   TSH 1.59 10/25/2016   Lab Results  Component Value Date   WBC 7.0 05/20/2017   HGB 12.1 05/20/2017   HCT 36.9 05/20/2017   MCV 93.8 05/20/2017   PLT 219.0 05/20/2017   Lab Results  Component Value Date   NA 140 05/20/2017   K 4.8 05/20/2017   CO2 26 05/20/2017   GLUCOSE 94 05/20/2017   BUN 23 05/20/2017   CREATININE 2.34 (H) 05/20/2017   BILITOT 0.2 05/20/2017   ALKPHOS 58 05/20/2017   AST 15 05/20/2017   ALT 8 05/20/2017   PROT 6.7 05/20/2017   ALBUMIN 3.7 05/20/2017   CALCIUM 9.4 05/20/2017   ANIONGAP 14 08/09/2015  GFR 20.81 (L) 05/20/2017   Lab Results  Component Value Date   CHOL 158 10/25/2016   Lab Results  Component Value Date   HDL 38.90 (L) 10/25/2016   Lab Results  Component Value Date   LDLCALC 80 10/25/2016   Lab Results  Component Value Date   TRIG 192.0 (H) 10/25/2016   Lab Results  Component Value Date   CHOLHDL 4 10/25/2016   Lab Results  Component Value Date   HGBA1C 5.3 08/02/2015         Assessment & Plan:   Problem List Items Addressed This Visit    Hypothyroidism    On Levothyroxine, continue to monitor       HYPERCHOLESTEROLEMIA    Encouraged heart healthy diet, increase exercise, avoid trans fats, consider a krill oil cap daily      Essential hypertension    Well controlled, no changes to meds. Encouraged heart healthy diet such as the DASH diet and exercise as tolerated.       Relevant Orders   Comprehensive metabolic panel (Completed)   Overactive bladder    Follows with Dr McDiarmid at [facility] urology and doing some what better on Myrebetriq      Pedal edema    Agrees to try ETI stockings and have them sized and use them daily on in am and off in pm      SSS (sick sinus syndrome) ([place])    Tolerating Coumadin. Rate controlled today      Abdominal pain    Improved with treatment of the UTI       Other Visit Diagnoses    Urinary tract infection without hematuria, site unspecified    -  Primary   Relevant Orders   Urinalysis   Urine Culture   CBC w/Diff (Completed)      I am having Ms. Garringer maintain her cholecalciferol, polyvinyl alcohol, trimethoprim, MYRBETRIQ, gabapentin, traMADol, furosemide, levothyroxine, warfarin, valsartan-hydrochlorothiazide, atenolol, PACERONE, and pravastatin.  No orders of the defined types were placed in this encounter.   CMA served as Education administrator during this visit. History, Physical and Plan performed by medical provider. Documentation and orders reviewed and attested to.  [name], MD

## 2017-05-20 NOTE — Assessment & Plan Note (Signed)
Encouraged heart healthy diet, increase exercise, avoid trans fats, consider a krill oil cap daily 

## 2017-05-20 NOTE — Assessment & Plan Note (Signed)
Improved with treatment of the UTI

## 2017-05-21 LAB — CBC WITH DIFFERENTIAL/PLATELET
BASOS PCT: 1.2 % (ref 0.0–3.0)
Basophils Absolute: 0.1 10*3/uL (ref 0.0–0.1)
EOS PCT: 2.9 % (ref 0.0–5.0)
Eosinophils Absolute: 0.2 10*3/uL (ref 0.0–0.7)
HCT: 36.9 % (ref 36.0–46.0)
Hemoglobin: 12.1 g/dL (ref 12.0–15.0)
LYMPHS ABS: 1.3 10*3/uL (ref 0.7–4.0)
Lymphocytes Relative: 18.2 % (ref 12.0–46.0)
MCHC: 32.8 g/dL (ref 30.0–36.0)
MCV: 93.8 fl (ref 78.0–100.0)
MONO ABS: 0.7 10*3/uL (ref 0.1–1.0)
MONOS PCT: 9.8 % (ref 3.0–12.0)
NEUTROS ABS: 4.8 10*3/uL (ref 1.4–7.7)
NEUTROS PCT: 67.9 % (ref 43.0–77.0)
PLATELETS: 219 10*3/uL (ref 150.0–400.0)
RBC: 3.93 Mil/uL (ref 3.87–5.11)
RDW: 13.5 % (ref 11.5–15.5)
WBC: 7 10*3/uL (ref 4.0–10.5)

## 2017-05-21 LAB — URINALYSIS, ROUTINE W REFLEX MICROSCOPIC
Bilirubin Urine: NEGATIVE
KETONES UR: NEGATIVE
NITRITE: POSITIVE — AB
PH: 5.5 (ref 5.0–8.0)
SPECIFIC GRAVITY, URINE: 1.025 (ref 1.000–1.030)
TOTAL PROTEIN, URINE-UPE24: 30 — AB
URINE GLUCOSE: NEGATIVE
Urobilinogen, UA: 0.2 (ref 0.0–1.0)

## 2017-05-21 LAB — COMPREHENSIVE METABOLIC PANEL
ALT: 8 U/L (ref 0–35)
AST: 15 U/L (ref 0–37)
Albumin: 3.7 g/dL (ref 3.5–5.2)
Alkaline Phosphatase: 58 U/L (ref 39–117)
BUN: 23 mg/dL (ref 6–23)
CHLORIDE: 109 meq/L (ref 96–112)
CO2: 26 meq/L (ref 19–32)
Calcium: 9.4 mg/dL (ref 8.4–10.5)
Creatinine, Ser: 2.34 mg/dL — ABNORMAL HIGH (ref 0.40–1.20)
GFR: 20.81 mL/min — AB (ref >60.00)
GLUCOSE: 94 mg/dL (ref 70–99)
Potassium: 4.8 mEq/L (ref 3.5–5.1)
SODIUM: 140 meq/L (ref 135–145)
Total Bilirubin: 0.2 mg/dL (ref 0.2–1.2)
Total Protein: 6.7 g/dL (ref 6.0–8.3)

## 2017-05-22 LAB — URINE CULTURE

## 2017-05-23 ENCOUNTER — Other Ambulatory Visit: Payer: Self-pay | Admitting: Family Medicine

## 2017-05-23 MED ORDER — NITROFURANTOIN MONOHYD MACRO 100 MG PO CAPS: 100.0000 mg | ORAL_CAPSULE | Freq: Two times a day (BID) | ORAL | 0 refills | Status: DC

## 2017-05-27 ENCOUNTER — Ambulatory Visit (INDEPENDENT_AMBULATORY_CARE_PROVIDER_SITE_OTHER): Payer: Medicare Other | Admitting: Pharmacist Clinician (PhC)/ Clinical Pharmacy Specialist

## 2017-05-27 DIAGNOSIS — Z7901 Long term (current) use of anticoagulants: Secondary | ICD-10-CM | POA: Diagnosis not present

## 2017-05-27 DIAGNOSIS — I480 Paroxysmal atrial fibrillation: Secondary | ICD-10-CM | POA: Diagnosis not present

## 2017-05-27 LAB — POCT INR: INR: 2.1

## 2017-05-29 DIAGNOSIS — H26493 Other secondary cataract, bilateral: Secondary | ICD-10-CM | POA: Diagnosis not present

## 2017-05-29 DIAGNOSIS — H40003 Preglaucoma, unspecified, bilateral: Secondary | ICD-10-CM | POA: Diagnosis not present

## 2017-05-29 DIAGNOSIS — D3132 Benign neoplasm of left choroid: Secondary | ICD-10-CM | POA: Diagnosis not present

## 2017-06-20 ENCOUNTER — Other Ambulatory Visit: Payer: Self-pay | Admitting: Family Medicine

## 2017-06-24 ENCOUNTER — Ambulatory Visit (INDEPENDENT_AMBULATORY_CARE_PROVIDER_SITE_OTHER): Payer: Medicare Other | Admitting: Pharmacist Clinician (PhC)/ Clinical Pharmacy Specialist

## 2017-06-24 DIAGNOSIS — Z7901 Long term (current) use of anticoagulants: Secondary | ICD-10-CM | POA: Diagnosis not present

## 2017-06-24 DIAGNOSIS — I480 Paroxysmal atrial fibrillation: Secondary | ICD-10-CM | POA: Diagnosis not present

## 2017-06-24 LAB — POCT INR: INR: 4.3

## 2017-07-04 ENCOUNTER — Other Ambulatory Visit: Payer: Self-pay | Admitting: Family Medicine

## 2017-07-07 DIAGNOSIS — N3946 Mixed incontinence: Secondary | ICD-10-CM | POA: Diagnosis not present

## 2017-07-07 DIAGNOSIS — N3020 Other chronic cystitis without hematuria: Secondary | ICD-10-CM | POA: Diagnosis not present

## 2017-07-07 DIAGNOSIS — R350 Frequency of micturition: Secondary | ICD-10-CM | POA: Diagnosis not present

## 2017-07-08 ENCOUNTER — Ambulatory Visit (INDEPENDENT_AMBULATORY_CARE_PROVIDER_SITE_OTHER): Payer: Medicare Other | Admitting: Pharmacist Clinician (PhC)/ Clinical Pharmacy Specialist

## 2017-07-08 DIAGNOSIS — Z7901 Long term (current) use of anticoagulants: Secondary | ICD-10-CM

## 2017-07-08 DIAGNOSIS — I480 Paroxysmal atrial fibrillation: Secondary | ICD-10-CM | POA: Diagnosis not present

## 2017-07-08 LAB — POCT INR: INR: 2.2

## 2017-07-09 ENCOUNTER — Other Ambulatory Visit: Payer: Self-pay | Admitting: Family Medicine

## 2017-07-14 ENCOUNTER — Other Ambulatory Visit: Payer: Self-pay | Admitting: Family Medicine

## 2017-07-21 ENCOUNTER — Ambulatory Visit: Payer: Medicare Other | Admitting: Family Medicine

## 2017-07-23 ENCOUNTER — Telehealth: Payer: Self-pay | Admitting: Cardiology

## 2017-07-23 ENCOUNTER — Encounter: Payer: Medicare Other | Admitting: *Deleted

## 2017-07-23 NOTE — Telephone Encounter (Signed)
Confirmed remote transmission w/ pt.   

## 2017-07-24 ENCOUNTER — Ambulatory Visit (INDEPENDENT_AMBULATORY_CARE_PROVIDER_SITE_OTHER): Payer: Medicare Other | Admitting: *Deleted

## 2017-07-24 DIAGNOSIS — I495 Sick sinus syndrome: Secondary | ICD-10-CM

## 2017-07-30 ENCOUNTER — Telehealth: Payer: Self-pay | Admitting: Family Medicine

## 2017-07-30 NOTE — Telephone Encounter (Signed)
Copied from [place] [address][id number]. Topic: Quick Communication - See Telephone Encounter >> Jul 30, 2017 10:36 AM Gerrie Nordmann wrote: CRM for notification. See Telephone encounter for:  07/30/17.  Called pt to schedule AWV. Voicemail is full and cannot leave message. Last AWV 04/29/2016. Pt can schedule appt at anytime. SF

## 2017-08-01 ENCOUNTER — Encounter: Payer: Self-pay | Admitting: Cardiology

## 2017-08-01 NOTE — Progress Notes (Signed)
Remote pacemaker transmission.   

## 2017-08-01 NOTE — Progress Notes (Signed)
Letter  

## 2017-08-06 ENCOUNTER — Ambulatory Visit (INDEPENDENT_AMBULATORY_CARE_PROVIDER_SITE_OTHER): Payer: Medicare Other | Admitting: Pharmacist Clinician (PhC)/ Clinical Pharmacy Specialist

## 2017-08-06 DIAGNOSIS — I480 Paroxysmal atrial fibrillation: Secondary | ICD-10-CM

## 2017-08-06 DIAGNOSIS — Z7901 Long term (current) use of anticoagulants: Secondary | ICD-10-CM

## 2017-08-06 LAB — POCT INR: INR: 2.6

## 2017-08-12 LAB — CUP PACEART REMOTE DEVICE CHECK
Battery Remaining Longevity: 74 mo
Battery Remaining Percentage: 65 %
Battery Voltage: 2.9 V
Brady Statistic AS VS Percent: 1 %
Brady Statistic RA Percent Paced: 98 %
Date Time Interrogation Session: 20181011132623
Implantable Lead Implant Date: 19980729
Implantable Lead Location: 753859
Implantable Lead Location: 753860
Implantable Pulse Generator Implant Date: 20130514
Lead Channel Pacing Threshold Amplitude: 0.5 V
Lead Channel Pacing Threshold Pulse Width: 0.4 ms
Lead Channel Pacing Threshold Pulse Width: 0.4 ms
Lead Channel Sensing Intrinsic Amplitude: 2.8 mV
Lead Channel Setting Pacing Amplitude: 1.25 V
MDC IDC LEAD IMPLANT DT: 19980729
MDC IDC MSMT LEADCHNL RA IMPEDANCE VALUE: 360 Ohm
MDC IDC MSMT LEADCHNL RV IMPEDANCE VALUE: 440 Ohm
MDC IDC MSMT LEADCHNL RV PACING THRESHOLD AMPLITUDE: 1 V
MDC IDC MSMT LEADCHNL RV SENSING INTR AMPL: 12 mV
MDC IDC SET LEADCHNL RA PACING AMPLITUDE: 1.5 V
MDC IDC SET LEADCHNL RV PACING PULSEWIDTH: 0.4 ms
MDC IDC SET LEADCHNL RV SENSING SENSITIVITY: 4 mV
MDC IDC STAT BRADY AP VP PERCENT: 98 %
MDC IDC STAT BRADY AP VS PERCENT: 1 %
MDC IDC STAT BRADY AS VP PERCENT: 1.9 %
MDC IDC STAT BRADY RV PERCENT PACED: 99 %
Pulse Gen Serial Number: 7338831

## 2017-08-16 ENCOUNTER — Other Ambulatory Visit: Payer: Self-pay | Admitting: Family Medicine

## 2017-08-21 DIAGNOSIS — Z23 Encounter for immunization: Secondary | ICD-10-CM | POA: Diagnosis not present

## 2017-08-22 NOTE — Progress Notes (Deleted)
Subjective:   Jill Washington is a 83 y.o. female who presents for Medicare Annual (Subsequent) preventive examination.  Review of Systems: No ROS.  Medicare Wellness Visit. Additional risk factors are reflected in the social history.  Sleep patterns:    Female:      Mammo- No longer doing routine screening due to age.        Dexa scan-  Last 05/23/15-osteopenia           Objective:     Vitals: There were no vitals taken for this visit.  There is no height or weight on file to calculate BMI.   Tobacco Social History   Tobacco Use  Smoking Status Former Smoker  . Packs/day: 2.00  . Years: 6.00  . Pack years: 12.00  . Types: Cigarettes  . Last attempt to quit: 10/15/1951  . Years since quitting: 65.8  Smokeless Tobacco Never Used     Counseling given: Not Answered   Past Medical History:  Diagnosis Date  . Arthritis   . Blood transfusion   . CHB (complete heart block) ([place]) 11/10/2014  . Chicken pox as a child  . Colon polyps    hyperplastic  . Critical illness polyneuropathy ([place])   . Diarrhea 04/25/2017  . Diverticulosis of sigmoid colon 02/05/2007   mild  . Fecal incontinence   . Fibromyalgia   . GERD (gastroesophageal reflux disease)   . H/O measles   . H/O mumps   . Hiatal hernia   . History of atrial fibrillation   . History of chicken pox   . History of empyema of pleura   . History of gallstones   . History of skin cancer    face  . Hypercholesteremia   . Hypertension   . Hypothyroidism   . IBS (irritable bowel syndrome)   . Measles as a child  . Medicare annual wellness visit, subsequent 05/15/2015   Allergies verified: UTD   Immunization Status: Flu vaccine-- NA  Tdap-- 9-10 years ago per patient  PNA-- 09/30/07 (23)  Shingles-- 04/07/14   A/P:  Changes to [place], [place] or Personal Hx: UTD Pap-- none recent  MMG--declines further MMgs (last 08/12/03 BI-RADS 1: Neg)  Bone Density-- 08/11/03 with Prescott Gum, MD- Normal, declines further testing CCS--  05/07/12 with Silvano Rusk, MD- polyp removed- no   . Mitral valve prolapse   . Mumps as a child  . Overactive bladder 05/07/2011  . Pacemaker   . Peripheral neuropathy   . Shingles 8 yrs ago  . Sick sinus syndrome ([place])   . Urinary incontinence   . Venous insufficiency   . Vitamin D deficiency    Past Surgical History:  Procedure Laterality Date  . APPENDECTOMY  1971  . CARDIAC CATHETERIZATION  05/10/1997   Normal coronaries, MVP  . CERVICAL SPINE SURGERY    . CHOLECYSTECTOMY  03/2005   by Dr. Dalbert Batman  . COLONOSCOPY  02/05/2007,02/05/05   Dr. Silvano Rusk  . ESOPHAGOGASTRODUODENOSCOPY  02/05/2005,01/22/90   Dr. Silvano Rusk, Dr. Rachelle Hora  . NM MYOCAR PERF WALL MOTION  08/24/2010   Normal  . pacemaker implanted  05/11/97,08/02/05   [facility]  . TONSILLECTOMY    . TOTAL ABDOMINAL HYSTERECTOMY W/ BILATERAL SALPINGOOPHORECTOMY     Family History  Problem Relation Age of Onset  . Pancreatic cancer Mother   . Diabetes Mother 25       type 2  . Heart attack Brother   . Diabetes Brother  type 2  . Gallbladder disease Father   . Diabetes Father        type 2  . Diabetes Brother        X 2   . Cancer Sister 80       skin ca on face  . Diabetes Sister        type 2  . Diabetes Daughter        type 2  . Hypertension Daughter   . Alcohol abuse Daughter   . Diabetes Son        type 2  . Diabetes Sister        type 1  . Hypertension Sister   . Stomach cancer Maternal Aunt   . Colon cancer Unknown    Social History   Substance and Sexual Activity  Sexual Activity Not on file    Outpatient Encounter Medications as of 08/29/2017  Medication Sig  . atenolol (TENORMIN) 50 MG tablet TAKE ONE AND ONE-HALF TABLETS DAILY  . cholecalciferol (VITAMIN D) 1000 UNITS tablet Take 1,000 Units by mouth daily.  . furosemide (LASIX) 20 MG tablet TAKE 1 TABLET DAILY  . gabapentin (NEURONTIN) 300 MG capsule TAKE 1 CAPSULE TWICE A DAY  . MYRBETRIQ 50 MG TB24 tablet Take 1 tablet  by mouth daily.  . nitrofurantoin, macrocrystal-monohydrate, (MACROBID) 100 MG capsule Take 1 capsule (100 mg total) by mouth 2 (two) times daily. Take for 5 days  . omeprazole (PRILOSEC) 40 MG capsule TAKE 1 CAPSULE DAILY  . PACERONE 200 MG tablet TAKE ONE-HALF (1/2) TABLET DAILY  . polyvinyl alcohol (ARTIFICIAL TEARS) 1.4 % ophthalmic solution Place 1 drop into both eyes 3 (three) times daily as needed for dry eyes.  . pravastatin (PRAVACHOL) 40 MG tablet TAKE 1 TABLET DAILY  . SYNTHROID 137 MCG tablet TAKE 1 TABLET DAILY  . traMADol (ULTRAM) 50 MG tablet Take 1 tablet (50 mg total) by mouth daily as needed for severe pain.  Marland Kitchen trimethoprim (TRIMPEX) 100 MG tablet Take 100 mg by mouth daily.  . valsartan-hydrochlorothiazide (DIOVAN-HCT) 80-12.5 MG tablet TAKE 1 TABLET DAILY  . warfarin (COUMADIN) 2 MG tablet TAKE 1 TABLET DAILY OR AS DIRECTED   No facility-administered encounter medications on file as of 08/29/2017.     Activities of Daily Living No flowsheet data found.  Patient Care Team: Mosie Lukes, MD as PCP - General (Family Medicine) Croitoru, Dani Gobble, MD as Consulting Physician (Cardiology) Lynnell Dike, OD as Consulting Physician (Optometry)    Assessment:    Physical assessment deferred to PCP.  Exercise Activities and Dietary recommendations   Diet (meal preparation, eat out, water intake, caffeinated beverages, dairy products, fruits and vegetables): {Desc; diets:16563} Breakfast: Lunch:  Dinner:      Goals    None     Fall Risk Fall Risk  04/29/2016 04/28/2015 08/11/2013  Falls in the past year? Yes Yes No  Number falls in past yr: 2 or more 2 or more -  Injury with Fall? Yes No -  Risk Factor Category  High Fall Risk - -  Risk for fall due to : History of fall(s);Impaired balance/gait - -  Risk for fall due to: Comment Pt has rolling walker, but does not use because it is too heavy for her to lift in and out of her car and too big for use in her house.  Encouraged PT eval, pt will consider and let Dr. Charlett Blake know at follow-up appt next week. - -  Follow  up Education provided;Falls prevention discussed;Follow up appointment - -   Depression Screen PHQ 2/9 Scores 04/29/2016 04/28/2015 02/09/2013  PHQ - 2 Score 1 0 0     Cognitive Function MMSE - Mini Mental State Exam 04/29/2016  Orientation to time 5  Orientation to Place 5  Registration 3  Attention/ Calculation 5  Recall 3  Language- name 2 objects 2  Language- repeat 1  Language- follow 3 step command 3  Language- read & follow direction 1  Write a sentence 1  Copy design 1  Total score 30        Immunization History  Administered Date(s) Administered  . Influenza Split 07/17/2011, 07/31/2012, 07/28/2013  . Influenza Whole 10/17/2009, 08/27/2010  . Influenza,inj,Quad PF,6+ Mos 08/08/2015  . Influenza-Unspecified 07/04/2016  . Pneumococcal Conjugate-13 10/30/2015  . Pneumococcal Polysaccharide-23 09/30/2007  . Tdap 10/30/2015  . Zoster 04/07/2014   Screening Tests Health Maintenance  Topic Date Due  . INFLUENZA VACCINE  05/14/2017  . TETANUS/TDAP  10/29/2025  . DEXA SCAN  Completed  . PNA vac Low Risk Adult  Completed      Plan:   ***   I have personally reviewed and noted the following in the patient's chart:   . Medical and social history . Use of alcohol, tobacco or illicit drugs  . Current medications and supplements . Functional ability and status . Nutritional status . Physical activity . Advanced directives . List of other physicians . Hospitalizations, surgeries, and ER visits in previous 12 months . Vitals . Screenings to include cognitive, depression, and falls . Referrals and appointments  In addition, I have reviewed and discussed with patient certain preventive protocols, quality metrics, and best practice recommendations. A written personalized care plan for preventive services as well as general preventive health recommendations were  provided to patient.     Shela Nevin, [place]  08/22/2017

## 2017-08-29 ENCOUNTER — Ambulatory Visit: Payer: Medicare Other | Admitting: *Deleted

## 2017-08-29 ENCOUNTER — Ambulatory Visit: Payer: Medicare Other | Admitting: Family Medicine

## 2017-09-03 ENCOUNTER — Ambulatory Visit (INDEPENDENT_AMBULATORY_CARE_PROVIDER_SITE_OTHER): Payer: Medicare Other | Admitting: Pharmacist Clinician (PhC)/ Clinical Pharmacy Specialist

## 2017-09-03 DIAGNOSIS — I480 Paroxysmal atrial fibrillation: Secondary | ICD-10-CM

## 2017-09-03 DIAGNOSIS — Z7901 Long term (current) use of anticoagulants: Secondary | ICD-10-CM

## 2017-09-03 LAB — POCT INR: INR: 2.9

## 2017-09-15 ENCOUNTER — Ambulatory Visit (INDEPENDENT_AMBULATORY_CARE_PROVIDER_SITE_OTHER): Payer: Medicare Other | Admitting: Family Medicine

## 2017-09-15 ENCOUNTER — Encounter: Payer: Self-pay | Admitting: Family Medicine

## 2017-09-15 VITALS — BP 138/82 | HR 73 | Temp 98.0°F | Resp 18 | Wt 194.0 lb

## 2017-09-15 DIAGNOSIS — M25562 Pain in left knee: Secondary | ICD-10-CM

## 2017-09-15 DIAGNOSIS — M25561 Pain in right knee: Secondary | ICD-10-CM

## 2017-09-15 DIAGNOSIS — E559 Vitamin D deficiency, unspecified: Secondary | ICD-10-CM

## 2017-09-15 DIAGNOSIS — E039 Hypothyroidism, unspecified: Secondary | ICD-10-CM | POA: Diagnosis not present

## 2017-09-15 DIAGNOSIS — Z0000 Encounter for general adult medical examination without abnormal findings: Secondary | ICD-10-CM

## 2017-09-15 DIAGNOSIS — E538 Deficiency of other specified B group vitamins: Secondary | ICD-10-CM | POA: Diagnosis not present

## 2017-09-15 DIAGNOSIS — Z79899 Other long term (current) drug therapy: Secondary | ICD-10-CM | POA: Diagnosis not present

## 2017-09-15 DIAGNOSIS — E7800 Pure hypercholesterolemia, unspecified: Secondary | ICD-10-CM | POA: Diagnosis not present

## 2017-09-15 DIAGNOSIS — I10 Essential (primary) hypertension: Secondary | ICD-10-CM

## 2017-09-15 MED ORDER — TRAMADOL HCL 50 MG PO TABS: 50.0000 mg | ORAL_TABLET | Freq: Every day | ORAL | 0 refills | Status: DC | PRN

## 2017-09-15 NOTE — Assessment & Plan Note (Signed)
Check level today 

## 2017-09-15 NOTE — Assessment & Plan Note (Signed)
Tolerating statin, encouraged heart healthy diet, avoid trans fats, minimize simple carbs and saturated fats. Increase exercise as tolerated 

## 2017-09-15 NOTE — Assessment & Plan Note (Signed)
Daily supplements encouraged.

## 2017-09-15 NOTE — Assessment & Plan Note (Signed)
Well controlled, no changes to meds. Encouraged heart healthy diet such as the DASH diet and exercise as tolerated.  °

## 2017-09-15 NOTE — Assessment & Plan Note (Addendum)
Right knee pain, try Lidocaine gel, may continue Tramadol use sparingly for severe pain. Given refill today.

## 2017-09-15 NOTE — Progress Notes (Signed)
Subjective:  I acted as a Education administrator for Dr. Charlett Blake. [name], [place]  Patient ID: Jill Washington, female    DOB: 1926-06-15, 83 y.o.   MRN: [id number]  Chief Complaint  Patient presents with  . Medicare Wellness    with RN    HPI  Patient is in today for a follow up and is accompanied by her sister.  Overall she reports she is doing well.  No recent febrile illness or hospitalization.  She continues to struggle with daily pain in her knees most notably on the right.  She has had cortisone injections placed by [facility] orthopedics and they help temporarily but she is no longer do for more shots.  Tramadol gives some relief so she is allowed a refill.  No recent falls or trauma.  She is struggling with some hearing loss.  Is considering hearing aids. Denies CP/palp/SOB/HA/congestion/fevers/GI or GU c/o. Taking meds as prescribed  Patient Care Team: Mosie Lukes, MD as PCP - General (Family Medicine) Croitoru, Dani Gobble, MD as Consulting Physician (Cardiology) Lynnell Dike, OD as Consulting Physician (Optometry)   Past Medical History:  Diagnosis Date  . Arthritis   . Blood transfusion   . CHB (complete heart block) ([place]) 11/10/2014  . Chicken pox as a child  . Colon polyps    hyperplastic  . Critical illness polyneuropathy ([place])   . Diarrhea 04/25/2017  . Diverticulosis of sigmoid colon 02/05/2007   mild  . Fecal incontinence   . Fibromyalgia   . GERD (gastroesophageal reflux disease)   . H/O measles   . H/O mumps   . Hiatal hernia   . History of atrial fibrillation   . History of chicken pox   . History of empyema of pleura   . History of gallstones   . History of skin cancer    face  . Hypercholesteremia   . Hypertension   . Hypothyroidism   . IBS (irritable bowel syndrome)   . Measles as a child  . Medicare annual wellness visit, subsequent 05/15/2015   Allergies verified: UTD   Immunization Status: Flu vaccine-- NA  Tdap-- 9-10 years ago per patient  PNA-- 09/30/07 (23)   Shingles-- 04/07/14   A/P:  Changes to [place], [place] or Personal Hx: UTD Pap-- none recent  MMG--declines further MMgs (last 08/12/03 BI-RADS 1: Neg)  Bone Density-- 08/11/03 with Prescott Gum, MD- Normal, declines further testing CCS-- 05/07/12 with Silvano Rusk, MD- polyp removed- no   . Mitral valve prolapse   . Mumps as a child  . Overactive bladder 05/07/2011  . Pacemaker   . Peripheral neuropathy   . Shingles 8 yrs ago  . Sick sinus syndrome ([place])   . Urinary incontinence   . Venous insufficiency   . Vitamin D deficiency     Past Surgical History:  Procedure Laterality Date  . APPENDECTOMY  1971  . CARDIAC CATHETERIZATION  05/10/1997   Normal coronaries, MVP  . CERVICAL SPINE SURGERY    . CHOLECYSTECTOMY  03/2005   by Dr. Dalbert Batman  . COLONOSCOPY  02/05/2007,02/05/05   Dr. Silvano Rusk  . ESOPHAGOGASTRODUODENOSCOPY  02/05/2005,01/22/90   Dr. Silvano Rusk, Dr. Rachelle Hora  . NM MYOCAR PERF WALL MOTION  08/24/2010   Normal  . PACEMAKER GENERATOR CHANGE N/A 02/25/2012   Procedure: PACEMAKER GENERATOR CHANGE;  Surgeon: Sanda Klein, MD;  Location: [facility] CATH [facility];  Service: Cardiovascular;  Laterality: N/A;  . pacemaker implanted  05/11/97,08/02/05   [facility]  . TONSILLECTOMY    .  TOTAL ABDOMINAL HYSTERECTOMY W/ BILATERAL SALPINGOOPHORECTOMY      Family History  Problem Relation Age of Onset  . Pancreatic cancer Mother   . Diabetes Mother 51       type 2  . Heart attack Brother   . Diabetes Brother        type 2  . Gallbladder disease Father   . Diabetes Father        type 2  . Diabetes Brother        X 2   . Cancer Sister 80       skin ca on face  . Diabetes Sister        type 2  . Diabetes Daughter        type 2  . Hypertension Daughter   . Alcohol abuse Daughter   . Diabetes Son        type 2  . Diabetes Sister        type 1  . Hypertension Sister   . Stomach cancer Maternal Aunt   . Colon cancer Unknown     Social History   Socioeconomic History  . Marital  status: Widowed    Spouse name: Not on file  . Number of children: 3  . Years of education: Not on file  . Highest education level: Not on file  Social Needs  . Financial resource strain: Not on file  . Food insecurity - worry: Not on file  . Food insecurity - inability: Not on file  . Transportation needs - medical: Not on file  . Transportation needs - non-medical: Not on file  Occupational History  . Occupation: retired    Fish farm manager: RETIRED  Tobacco Use  . Smoking status: Former Smoker    Packs/day: 2.00    Years: 6.00    Pack years: 12.00    Types: Cigarettes    Last attempt to quit: 10/15/1951    Years since quitting: 65.9  . Smokeless tobacco: Never Used  Substance and Sexual Activity  . Alcohol use: No  . Drug use: No  . Sexual activity: Not on file  Other Topics Concern  . Not on file  Social History Narrative   She is a widow, one son to daughters. She is retired.    Outpatient Medications Prior to Visit  Medication Sig Dispense Refill  . atenolol (TENORMIN) 50 MG tablet TAKE ONE AND ONE-HALF TABLETS DAILY 135 tablet 3  . cholecalciferol (VITAMIN D) 1000 UNITS tablet Take 1,000 Units by mouth daily.    . furosemide (LASIX) 20 MG tablet TAKE 1 TABLET DAILY 90 tablet 0  . gabapentin (NEURONTIN) 300 MG capsule TAKE 1 CAPSULE TWICE A DAY 180 capsule 1  . MYRBETRIQ 50 MG TB24 tablet Take 1 tablet by mouth daily.    . nitrofurantoin, macrocrystal-monohydrate, (MACROBID) 100 MG capsule Take 1 capsule (100 mg total) by mouth 2 (two) times daily. Take for 5 days 10 capsule 0  . omeprazole (PRILOSEC) 40 MG capsule TAKE 1 CAPSULE DAILY 90 capsule 1  . PACERONE 200 MG tablet TAKE ONE-HALF (1/2) TABLET DAILY 45 tablet 3  . polyvinyl alcohol (ARTIFICIAL TEARS) 1.4 % ophthalmic solution Place 1 drop into both eyes 3 (three) times daily as needed for dry eyes.    . pravastatin (PRAVACHOL) 40 MG tablet TAKE 1 TABLET DAILY 90 tablet 0  . SYNTHROID 137 MCG tablet TAKE 1 TABLET DAILY  90 tablet 2  . trimethoprim (TRIMPEX) 100 MG tablet Take 100 mg by  mouth daily.    . valsartan-hydrochlorothiazide (DIOVAN-HCT) 80-12.5 MG tablet TAKE 1 TABLET DAILY 90 tablet 3  . warfarin (COUMADIN) 2 MG tablet TAKE 1 TABLET DAILY OR AS DIRECTED 90 tablet 1  . traMADol (ULTRAM) 50 MG tablet Take 1 tablet (50 mg total) by mouth daily as needed for severe pain. 90 tablet 0   No facility-administered medications prior to visit.     Allergies  Allergen Reactions  . Iodinated Diagnostic Agents Other (See Comments)    Pt was not told what the reaction was  . Cymbalta [Duloxetine Hcl] Other (See Comments)    Could not think straight  . Other Itching    Dial Soap    Review of Systems  Constitutional: Negative for fever and malaise/fatigue.  HENT: Positive for hearing loss. Negative for congestion.   Eyes: Negative for blurred vision.  Respiratory: Negative for shortness of breath.   Cardiovascular: Negative for chest pain, palpitations and leg swelling.  Gastrointestinal: Negative for abdominal pain, blood in stool and nausea.  Genitourinary: Negative for dysuria and frequency.  Musculoskeletal: Positive for joint pain. Negative for falls.  Skin: Negative for rash.  Neurological: Negative for dizziness, loss of consciousness and headaches.  Endo/Heme/Allergies: Negative for environmental allergies.  Psychiatric/Behavioral: Negative for depression. The patient is not nervous/anxious.        Objective:    Physical Exam  Constitutional: She is oriented to person, place, and time. She appears well-developed and well-nourished. No distress.  HENT:  Head: Normocephalic and atraumatic.  Nose: Nose normal.  Eyes: Right eye exhibits no discharge. Left eye exhibits no discharge.  Neck: Normal range of motion. Neck supple.  Cardiovascular: Normal rate and regular rhythm.  No murmur heard. Pulmonary/Chest: Effort normal and breath sounds normal.  Abdominal: Soft. Bowel sounds are normal.  There is no tenderness.  Musculoskeletal: She exhibits no edema.  Neurological: She is alert and oriented to person, place, and time.  Skin: Skin is warm and dry.  Psychiatric: She has a normal mood and affect.  Nursing note and vitals reviewed.   BP 138/82 (BP Location: Left Arm, Patient Position: Sitting, Cuff Size: Normal)   Pulse 73   Temp 98 F (36.7 C) (Oral)   Resp 18   Wt 194 lb (88 kg)   SpO2 97%   BMI 31.31 kg/m  Wt Readings from Last 3 Encounters:  09/15/17 194 lb (88 kg)  05/20/17 189 lb 6.4 oz (85.9 kg)  04/24/17 188 lb (85.3 kg)   BP Readings from Last 3 Encounters:  09/15/17 138/82  05/20/17 120/70  04/24/17 118/80     Immunization History  Administered Date(s) Administered  . Influenza Split 07/17/2011, 07/31/2012, 07/28/2013  . Influenza Whole 10/17/2009, 08/27/2010  . Influenza,inj,Quad PF,6+ Mos 08/08/2015  . Influenza-Unspecified 07/04/2016  . Pneumococcal Conjugate-13 10/30/2015  . Pneumococcal Polysaccharide-23 09/30/2007  . Tdap 10/30/2015  . Zoster 04/07/2014    Health Maintenance  Topic Date Due  . INFLUENZA VACCINE  05/14/2017  . TETANUS/TDAP  10/29/2025  . DEXA SCAN  Completed  . PNA vac Low Risk Adult  Completed    Lab Results  Component Value Date   WBC 7.0 05/20/2017   HGB 12.1 05/20/2017   HCT 36.9 05/20/2017   PLT 219.0 05/20/2017   GLUCOSE 94 05/20/2017   CHOL 158 10/25/2016   TRIG 192.0 (H) 10/25/2016   HDL 38.90 (L) 10/25/2016   LDLDIRECT 77.0 04/28/2015   LDLCALC 80 10/25/2016   ALT 8 05/20/2017   AST 15  05/20/2017   NA 140 05/20/2017   K 4.8 05/20/2017   CL 109 05/20/2017   CREATININE 2.34 (H) 05/20/2017   BUN 23 05/20/2017   CO2 26 05/20/2017   TSH 1.59 10/25/2016   INR 2.9 09/03/2017   HGBA1C 5.3 08/02/2015    Lab Results  Component Value Date   TSH 1.59 10/25/2016   Lab Results  Component Value Date   WBC 7.0 05/20/2017   HGB 12.1 05/20/2017   HCT 36.9 05/20/2017   MCV 93.8 05/20/2017   PLT 219.0  05/20/2017   Lab Results  Component Value Date   NA 140 05/20/2017   K 4.8 05/20/2017   CO2 26 05/20/2017   GLUCOSE 94 05/20/2017   BUN 23 05/20/2017   CREATININE 2.34 (H) 05/20/2017   BILITOT 0.2 05/20/2017   ALKPHOS 58 05/20/2017   AST 15 05/20/2017   ALT 8 05/20/2017   PROT 6.7 05/20/2017   ALBUMIN 3.7 05/20/2017   CALCIUM 9.4 05/20/2017   ANIONGAP 14 08/09/2015   GFR 20.81 (L) 05/20/2017   Lab Results  Component Value Date   CHOL 158 10/25/2016   Lab Results  Component Value Date   HDL 38.90 (L) 10/25/2016   Lab Results  Component Value Date   LDLCALC 80 10/25/2016   Lab Results  Component Value Date   TRIG 192.0 (H) 10/25/2016   Lab Results  Component Value Date   CHOLHDL 4 10/25/2016   Lab Results  Component Value Date   HGBA1C 5.3 08/02/2015         Assessment & Plan:   Problem List Items Addressed This Visit    Hypothyroidism    On Levothyroxine, continue to monitor      Relevant Orders   TSH   Vitamin D deficiency    Daily supplements encouraged.       Relevant Orders   VITAMIN D 25 Hydroxy (Vit-D Deficiency, Fractures)   HYPERCHOLESTEROLEMIA    Tolerating statin, encouraged heart healthy diet, avoid trans fats, minimize simple carbs and saturated fats. Increase exercise as tolerated      Relevant Orders   Lipid panel   Essential hypertension    Well controlled, no changes to meds. Encouraged heart healthy diet such as the DASH diet and exercise as tolerated.       Relevant Orders   CBC   Comprehensive metabolic panel   L93 deficiency    Check level today      Relevant Orders   Vitamin B12   Knee pain, bilateral    Right knee pain, try Lidocaine gel, may continue Tramadol use sparingly for severe pain. Given refill today.        Other Visit Diagnoses    High risk medication use    -  Primary   Relevant Orders   Pain Mgmt, Profile 8 w/Conf, U   Encounter for Medicare annual wellness exam          I am having  Daneille E. Mauss maintain her cholecalciferol, polyvinyl alcohol, trimethoprim, MYRBETRIQ, warfarin, valsartan-hydrochlorothiazide, atenolol, PACERONE, nitrofurantoin (macrocrystal-monohydrate), omeprazole, SYNTHROID, pravastatin, gabapentin, furosemide, and traMADol.  Meds ordered this encounter  Medications  . traMADol (ULTRAM) 50 MG tablet    Sig: Take 1 tablet (50 mg total) by mouth daily as needed for severe pain.    Dispense:  90 tablet    Refill:  0    CMA served as scribe during this visit. History, Physical and Plan performed by medical provider. Documentation and orders reviewed and  attested to.  [name], MD

## 2017-09-15 NOTE — Assessment & Plan Note (Signed)
On Levothyroxine, continue to monitor 

## 2017-09-15 NOTE — Patient Instructions (Addendum)
Lidocaine gel or patches made by [employer]percre[employer], [employer] or [employer] all make this  Tylenol/Acetaminoph ES 1-2 tabs twice every day Hypertension Hypertension is another name for high blood pressure. High blood pressure forces your heart to work harder to pump blood. This can cause problems over time. There are two numbers in a blood pressure reading. There is a top number (systolic) over a bottom number (diastolic). It is best to have a blood pressure below 120/80. Healthy choices can help lower your blood pressure. You may need medicine to help lower your blood pressure if:  Your blood pressure cannot be lowered with healthy choices.  Your blood pressure is higher than 130/80.  Follow these instructions at home: Eating and drinking  If directed, follow the DASH eating plan. This diet includes: ? Filling half of your plate at each meal with fruits and vegetables. ? Filling one quarter of your plate at each meal with whole grains. Whole grains include whole wheat pasta, brown rice, and whole grain bread. ? Eating or drinking low-fat dairy products, such as skim milk or low-fat yogurt. ? Filling one quarter of your plate at each meal with low-fat (lean) proteins. Low-fat proteins include fish, skinless chicken, eggs, beans, and tofu. ? Avoiding fatty meat, cured and processed meat, or chicken with skin. ? Avoiding premade or processed food.  Eat less than 1,500 mg of salt (sodium) a day.  Limit alcohol use to no more than 1 drink a day for nonpregnant women and 2 drinks a day for men. One drink equals 12 oz of beer, 5 oz of wine, or 1 oz of hard liquor. Lifestyle  Work with your doctor to stay at a healthy weight or to lose weight. Ask your doctor what the best weight is for you.  Get at least 30 minutes of exercise that causes your heart to beat faster (aerobic exercise) most days of the week. This may include walking, swimming, or biking.  Get at least 30 minutes of exercise that  strengthens your muscles (resistance exercise) at least 3 days a week. This may include lifting weights or pilates.  Do not use any products that contain nicotine or tobacco. This includes cigarettes and e-cigarettes. If you need help quitting, ask your doctor.  Check your blood pressure at home as told by your doctor.  Keep all follow-up visits as told by your doctor. This is important. Medicines  Take over-the-counter and prescription medicines only as told by your doctor. Follow directions carefully.  Do not skip doses of blood pressure medicine. The medicine does not work as well if you skip doses. Skipping doses also puts you at risk for problems.  Ask your doctor about side effects or reactions to medicines that you should watch for. Contact a doctor if:  You think you are having a reaction to the medicine you are taking.  You have headaches that keep coming back (recurring).  You feel dizzy.  You have swelling in your ankles.  You have trouble with your vision. Get help right away if:  You get a very bad headache.  You start to feel confused.  You feel weak or numb.  You feel faint.  You get very bad pain in your: ? Chest. ? Belly (abdomen).  You throw up (vomit) more than once.  You have trouble breathing. Summary  Hypertension is another name for high blood pressure.  Making healthy choices can help lower blood pressure. If your blood pressure cannot be controlled with  healthy choices, you may need to take medicine. This information is not intended to replace advice given to you by your health care provider. Make sure you discuss any questions you have with your health care provider. Document Released: 03/18/2008 Document Revised: 08/28/2016 Document Reviewed: 08/28/2016 Elsevier Interactive Patient Education  2018 [employer].   Increase fluid intake, nasal saline flushes and plain mucinex twice daily  Continue to eat heart healthy diet (full of  fruits, vegetables, whole grains, lean protein, water--limit salt, fat, and sugar intake) and increase physical activity as tolerated.  Continue doing brain stimulating activities (puzzles, reading, adult coloring books, staying active) to keep memory sharp.

## 2017-09-15 NOTE — Progress Notes (Signed)
Subjective:   Jill Washington is a 83 y.o. female who presents for Medicare Annual (Subsequent) preventive examination.  Review of Systems:  No ROS.  Medicare Wellness Visit. Additional risk factors are reflected in the social history.  Cardiac Risk Factors include: advanced age (>54men, >30 women);hypertension Sleep patterns: Sleeps well 6-8 hrs Home Safety/Smoke Alarms: Feels safe in home. Smoke alarms in place. Lives alone. 13 steps off of deck.    Female:    Mammo- declines      Dexa scan-   Last 05/23/15-osteopenia         Objective:     Vitals: BP 138/82 (BP Location: Left Arm, Patient Position: Sitting, Cuff Size: Normal)   Pulse 73   Temp 98 F (36.7 C) (Oral)   Resp 18   Wt 194 lb (88 kg)   SpO2 97%   BMI 31.31 kg/m   Body mass index is 31.31 kg/m.  Advanced Directives 09/15/2017 01/02/2017 04/29/2016 08/01/2015 05/15/2015 02/25/2012  Does Patient Have a Medical Advance Directive? No No No No No Patient does not have advance directive  Would patient like information on creating a medical advance directive? No - Patient declined - Yes - Scientist, clinical (histocompatibility and immunogenetics) given No - patient declined information Yes Higher education careers adviser given -    Tobacco Social History   Tobacco Use  Smoking Status Former Smoker  . Packs/day: 2.00  . Years: 6.00  . Pack years: 12.00  . Types: Cigarettes  . Last attempt to quit: 10/15/1951  . Years since quitting: 65.9  Smokeless Tobacco Never Used     Counseling given: Not Answered   Clinical Intake:     Pain : No/denies pain     Past Medical History:  Diagnosis Date  . Arthritis   . Blood transfusion   . CHB (complete heart block) ([place]) 11/10/2014  . Chicken pox as a child  . Colon polyps    hyperplastic  . Critical illness polyneuropathy ([place])   . Diarrhea 04/25/2017  . Diverticulosis of sigmoid colon 02/05/2007   mild  . Fecal incontinence   . Fibromyalgia   . GERD (gastroesophageal reflux disease)   . H/O measles    . H/O mumps   . Hiatal hernia   . History of atrial fibrillation   . History of chicken pox   . History of empyema of pleura   . History of gallstones   . History of skin cancer    face  . Hypercholesteremia   . Hypertension   . Hypothyroidism   . IBS (irritable bowel syndrome)   . Measles as a child  . Medicare annual wellness visit, subsequent 05/15/2015   Allergies verified: UTD   Immunization Status: Flu vaccine-- NA  Tdap-- 9-10 years ago per patient  PNA-- 09/30/07 (23)  Shingles-- 04/07/14   A/P:  Changes to [place], [place] or Personal Hx: UTD Pap-- none recent  MMG--declines further MMgs (last 08/12/03 BI-RADS 1: Neg)  Bone Density-- 08/11/03 with Prescott Gum, MD- Normal, declines further testing CCS-- 05/07/12 with Silvano Rusk, MD- polyp removed- no   . Mitral valve prolapse   . Mumps as a child  . Overactive bladder 05/07/2011  . Pacemaker   . Peripheral neuropathy   . Shingles 8 yrs ago  . Sick sinus syndrome ([place])   . Urinary incontinence   . Venous insufficiency   . Vitamin D deficiency    Past Surgical History:  Procedure Laterality Date  . APPENDECTOMY  1971  . CARDIAC  CATHETERIZATION  05/10/1997   Normal coronaries, MVP  . CERVICAL SPINE SURGERY    . CHOLECYSTECTOMY  03/2005   by Dr. Dalbert Batman  . COLONOSCOPY  02/05/2007,02/05/05   Dr. Silvano Rusk  . ESOPHAGOGASTRODUODENOSCOPY  02/05/2005,01/22/90   Dr. Silvano Rusk, Dr. Rachelle Hora  . NM MYOCAR PERF WALL MOTION  08/24/2010   Normal  . PACEMAKER GENERATOR CHANGE N/A 02/25/2012   Procedure: PACEMAKER GENERATOR CHANGE;  Surgeon: Sanda Klein, MD;  Location: [facility];  Service: Cardiovascular;  Laterality: N/A;  . pacemaker implanted  05/11/97,08/02/05   [facility]  . TONSILLECTOMY    . TOTAL ABDOMINAL HYSTERECTOMY W/ BILATERAL SALPINGOOPHORECTOMY     Family History  Problem Relation Age of Onset  . Pancreatic cancer Mother   . Diabetes Mother 56       type 2  . Heart attack Brother   . Diabetes Brother        type  2  . Gallbladder disease Father   . Diabetes Father        type 2  . Diabetes Brother        X 2   . Cancer Sister 80       skin ca on face  . Diabetes Sister        type 2  . Diabetes Daughter        type 2  . Hypertension Daughter   . Alcohol abuse Daughter   . Diabetes Son        type 2  . Diabetes Sister        type 1  . Hypertension Sister   . Stomach cancer Maternal Aunt   . Colon cancer Unknown    Social History   Socioeconomic History  . Marital status: Widowed    Spouse name: None  . Number of children: 3  . Years of education: None  . Highest education level: None  Social Needs  . Financial resource strain: None  . Food insecurity - worry: None  . Food insecurity - inability: None  . Transportation needs - medical: None  . Transportation needs - non-medical: None  Occupational History  . Occupation: retired    Fish farm manager: RETIRED  Tobacco Use  . Smoking status: Former Smoker    Packs/day: 2.00    Years: 6.00    Pack years: 12.00    Types: Cigarettes    Last attempt to quit: 10/15/1951    Years since quitting: 65.9  . Smokeless tobacco: Never Used  Substance and Sexual Activity  . Alcohol use: No  . Drug use: No  . Sexual activity: None  Other Topics Concern  . None  Social History Narrative   She is a widow, one son to daughters. She is retired.    Outpatient Encounter Medications as of 09/15/2017  Medication Sig  . atenolol (TENORMIN) 50 MG tablet TAKE ONE AND ONE-HALF TABLETS DAILY  . cholecalciferol (VITAMIN D) 1000 UNITS tablet Take 1,000 Units by mouth daily.  . furosemide (LASIX) 20 MG tablet TAKE 1 TABLET DAILY  . gabapentin (NEURONTIN) 300 MG capsule TAKE 1 CAPSULE TWICE A DAY  . MYRBETRIQ 50 MG TB24 tablet Take 1 tablet by mouth daily.  . nitrofurantoin, macrocrystal-monohydrate, (MACROBID) 100 MG capsule Take 1 capsule (100 mg total) by mouth 2 (two) times daily. Take for 5 days  . omeprazole (PRILOSEC) 40 MG capsule TAKE 1 CAPSULE  DAILY  . PACERONE 200 MG tablet TAKE ONE-HALF (1/2) TABLET DAILY  . polyvinyl alcohol (ARTIFICIAL  TEARS) 1.4 % ophthalmic solution Place 1 drop into both eyes 3 (three) times daily as needed for dry eyes.  . pravastatin (PRAVACHOL) 40 MG tablet TAKE 1 TABLET DAILY  . SYNTHROID 137 MCG tablet TAKE 1 TABLET DAILY  . traMADol (ULTRAM) 50 MG tablet Take 1 tablet (50 mg total) by mouth daily as needed for severe pain.  Marland Kitchen trimethoprim (TRIMPEX) 100 MG tablet Take 100 mg by mouth daily.  . valsartan-hydrochlorothiazide (DIOVAN-HCT) 80-12.5 MG tablet TAKE 1 TABLET DAILY  . warfarin (COUMADIN) 2 MG tablet TAKE 1 TABLET DAILY OR AS DIRECTED  . [DISCONTINUED] traMADol (ULTRAM) 50 MG tablet Take 1 tablet (50 mg total) by mouth daily as needed for severe pain.   No facility-administered encounter medications on file as of 09/15/2017.     Activities of Daily Living In your present state of health, do you have any difficulty performing the following activities: 09/15/2017  Hearing? Y  Comment has had hearing aids but does not wear them. HOH  Vision? N  Comment wear reading glasses, hx of cataract sx.  Difficulty concentrating or making decisions? N  Walking or climbing stairs? N  Comment poor balance  Dressing or bathing? N  Doing errands, shopping? N  Preparing Food and eating ? N  Using the Toilet? N  In the past six months, have you accidently leaked urine? Y  Comment wears pads  Do you have problems with loss of bowel control? N  Managing your Medications? N  Managing your Finances? N  Housekeeping or managing your Housekeeping? N  Some recent data might be hidden     Patient Care Team: Mosie Lukes, MD as PCP - General (Family Medicine) Croitoru, Dani Gobble, MD as Consulting Physician (Cardiology) Lynnell Dike, OD as Consulting Physician (Optometry)    Assessment:    Physical assessment deferred to PCP.  Exercise Activities and Dietary recommendations Current Exercise Habits: The  patient does not participate in regular exercise at present, Exercise limited by: orthopedic condition(s) Diet (meal preparation, eat out, water intake, caffeinated beverages, dairy products, fruits and vegetables): well balanced  Goals    . Maintain Independence      Fall Risk Fall Risk  09/15/2017 04/29/2016 04/28/2015 08/11/2013  Falls in the past year? No Yes Yes No  Number falls in past yr: - 2 or more 2 or more -  Injury with Fall? - Yes No -  Risk Factor Category  - High Fall Risk - -  Risk for fall due to : - History of fall(s);Impaired balance/gait - -  Risk for fall due to: Comment - Pt has rolling walker, but does not use because it is too heavy for her to lift in and out of her car and too big for use in her house. Encouraged PT eval, pt will consider and let Dr. Charlett Blake know at follow-up appt next week. - -  Follow up - Education provided;Falls prevention discussed;Follow up appointment - -     Depression Screen PHQ 2/9 Scores 09/15/2017 04/29/2016 04/28/2015 02/09/2013  PHQ - 2 Score 0 1 0 0     Cognitive Function MMSE - Mini Mental State Exam 09/15/2017 04/29/2016  Orientation to time 5 5  Orientation to Place 5 5  Registration 3 3  Attention/ Calculation 5 5  Recall 3 3  Language- name 2 objects 2 2  Language- repeat 1 1  Language- follow 3 step command 3 3  Language- read & follow direction 1 1  Write a sentence  1 1  Copy design 1 1  Total score 30 30        Immunization History  Administered Date(s) Administered  . Influenza Split 07/17/2011, 07/31/2012, 07/28/2013  . Influenza Whole 10/17/2009, 08/27/2010  . Influenza,inj,Quad PF,6+ Mos 08/08/2015  . Influenza-Unspecified 07/04/2016  . Pneumococcal Conjugate-13 10/30/2015  . Pneumococcal Polysaccharide-23 09/30/2007  . Tdap 10/30/2015  . Zoster 04/07/2014   Screening Tests Health Maintenance  Topic Date Due  . INFLUENZA VACCINE  05/14/2017  . TETANUS/TDAP  10/29/2025  . DEXA SCAN  Completed  . PNA  vac Low Risk Adult  Completed       Plan:   Follow up with PCP as directed.  Continue to eat heart healthy diet (full of fruits, vegetables, whole grains, lean protein, water--limit salt, fat, and sugar intake) and increase physical activity as tolerated.  Continue doing brain stimulating activities (puzzles, reading, adult coloring books, staying active) to keep memory sharp.    I have personally reviewed and noted the following in the patient's chart:   . Medical and social history . Use of alcohol, tobacco or illicit drugs  . Current medications and supplements . Functional ability and status . Nutritional status . Physical activity . Advanced directives . List of other physicians . Hospitalizations, surgeries, and ER visits in previous 12 months . Vitals . Screenings to include cognitive, depression, and falls . Referrals and appointments  In addition, I have reviewed and discussed with patient certain preventive protocols, quality metrics, and best practice recommendations. A written personalized care plan for preventive services as well as general preventive health recommendations were provided to patient.     Shela Nevin, [place]  09/15/2017

## 2017-09-16 LAB — CBC
HEMATOCRIT: 36.1 % (ref 36.0–46.0)
Hemoglobin: 11.8 g/dL — ABNORMAL LOW (ref 12.0–15.0)
MCHC: 32.7 g/dL (ref 30.0–36.0)
MCV: 93.7 fl (ref 78.0–100.0)
PLATELETS: 221 10*3/uL (ref 150.0–400.0)
RBC: 3.85 Mil/uL — ABNORMAL LOW (ref 3.87–5.11)
RDW: 13 % (ref 11.5–15.5)
WBC: 7 10*3/uL (ref 4.0–10.5)

## 2017-09-16 LAB — PAIN MGMT, PROFILE 8 W/CONF, U
6 ACETYLMORPHINE: NEGATIVE ng/mL (ref <10)
AMPHETAMINES: NEGATIVE ng/mL (ref <500)
Alcohol Metabolites: NEGATIVE ng/mL (ref <500)
BUPRENORPHINE, URINE: NEGATIVE ng/mL (ref <5)
Benzodiazepines: NEGATIVE ng/mL (ref <100)
COCAINE METABOLITE: NEGATIVE ng/mL (ref <150)
CREATININE: 58.1 mg/dL
MARIJUANA METABOLITE: NEGATIVE ng/mL (ref <20)
MDMA: NEGATIVE ng/mL (ref <500)
OXIDANT: NEGATIVE ug/mL (ref <200)
Opiates: NEGATIVE ng/mL (ref <100)
Oxycodone: NEGATIVE ng/mL (ref <100)
pH: 7.39 (ref 4.5–9.0)

## 2017-09-16 LAB — TSH: TSH: 3.32 u[IU]/mL (ref 0.35–4.50)

## 2017-09-16 LAB — COMPREHENSIVE METABOLIC PANEL
ALBUMIN: 4 g/dL (ref 3.5–5.2)
ALK PHOS: 59 U/L (ref 39–117)
ALT: 8 U/L (ref 0–35)
AST: 17 U/L (ref 0–37)
BUN: 16 mg/dL (ref 6–23)
CALCIUM: 9.6 mg/dL (ref 8.4–10.5)
CHLORIDE: 106 meq/L (ref 96–112)
CO2: 27 mEq/L (ref 19–32)
CREATININE: 2.22 mg/dL — AB (ref 0.40–1.20)
GFR: 22.09 mL/min — ABNORMAL LOW (ref >60.00)
Glucose, Bld: 80 mg/dL (ref 70–99)
Potassium: 5.3 mEq/L — ABNORMAL HIGH (ref 3.5–5.1)
Sodium: 139 mEq/L (ref 135–145)
Total Bilirubin: 0.4 mg/dL (ref 0.2–1.2)
Total Protein: 6.9 g/dL (ref 6.0–8.3)

## 2017-09-16 LAB — LIPID PANEL
CHOL/HDL RATIO: 3
Cholesterol: 147 mg/dL (ref 0–200)
HDL: 42.9 mg/dL (ref >39.00)
LDL CALC: 76 mg/dL (ref 0–99)
NonHDL: 104.33
TRIGLYCERIDES: 140 mg/dL (ref 0.0–149.0)
VLDL: 28 mg/dL (ref 0.0–40.0)

## 2017-09-16 LAB — VITAMIN B12: VITAMIN B 12: 344 pg/mL (ref 211–911)

## 2017-09-16 LAB — VITAMIN D 25 HYDROXY (VIT D DEFICIENCY, FRACTURES): VITD: 33.95 ng/mL (ref 30.00–100.00)

## 2017-09-29 ENCOUNTER — Ambulatory Visit: Payer: Medicare Other | Admitting: Family Medicine

## 2017-10-07 ENCOUNTER — Other Ambulatory Visit: Payer: Self-pay | Admitting: Family Medicine

## 2017-10-16 ENCOUNTER — Telehealth: Payer: Self-pay | Admitting: Family Medicine

## 2017-10-16 NOTE — Telephone Encounter (Signed)
Called pt. to check if she rec'd her voice mail re: refills on Pravastatin and Gabapentin?  She stated she contacted [facility] and was informed that they are sending out her Gabapentin.  Also, advised pt., her pravastatin had been reordered on 10/08/17.  The pt. stated she will check with her children, as they get her mail for her, and they may have the Pravastatin Rx.

## 2017-10-16 NOTE — Telephone Encounter (Signed)
Copied from [place]. Topic: Quick Communication - Rx Refill/Question >> Oct 16, 2017 10:38 AM Synthia Innocent wrote: Has the patient contacted their pharmacy? Yes.     (Agent: If no, request that the patient contact the pharmacy for the refill.)   Preferred Pharmacy (with phone number or street name): [facility]   Agent: Please be advised that RX refills may take up to 3 business days. We ask that you follow-up with your pharmacy. Requesting refill on gabapentin (NEURONTIN) 300 MG capsule and pravastatin (PRAVACHOL) 40 MG tablet. Request call once sent

## 2017-10-16 NOTE — Telephone Encounter (Signed)
Left message to call back and discuss meds. Gabapentin LR: 07/14/17 6 months worth Pravastatin LR 10/08/17 for 3 months wort.  OV for both meds : 09/15/17

## 2017-10-16 NOTE — Telephone Encounter (Signed)
Copied from [place]. Topic: Quick Communication - Rx Refill/Question >> Oct 16, 2017 10:38 AM Synthia Innocent wrote: Has the patient contacted their pharmacy? yes   (Agent: If no, request that the patient contact the pharmacy for the refill.)   Preferred Pharmacy (with phone number or street name): [facility]   Agent: Please be advised that RX refills may take up to 3 business days. We ask that you follow-up with your pharmacy. Requesting refill on gabapentin (NEURONTIN) 300 MG capsule and pravastatin (PRAVACHOL) 40 MG tablet. Request call once sent

## 2017-10-25 DIAGNOSIS — N390 Urinary tract infection, site not specified: Secondary | ICD-10-CM | POA: Diagnosis not present

## 2017-10-30 ENCOUNTER — Encounter: Payer: Medicare Other | Admitting: *Deleted

## 2017-10-30 ENCOUNTER — Telehealth: Payer: Self-pay | Admitting: Cardiology

## 2017-10-30 NOTE — Telephone Encounter (Signed)
LMOVM reminding pt to send remote transmission.   

## 2017-10-31 ENCOUNTER — Encounter: Payer: Self-pay | Admitting: Cardiology

## 2017-11-10 ENCOUNTER — Ambulatory Visit: Payer: Medicare Other | Admitting: *Deleted

## 2017-11-10 ENCOUNTER — Ambulatory Visit: Payer: Medicare Other | Admitting: Family Medicine

## 2017-11-18 ENCOUNTER — Ambulatory Visit: Payer: Medicare Other | Admitting: *Deleted

## 2017-12-08 ENCOUNTER — Ambulatory Visit: Payer: Medicare Other | Admitting: Family Medicine

## 2017-12-10 ENCOUNTER — Encounter: Payer: Medicare Other | Admitting: Cardiovascular Disease

## 2017-12-16 ENCOUNTER — Ambulatory Visit: Payer: Medicare Other | Admitting: Family Medicine

## 2018-01-05 ENCOUNTER — Other Ambulatory Visit: Payer: Self-pay | Admitting: Family Medicine

## 2018-01-12 ENCOUNTER — Ambulatory Visit (INDEPENDENT_AMBULATORY_CARE_PROVIDER_SITE_OTHER): Payer: Medicare Other | Admitting: *Deleted

## 2018-01-12 DIAGNOSIS — I495 Sick sinus syndrome: Secondary | ICD-10-CM

## 2018-01-12 NOTE — Progress Notes (Signed)
Remote pacemaker transmission.   

## 2018-01-13 ENCOUNTER — Encounter: Payer: Self-pay | Admitting: Cardiology

## 2018-01-13 DIAGNOSIS — N3090 Cystitis, unspecified without hematuria: Secondary | ICD-10-CM | POA: Diagnosis not present

## 2018-01-13 DIAGNOSIS — N3001 Acute cystitis with hematuria: Secondary | ICD-10-CM | POA: Diagnosis not present

## 2018-01-13 NOTE — Progress Notes (Signed)
Letter  

## 2018-01-20 ENCOUNTER — Encounter: Payer: Self-pay | Admitting: Family Medicine

## 2018-01-20 ENCOUNTER — Ambulatory Visit (INDEPENDENT_AMBULATORY_CARE_PROVIDER_SITE_OTHER): Payer: Medicare Other | Admitting: Family Medicine

## 2018-01-20 VITALS — BP 120/68 | HR 75 | Resp 16 | Ht 66.0 in | Wt 187.0 lb

## 2018-01-20 DIAGNOSIS — I10 Essential (primary) hypertension: Secondary | ICD-10-CM

## 2018-01-20 DIAGNOSIS — I480 Paroxysmal atrial fibrillation: Secondary | ICD-10-CM | POA: Diagnosis not present

## 2018-01-20 DIAGNOSIS — E559 Vitamin D deficiency, unspecified: Secondary | ICD-10-CM | POA: Diagnosis not present

## 2018-01-20 DIAGNOSIS — D649 Anemia, unspecified: Secondary | ICD-10-CM

## 2018-01-20 DIAGNOSIS — L989 Disorder of the skin and subcutaneous tissue, unspecified: Secondary | ICD-10-CM | POA: Diagnosis not present

## 2018-01-20 DIAGNOSIS — E039 Hypothyroidism, unspecified: Secondary | ICD-10-CM | POA: Diagnosis not present

## 2018-01-20 DIAGNOSIS — E538 Deficiency of other specified B group vitamins: Secondary | ICD-10-CM

## 2018-01-20 DIAGNOSIS — E875 Hyperkalemia: Secondary | ICD-10-CM | POA: Diagnosis not present

## 2018-01-20 MED ORDER — ALBUTEROL SULFATE HFA 108 (90 BASE) MCG/ACT IN AERS: 2.0000 | INHALATION_SPRAY | Freq: Four times a day (QID) | RESPIRATORY_TRACT | 1 refills | Status: DC | PRN

## 2018-01-20 MED ORDER — CEPHALEXIN 500 MG PO CAPS: 500.0000 mg | ORAL_CAPSULE | Freq: Four times a day (QID) | ORAL | 0 refills | Status: DC

## 2018-01-20 NOTE — Progress Notes (Signed)
Subjective:  I acted as a Education administrator for Dr. Charlett Washington. [name], [place]  Patient ID: Jill Washington, female    DOB: [birth year], 84 y.o.   MRN: [id number]  Chief Complaint  Patient presents with  . Hypothyroidism    follow up appt  . Hyperlipidemia  . Hypertension    HPI  Patient is in today for a follow up. She is following up on her HTN, hypothyroidism and is accompanied by family. Was seen at urgent care for a UTI and has been seen by urology. She has some mild urinary frequency but this is improved. No dysuria or hematuria. Denies CP/palp/SOB/HA/congestion/fevers/GI c/o. Taking meds as prescribed  Patient Care Team: Mosie Lukes, MD as PCP - General (Family Medicine) Croitoru, Dani Gobble, MD as Consulting Physician (Cardiology) Lynnell Dike, OD as Consulting Physician (Optometry)   Past Medical History:  Diagnosis Date  . Arthritis   . Blood transfusion   . CHB (complete heart block) ([place]) 11/10/2014  . Chicken pox as a child  . Colon polyps    hyperplastic  . Critical illness polyneuropathy ([place])   . Diarrhea 04/25/2017  . Diverticulosis of sigmoid colon 02/05/2007   mild  . Fecal incontinence   . Fibromyalgia   . GERD (gastroesophageal reflux disease)   . H/O measles   . H/O mumps   . Hiatal hernia   . History of atrial fibrillation   . History of chicken pox   . History of empyema of pleura   . History of gallstones   . History of skin cancer    face  . Hypercholesteremia   . Hypertension   . Hypothyroidism   . IBS (irritable bowel syndrome)   . Measles as a child  . Medicare annual wellness visit, subsequent 05/15/2015   Allergies verified: UTD   Immunization Status: Flu vaccine-- NA  Tdap-- 9-10 years ago per patient  PNA-- 09/30/07 (23)  Shingles-- 04/07/14   A/P:  Changes to [place], [place] or Personal Hx: UTD Pap-- none recent  MMG--declines further MMgs (last 08/12/03 BI-RADS 1: Neg)  Bone Density-- 08/11/03 with Prescott Gum, MD- Normal, declines further testing CCS--  05/07/12 with Silvano Rusk, MD- polyp removed- no   . Mitral valve prolapse   . Mumps as a child  . Overactive bladder 05/07/2011  . Pacemaker   . Peripheral neuropathy   . Shingles 8 yrs ago  . Sick sinus syndrome ([place])   . Urinary incontinence   . Venous insufficiency   . Vitamin D deficiency     Past Surgical History:  Procedure Laterality Date  . APPENDECTOMY  1971  . CARDIAC CATHETERIZATION  05/10/1997   Normal coronaries, MVP  . CERVICAL SPINE SURGERY    . CHOLECYSTECTOMY  03/2005   by Dr. Dalbert Batman  . COLONOSCOPY  02/05/2007,02/05/05   Dr. Silvano Rusk  . ESOPHAGOGASTRODUODENOSCOPY  02/05/2005,01/22/90   Dr. Silvano Rusk, Dr. Rachelle Hora  . NM MYOCAR PERF WALL MOTION  08/24/2010   Normal  . PACEMAKER GENERATOR CHANGE N/A 02/25/2012   Procedure: PACEMAKER GENERATOR CHANGE;  Surgeon: Sanda Klein, MD;  Location: [facility];  Service: Cardiovascular;  Laterality: N/A;  . pacemaker implanted  05/11/97,08/02/05   [place]  . TONSILLECTOMY    . TOTAL ABDOMINAL HYSTERECTOMY W/ BILATERAL SALPINGOOPHORECTOMY      Family History  Problem Relation Age of Onset  . Pancreatic cancer Mother   . Diabetes Mother 2       type 2  . Heart attack Brother   .  Diabetes Brother        type 2  . Gallbladder disease Father   . Diabetes Father        type 2  . Diabetes Brother        X 2   . Cancer Sister 80       skin ca on face  . Diabetes Sister        type 2  . Diabetes Daughter        type 2  . Hypertension Daughter   . Alcohol abuse Daughter   . Diabetes Son        type 2  . Diabetes Sister        type 1  . Hypertension Sister   . Stomach cancer Maternal Aunt   . Colon cancer Unknown     Social History   Socioeconomic History  . Marital status: Widowed    Spouse name: Not on file  . Number of children: 3  . Years of education: Not on file  . Highest education level: Not on file  Occupational History  . Occupation: retired    Fish farm manager: RETIRED  Social Needs  .  Financial resource strain: Not on file  . Food insecurity:    Worry: Not on file    Inability: Not on file  . Transportation needs:    Medical: Not on file    Non-medical: Not on file  Tobacco Use  . Smoking status: Former Smoker    Packs/day: 2.00    Years: 6.00    Pack years: 12.00    Types: Cigarettes    Last attempt to quit: 10/15/1951    Years since quitting: 66.3  . Smokeless tobacco: Never Used  Substance and Sexual Activity  . Alcohol use: No  . Drug use: No  . Sexual activity: Not on file  Lifestyle  . Physical activity:    Days per week: Not on file    Minutes per session: Not on file  . Stress: Not on file  Relationships  . Social connections:    Talks on phone: Not on file    Gets together: Not on file    Attends religious service: Not on file    Active member of club or organization: Not on file    Attends meetings of clubs or organizations: Not on file    Relationship status: Not on file  . Intimate partner violence:    Fear of current or ex partner: Not on file    Emotionally abused: Not on file    Physically abused: Not on file    Forced sexual activity: Not on file  Other Topics Concern  . Not on file  Social History Narrative   She is a widow, one son to daughters. She is retired.    Outpatient Medications Prior to Visit  Medication Sig Dispense Refill  . atenolol (TENORMIN) 50 MG tablet TAKE ONE AND ONE-HALF TABLETS DAILY 135 tablet 3  . cholecalciferol (VITAMIN D) 1000 UNITS tablet Take 1,000 Units by mouth daily.    Marland Kitchen MYRBETRIQ 50 MG TB24 tablet Take 1 tablet by mouth daily.    Marland Kitchen PACERONE 200 MG tablet TAKE ONE-HALF (1/2) TABLET DAILY 45 tablet 3  . polyvinyl alcohol (ARTIFICIAL TEARS) 1.4 % ophthalmic solution Place 1 drop into both eyes 3 (three) times daily as needed for dry eyes.    . pravastatin (PRAVACHOL) 40 MG tablet TAKE 1 TABLET DAILY 90 tablet 0  . SYNTHROID 137 MCG tablet  TAKE 1 TABLET DAILY 90 tablet 2  . traMADol (ULTRAM) 50 MG  tablet Take 1 tablet (50 mg total) by mouth daily as needed for severe pain. 90 tablet 0  . trimethoprim (TRIMPEX) 100 MG tablet Take 100 mg by mouth daily.    . valsartan-hydrochlorothiazide (DIOVAN-HCT) 80-12.5 MG tablet TAKE 1 TABLET DAILY 90 tablet 3  . warfarin (COUMADIN) 2 MG tablet TAKE 1 TABLET DAILY OR AS DIRECTED 90 tablet 1  . gabapentin (NEURONTIN) 300 MG capsule TAKE 1 CAPSULE TWICE A DAY 180 capsule 1  . omeprazole (PRILOSEC) 40 MG capsule TAKE 1 CAPSULE DAILY 90 capsule 1  . furosemide (LASIX) 20 MG tablet TAKE 1 TABLET DAILY (Patient not taking: Reported on 01/20/2018) 90 tablet 0  . nitrofurantoin, macrocrystal-monohydrate, (MACROBID) 100 MG capsule Take 1 capsule (100 mg total) by mouth 2 (two) times daily. Take for 5 days 10 capsule 0   No facility-administered medications prior to visit.     Allergies  Allergen Reactions  . Iodinated Diagnostic Agents Other (See Comments)    Pt was not told what the reaction was  . Cymbalta [Duloxetine Hcl] Other (See Comments)    Could not think straight  . Other Itching    Dial Soap    Review of Systems  Constitutional: Negative for fever and malaise/fatigue.  HENT: Negative for congestion.   Eyes: Negative for blurred vision.  Respiratory: Negative for shortness of breath.   Cardiovascular: Negative for chest pain, palpitations and leg swelling.  Gastrointestinal: Negative for abdominal pain, blood in stool and nausea.  Genitourinary: Positive for frequency. Negative for dysuria.  Musculoskeletal: Negative for falls.  Skin: Negative for rash.  Neurological: Negative for dizziness, loss of consciousness and headaches.  Endo/Heme/Allergies: Negative for environmental allergies.  Psychiatric/Behavioral: Negative for depression. The patient is not nervous/anxious.        Objective:    Physical Exam  Constitutional: She is oriented to person, place, and time. She appears well-developed and well-nourished. No distress.  HENT:    Head: Normocephalic and atraumatic.  Nose: Nose normal.  Eyes: Right eye exhibits no discharge. Left eye exhibits no discharge.  Neck: Normal range of motion. Neck supple.  Cardiovascular: Normal rate.  No murmur heard. Pulmonary/Chest: Effort normal and breath sounds normal.  Abdominal: Soft. Bowel sounds are normal. There is no tenderness.  Musculoskeletal: She exhibits no edema.  Neurological: She is alert and oriented to person, place, and time.  Skin: Skin is warm and dry.  Psychiatric: She has a normal mood and affect.  Nursing note and vitals reviewed.   BP 120/68 (BP Location: Left Arm, Patient Position: Sitting, Cuff Size: Normal)   Pulse 75   Resp 16   Ht 5\' 6"  (1.676 m)   Wt 187 lb (84.8 kg)   SpO2 98%   BMI 30.18 kg/m  Wt Readings from Last 3 Encounters:  01/20/18 187 lb (84.8 kg)  09/15/17 194 lb (88 kg)  05/20/17 189 lb 6.4 oz (85.9 kg)   BP Readings from Last 3 Encounters:  01/20/18 120/68  09/15/17 138/82  05/20/17 120/70     Immunization History  Administered Date(s) Administered  . Influenza Split 07/17/2011, 07/31/2012, 07/28/2013  . Influenza Whole 10/17/2009, 08/27/2010  . Influenza,inj,Quad PF,6+ Mos 08/08/2015  . Influenza-Unspecified 07/04/2016  . Pneumococcal Conjugate-13 10/30/2015  . Pneumococcal Polysaccharide-23 09/30/2007  . Tdap 10/30/2015  . Zoster 04/07/2014    Health Maintenance  Topic Date Due  . INFLUENZA VACCINE  05/14/2018  . TETANUS/TDAP  10/29/2025  . DEXA SCAN  Completed  . PNA vac Low Risk Adult  Completed    Lab Results  Component Value Date   WBC 8.5 01/20/2018   HGB 11.8 (L) 01/20/2018   HCT 34.6 (L) 01/20/2018   PLT 205.0 01/20/2018   GLUCOSE 83 01/20/2018   CHOL 147 09/15/2017   TRIG 140.0 09/15/2017   HDL 42.90 09/15/2017   LDLDIRECT 77.0 04/28/2015   LDLCALC 76 09/15/2017   ALT 9 01/20/2018   AST 16 01/20/2018   NA 138 01/20/2018   K 5.8 (H) 01/20/2018   CL 107 01/20/2018   CREATININE 2.42 (H)  01/20/2018   BUN 17 01/20/2018   CO2 25 01/20/2018   TSH 3.32 09/15/2017   INR 2.9 09/03/2017   HGBA1C 5.3 08/02/2015    Lab Results  Component Value Date   TSH 3.32 09/15/2017   Lab Results  Component Value Date   WBC 8.5 01/20/2018   HGB 11.8 (L) 01/20/2018   HCT 34.6 (L) 01/20/2018   MCV 91.9 01/20/2018   PLT 205.0 01/20/2018   Lab Results  Component Value Date   NA 138 01/20/2018   K 5.8 (H) 01/20/2018   CO2 25 01/20/2018   GLUCOSE 83 01/20/2018   BUN 17 01/20/2018   CREATININE 2.42 (H) 01/20/2018   BILITOT 0.3 01/20/2018   ALKPHOS 60 01/20/2018   AST 16 01/20/2018   ALT 9 01/20/2018   PROT 6.6 01/20/2018   ALBUMIN 3.7 01/20/2018   CALCIUM 9.3 01/20/2018   ANIONGAP 14 08/09/2015   GFR 19.98 (L) 01/20/2018   Lab Results  Component Value Date   CHOL 147 09/15/2017   Lab Results  Component Value Date   HDL 42.90 09/15/2017   Lab Results  Component Value Date   LDLCALC 76 09/15/2017   Lab Results  Component Value Date   TRIG 140.0 09/15/2017   Lab Results  Component Value Date   CHOLHDL 3 09/15/2017   Lab Results  Component Value Date   HGBA1C 5.3 08/02/2015         Assessment & Plan:   Problem List Items Addressed This Visit    Hypothyroidism    On Levothyroxine, continue to monitor      Vitamin D deficiency    Encouraged to take daily supplements level WNL at last check      Essential hypertension    Well controlled, no changes to meds. Encouraged heart healthy diet such as the DASH diet and exercise as tolerated.       Atrial fibrillation (HCC)    Rate controlled and tolerating Coumadin      B12 deficiency    Level wnl      Anemia    Increase leafy greens, consider increased lean red meat and using cast iron cookware. Continue to monitor, report any concerns      Relevant Orders   CBC (Completed)   Hyperkalemia    Has worsened, increase hydration, avoid offending foods, start kayexalate and recheck cmp      Relevant  Orders   Comprehensive metabolic panel (Completed)    Other Visit Diagnoses    Skin lesion of hand    -  Primary   Relevant Orders   Ambulatory referral to Dermatology      I have discontinued Pleas Koch E. Surrette's nitrofurantoin (macrocrystal-monohydrate). I am also having her start on cephALEXin, albuterol, and sodium polystyrene. Additionally, I am having her maintain her cholecalciferol, polyvinyl alcohol, trimethoprim, MYRBETRIQ, warfarin, valsartan-hydrochlorothiazide, atenolol, PACERONE, SYNTHROID,  furosemide, traMADol, and pravastatin.  Meds ordered this encounter  Medications  . cephALEXin (KEFLEX) 500 MG capsule    Sig: Take 1 capsule (500 mg total) by mouth 4 (four) times daily.    Dispense:  21 capsule    Refill:  0  . albuterol (PROVENTIL HFA;VENTOLIN HFA) 108 (90 Base) MCG/ACT inhaler    Sig: Inhale 2 puffs into the lungs every 6 (six) hours as needed for wheezing or shortness of breath.    Dispense:  1 Inhaler    Refill:  1  . sodium polystyrene (KAYEXALATE) 15 GM/60ML suspension    Sig: Take 60 mLs (15 g total) by mouth daily.    Dispense:  60 mL    Refill:  0    CMA served as scribe during this visit. History, Physical and Plan performed by medical provider. Documentation and orders reviewed and attested to.  [name], MD

## 2018-01-20 NOTE — Patient Instructions (Addendum)
Start a daily probiotic such as Culturelle or [employer] are good options While taking the new antibiotic you can hold the Nitrofurantoin Soak your hand each day in 1/2 not water and 1/2 hydrogen peroxide    Urinary Tract Infection, Adult A urinary tract infection (UTI) is an infection of any part of the urinary tract. The urinary tract includes the:  Kidneys.  Ureters.  Bladder.  Urethra.  These organs make, store, and get rid of pee (urine) in the body. Follow these instructions at home:  Take over-the-counter and prescription medicines only as told by your doctor.  If you were prescribed an antibiotic medicine, take it as told by your doctor. Do not stop taking the antibiotic even if you start to feel better.  Avoid the following drinks: ? Alcohol. ? Caffeine. ? Tea. ? Carbonated drinks.  Drink enough fluid to keep your pee clear or pale yellow.  Keep all follow-up visits as told by your doctor. This is important.  Make sure to: ? Empty your bladder often and completely. Do not to hold pee for long periods of time. ? Empty your bladder before and after sex. ? Wipe from front to back after a bowel movement if you are female. Use each tissue one time when you wipe. Contact a doctor if:  You have back pain.  You have a fever.  You feel sick to your stomach (nauseous).  You throw up (vomit).  Your symptoms do not get better after 3 days.  Your symptoms go away and then come back. Get help right away if:  You have very bad back pain.  You have very bad lower belly (abdominal) pain.  You are throwing up and cannot keep down any medicines or water. This information is not intended to replace advice given to you by your health care provider. Make sure you discuss any questions you have with your health care provider. Document Released: 03/18/2008 Document Revised: 03/07/2016 Document Reviewed: 08/21/2015 Elsevier Interactive Patient Education  [employer].

## 2018-01-21 ENCOUNTER — Other Ambulatory Visit: Payer: Self-pay | Admitting: Family Medicine

## 2018-01-21 LAB — COMPREHENSIVE METABOLIC PANEL
ALBUMIN: 3.7 g/dL (ref 3.5–5.2)
ALT: 9 U/L (ref 0–35)
AST: 16 U/L (ref 0–37)
Alkaline Phosphatase: 60 U/L (ref 39–117)
BUN: 17 mg/dL (ref 6–23)
CHLORIDE: 107 meq/L (ref 96–112)
CO2: 25 meq/L (ref 19–32)
Calcium: 9.3 mg/dL (ref 8.4–10.5)
Creatinine, Ser: 2.42 mg/dL — ABNORMAL HIGH (ref 0.40–1.20)
GFR: 19.98 mL/min — ABNORMAL LOW (ref >60.00)
GLUCOSE: 83 mg/dL (ref 70–99)
Potassium: 5.8 mEq/L — ABNORMAL HIGH (ref 3.5–5.1)
SODIUM: 138 meq/L (ref 135–145)
Total Bilirubin: 0.3 mg/dL (ref 0.2–1.2)
Total Protein: 6.6 g/dL (ref 6.0–8.3)

## 2018-01-21 LAB — CBC
HEMATOCRIT: 34.6 % — AB (ref 36.0–46.0)
HEMOGLOBIN: 11.8 g/dL — AB (ref 12.0–15.0)
MCHC: 34 g/dL (ref 30.0–36.0)
MCV: 91.9 fl (ref 78.0–100.0)
Platelets: 205 10*3/uL (ref 150.0–400.0)
RBC: 3.77 Mil/uL — ABNORMAL LOW (ref 3.87–5.11)
RDW: 13.2 % (ref 11.5–15.5)
WBC: 8.5 10*3/uL (ref 4.0–10.5)

## 2018-01-25 DIAGNOSIS — E875 Hyperkalemia: Secondary | ICD-10-CM | POA: Insufficient documentation

## 2018-01-25 MED ORDER — SODIUM POLYSTYRENE SULFONATE 15 GM/60ML PO SUSP: 15.0000 g | Freq: Every day | ORAL | 0 refills | Status: DC

## 2018-01-25 NOTE — Assessment & Plan Note (Signed)
Rate controlled and tolerating Coumadin 

## 2018-01-25 NOTE — Assessment & Plan Note (Signed)
Encouraged to take daily supplements level WNL at last check

## 2018-01-25 NOTE — Assessment & Plan Note (Signed)
Has worsened, increase hydration, avoid offending foods, start kayexalate and recheck cmp

## 2018-01-25 NOTE — Assessment & Plan Note (Signed)
Well controlled, no changes to meds. Encouraged heart healthy diet such as the DASH diet and exercise as tolerated.  °

## 2018-01-25 NOTE — Assessment & Plan Note (Signed)
Level wnl

## 2018-01-25 NOTE — Assessment & Plan Note (Signed)
On Levothyroxine, continue to monitor 

## 2018-01-25 NOTE — Assessment & Plan Note (Signed)
Increase leafy greens, consider increased lean red meat and using cast iron cookware. Continue to monitor, report any concerns 

## 2018-01-27 LAB — CUP PACEART REMOTE DEVICE CHECK
Battery Remaining Percentage: 51 %
Brady Statistic RV Percent Paced: 99 %
Implantable Lead Implant Date: 19980729
Implantable Lead Location: 753859
Implantable Lead Location: 753860
Lead Channel Impedance Value: 410 Ohm
Lead Channel Impedance Value: 490 Ohm
Lead Channel Pacing Threshold Amplitude: 0.375 V
Lead Channel Pacing Threshold Amplitude: 1.375 V
Lead Channel Pacing Threshold Pulse Width: 0.4 ms
Lead Channel Pacing Threshold Pulse Width: 0.4 ms
Lead Channel Setting Sensing Sensitivity: 4 mV
MDC IDC LEAD IMPLANT DT: 19980729
MDC IDC MSMT BATTERY REMAINING LONGEVITY: 56 mo
MDC IDC MSMT BATTERY VOLTAGE: 2.87 V
MDC IDC PG IMPLANT DT: 20130514
MDC IDC SESS DTM: 20190416123634
MDC IDC SET LEADCHNL RA PACING AMPLITUDE: 1.5 V
MDC IDC SET LEADCHNL RV PACING AMPLITUDE: 1.25 V
MDC IDC SET LEADCHNL RV PACING PULSEWIDTH: 0.4 ms
MDC IDC STAT BRADY RA PERCENT PACED: 98 %
Pulse Gen Serial Number: 7338831

## 2018-01-28 ENCOUNTER — Telehealth: Payer: Self-pay | Admitting: *Deleted

## 2018-01-28 DIAGNOSIS — L989 Disorder of the skin and subcutaneous tissue, unspecified: Secondary | ICD-10-CM

## 2018-01-28 NOTE — Telephone Encounter (Signed)
Received call from [facility] stating they have a rheumatologist but not a dermatologist on staff and we will need to refer pt to a different group for dermatology referral.  Please advise?

## 2018-01-28 NOTE — Telephone Encounter (Signed)
I made a dermatology referral not sure how it landed with [facility] can we set up derm referral please? Thanks

## 2018-01-29 ENCOUNTER — Telehealth: Payer: Self-pay | Admitting: *Deleted

## 2018-01-29 NOTE — Telephone Encounter (Signed)
Referral placed.

## 2018-01-29 NOTE — Telephone Encounter (Signed)
Received Medical Review Program of the [facility]; faxed to [facility] Medical Review Branch/SLS 04/18

## 2018-02-03 ENCOUNTER — Ambulatory Visit (INDEPENDENT_AMBULATORY_CARE_PROVIDER_SITE_OTHER): Payer: Medicare Other | Admitting: Pharmacist

## 2018-02-03 DIAGNOSIS — Z7901 Long term (current) use of anticoagulants: Secondary | ICD-10-CM

## 2018-02-03 DIAGNOSIS — I480 Paroxysmal atrial fibrillation: Secondary | ICD-10-CM

## 2018-02-03 LAB — POCT INR: INR: 2

## 2018-02-10 DIAGNOSIS — C44629 Squamous cell carcinoma of skin of left upper limb, including shoulder: Secondary | ICD-10-CM | POA: Diagnosis not present

## 2018-02-10 DIAGNOSIS — L570 Actinic keratosis: Secondary | ICD-10-CM | POA: Diagnosis not present

## 2018-02-10 DIAGNOSIS — D485 Neoplasm of uncertain behavior of skin: Secondary | ICD-10-CM | POA: Diagnosis not present

## 2018-02-11 DIAGNOSIS — D485 Neoplasm of uncertain behavior of skin: Secondary | ICD-10-CM | POA: Diagnosis not present

## 2018-02-11 DIAGNOSIS — C44629 Squamous cell carcinoma of skin of left upper limb, including shoulder: Secondary | ICD-10-CM | POA: Diagnosis not present

## 2018-02-19 DIAGNOSIS — J0100 Acute maxillary sinusitis, unspecified: Secondary | ICD-10-CM | POA: Diagnosis not present

## 2018-02-19 DIAGNOSIS — J209 Acute bronchitis, unspecified: Secondary | ICD-10-CM | POA: Diagnosis not present

## 2018-03-02 ENCOUNTER — Encounter: Payer: Medicare Other | Admitting: Cardiovascular Disease

## 2018-03-06 DIAGNOSIS — L97222 Non-pressure chronic ulcer of left calf with fat layer exposed: Secondary | ICD-10-CM | POA: Diagnosis not present

## 2018-03-06 DIAGNOSIS — S81802A Unspecified open wound, left lower leg, initial encounter: Secondary | ICD-10-CM | POA: Diagnosis not present

## 2018-03-11 DIAGNOSIS — L97222 Non-pressure chronic ulcer of left calf with fat layer exposed: Secondary | ICD-10-CM | POA: Diagnosis not present

## 2018-03-11 DIAGNOSIS — S81802D Unspecified open wound, left lower leg, subsequent encounter: Secondary | ICD-10-CM | POA: Diagnosis not present

## 2018-03-18 DIAGNOSIS — S81802D Unspecified open wound, left lower leg, subsequent encounter: Secondary | ICD-10-CM | POA: Diagnosis not present

## 2018-03-18 DIAGNOSIS — L97222 Non-pressure chronic ulcer of left calf with fat layer exposed: Secondary | ICD-10-CM | POA: Diagnosis not present

## 2018-03-23 ENCOUNTER — Telehealth: Payer: Self-pay | Admitting: Family Medicine

## 2018-03-23 NOTE — Telephone Encounter (Signed)
Copied from [place] [id number]. Topic: Quick Communication - See Telephone Encounter >> Mar 23, 2018 11:33 AM Rutherford Nail, NT wrote: CRM for notification. See Telephone encounter for: 03/23/18. Patient calling and is requesting a 2 week supply of atenolol (TENORMIN) 50 MG tablet  be sent to [place], [place] - [address]. States that [facility] is sending her medications, but she is completely out and it will be 2 weeks before her medications get to her. Is requesting a phone call to discuss sending this. Please advise. CB#: [phone]

## 2018-03-23 NOTE — Telephone Encounter (Signed)
Yes please send the tenormin 2 week supply to her local pharmacy

## 2018-03-24 ENCOUNTER — Other Ambulatory Visit: Payer: Self-pay | Admitting: *Deleted

## 2018-03-24 MED ORDER — ATENOLOL 50 MG PO TABS: 75.0000 mg | ORAL_TABLET | Freq: Every day | ORAL | 0 refills | Status: DC

## 2018-03-24 NOTE — Telephone Encounter (Signed)
2 week supply of tenormin sent to local pharmacy.  Rx refill request by outside provider: Diovan- HCT 80-12.5 mg     Last filled: 11/26/16 3 RF  LOV: 01/20/18  PCP: [place]: [facility]

## 2018-03-24 NOTE — Telephone Encounter (Signed)
Copied from [place] [phone]. Topic: Quick Communication - Rx Refill/Question >> Mar 24, 2018  8:10 AM Carolyn Stare wrote: Medication   valsartan-hydrochlorothiazide (DIOVAN-HCT) 80-12.5 MG tablet  Has the patient contacted their pharmacy yes     Preferred Pharmacy  [facility] Agent: Please be advised that RX refills may take up to 3 business days. We ask that you follow-up with your pharmacy.

## 2018-03-25 DIAGNOSIS — S81802A Unspecified open wound, left lower leg, initial encounter: Secondary | ICD-10-CM | POA: Diagnosis not present

## 2018-03-25 DIAGNOSIS — S81802D Unspecified open wound, left lower leg, subsequent encounter: Secondary | ICD-10-CM | POA: Diagnosis not present

## 2018-03-26 MED ORDER — VALSARTAN-HYDROCHLOROTHIAZIDE 80-12.5 MG PO TABS: 1.0000 | ORAL_TABLET | Freq: Every day | ORAL | 3 refills | Status: DC

## 2018-03-26 NOTE — Addendum Note (Signed)
Addended by: Magdalene Molly A on: 03/26/2018 08:51 AM   Modules accepted: Orders

## 2018-03-26 NOTE — Telephone Encounter (Signed)
Medication sent.

## 2018-03-31 ENCOUNTER — Other Ambulatory Visit: Payer: Self-pay | Admitting: Family Medicine

## 2018-04-02 DIAGNOSIS — L97222 Non-pressure chronic ulcer of left calf with fat layer exposed: Secondary | ICD-10-CM | POA: Diagnosis not present

## 2018-04-02 DIAGNOSIS — S81802D Unspecified open wound, left lower leg, subsequent encounter: Secondary | ICD-10-CM | POA: Diagnosis not present

## 2018-04-05 ENCOUNTER — Other Ambulatory Visit: Payer: Self-pay | Admitting: Family Medicine

## 2018-04-06 ENCOUNTER — Encounter: Payer: Self-pay | Admitting: *Deleted

## 2018-04-06 ENCOUNTER — Telehealth: Payer: Self-pay | Admitting: *Deleted

## 2018-04-06 NOTE — Telephone Encounter (Signed)
Received the Cardiology & Endocrinology page of the Medical Review Program of the [facility]; patient did not send the page that is intended for the PCP to complete [facility] page after patient information page. LMOM with contact name and number for return call RE: needing this page to complete and she will need to send a copy of the Cardiology to her cardiologist to complete beyond HTN and HLD, managed by PCP. We have a note on 01/29/18: Received Medical Review Program of the [facility]; faxed to [facility] Medical Review Branch/SLS 04/18 Not sure why we would need to be completing again so soon/SLS 06/24

## 2018-04-07 ENCOUNTER — Telehealth: Payer: Self-pay | Admitting: Family Medicine

## 2018-04-07 NOTE — Telephone Encounter (Signed)
Copied from [place] [phone]. Topic: Quick Communication - Rx Refill/Question >> Apr 07, 2018  5:45 PM Sallee Provencal, Maryclare Nydam B, NT wrote: Medication: atenolol (TENORMIN) 50 MG tablet (90 day supply)  Has the patient contacted their pharmacy? Yes.   (Agent: If no, request that the patient contact the pharmacy for the refill.) (Agent: If yes, when and what did the pharmacy advise?)  Preferred Pharmacy (with phone number or street name): [facility]  Agent: Please be advised that RX refills may take up to 3 business days. We ask that you follow-up with your pharmacy.   **Patient would like to know if enough of the medication could also be sent to [place], [place] - [address] to last her until the 90 day supply [facility]**

## 2018-04-08 DIAGNOSIS — L97222 Non-pressure chronic ulcer of left calf with fat layer exposed: Secondary | ICD-10-CM | POA: Diagnosis not present

## 2018-04-08 DIAGNOSIS — S81802D Unspecified open wound, left lower leg, subsequent encounter: Secondary | ICD-10-CM | POA: Diagnosis not present

## 2018-04-09 ENCOUNTER — Other Ambulatory Visit: Payer: Self-pay

## 2018-04-09 MED ORDER — ATENOLOL 50 MG PO TABS: 75.0000 mg | ORAL_TABLET | Freq: Every day | ORAL | 0 refills | Status: DC

## 2018-04-13 ENCOUNTER — Encounter: Payer: Medicare Other | Admitting: *Deleted

## 2018-04-13 ENCOUNTER — Telehealth: Payer: Self-pay | Admitting: Cardiology

## 2018-04-13 NOTE — Telephone Encounter (Signed)
LMOVM reminding pt to send remote transmission.   

## 2018-04-15 ENCOUNTER — Encounter: Payer: Self-pay | Admitting: Cardiology

## 2018-04-22 DIAGNOSIS — L97222 Non-pressure chronic ulcer of left calf with fat layer exposed: Secondary | ICD-10-CM | POA: Diagnosis not present

## 2018-04-27 ENCOUNTER — Other Ambulatory Visit: Payer: Self-pay | Admitting: Cardiovascular Disease

## 2018-04-27 NOTE — Telephone Encounter (Signed)
°*  STAT* If patient is at the pharmacy, call can be transferred to refill team.   1. Which medications need to be refilled? (please list name of each medication and dose if known) Atenolol need this asap pleasehsi   2. Which pharmacy/location (including street and city if local pharmacy) is medication to be sent to?[facility]  3. Do they need a 30 day or 90 day supply? 180 and refills

## 2018-04-27 NOTE — Telephone Encounter (Signed)
Rx sent to pharmacy   

## 2018-04-29 DIAGNOSIS — R600 Localized edema: Secondary | ICD-10-CM | POA: Diagnosis not present

## 2018-04-29 DIAGNOSIS — L97222 Non-pressure chronic ulcer of left calf with fat layer exposed: Secondary | ICD-10-CM | POA: Diagnosis not present

## 2018-05-06 ENCOUNTER — Encounter: Payer: Medicare Other | Admitting: Cardiovascular Disease

## 2018-05-06 NOTE — Progress Notes (Signed)
Patient ID: Jill Washington, female   DOB: 1925/12/10, 84 y.o.   MRN: [id number]    Cardiology Office Note    Date:  05/07/2018   ID:  Jill Washington, DOB 1926-01-22, MRN [id number]  PCP:  Mosie Lukes, MD  Cardiologist:   Sanda Klein, MD   No chief complaint on file.   History of Present Illness:  Jill Washington is a 84 y.o. female with Complete heart block, sinus node dysfunction, Paroxysmal atrial fibrillation.  She generally feels well.  She has chronic problems with lower extremity edema that are generally well controlled if she wears compression stockings.  She does have some open ulcerations on her left lower extremity and is going to the wound center.  Recently she has been dealing with a nonproductive cough and saw a pulmonary specialist who prescribed Claritin-D.  She tells me she was also prescribed a device that sounds like a nebulizer, but could not afford it.  The patient specifically denies any chest pain at rest exertion, dyspnea at rest or with exertion, orthopnea, paroxysmal nocturnal dyspnea, syncope, palpitations, focal neurological deficits, intermittent claudication, lower extremity edema, unexplained weight gain,  hemoptysis or wheezing.  She ran out of her beta-blocker about 3 weeks ago and believes that is why blood pressure is a little high.  She just got the medicine refilled today but has not yet taken it.  Pacemaker interrogation shows normal device function. She has 96 % atrial pacing and is pacemaker dependent with >99% ventricular pacing due to complete heart block.   Her device was implanted in 2013 and she has roughly 4.5 years of estimated generator longevity Psychologist, sport and exercise. Psychiatrist).  She had an episode of persistent atrial fibrillation lasting for about 3 weeks in May of this year.  Her overall burden of atrial fibrillation is only around 2%.  She remembers feeling a little unwell around the time of her episode of atrial fibrillation.  She was  actually down in [place] visiting.  The device reported brief loss of capture during episode of atrial fibrillation, but evaluation of the electrograms actually shows normal capture. There are no high ventricular rates.   She is on chronic amiodarone and warfarin therapy.  We checked her INR today.  She had liver function tests in the normal range a couple of months ago, but has not had a thyroid function test checked since last December.  Past Medical History:  Diagnosis Date  . Arthritis   . Blood transfusion   . CHB (complete heart block) ([place]) 11/10/2014  . Chicken pox as a child  . Colon polyps    hyperplastic  . Critical illness polyneuropathy ([place])   . Diarrhea 04/25/2017  . Diverticulosis of sigmoid colon 02/05/2007   mild  . Fecal incontinence   . Fibromyalgia   . GERD (gastroesophageal reflux disease)   . H/O measles   . H/O mumps   . Hiatal hernia   . History of atrial fibrillation   . History of chicken pox   . History of empyema of pleura   . History of gallstones   . History of skin cancer    face  . Hypercholesteremia   . Hypertension   . Hypothyroidism   . IBS (irritable bowel syndrome)   . Measles as a child  . Medicare annual wellness visit, subsequent 05/15/2015   Allergies verified: UTD   Immunization Status: Flu vaccine-- NA  Tdap-- 9-10 years ago per patient  PNA-- 09/30/07 (23)  Shingles--  04/07/14   A/P:  Changes to [place], PSH or Personal Hx: UTD Pap-- none recent  MMG--declines further MMgs (last 08/12/03 BI-RADS 1: Neg)  Bone Density-- 08/11/03 with Prescott Gum, MD- Normal, declines further testing CCS-- 05/07/12 with Silvano Rusk, MD- polyp removed- no   . Mitral valve prolapse   . Mumps as a child  . Overactive bladder 05/07/2011  . Pacemaker   . Peripheral neuropathy   . Shingles 8 yrs ago  . Sick sinus syndrome ([place])   . Urinary incontinence   . Venous insufficiency   . Vitamin D deficiency     Past Surgical History:  Procedure Laterality Date  .  APPENDECTOMY  1971  . CARDIAC CATHETERIZATION  05/10/1997   Normal coronaries, MVP  . CERVICAL SPINE SURGERY    . CHOLECYSTECTOMY  03/2005   by Dr. Dalbert Batman  . COLONOSCOPY  02/05/2007,02/05/05   Dr. Silvano Rusk  . ESOPHAGOGASTRODUODENOSCOPY  02/05/2005,01/22/90   Dr. Silvano Rusk, Dr. Rachelle Hora  . NM MYOCAR PERF WALL MOTION  08/24/2010   Normal  . PACEMAKER GENERATOR CHANGE N/A 02/25/2012   Procedure: PACEMAKER GENERATOR CHANGE;  Surgeon: Sanda Klein, MD;  Location: [facility];  Service: Cardiovascular;  Laterality: N/A;  . pacemaker implanted  05/11/97,08/02/05   [place]  . TONSILLECTOMY    . TOTAL ABDOMINAL HYSTERECTOMY W/ BILATERAL SALPINGOOPHORECTOMY      Current Medications: Outpatient Medications Prior to Visit  Medication Sig Dispense Refill  . albuterol (PROVENTIL HFA;VENTOLIN HFA) 108 (90 Base) MCG/ACT inhaler Inhale 2 puffs into the lungs every 6 (six) hours as needed for wheezing or shortness of breath. 1 Inhaler 1  . cephALEXin (KEFLEX) 500 MG capsule Take 1 capsule (500 mg total) by mouth 4 (four) times daily. 21 capsule 0  . cholecalciferol (VITAMIN D) 1000 UNITS tablet Take 1,000 Units by mouth daily.    . furosemide (LASIX) 20 MG tablet TAKE 1 TABLET DAILY 90 tablet 0  . gabapentin (NEURONTIN) 300 MG capsule TAKE 1 CAPSULE TWICE A DAY 180 capsule 1  . LEVOXYL 137 MCG tablet TAKE 1 TABLET DAILY 90 tablet 2  . MYRBETRIQ 50 MG TB24 tablet Take 1 tablet by mouth daily.    Marland Kitchen omeprazole (PRILOSEC) 40 MG capsule TAKE 1 CAPSULE DAILY 90 capsule 1  . PACERONE 200 MG tablet TAKE ONE-HALF (1/2) TABLET DAILY 45 tablet 3  . polyvinyl alcohol (ARTIFICIAL TEARS) 1.4 % ophthalmic solution Place 1 drop into both eyes 3 (three) times daily as needed for dry eyes.    . pravastatin (PRAVACHOL) 40 MG tablet TAKE 1 TABLET DAILY 90 tablet 0  . sodium polystyrene (KAYEXALATE) 15 GM/60ML suspension Take 60 mLs (15 g total) by mouth daily. 60 mL 0  . traMADol (ULTRAM) 50 MG tablet Take 1  tablet (50 mg total) by mouth daily as needed for severe pain. 90 tablet 0  . trimethoprim (TRIMPEX) 100 MG tablet Take 100 mg by mouth daily.    . valsartan-hydrochlorothiazide (DIOVAN-HCT) 80-12.5 MG tablet Take 1 tablet by mouth daily. 90 tablet 3  . warfarin (COUMADIN) 2 MG tablet TAKE 1 TABLET DAILY OR AS DIRECTED 90 tablet 1  . atenolol (TENORMIN) 50 MG tablet TAKE ONE AND ONE-HALF TABLETS DAILY 135 tablet 3   No facility-administered medications prior to visit.      Allergies:   Iodinated diagnostic agents; Cymbalta [duloxetine hcl]; and Other   Social History   Socioeconomic History  . Marital status: Widowed    Spouse name: Not on file  .  Number of children: 3  . Years of education: Not on file  . Highest education level: Not on file  Occupational History  . Occupation: retired    Fish farm manager: RETIRED  Social Needs  . Financial resource strain: Not on file  . Food insecurity:    Worry: Not on file    Inability: Not on file  . Transportation needs:    Medical: Not on file    Non-medical: Not on file  Tobacco Use  . Smoking status: Former Smoker    Packs/day: 2.00    Years: 6.00    Pack years: 12.00    Types: Cigarettes    Last attempt to quit: 10/15/1951    Years since quitting: 66.6  . Smokeless tobacco: Never Used  Substance and Sexual Activity  . Alcohol use: No  . Drug use: No  . Sexual activity: Not on file  Lifestyle  . Physical activity:    Days per week: Not on file    Minutes per session: Not on file  . Stress: Not on file  Relationships  . Social connections:    Talks on phone: Not on file    Gets together: Not on file    Attends religious service: Not on file    Active member of club or organization: Not on file    Attends meetings of clubs or organizations: Not on file    Relationship status: Not on file  Other Topics Concern  . Not on file  Social History Narrative   She is a widow, one son to daughters. She is retired.     Family History:   The patient's family history includes Alcohol abuse in her daughter; Cancer (age of onset: 21) in her sister; Colon cancer in her unknown relative; Diabetes in her brother, brother, daughter, father, sister, sister, and son; Diabetes (age of onset: 62) in her mother; Gallbladder disease in her father; Heart attack in her brother; Hypertension in her daughter and sister; Pancreatic cancer in her mother; Stomach cancer in her maternal aunt.   ROS:   Please see the history of present illness.    ROS All other systems reviewed and are negative.   PHYSICAL EXAM:   VS:  BP (!) 142/86   Pulse 96   Ht 5\' 6"  (1.676 m)   Wt 183 lb (83 kg)   BMI 29.54 kg/m     General: Alert, oriented x3, no distress, appear well, healthy PPM site Head: no evidence of trauma, PERRL, EOMI, no exophtalmos or lid lag, no myxedema, no xanthelasma; normal ears, nose and oropharynx Neck: normal jugular venous pulsations and no hepatojugular reflux; brisk carotid pulses without delay and no carotid bruits Chest: clear to auscultation, no signs of consolidation by percussion or palpation, normal fremitus, symmetrical and full respiratory excursions Cardiovascular: normal position and quality of the apical impulse, regular rhythm, normal first and second heart sounds, no murmurs, rubs or gallops Abdomen: no tenderness or distention, no masses by palpation, no abnormal pulsatility or arterial bruits, normal bowel sounds, no hepatosplenomegaly Extremities: no clubbing, cyanosis or edema; 2+ radial, ulnar and brachial pulses bilaterally; 2+ right femoral, posterior tibial and dorsalis pedis pulses; 2+ left femoral, posterior tibial and dorsalis pedis pulses; no subclavian or femoral bruits Neurological: grossly nonfocal Psych: Normal mood and affect   Wt Readings from Last 3 Encounters:  05/07/18 183 lb (83 kg)  01/20/18 187 lb (84.8 kg)  09/15/17 194 lb (88 kg)      Studies/Labs Reviewed:  EKG:  EKG is not ordered  today.  The ekg ordered at her last appointment demonstrates AV sequential paced rhythm  Recent Labs: 09/15/2017: TSH 3.32 01/20/2018: ALT 9; BUN 17; Creatinine, Ser 2.42; Hemoglobin 11.8; Platelets 205.0; Potassium 5.8; Sodium 138   Lipid Panel    Component Value Date/Time   CHOL 147 09/15/2017 1555   TRIG 140.0 09/15/2017 1555   HDL 42.90 09/15/2017 1555   CHOLHDL 3 09/15/2017 1555   VLDL 28.0 09/15/2017 1555   LDLCALC 76 09/15/2017 1555   LDLDIRECT 77.0 04/28/2015 1202    ASSESSMENT:    1. Paroxysmal atrial fibrillation (HCC)   2. SSS (sick sinus syndrome) ([place])   3. CHB (complete heart block) (H[facility])   4. Long term current use of anticoagulant therapy   5. Mixed hyperlipidemia   6. Pacemaker -  [place] leads implanted in 1998 generator change 2006 and 2013   7. Encounter for monitoring amiodarone therapy      PLAN:  In order of problems listed above:   1. Afib: Overall low burden of atrial fibrillation, although she had a lengthy episode of persistent atrial fibrillation in May.  I do not think we need to increase her dose of amiodarone.  Continue warfarin anticoagulation. CHADSVasc at least 3. 2. SSS: 100% atrial pacing likely due to amiodarone. 3. CHB: pacemaker dependent (precedes amiodarone use). 4. Warfarin: Well-tolerated, no bleeding. INR subtherapeutic and dose adjusted today. 5. HLP: satisfactory lipid profile on recent assay; mild residual hypertriglyceridemia does not require pharmacological therapy; history of normal coronary arteries by angiography in 1998, normal nuclear stress test 2014 6. PPM: normal device function, remote [place] transmission every 3 months and office visit in 12 months. 7. Amiodarone: check LFTs and TSH Q 54mos. 8. HTN: Reviewed the need to avoid abrupt beta-blocker discontinuation due to the risks of rebound arrhythmia hypertension.    Medication Adjustments/Labs and Tests Ordered: Current medicines are reviewed at length with the  patient today.  Concerns regarding medicines are outlined above.  Medication changes, Labs and Tests ordered today are listed in the Patient Instructions below. Patient Instructions  Medication Instructions:  Your physician recommends that you continue on your current medications as directed. Please refer to the Current Medication list given to you today.   Labwork: Your physician recommends that you return for lab work in: TSH   Testing/Procedures: none  Follow-Up: Remote monitoring is used to monitor your Pacemaker or ICD from home. This monitoring reduces the number of office visits required to check your device to one time per year. It allows [place] to keep an eye on the functioning of your device to ensure it is working properly. You are scheduled for a device check from home on 3 months. You may send your transmission at any time that day. If you have a wireless device, the transmission will be sent automatically. After your physician reviews your transmission, you will receive a postcard with your next transmission date.  Your physician wants you to follow-up in: 12 months with Dr. Sallyanne Kuster. You will receive a reminder letter in the mail two months in advance. If you don't receive a letter, please call our office to schedule the follow-up appointment.    Any Other Special Instructions Will Be Listed Below (If Applicable).     If you need a refill on your cardiac medications before your next appointment, please call your pharmacy.      Signed, Sanda Klein, MD  05/07/2018 2:47 PM    [facility] [address], [place], [place]  [address] Phone: [phone]; Fax: [phone]

## 2018-05-07 ENCOUNTER — Ambulatory Visit (INDEPENDENT_AMBULATORY_CARE_PROVIDER_SITE_OTHER): Payer: Medicare Other | Admitting: Cardiovascular Disease

## 2018-05-07 ENCOUNTER — Encounter: Payer: Self-pay | Admitting: Cardiovascular Disease

## 2018-05-07 ENCOUNTER — Ambulatory Visit (INDEPENDENT_AMBULATORY_CARE_PROVIDER_SITE_OTHER): Payer: Medicare Other | Admitting: Pharmacist

## 2018-05-07 VITALS — BP 142/86 | HR 96 | Ht 66.0 in | Wt 183.0 lb

## 2018-05-07 DIAGNOSIS — Z5181 Encounter for therapeutic drug level monitoring: Secondary | ICD-10-CM | POA: Diagnosis not present

## 2018-05-07 DIAGNOSIS — Z950 Presence of cardiac pacemaker: Secondary | ICD-10-CM

## 2018-05-07 DIAGNOSIS — I495 Sick sinus syndrome: Secondary | ICD-10-CM | POA: Diagnosis not present

## 2018-05-07 DIAGNOSIS — Z7901 Long term (current) use of anticoagulants: Secondary | ICD-10-CM

## 2018-05-07 DIAGNOSIS — I442 Atrioventricular block, complete: Secondary | ICD-10-CM

## 2018-05-07 DIAGNOSIS — Z79899 Other long term (current) drug therapy: Secondary | ICD-10-CM

## 2018-05-07 DIAGNOSIS — I480 Paroxysmal atrial fibrillation: Secondary | ICD-10-CM

## 2018-05-07 DIAGNOSIS — E782 Mixed hyperlipidemia: Secondary | ICD-10-CM | POA: Diagnosis not present

## 2018-05-07 DIAGNOSIS — I10 Essential (primary) hypertension: Secondary | ICD-10-CM | POA: Diagnosis not present

## 2018-05-07 LAB — POCT INR: INR: 1.4 — AB (ref 2.0–3.0)

## 2018-05-07 LAB — TSH: TSH: 2.67 u[IU]/mL (ref 0.450–4.500)

## 2018-05-07 MED ORDER — ATENOLOL 50 MG PO TABS: 75.0000 mg | ORAL_TABLET | Freq: Every day | ORAL | 3 refills | Status: DC

## 2018-05-07 NOTE — Patient Instructions (Signed)
Medication Instructions:  Your physician recommends that you continue on your current medications as directed. Please refer to the Current Medication list given to you today.   Labwork: Your physician recommends that you return for lab work in: TSH   Testing/Procedures: none  Follow-Up: Remote monitoring is used to monitor your Pacemaker or ICD from home. This monitoring reduces the number of office visits required to check your device to one time per year. It allows [place] to keep an eye on the functioning of your device to ensure it is working properly. You are scheduled for a device check from home on 3 months. You may send your transmission at any time that day. If you have a wireless device, the transmission will be sent automatically. After your physician reviews your transmission, you will receive a postcard with your next transmission date.  Your physician wants you to follow-up in: 12 months with Dr. Sallyanne Kuster. You will receive a reminder letter in the mail two months in advance. If you don't receive a letter, please call our office to schedule the follow-up appointment.    Any Other Special Instructions Will Be Listed Below (If Applicable).     If you need a refill on your cardiac medications before your next appointment, please call your pharmacy.

## 2018-05-08 NOTE — Addendum Note (Signed)
Addended by: Therisa Doyne on: 05/08/2018 04:50 PM   Modules accepted: Orders

## 2018-05-18 ENCOUNTER — Ambulatory Visit (INDEPENDENT_AMBULATORY_CARE_PROVIDER_SITE_OTHER): Payer: Medicare Other | Admitting: Pharmacist

## 2018-05-18 DIAGNOSIS — Z7901 Long term (current) use of anticoagulants: Secondary | ICD-10-CM | POA: Diagnosis not present

## 2018-05-18 DIAGNOSIS — I480 Paroxysmal atrial fibrillation: Secondary | ICD-10-CM

## 2018-05-18 LAB — POCT INR: INR: 2.4 (ref 2.0–3.0)

## 2018-05-20 DIAGNOSIS — L97221 Non-pressure chronic ulcer of left calf limited to breakdown of skin: Secondary | ICD-10-CM | POA: Diagnosis not present

## 2018-05-20 DIAGNOSIS — I872 Venous insufficiency (chronic) (peripheral): Secondary | ICD-10-CM | POA: Diagnosis not present

## 2018-05-20 DIAGNOSIS — L97222 Non-pressure chronic ulcer of left calf with fat layer exposed: Secondary | ICD-10-CM | POA: Diagnosis not present

## 2018-05-20 DIAGNOSIS — I890 Lymphedema, not elsewhere classified: Secondary | ICD-10-CM | POA: Diagnosis not present

## 2018-05-21 DIAGNOSIS — C44629 Squamous cell carcinoma of skin of left upper limb, including shoulder: Secondary | ICD-10-CM | POA: Diagnosis not present

## 2018-05-25 ENCOUNTER — Ambulatory Visit (INDEPENDENT_AMBULATORY_CARE_PROVIDER_SITE_OTHER): Payer: Medicare Other | Admitting: Family Medicine

## 2018-05-25 ENCOUNTER — Encounter: Payer: Self-pay | Admitting: Family Medicine

## 2018-05-25 VITALS — BP 138/78 | HR 77 | Temp 98.1°F | Resp 18 | Wt 182.4 lb

## 2018-05-25 DIAGNOSIS — E559 Vitamin D deficiency, unspecified: Secondary | ICD-10-CM

## 2018-05-25 DIAGNOSIS — E538 Deficiency of other specified B group vitamins: Secondary | ICD-10-CM | POA: Diagnosis not present

## 2018-05-25 DIAGNOSIS — I480 Paroxysmal atrial fibrillation: Secondary | ICD-10-CM | POA: Diagnosis not present

## 2018-05-25 DIAGNOSIS — R350 Frequency of micturition: Secondary | ICD-10-CM

## 2018-05-25 DIAGNOSIS — E875 Hyperkalemia: Secondary | ICD-10-CM

## 2018-05-25 DIAGNOSIS — Z85828 Personal history of other malignant neoplasm of skin: Secondary | ICD-10-CM | POA: Diagnosis not present

## 2018-05-25 DIAGNOSIS — N289 Disorder of kidney and ureter, unspecified: Secondary | ICD-10-CM

## 2018-05-25 DIAGNOSIS — D509 Iron deficiency anemia, unspecified: Secondary | ICD-10-CM | POA: Diagnosis not present

## 2018-05-25 DIAGNOSIS — I10 Essential (primary) hypertension: Secondary | ICD-10-CM

## 2018-05-25 DIAGNOSIS — E039 Hypothyroidism, unspecified: Secondary | ICD-10-CM

## 2018-05-25 NOTE — Progress Notes (Signed)
Subjective:  I acted as a Education administrator for Dr. Charlett Blake. [name], [place]  Patient ID: Jill Washington, female    DOB: [birth year], 84 y.o.   MRN: [id number]  No chief complaint on file.   HPI  Patient is in today for a 4 month follow up. She is following up on her HTN, hyperthyroidism and other medical concerns. She c/o back aches. No falls or trauma. No radiculopathy. No sensory changes. No recent febrile illness or hospitalizations. Denies CP/palp/SOB/HA/congestion/fevers/GI or GU c/o. Taking meds as prescribed  Patient Care Team: Mosie Lukes, MD as PCP - General (Family Medicine) Croitoru, Dani Gobble, MD as Consulting Physician (Cardiology) Lynnell Dike, OD as Consulting Physician (Optometry)   Past Medical History:  Diagnosis Date  . Arthritis   . Blood transfusion   . CHB (complete heart block) ([place]) 11/10/2014  . Chicken pox as a child  . Colon polyps    hyperplastic  . Critical illness polyneuropathy ([place])   . Diarrhea 04/25/2017  . Diverticulosis of sigmoid colon 02/05/2007   mild  . Fecal incontinence   . Fibromyalgia   . GERD (gastroesophageal reflux disease)   . H/O measles   . H/O mumps   . Hiatal hernia   . History of atrial fibrillation   . History of chicken pox   . History of empyema of pleura   . History of gallstones   . History of skin cancer    face  . Hypercholesteremia   . Hypertension   . Hypothyroidism   . IBS (irritable bowel syndrome)   . Measles as a child  . Medicare annual wellness visit, subsequent 05/15/2015   Allergies verified: UTD   Immunization Status: Flu vaccine-- NA  Tdap-- 9-10 years ago per patient  PNA-- 09/30/07 (23)  Shingles-- 04/07/14   A/P:  Changes to [place], [place] or Personal Hx: UTD Pap-- none recent  MMG--declines further MMgs (last 08/12/03 BI-RADS 1: Neg)  Bone Density-- 08/11/03 with Prescott Gum, MD- Normal, declines further testing CCS-- 05/07/12 with Silvano Rusk, MD- polyp removed- no   . Mitral valve prolapse   . Mumps as a child    . Overactive bladder 05/07/2011  . Pacemaker   . Peripheral neuropathy   . Shingles 8 yrs ago  . Sick sinus syndrome ([place])   . Urinary incontinence   . Venous insufficiency   . Vitamin D deficiency     Past Surgical History:  Procedure Laterality Date  . APPENDECTOMY  1971  . CARDIAC CATHETERIZATION  05/10/1997   Normal coronaries, MVP  . CERVICAL SPINE SURGERY    . CHOLECYSTECTOMY  03/2005   by Dr. Dalbert Batman  . COLONOSCOPY  02/05/2007,02/05/05   Dr. Silvano Rusk  . ESOPHAGOGASTRODUODENOSCOPY  02/05/2005,01/22/90   Dr. Silvano Rusk, Dr. Rachelle Hora  . NM MYOCAR PERF WALL MOTION  08/24/2010   Normal  . PACEMAKER GENERATOR CHANGE N/A 02/25/2012   Procedure: PACEMAKER GENERATOR CHANGE;  Surgeon: Sanda Klein, MD;  Location: [facility];  Service: Cardiovascular;  Laterality: N/A;  . pacemaker implanted  05/11/97,08/02/05   [facility]  . TONSILLECTOMY    . TOTAL ABDOMINAL HYSTERECTOMY W/ BILATERAL SALPINGOOPHORECTOMY      Family History  Problem Relation Age of Onset  . Pancreatic cancer Mother   . Diabetes Mother 33       type 2  . Heart attack Brother   . Diabetes Brother        type 2  . Gallbladder disease Father   .  Diabetes Father        type 2  . Diabetes Brother        X 2   . Cancer Sister 80       skin ca on face  . Diabetes Sister        type 2  . Diabetes Daughter        type 2  . Hypertension Daughter   . Alcohol abuse Daughter   . Diabetes Son        type 2  . Diabetes Sister        type 1  . Hypertension Sister   . Stomach cancer Maternal Aunt   . Colon cancer Unknown     Social History   Socioeconomic History  . Marital status: Widowed    Spouse name: Not on file  . Number of children: 3  . Years of education: Not on file  . Highest education level: Not on file  Occupational History  . Occupation: retired    Fish farm manager: RETIRED  Social Needs  . Financial resource strain: Not on file  . Food insecurity:    Worry: Not on file    Inability:  Not on file  . Transportation needs:    Medical: Not on file    Non-medical: Not on file  Tobacco Use  . Smoking status: Former Smoker    Packs/day: 2.00    Years: 6.00    Pack years: 12.00    Types: Cigarettes    Last attempt to quit: 10/15/1951    Years since quitting: 66.6  . Smokeless tobacco: Never Used  Substance and Sexual Activity  . Alcohol use: No  . Drug use: No  . Sexual activity: Not on file  Lifestyle  . Physical activity:    Days per week: Not on file    Minutes per session: Not on file  . Stress: Not on file  Relationships  . Social connections:    Talks on phone: Not on file    Gets together: Not on file    Attends religious service: Not on file    Active member of club or organization: Not on file    Attends meetings of clubs or organizations: Not on file    Relationship status: Not on file  . Intimate partner violence:    Fear of current or ex partner: Not on file    Emotionally abused: Not on file    Physically abused: Not on file    Forced sexual activity: Not on file  Other Topics Concern  . Not on file  Social History Narrative   She is a widow, one son to daughters. She is retired.    Outpatient Medications Prior to Visit  Medication Sig Dispense Refill  . albuterol (PROVENTIL HFA;VENTOLIN HFA) 108 (90 Base) MCG/ACT inhaler Inhale 2 puffs into the lungs every 6 (six) hours as needed for wheezing or shortness of breath. 1 Inhaler 1  . atenolol (TENORMIN) 50 MG tablet Take 1.5 tablets (75 mg total) by mouth daily. 135 tablet 3  . cholecalciferol (VITAMIN D) 1000 UNITS tablet Take 1,000 Units by mouth daily.    . furosemide (LASIX) 20 MG tablet TAKE 1 TABLET DAILY 90 tablet 0  . gabapentin (NEURONTIN) 300 MG capsule TAKE 1 CAPSULE TWICE A DAY 180 capsule 1  . LEVOXYL 137 MCG tablet TAKE 1 TABLET DAILY 90 tablet 2  . MYRBETRIQ 50 MG TB24 tablet Take 1 tablet by mouth daily.    Marland Kitchen  omeprazole (PRILOSEC) 40 MG capsule TAKE 1 CAPSULE DAILY 90 capsule 1    . PACERONE 200 MG tablet TAKE ONE-HALF (1/2) TABLET DAILY 45 tablet 3  . polyvinyl alcohol (ARTIFICIAL TEARS) 1.4 % ophthalmic solution Place 1 drop into both eyes 3 (three) times daily as needed for dry eyes.    . pravastatin (PRAVACHOL) 40 MG tablet TAKE 1 TABLET DAILY 90 tablet 0  . sodium polystyrene (KAYEXALATE) 15 GM/60ML suspension Take 60 mLs (15 g total) by mouth daily. 60 mL 0  . traMADol (ULTRAM) 50 MG tablet Take 1 tablet (50 mg total) by mouth daily as needed for severe pain. 90 tablet 0  . trimethoprim (TRIMPEX) 100 MG tablet Take 100 mg by mouth daily.    . valsartan-hydrochlorothiazide (DIOVAN-HCT) 80-12.5 MG tablet Take 1 tablet by mouth daily. 90 tablet 3  . warfarin (COUMADIN) 2 MG tablet TAKE 1 TABLET DAILY OR AS DIRECTED 90 tablet 1  . cephALEXin (KEFLEX) 500 MG capsule Take 1 capsule (500 mg total) by mouth 4 (four) times daily. 21 capsule 0   No facility-administered medications prior to visit.     Allergies  Allergen Reactions  . Iodinated Diagnostic Agents Other (See Comments)    Pt was not told what the reaction was  . Cymbalta [Duloxetine Hcl] Other (See Comments)    Could not think straight  . Other Itching    Dial Soap    Review of Systems  Constitutional: Negative for fever and malaise/fatigue.  HENT: Negative for congestion.   Eyes: Negative for blurred vision.  Respiratory: Negative for shortness of breath.   Cardiovascular: Negative for chest pain, palpitations and leg swelling.  Gastrointestinal: Negative for abdominal pain, blood in stool and nausea.  Genitourinary: Positive for frequency. Negative for dysuria and urgency.  Musculoskeletal: Negative for falls.  Skin: Positive for rash.  Neurological: Negative for dizziness, loss of consciousness and headaches.  Endo/Heme/Allergies: Negative for environmental allergies.  Psychiatric/Behavioral: Negative for depression. The patient is not nervous/anxious.        Objective:    Physical Exam   Constitutional: She is oriented to person, place, and time. She appears well-developed and well-nourished. No distress.  HENT:  Head: Normocephalic and atraumatic.  Nose: Nose normal.  Eyes: Right eye exhibits no discharge. Left eye exhibits no discharge.  Neck: Normal range of motion. Neck supple.  Cardiovascular: Normal rate and regular rhythm.  No murmur heard. Pulmonary/Chest: Effort normal and breath sounds normal.  Abdominal: Soft. Bowel sounds are normal. There is no tenderness.  Musculoskeletal: She exhibits no edema.  Neurological: She is alert and oriented to person, place, and time.  Skin: Skin is warm and dry.  Psychiatric: She has a normal mood and affect.  Nursing note and vitals reviewed.   BP 138/78 (BP Location: Left Arm, Patient Position: Sitting, Cuff Size: Normal)   Pulse 77   Temp 98.1 F (36.7 C) (Oral)   Resp 18   Wt 182 lb 6.4 oz (82.7 kg)   SpO2 94%   BMI 29.44 kg/m  Wt Readings from Last 3 Encounters:  05/25/18 182 lb 6.4 oz (82.7 kg)  05/07/18 183 lb (83 kg)  01/20/18 187 lb (84.8 kg)   BP Readings from Last 3 Encounters:  05/25/18 138/78  05/07/18 (!) 142/86  01/20/18 120/68     Immunization History  Administered Date(s) Administered  . Influenza Split 07/17/2011, 07/31/2012, 07/28/2013  . Influenza Whole 10/17/2009, 08/27/2010  . Influenza,inj,Quad PF,6+ Mos 08/08/2015  . Influenza-Unspecified 07/04/2016  .  Pneumococcal Conjugate-13 10/30/2015  . Pneumococcal Polysaccharide-23 09/30/2007  . Tdap 10/30/2015  . Zoster 04/07/2014    Health Maintenance  Topic Date Due  . INFLUENZA VACCINE  05/14/2018  . TETANUS/TDAP  10/29/2025  . DEXA SCAN  Completed  . PNA vac Low Risk Adult  Completed    Lab Results  Component Value Date   WBC 8.5 01/20/2018   HGB 11.8 (L) 01/20/2018   HCT 34.6 (L) 01/20/2018   PLT 205.0 01/20/2018   GLUCOSE 83 01/20/2018   CHOL 147 09/15/2017   TRIG 140.0 09/15/2017   HDL 42.90 09/15/2017   LDLDIRECT  77.0 04/28/2015   LDLCALC 76 09/15/2017   ALT 9 01/20/2018   AST 16 01/20/2018   NA 138 01/20/2018   K 5.8 (H) 01/20/2018   CL 107 01/20/2018   CREATININE 2.42 (H) 01/20/2018   BUN 17 01/20/2018   CO2 25 01/20/2018   TSH 2.670 05/07/2018   INR 2.4 05/18/2018   HGBA1C 5.3 08/02/2015    Lab Results  Component Value Date   TSH 2.670 05/07/2018   Lab Results  Component Value Date   WBC 8.5 01/20/2018   HGB 11.8 (L) 01/20/2018   HCT 34.6 (L) 01/20/2018   MCV 91.9 01/20/2018   PLT 205.0 01/20/2018   Lab Results  Component Value Date   NA 138 01/20/2018   K 5.8 (H) 01/20/2018   CO2 25 01/20/2018   GLUCOSE 83 01/20/2018   BUN 17 01/20/2018   CREATININE 2.42 (H) 01/20/2018   BILITOT 0.3 01/20/2018   ALKPHOS 60 01/20/2018   AST 16 01/20/2018   ALT 9 01/20/2018   PROT 6.6 01/20/2018   ALBUMIN 3.7 01/20/2018   CALCIUM 9.3 01/20/2018   ANIONGAP 14 08/09/2015   GFR 19.98 (L) 01/20/2018   Lab Results  Component Value Date   CHOL 147 09/15/2017   Lab Results  Component Value Date   HDL 42.90 09/15/2017   Lab Results  Component Value Date   LDLCALC 76 09/15/2017   Lab Results  Component Value Date   TRIG 140.0 09/15/2017   Lab Results  Component Value Date   CHOLHDL 3 09/15/2017   Lab Results  Component Value Date   HGBA1C 5.3 08/02/2015         Assessment & Plan:   Problem List Items Addressed This Visit    Hypothyroidism    On Levothyroxine, continue to monitor      Relevant Orders   Urine Culture   Vitamin D deficiency    Supplement and monitor      Essential hypertension    Well controlled, no changes to meds. Encouraged heart healthy diet such as the DASH diet and exercise as tolerated.       Atrial fibrillation ([place])    Rate controlled, tolerating coumadin      SKIN CANCER, HX OF    Just had lesion excised from left hand. Is healing well.      B12 deficiency    Supplement and monitor      Anemia    Increase leafy greens,  consider increased lean red meat and using cast iron cookware. Continue to monitor, report any concerns      Hyperkalemia    Repeat cmp today      Renal insufficiency    Hydrate well and monitor. No over the counter meds without monitoring      Urinary frequency - Primary    With low back pain and h/o UTI, check UA with  c and s      Relevant Orders   Urinalysis   Urine Culture      I have discontinued Pleas Koch E. Mccollom's cephALEXin. I am also having her maintain her cholecalciferol, polyvinyl alcohol, trimethoprim, MYRBETRIQ, warfarin, PACERONE, furosemide, traMADol, albuterol, omeprazole, gabapentin, sodium polystyrene, valsartan-hydrochlorothiazide, LEVOXYL, pravastatin, and atenolol.  No orders of the defined types were placed in this encounter.   CMA served as Education administrator during this visit. History, Physical and Plan performed by medical provider. Documentation and orders reviewed and attested to.  [name], MD

## 2018-05-25 NOTE — Assessment & Plan Note (Signed)
Rate controlled, tolerating coumadin 

## 2018-05-25 NOTE — Assessment & Plan Note (Signed)
Repeat cmp today 

## 2018-05-25 NOTE — Assessment & Plan Note (Signed)
Hydrate well and monitor. No over the counter meds without monitoring

## 2018-05-25 NOTE — Assessment & Plan Note (Signed)
Supplement and monitor 

## 2018-05-25 NOTE — Patient Instructions (Signed)
Shingrix is the new shingles shot, 2 shots over 2-6 months at pharmacy   Hypertension Hypertension is another name for high blood pressure. High blood pressure forces your heart to work harder to pump blood. This can cause problems over time. There are two numbers in a blood pressure reading. There is a top number (systolic) over a bottom number (diastolic). It is best to have a blood pressure below 120/80. Healthy choices can help lower your blood pressure. You may need medicine to help lower your blood pressure if:  Your blood pressure cannot be lowered with healthy choices.  Your blood pressure is higher than 130/80.  Follow these instructions at home: Eating and drinking  If directed, follow the DASH eating plan. This diet includes: ? Filling half of your plate at each meal with fruits and vegetables. ? Filling one quarter of your plate at each meal with whole grains. Whole grains include whole wheat pasta, brown rice, and whole grain bread. ? Eating or drinking low-fat dairy products, such as skim milk or low-fat yogurt. ? Filling one quarter of your plate at each meal with low-fat (lean) proteins. Low-fat proteins include fish, skinless chicken, eggs, beans, and tofu. ? Avoiding fatty meat, cured and processed meat, or chicken with skin. ? Avoiding premade or processed food.  Eat less than 1,500 mg of salt (sodium) a day.  Limit alcohol use to no more than 1 drink a day for nonpregnant women and 2 drinks a day for men. One drink equals 12 oz of beer, 5 oz of wine, or 1 oz of hard liquor. Lifestyle  Work with your doctor to stay at a healthy weight or to lose weight. Ask your doctor what the best weight is for you.  Get at least 30 minutes of exercise that causes your heart to beat faster (aerobic exercise) most days of the week. This may include walking, swimming, or biking.  Get at least 30 minutes of exercise that strengthens your muscles (resistance exercise) at least 3 days  a week. This may include lifting weights or pilates.  Do not use any products that contain nicotine or tobacco. This includes cigarettes and e-cigarettes. If you need help quitting, ask your doctor.  Check your blood pressure at home as told by your doctor.  Keep all follow-up visits as told by your doctor. This is important. Medicines  Take over-the-counter and prescription medicines only as told by your doctor. Follow directions carefully.  Do not skip doses of blood pressure medicine. The medicine does not work as well if you skip doses. Skipping doses also puts you at risk for problems.  Ask your doctor about side effects or reactions to medicines that you should watch for. Contact a doctor if:  You think you are having a reaction to the medicine you are taking.  You have headaches that keep coming back (recurring).  You feel dizzy.  You have swelling in your ankles.  You have trouble with your vision. Get help right away if:  You get a very bad headache.  You start to feel confused.  You feel weak or numb.  You feel faint.  You get very bad pain in your: ? Chest. ? Belly (abdomen).  You throw up (vomit) more than once.  You have trouble breathing. Summary  Hypertension is another name for high blood pressure.  Making healthy choices can help lower blood pressure. If your blood pressure cannot be controlled with healthy choices, you may need to take medicine.  This information is not intended to replace advice given to you by your health care provider. Make sure you discuss any questions you have with your health care provider. Document Released: 03/18/2008 Document Revised: 08/28/2016 Document Reviewed: 08/28/2016 Elsevier Interactive Patient Education  [employer].

## 2018-05-25 NOTE — Assessment & Plan Note (Signed)
On Levothyroxine, continue to monitor 

## 2018-05-25 NOTE — Assessment & Plan Note (Signed)
Just had lesion excised from left hand. Is healing well.

## 2018-05-25 NOTE — Assessment & Plan Note (Signed)
Well controlled, no changes to meds. Encouraged heart healthy diet such as the DASH diet and exercise as tolerated.  °

## 2018-05-25 NOTE — Assessment & Plan Note (Signed)
With low back pain and h/o UTI, check UA with c and s

## 2018-05-25 NOTE — Assessment & Plan Note (Signed)
Increase leafy greens, consider increased lean red meat and using cast iron cookware. Continue to monitor, report any concerns 

## 2018-05-26 LAB — URINALYSIS, ROUTINE W REFLEX MICROSCOPIC
BILIRUBIN URINE: NEGATIVE
KETONES UR: NEGATIVE
NITRITE: NEGATIVE
Specific Gravity, Urine: 1.01 (ref 1.000–1.030)
Total Protein, Urine: 30 — AB
Urine Glucose: NEGATIVE
Urobilinogen, UA: 0.2 (ref 0.0–1.0)
pH: 8.5 — AB (ref 5.0–8.0)

## 2018-05-26 LAB — URINE CULTURE
MICRO NUMBER:: 90952954
SPECIMEN QUALITY: ADEQUATE

## 2018-06-04 LAB — CUP PACEART INCLINIC DEVICE CHECK
Implantable Lead Implant Date: 19980729
Implantable Lead Implant Date: 19980729
Implantable Lead Location: 753860
Lead Channel Setting Sensing Sensitivity: 4 mV
MDC IDC LEAD LOCATION: 753859
MDC IDC PG IMPLANT DT: 20130514
MDC IDC PG SERIAL: 7338831
MDC IDC SESS DTM: 20190822143914
MDC IDC SET LEADCHNL RA PACING AMPLITUDE: 1.5 V
MDC IDC SET LEADCHNL RV PACING AMPLITUDE: 1.25 V
MDC IDC SET LEADCHNL RV PACING PULSEWIDTH: 0.4 ms

## 2018-06-11 DIAGNOSIS — I890 Lymphedema, not elsewhere classified: Secondary | ICD-10-CM | POA: Diagnosis not present

## 2018-06-11 DIAGNOSIS — L97222 Non-pressure chronic ulcer of left calf with fat layer exposed: Secondary | ICD-10-CM | POA: Diagnosis not present

## 2018-06-11 DIAGNOSIS — L97812 Non-pressure chronic ulcer of other part of right lower leg with fat layer exposed: Secondary | ICD-10-CM | POA: Diagnosis not present

## 2018-06-11 DIAGNOSIS — I872 Venous insufficiency (chronic) (peripheral): Secondary | ICD-10-CM | POA: Diagnosis not present

## 2018-06-11 DIAGNOSIS — L97211 Non-pressure chronic ulcer of right calf limited to breakdown of skin: Secondary | ICD-10-CM | POA: Diagnosis not present

## 2018-06-11 DIAGNOSIS — L97221 Non-pressure chronic ulcer of left calf limited to breakdown of skin: Secondary | ICD-10-CM | POA: Diagnosis not present

## 2018-06-17 ENCOUNTER — Ambulatory Visit (INDEPENDENT_AMBULATORY_CARE_PROVIDER_SITE_OTHER): Payer: Medicare Other | Admitting: Pharmacist Clinician (PhC)/ Clinical Pharmacy Specialist

## 2018-06-17 DIAGNOSIS — I480 Paroxysmal atrial fibrillation: Secondary | ICD-10-CM

## 2018-06-17 DIAGNOSIS — Z7901 Long term (current) use of anticoagulants: Secondary | ICD-10-CM

## 2018-06-17 LAB — POCT INR: INR: 3.2 — AB (ref 2.0–3.0)

## 2018-06-17 NOTE — Patient Instructions (Signed)
Description   No warfarin Thursday Sept 5, then continue taking 1/2 table daily.   Repeat INR in 4 weeks

## 2018-06-18 DIAGNOSIS — L97211 Non-pressure chronic ulcer of right calf limited to breakdown of skin: Secondary | ICD-10-CM | POA: Diagnosis not present

## 2018-06-18 DIAGNOSIS — L97212 Non-pressure chronic ulcer of right calf with fat layer exposed: Secondary | ICD-10-CM | POA: Diagnosis not present

## 2018-06-18 DIAGNOSIS — I872 Venous insufficiency (chronic) (peripheral): Secondary | ICD-10-CM | POA: Diagnosis not present

## 2018-06-18 DIAGNOSIS — L97822 Non-pressure chronic ulcer of other part of left lower leg with fat layer exposed: Secondary | ICD-10-CM | POA: Diagnosis not present

## 2018-06-18 DIAGNOSIS — L97812 Non-pressure chronic ulcer of other part of right lower leg with fat layer exposed: Secondary | ICD-10-CM | POA: Diagnosis not present

## 2018-06-18 DIAGNOSIS — L97221 Non-pressure chronic ulcer of left calf limited to breakdown of skin: Secondary | ICD-10-CM | POA: Diagnosis not present

## 2018-06-24 DIAGNOSIS — L97811 Non-pressure chronic ulcer of other part of right lower leg limited to breakdown of skin: Secondary | ICD-10-CM | POA: Diagnosis not present

## 2018-06-24 DIAGNOSIS — L97221 Non-pressure chronic ulcer of left calf limited to breakdown of skin: Secondary | ICD-10-CM | POA: Diagnosis not present

## 2018-06-24 DIAGNOSIS — L97211 Non-pressure chronic ulcer of right calf limited to breakdown of skin: Secondary | ICD-10-CM | POA: Diagnosis not present

## 2018-06-24 DIAGNOSIS — I872 Venous insufficiency (chronic) (peripheral): Secondary | ICD-10-CM | POA: Diagnosis not present

## 2018-06-30 DIAGNOSIS — C44329 Squamous cell carcinoma of skin of other parts of face: Secondary | ICD-10-CM | POA: Diagnosis not present

## 2018-06-30 DIAGNOSIS — D225 Melanocytic nevi of trunk: Secondary | ICD-10-CM | POA: Diagnosis not present

## 2018-06-30 DIAGNOSIS — C44529 Squamous cell carcinoma of skin of other part of trunk: Secondary | ICD-10-CM | POA: Diagnosis not present

## 2018-06-30 DIAGNOSIS — L89159 Pressure ulcer of sacral region, unspecified stage: Secondary | ICD-10-CM | POA: Diagnosis not present

## 2018-06-30 DIAGNOSIS — L821 Other seborrheic keratosis: Secondary | ICD-10-CM | POA: Diagnosis not present

## 2018-06-30 DIAGNOSIS — Z85828 Personal history of other malignant neoplasm of skin: Secondary | ICD-10-CM | POA: Diagnosis not present

## 2018-06-30 DIAGNOSIS — D1801 Hemangioma of skin and subcutaneous tissue: Secondary | ICD-10-CM | POA: Diagnosis not present

## 2018-06-30 DIAGNOSIS — D485 Neoplasm of uncertain behavior of skin: Secondary | ICD-10-CM | POA: Diagnosis not present

## 2018-06-30 DIAGNOSIS — L570 Actinic keratosis: Secondary | ICD-10-CM | POA: Diagnosis not present

## 2018-06-30 DIAGNOSIS — L814 Other melanin hyperpigmentation: Secondary | ICD-10-CM | POA: Diagnosis not present

## 2018-06-30 DIAGNOSIS — C44622 Squamous cell carcinoma of skin of right upper limb, including shoulder: Secondary | ICD-10-CM | POA: Diagnosis not present

## 2018-07-01 DIAGNOSIS — L97222 Non-pressure chronic ulcer of left calf with fat layer exposed: Secondary | ICD-10-CM | POA: Diagnosis not present

## 2018-07-01 DIAGNOSIS — L97211 Non-pressure chronic ulcer of right calf limited to breakdown of skin: Secondary | ICD-10-CM | POA: Diagnosis not present

## 2018-07-01 DIAGNOSIS — I872 Venous insufficiency (chronic) (peripheral): Secondary | ICD-10-CM | POA: Diagnosis not present

## 2018-07-01 DIAGNOSIS — L97812 Non-pressure chronic ulcer of other part of right lower leg with fat layer exposed: Secondary | ICD-10-CM | POA: Diagnosis not present

## 2018-07-01 DIAGNOSIS — I890 Lymphedema, not elsewhere classified: Secondary | ICD-10-CM | POA: Diagnosis not present

## 2018-07-01 DIAGNOSIS — L97221 Non-pressure chronic ulcer of left calf limited to breakdown of skin: Secondary | ICD-10-CM | POA: Diagnosis not present

## 2018-07-02 ENCOUNTER — Telehealth: Payer: Self-pay | Admitting: Family Medicine

## 2018-07-04 ENCOUNTER — Other Ambulatory Visit: Payer: Self-pay | Admitting: Family Medicine

## 2018-07-08 DIAGNOSIS — I890 Lymphedema, not elsewhere classified: Secondary | ICD-10-CM | POA: Diagnosis not present

## 2018-07-08 DIAGNOSIS — N3020 Other chronic cystitis without hematuria: Secondary | ICD-10-CM | POA: Diagnosis not present

## 2018-07-08 DIAGNOSIS — L97221 Non-pressure chronic ulcer of left calf limited to breakdown of skin: Secondary | ICD-10-CM | POA: Diagnosis not present

## 2018-07-08 DIAGNOSIS — L97812 Non-pressure chronic ulcer of other part of right lower leg with fat layer exposed: Secondary | ICD-10-CM | POA: Diagnosis not present

## 2018-07-08 DIAGNOSIS — R350 Frequency of micturition: Secondary | ICD-10-CM | POA: Diagnosis not present

## 2018-07-08 DIAGNOSIS — I872 Venous insufficiency (chronic) (peripheral): Secondary | ICD-10-CM | POA: Diagnosis not present

## 2018-07-08 DIAGNOSIS — L97211 Non-pressure chronic ulcer of right calf limited to breakdown of skin: Secondary | ICD-10-CM | POA: Diagnosis not present

## 2018-07-13 ENCOUNTER — Ambulatory Visit (INDEPENDENT_AMBULATORY_CARE_PROVIDER_SITE_OTHER): Payer: Medicare Other | Admitting: *Deleted

## 2018-07-13 ENCOUNTER — Telehealth: Payer: Self-pay | Admitting: Cardiology

## 2018-07-13 DIAGNOSIS — I495 Sick sinus syndrome: Secondary | ICD-10-CM | POA: Diagnosis not present

## 2018-07-13 NOTE — Telephone Encounter (Signed)
Spoke with pt and reminded pt of remote transmission that is due today. Pt verbalized understanding.   

## 2018-07-14 NOTE — Progress Notes (Signed)
Remote pacemaker transmission.   

## 2018-07-15 ENCOUNTER — Ambulatory Visit (INDEPENDENT_AMBULATORY_CARE_PROVIDER_SITE_OTHER): Payer: Medicare Other | Admitting: Pharmacist

## 2018-07-15 DIAGNOSIS — L97211 Non-pressure chronic ulcer of right calf limited to breakdown of skin: Secondary | ICD-10-CM | POA: Diagnosis not present

## 2018-07-15 DIAGNOSIS — L97221 Non-pressure chronic ulcer of left calf limited to breakdown of skin: Secondary | ICD-10-CM | POA: Diagnosis not present

## 2018-07-15 DIAGNOSIS — I480 Paroxysmal atrial fibrillation: Secondary | ICD-10-CM | POA: Diagnosis not present

## 2018-07-15 DIAGNOSIS — Z7901 Long term (current) use of anticoagulants: Secondary | ICD-10-CM | POA: Diagnosis not present

## 2018-07-15 DIAGNOSIS — I872 Venous insufficiency (chronic) (peripheral): Secondary | ICD-10-CM | POA: Diagnosis not present

## 2018-07-15 LAB — POCT INR: INR: 2.2 (ref 2.0–3.0)

## 2018-07-16 DIAGNOSIS — C44622 Squamous cell carcinoma of skin of right upper limb, including shoulder: Secondary | ICD-10-CM | POA: Diagnosis not present

## 2018-07-16 NOTE — Telephone Encounter (Signed)
Try Losartan 25 mg tabs and hctz 12 .5 mg tabs, 1 of each daily disp #30 with  80f and have her in for nuse bp check and cmp in 1 month

## 2018-07-16 NOTE — Telephone Encounter (Signed)
Copied from [place] [id number]. Topic: General - Other >> Jul 16, 2018  9:55 AM Yvette Rack wrote: Reason for CRM: pt calling stating that she received a letter in the mail from pharmacy that the valsartan-hydrochlorothiazide (DIOVAN-HCT) 80-12.5 MG tablet is on recall and would need something in place of it     [place], [place] - [address] [phone] (Phone) [phone] (Fax)

## 2018-07-17 NOTE — Telephone Encounter (Signed)
Called left message for patient to call the office back   Nurse triage may handle

## 2018-07-23 DIAGNOSIS — D0439 Carcinoma in situ of skin of other parts of face: Secondary | ICD-10-CM | POA: Diagnosis not present

## 2018-07-30 DIAGNOSIS — B999 Unspecified infectious disease: Secondary | ICD-10-CM | POA: Diagnosis not present

## 2018-08-06 LAB — CUP PACEART REMOTE DEVICE CHECK
Battery Voltage: 2.84 V
Brady Statistic AP VP Percent: 98 %
Brady Statistic AS VP Percent: 1.9 %
Brady Statistic AS VS Percent: 1 %
Date Time Interrogation Session: 20190930160106
Implantable Lead Implant Date: 19980729
Implantable Lead Implant Date: 19980729
Implantable Lead Location: 753859
Implantable Pulse Generator Implant Date: 20130514
Lead Channel Impedance Value: 460 Ohm
Lead Channel Pacing Threshold Amplitude: 0.375 V
Lead Channel Pacing Threshold Amplitude: 1 V
Lead Channel Pacing Threshold Pulse Width: 0.4 ms
Lead Channel Sensing Intrinsic Amplitude: 12 mV
Lead Channel Setting Pacing Amplitude: 1.25 V
Lead Channel Setting Pacing Amplitude: 1.375
Lead Channel Setting Pacing Pulse Width: 0.4 ms
MDC IDC LEAD LOCATION: 753860
MDC IDC MSMT BATTERY REMAINING LONGEVITY: 46 mo
MDC IDC MSMT BATTERY REMAINING PERCENTAGE: 40 %
MDC IDC MSMT LEADCHNL RA IMPEDANCE VALUE: 380 Ohm
MDC IDC MSMT LEADCHNL RA PACING THRESHOLD PULSEWIDTH: 0.4 ms
MDC IDC MSMT LEADCHNL RA SENSING INTR AMPL: 3.1 mV
MDC IDC SET LEADCHNL RV SENSING SENSITIVITY: 4 mV
MDC IDC STAT BRADY AP VS PERCENT: 1 %
MDC IDC STAT BRADY RA PERCENT PACED: 96 %
MDC IDC STAT BRADY RV PERCENT PACED: 99 %
Pulse Gen Model: 2210
Pulse Gen Serial Number: 7338831

## 2018-08-12 ENCOUNTER — Other Ambulatory Visit: Payer: Self-pay | Admitting: Family Medicine

## 2018-08-18 DIAGNOSIS — N3946 Mixed incontinence: Secondary | ICD-10-CM | POA: Diagnosis not present

## 2018-08-18 DIAGNOSIS — N3020 Other chronic cystitis without hematuria: Secondary | ICD-10-CM | POA: Diagnosis not present

## 2018-08-19 ENCOUNTER — Ambulatory Visit (INDEPENDENT_AMBULATORY_CARE_PROVIDER_SITE_OTHER): Payer: Medicare Other | Admitting: Pharmacist

## 2018-08-19 DIAGNOSIS — I480 Paroxysmal atrial fibrillation: Secondary | ICD-10-CM

## 2018-08-19 DIAGNOSIS — Z7901 Long term (current) use of anticoagulants: Secondary | ICD-10-CM | POA: Diagnosis not present

## 2018-08-19 LAB — POCT INR: INR: 3 (ref 2.0–3.0)

## 2018-08-21 DIAGNOSIS — N3001 Acute cystitis with hematuria: Secondary | ICD-10-CM | POA: Diagnosis not present

## 2018-08-21 DIAGNOSIS — N3091 Cystitis, unspecified with hematuria: Secondary | ICD-10-CM | POA: Diagnosis not present

## 2018-09-12 IMAGING — CR DG ABDOMEN 2V
2 series · 2 of 2 positions shown · non-contrast
Comparison: August 03, 2015

CLINICAL DATA: Recent pain with black stool

EXAM:
ABDOMEN - 2 VIEW

[w abdomen upright]
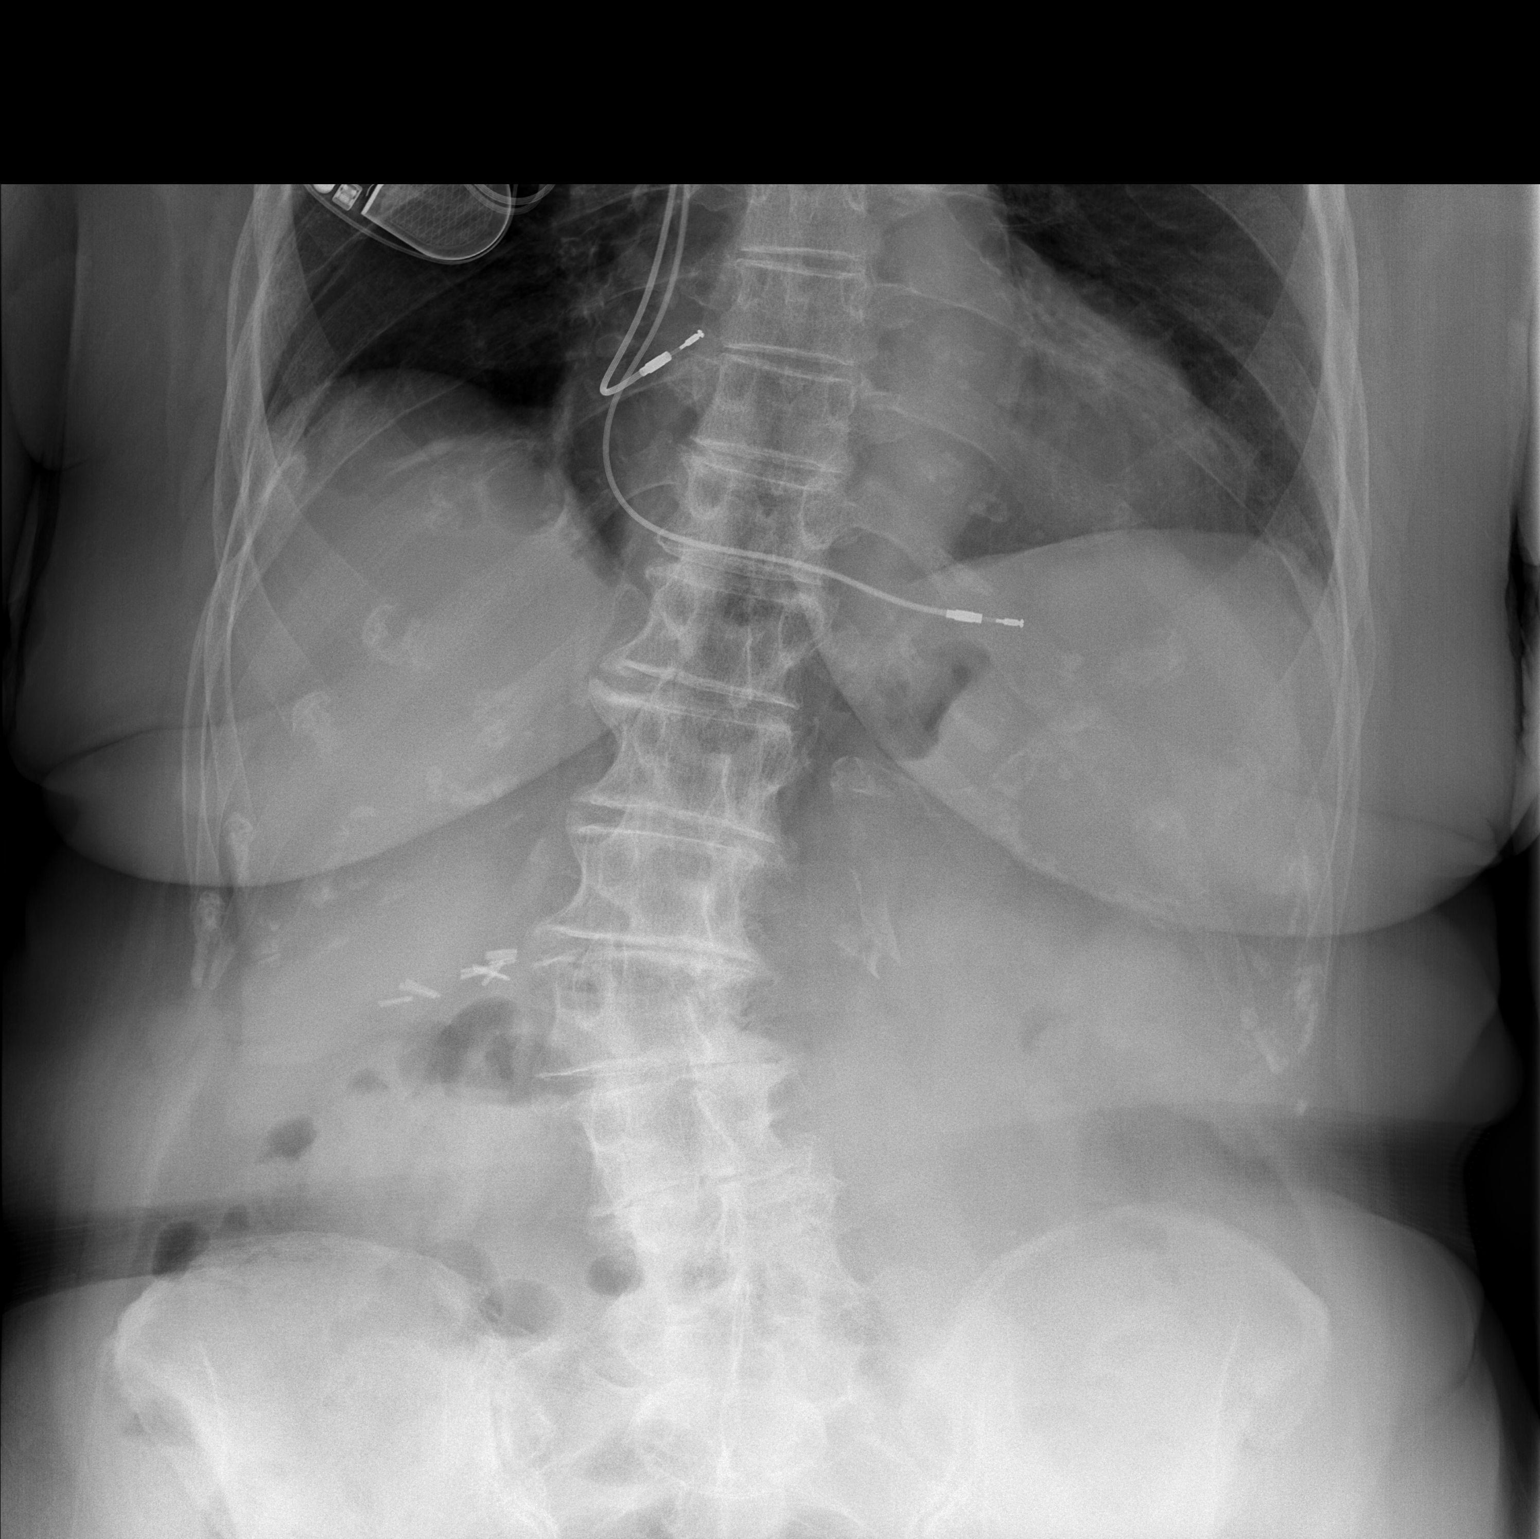

[t abdomen supine]
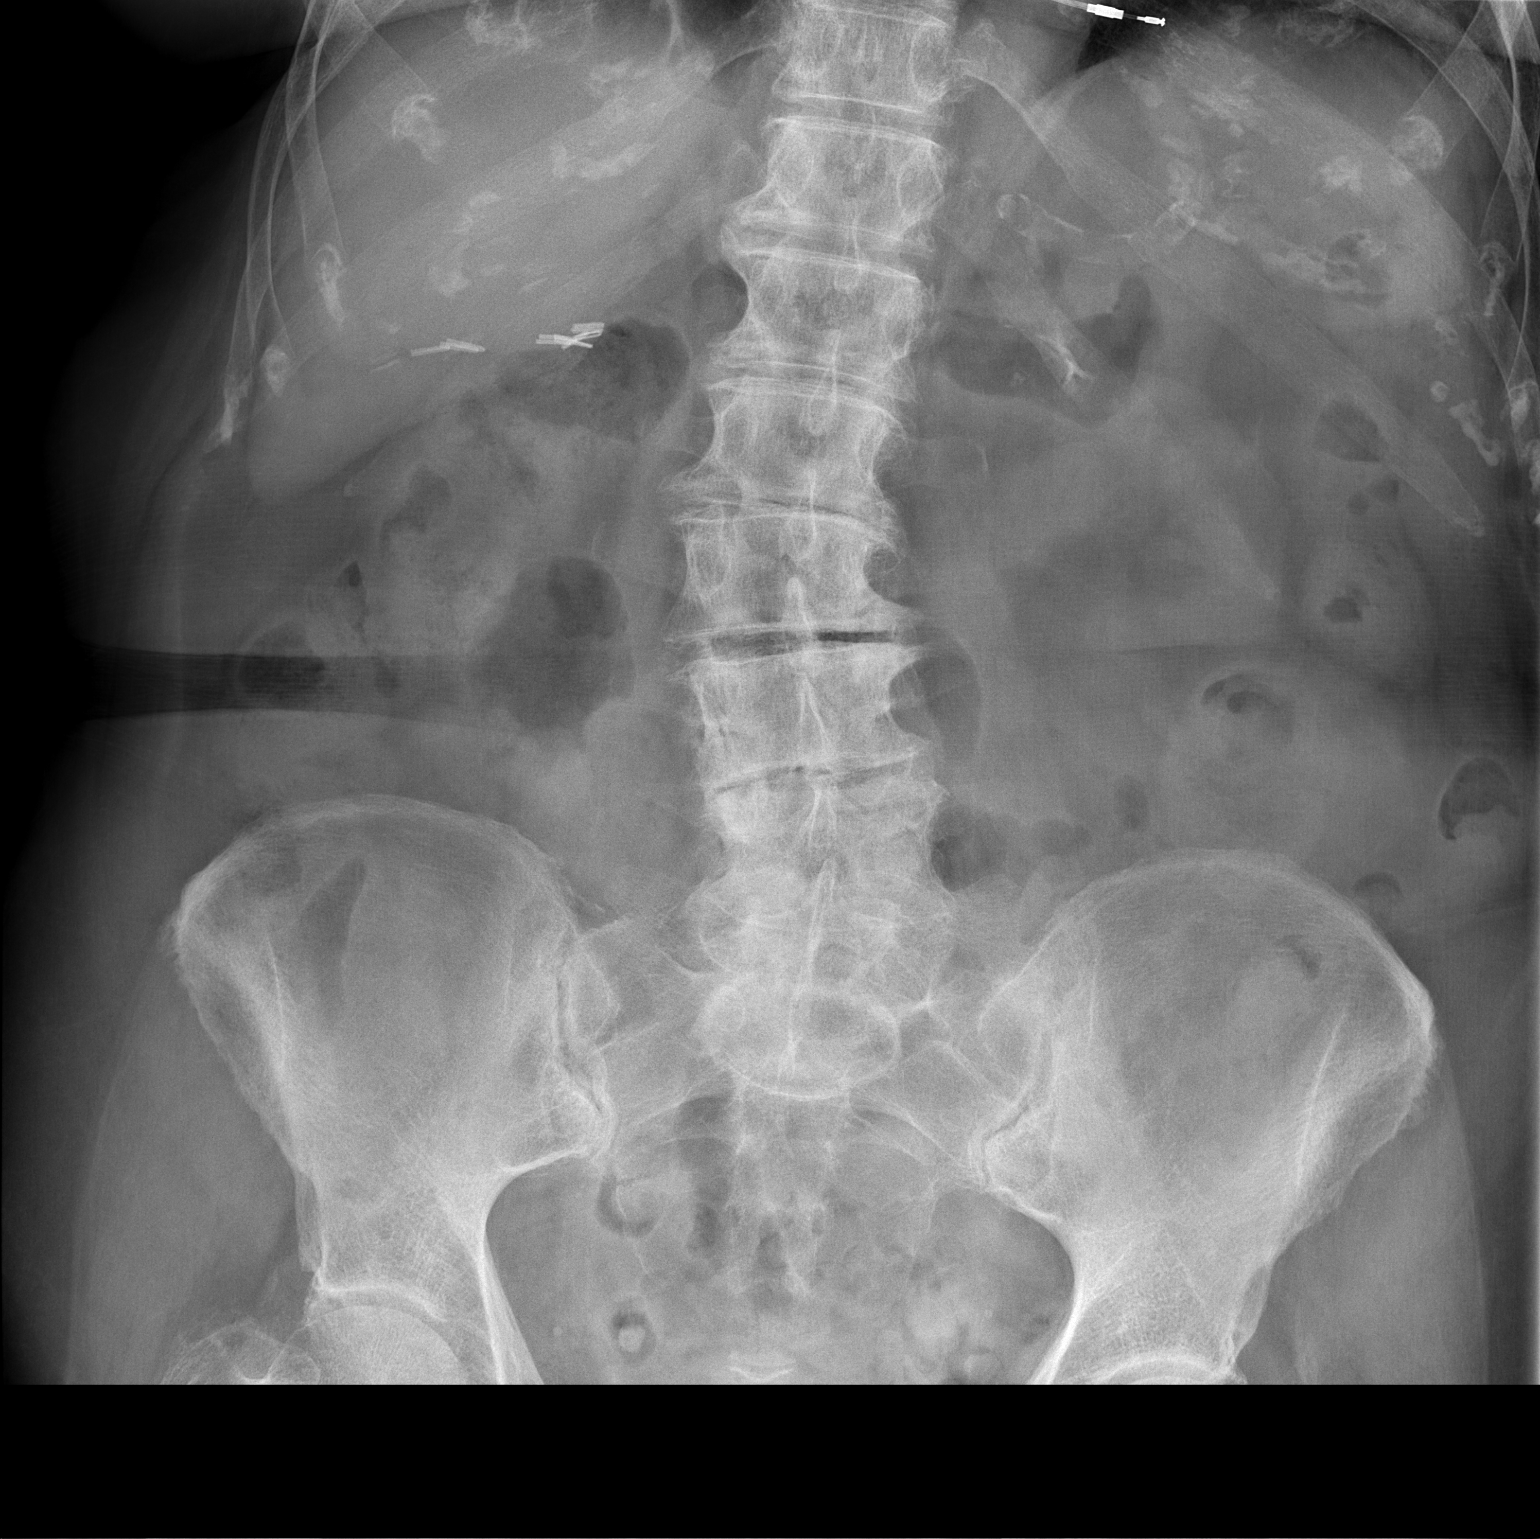

[2 of 2 positions shown; findings below may reference images not displayed]

FINDINGS: Supine and upright images obtained. There is moderate stool
throughout the colon. There is no bowel dilatation or air-fluid
level to suggest bowel obstruction. No free air. There is
calcification in the splenic artery. There are surgical clips in the
right upper quadrant. There is degenerative change in the lumbar
spine with lumbar dextroscoliosis. There are phleboliths in the
pelvis. Lung bases are clear. There is aortic atherosclerosis.
IMPRESSION: Moderate stool in colon. No bowel obstruction or free air. There is
aortic and splenic artery atherosclerosis.

Aortic Atherosclerosis ([id number].V).

## 2018-09-16 ENCOUNTER — Ambulatory Visit: Payer: Medicare Other | Admitting: *Deleted

## 2018-09-23 NOTE — Progress Notes (Deleted)
Subjective:   ALICHIA Washington is a 84 y.o. female who presents for Medicare Annual (Subsequent) preventive examination.  Review of Systems: No ROS.  Medicare Wellness Visit. Additional risk factors are reflected in the social history.   Sleep patterns:    Home Safety/Smoke Alarms: Feels safe in home. Smoke alarms in place.   Female:     Mammo- decline       Dexa scan- decline      Objective:     Vitals: There were no vitals taken for this visit.  There is no height or weight on file to calculate BMI.  Advanced Directives 09/15/2017 01/02/2017 04/29/2016 08/01/2015 05/15/2015 02/25/2012  Does Patient Have a Medical Advance Directive? No No No No No Patient does not have advance directive  Would patient like information on creating a medical advance directive? No - Patient declined - Yes - Scientist, clinical (histocompatibility and immunogenetics) given No - patient declined information Yes Higher education careers adviser given -    Tobacco Social History   Tobacco Use  Smoking Status Former Smoker  . Packs/day: 2.00  . Years: 6.00  . Pack years: 12.00  . Types: Cigarettes  . Last attempt to quit: 10/15/1951  . Years since quitting: 66.9  Smokeless Tobacco Never Used     Counseling given: Not Answered   Clinical Intake:                       Past Medical History:  Diagnosis Date  . Arthritis   . Blood transfusion   . CHB (complete heart block) ([place]) 11/10/2014  . Chicken pox as a child  . Colon polyps    hyperplastic  . Critical illness polyneuropathy ([place])   . Diarrhea 04/25/2017  . Diverticulosis of sigmoid colon 02/05/2007   mild  . Fecal incontinence   . Fibromyalgia   . GERD (gastroesophageal reflux disease)   . H/O measles   . H/O mumps   . Hiatal hernia   . History of atrial fibrillation   . History of chicken pox   . History of empyema of pleura   . History of gallstones   . History of skin cancer    face  . Hypercholesteremia   . Hypertension   . Hypothyroidism   . IBS  (irritable bowel syndrome)   . Measles as a child  . Medicare annual wellness visit, subsequent 05/15/2015   Allergies verified: UTD   Immunization Status: Flu vaccine-- NA  Tdap-- 9-10 years ago per patient  PNA-- 09/30/07 (23)  Shingles-- 04/07/14   A/P:  Changes to [place], [place] or Personal Hx: UTD Pap-- none recent  MMG--declines further MMgs (last 08/12/03 BI-RADS 1: Neg)  Bone Density-- 08/11/03 with Prescott Gum, MD- Normal, declines further testing CCS-- 05/07/12 with Silvano Rusk, MD- polyp removed- no   . Mitral valve prolapse   . Mumps as a child  . Overactive bladder 05/07/2011  . Pacemaker   . Peripheral neuropathy   . Shingles 8 yrs ago  . Sick sinus syndrome ([place])   . Urinary incontinence   . Venous insufficiency   . Vitamin D deficiency    Past Surgical History:  Procedure Laterality Date  . APPENDECTOMY  1971  . CARDIAC CATHETERIZATION  05/10/1997   Normal coronaries, MVP  . CERVICAL SPINE SURGERY    . CHOLECYSTECTOMY  03/2005   by Dr. Dalbert Batman  . COLONOSCOPY  02/05/2007,02/05/05   Dr. Silvano Rusk  . ESOPHAGOGASTRODUODENOSCOPY  02/05/2005,01/22/90   Dr. Glendell Docker  Carlean Purl, Dr. Rachelle Hora  . NM MYOCAR PERF WALL MOTION  08/24/2010   Normal  . PACEMAKER GENERATOR CHANGE N/A 02/25/2012   Procedure: PACEMAKER GENERATOR CHANGE;  Surgeon: Sanda Klein, MD;  Location: [facility];  Service: Cardiovascular;  Laterality: N/A;  . pacemaker implanted  05/11/97,08/02/05   [facility]  . TONSILLECTOMY    . TOTAL ABDOMINAL HYSTERECTOMY W/ BILATERAL SALPINGOOPHORECTOMY     Family History  Problem Relation Age of Onset  . Pancreatic cancer Mother   . Diabetes Mother 80       type 2  . Heart attack Brother   . Diabetes Brother        type 2  . Gallbladder disease Father   . Diabetes Father        type 2  . Diabetes Brother        X 2   . Cancer Sister 80       skin ca on face  . Diabetes Sister        type 2  . Diabetes Daughter        type 2  . Hypertension Daughter   . Alcohol abuse  Daughter   . Diabetes Son        type 2  . Diabetes Sister        type 1  . Hypertension Sister   . Stomach cancer Maternal Aunt   . Colon cancer Unknown    Social History   Socioeconomic History  . Marital status: Widowed    Spouse name: Not on file  . Number of children: 3  . Years of education: Not on file  . Highest education level: Not on file  Occupational History  . Occupation: retired    Fish farm manager: RETIRED  Social Needs  . Financial resource strain: Not on file  . Food insecurity:    Worry: Not on file    Inability: Not on file  . Transportation needs:    Medical: Not on file    Non-medical: Not on file  Tobacco Use  . Smoking status: Former Smoker    Packs/day: 2.00    Years: 6.00    Pack years: 12.00    Types: Cigarettes    Last attempt to quit: 10/15/1951    Years since quitting: 66.9  . Smokeless tobacco: Never Used  Substance and Sexual Activity  . Alcohol use: No  . Drug use: No  . Sexual activity: Not on file  Lifestyle  . Physical activity:    Days per week: Not on file    Minutes per session: Not on file  . Stress: Not on file  Relationships  . Social connections:    Talks on phone: Not on file    Gets together: Not on file    Attends religious service: Not on file    Active member of club or organization: Not on file    Attends meetings of clubs or organizations: Not on file    Relationship status: Not on file  Other Topics Concern  . Not on file  Social History Narrative   She is a widow, one son to daughters. She is retired.    Outpatient Encounter Medications as of 09/24/2018  Medication Sig  . albuterol (PROVENTIL HFA;VENTOLIN HFA) 108 (90 Base) MCG/ACT inhaler Inhale 2 puffs into the lungs every 6 (six) hours as needed for wheezing or shortness of breath.  Marland Kitchen atenolol (TENORMIN) 50 MG tablet Take 1.5 tablets (75 mg total) by mouth daily.  Marland Kitchen  cholecalciferol (VITAMIN D) 1000 UNITS tablet Take 1,000 Units by mouth daily.  . furosemide  (LASIX) 20 MG tablet TAKE 1 TABLET DAILY  . gabapentin (NEURONTIN) 300 MG capsule TAKE 1 CAPSULE TWICE A DAY  . LEVOXYL 137 MCG tablet TAKE 1 TABLET DAILY  . MYRBETRIQ 50 MG TB24 tablet Take 1 tablet by mouth daily.  Marland Kitchen omeprazole (PRILOSEC) 40 MG capsule TAKE 1 CAPSULE DAILY  . PACERONE 200 MG tablet TAKE ONE-HALF (1/2) TABLET DAILY  . polyvinyl alcohol (ARTIFICIAL TEARS) 1.4 % ophthalmic solution Place 1 drop into both eyes 3 (three) times daily as needed for dry eyes.  . pravastatin (PRAVACHOL) 40 MG tablet Take 1 tablet (40 mg total) by mouth daily.  . sodium polystyrene (KAYEXALATE) 15 GM/60ML suspension Take 60 mLs (15 g total) by mouth daily.  . traMADol (ULTRAM) 50 MG tablet Take 1 tablet (50 mg total) by mouth daily as needed for severe pain.  Marland Kitchen trimethoprim (TRIMPEX) 100 MG tablet Take 100 mg by mouth daily.  . valsartan-hydrochlorothiazide (DIOVAN-HCT) 80-12.5 MG tablet Take 1 tablet by mouth daily.  Marland Kitchen warfarin (COUMADIN) 2 MG tablet TAKE 1 TABLET DAILY OR AS DIRECTED   No facility-administered encounter medications on file as of 09/24/2018.     Activities of Daily Living No flowsheet data found.  Patient Care Team: Mosie Lukes, MD as PCP - General (Family Medicine) Croitoru, Dani Gobble, MD as Consulting Physician (Cardiology) Lynnell Dike, OD as Consulting Physician (Optometry)    Assessment:   This is a routine wellness examination for [place]. Physical assessment deferred to PCP.  Exercise Activities and Dietary recommendations   Diet (meal preparation, eat out, water intake, caffeinated beverages, dairy products, fruits and vegetables): {Desc; diets:16563} Breakfast: Lunch:  Dinner:      Goals    . Maintain Independence    . Patient Stated     Decrease knee pain and get back to regular activities.       Fall Risk Fall Risk  09/15/2017 04/29/2016 04/28/2015 08/11/2013  Falls in the past year? No Yes Yes No  Number falls in past yr: - 2 or more 2 or more -    Injury with Fall? - Yes No -  Risk Factor Category  - High Fall Risk - -  Risk for fall due to : - History of fall(s);Impaired balance/gait - -  Risk for fall due to: Comment - Pt has rolling walker, but does not use because it is too heavy for her to lift in and out of her car and too big for use in her house. Encouraged PT eval, pt will consider and let Dr. Charlett Blake know at follow-up appt next week. - -  Follow up - Education provided;Falls prevention discussed;Follow up appointment - -    Depression Screen PHQ 2/9 Scores 09/15/2017 04/29/2016 04/28/2015 02/09/2013  PHQ - 2 Score 0 1 0 0     Cognitive Function MMSE - Mini Mental State Exam 09/15/2017 04/29/2016  Orientation to time 5 5  Orientation to Place 5 5  Registration 3 3  Attention/ Calculation 5 5  Recall 3 3  Language- name 2 objects 2 2  Language- repeat 1 1  Language- follow 3 step command 3 3  Language- read & follow direction 1 1  Write a sentence 1 1  Copy design 1 1  Total score 30 30        Immunization History  Administered Date(s) Administered  . Influenza Split 07/17/2011, 07/31/2012, 07/28/2013  .  Influenza Whole 10/17/2009, 08/27/2010  . Influenza,inj,Quad PF,6+ Mos 08/08/2015  . Influenza-Unspecified 07/04/2016  . Pneumococcal Conjugate-13 10/30/2015  . Pneumococcal Polysaccharide-23 09/30/2007  . Tdap 10/30/2015  . Zoster 04/07/2014    Screening Tests Health Maintenance  Topic Date Due  . INFLUENZA VACCINE  05/14/2018  . TETANUS/TDAP  10/29/2025  . DEXA SCAN  Completed  . PNA vac Low Risk Adult  Completed      Plan:   ***   I have personally reviewed and noted the following in the patient's chart:   . Medical and social history . Use of alcohol, tobacco or illicit drugs  . Current medications and supplements . Functional ability and status . Nutritional status . Physical activity . Advanced directives . List of other physicians . Hospitalizations, surgeries, and ER visits in previous  12 months . Vitals . Screenings to include cognitive, depression, and falls . Referrals and appointments  In addition, I have reviewed and discussed with patient certain preventive protocols, quality metrics, and best practice recommendations. A written personalized care plan for preventive services as well as general preventive health recommendations were provided to patient.     Shela Nevin, [place]  09/23/2018

## 2018-09-24 ENCOUNTER — Encounter: Payer: Medicare Other | Admitting: *Deleted

## 2018-09-24 ENCOUNTER — Ambulatory Visit (INDEPENDENT_AMBULATORY_CARE_PROVIDER_SITE_OTHER): Payer: Medicare Other | Admitting: Family Medicine

## 2018-09-24 VITALS — BP 132/56 | HR 70 | Temp 97.7°F | Resp 18 | Ht 66.0 in | Wt 180.0 lb

## 2018-09-24 DIAGNOSIS — E538 Deficiency of other specified B group vitamins: Secondary | ICD-10-CM

## 2018-09-24 DIAGNOSIS — Z23 Encounter for immunization: Secondary | ICD-10-CM

## 2018-09-24 DIAGNOSIS — I480 Paroxysmal atrial fibrillation: Secondary | ICD-10-CM | POA: Diagnosis not present

## 2018-09-24 DIAGNOSIS — R350 Frequency of micturition: Secondary | ICD-10-CM | POA: Diagnosis not present

## 2018-09-24 DIAGNOSIS — E039 Hypothyroidism, unspecified: Secondary | ICD-10-CM | POA: Diagnosis not present

## 2018-09-24 DIAGNOSIS — E559 Vitamin D deficiency, unspecified: Secondary | ICD-10-CM | POA: Diagnosis not present

## 2018-09-24 DIAGNOSIS — I442 Atrioventricular block, complete: Secondary | ICD-10-CM | POA: Diagnosis not present

## 2018-09-24 DIAGNOSIS — I10 Essential (primary) hypertension: Secondary | ICD-10-CM | POA: Diagnosis not present

## 2018-09-24 DIAGNOSIS — E7800 Pure hypercholesterolemia, unspecified: Secondary | ICD-10-CM | POA: Diagnosis not present

## 2018-09-24 DIAGNOSIS — N289 Disorder of kidney and ureter, unspecified: Secondary | ICD-10-CM

## 2018-09-24 LAB — COMPREHENSIVE METABOLIC PANEL
ALBUMIN: 3.9 g/dL (ref 3.5–5.2)
ALK PHOS: 66 U/L (ref 39–117)
ALT: 7 U/L (ref 0–35)
AST: 13 U/L (ref 0–37)
BUN: 19 mg/dL (ref 6–23)
CALCIUM: 9.3 mg/dL (ref 8.4–10.5)
CHLORIDE: 111 meq/L (ref 96–112)
CO2: 25 meq/L (ref 19–32)
Creatinine, Ser: 1.85 mg/dL — ABNORMAL HIGH (ref 0.40–1.20)
GFR: 27.2 mL/min — AB (ref >60.00)
Glucose, Bld: 109 mg/dL — ABNORMAL HIGH (ref 70–99)
Potassium: 5 mEq/L (ref 3.5–5.1)
SODIUM: 143 meq/L (ref 135–145)
Total Bilirubin: 0.3 mg/dL (ref 0.2–1.2)
Total Protein: 6.3 g/dL (ref 6.0–8.3)

## 2018-09-24 LAB — LIPID PANEL
CHOL/HDL RATIO: 4
CHOLESTEROL: 144 mg/dL (ref 0–200)
HDL: 41.2 mg/dL (ref >39.00)
NonHDL: 103.11
Triglycerides: 234 mg/dL — ABNORMAL HIGH (ref 0.0–149.0)
VLDL: 46.8 mg/dL — ABNORMAL HIGH (ref 0.0–40.0)

## 2018-09-24 LAB — CBC WITH DIFFERENTIAL/PLATELET
Basophils Absolute: 0 10*3/uL (ref 0.0–0.1)
Basophils Relative: 0.7 % (ref 0.0–3.0)
EOS ABS: 0.2 10*3/uL (ref 0.0–0.7)
Eosinophils Relative: 3.2 % (ref 0.0–5.0)
HCT: 35.6 % — ABNORMAL LOW (ref 36.0–46.0)
Hemoglobin: 11.8 g/dL — ABNORMAL LOW (ref 12.0–15.0)
LYMPHS ABS: 0.9 10*3/uL (ref 0.7–4.0)
Lymphocytes Relative: 12.5 % (ref 12.0–46.0)
MCHC: 33.2 g/dL (ref 30.0–36.0)
MCV: 92.2 fl (ref 78.0–100.0)
Monocytes Absolute: 0.6 10*3/uL (ref 0.1–1.0)
Monocytes Relative: 8.8 % (ref 3.0–12.0)
NEUTROS ABS: 5.3 10*3/uL (ref 1.4–7.7)
Neutrophils Relative %: 74.8 % (ref 43.0–77.0)
PLATELETS: 203 10*3/uL (ref 150.0–400.0)
RBC: 3.86 Mil/uL — ABNORMAL LOW (ref 3.87–5.11)
RDW: 13.7 % (ref 11.5–15.5)
WBC: 7 10*3/uL (ref 4.0–10.5)

## 2018-09-24 LAB — VITAMIN D 25 HYDROXY (VIT D DEFICIENCY, FRACTURES): VITD: 27.27 ng/mL — ABNORMAL LOW (ref 30.00–100.00)

## 2018-09-24 LAB — URINALYSIS, ROUTINE W REFLEX MICROSCOPIC
Bilirubin Urine: NEGATIVE
Ketones, ur: NEGATIVE
Nitrite: NEGATIVE
Specific Gravity, Urine: 1.025 (ref 1.000–1.030)
Total Protein, Urine: 30 — AB
Urine Glucose: NEGATIVE
Urobilinogen, UA: 0.2 (ref 0.0–1.0)
pH: 6 (ref 5.0–8.0)

## 2018-09-24 LAB — LDL CHOLESTEROL, DIRECT: LDL DIRECT: 76 mg/dL

## 2018-09-24 LAB — TSH: TSH: 1.58 u[IU]/mL (ref 0.35–4.50)

## 2018-09-24 LAB — VITAMIN B12: VITAMIN B 12: 856 pg/mL (ref 211–911)

## 2018-09-24 NOTE — Assessment & Plan Note (Signed)
Had a recent UTI and was treated with Cipro but her symptoms are returning. Check UA and Culture.

## 2018-09-24 NOTE — Addendum Note (Signed)
Addended by: Magdalene Molly A on: 09/24/2018 02:27 PM   Modules accepted: Orders

## 2018-09-24 NOTE — Assessment & Plan Note (Signed)
On Levothyroxine, continue to monitor 

## 2018-09-24 NOTE — Progress Notes (Signed)
Subjective:    Patient ID: Jill Washington, female    DOB: Aug 04, 1926, 84 y.o.   MRN: [id number]  No chief complaint on file.   HPI Patient is in today for follow up. She has been feeling well but did have an episode of fecal incontinence here in the office. This is infrequent but does happen, no bloody or tarry stool. No fevers or chills. She was recently treated fro a UTI at [facility] with Ciprofloxacin. At that time she had a fever but none now. Denies CP/palp/SOB/HA/congestion/fevers. Taking meds as prescribed. She does note some suprapubic discomfort   Past Medical History:  Diagnosis Date  . Arthritis   . Blood transfusion   . CHB (complete heart block) ([place]) 11/10/2014  . Chicken pox as a child  . Colon polyps    hyperplastic  . Critical illness polyneuropathy ([place])   . Diarrhea 04/25/2017  . Diverticulosis of sigmoid colon 02/05/2007   mild  . Fecal incontinence   . Fibromyalgia   . GERD (gastroesophageal reflux disease)   . H/O measles   . H/O mumps   . Hiatal hernia   . History of atrial fibrillation   . History of chicken pox   . History of empyema of pleura   . History of gallstones   . History of skin cancer    face  . Hypercholesteremia   . Hypertension   . Hypothyroidism   . IBS (irritable bowel syndrome)   . Measles as a child  . Medicare annual wellness visit, subsequent 05/15/2015   Allergies verified: UTD   Immunization Status: Flu vaccine-- NA  Tdap-- 9-10 years ago per patient  PNA-- 09/30/07 (23)  Shingles-- 04/07/14   A/P:  Changes to [place], [place] or Personal Hx: UTD Pap-- none recent  MMG--declines further MMgs (last 08/12/03 BI-RADS 1: Neg)  Bone Density-- 08/11/03 with Prescott Gum, MD- Normal, declines further testing CCS-- 05/07/12 with Silvano Rusk, MD- polyp removed- no   . Mitral valve prolapse   . Mumps as a child  . Overactive bladder 05/07/2011  . Pacemaker   . Peripheral neuropathy   . Shingles 8 yrs ago  . Sick sinus syndrome ([place])   . Urinary  incontinence   . Venous insufficiency   . Vitamin D deficiency     Past Surgical History:  Procedure Laterality Date  . APPENDECTOMY  1971  . CARDIAC CATHETERIZATION  05/10/1997   Normal coronaries, MVP  . CERVICAL SPINE SURGERY    . CHOLECYSTECTOMY  03/2005   by Dr. Dalbert Batman  . COLONOSCOPY  02/05/2007,02/05/05   Dr. Silvano Rusk  . ESOPHAGOGASTRODUODENOSCOPY  02/05/2005,01/22/90   Dr. Silvano Rusk, Dr. Rachelle Hora  . NM MYOCAR PERF WALL MOTION  08/24/2010   Normal  . PACEMAKER GENERATOR CHANGE N/A 02/25/2012   Procedure: PACEMAKER GENERATOR CHANGE;  Surgeon: Sanda Klein, MD;  Location: [facility];  Service: Cardiovascular;  Laterality: N/A;  . pacemaker implanted  05/11/97,08/02/05   [facility]  . TONSILLECTOMY    . TOTAL ABDOMINAL HYSTERECTOMY W/ BILATERAL SALPINGOOPHORECTOMY      Family History  Problem Relation Age of Onset  . Pancreatic cancer Mother   . Diabetes Mother 62       type 2  . Heart attack Brother   . Diabetes Brother        type 2  . Gallbladder disease Father   . Diabetes Father        type 2  . Diabetes Brother  X 2   . Cancer Sister 80       skin ca on face  . Diabetes Sister        type 2  . Diabetes Daughter        type 2  . Hypertension Daughter   . Alcohol abuse Daughter   . Diabetes Son        type 2  . Diabetes Sister        type 1  . Hypertension Sister   . Stomach cancer Maternal Aunt   . Colon cancer Unknown     Social History   Socioeconomic History  . Marital status: Widowed    Spouse name: Not on file  . Number of children: 3  . Years of education: Not on file  . Highest education level: Not on file  Occupational History  . Occupation: retired    Fish farm manager: RETIRED  Social Needs  . Financial resource strain: Not on file  . Food insecurity:    Worry: Not on file    Inability: Not on file  . Transportation needs:    Medical: Not on file    Non-medical: Not on file  Tobacco Use  . Smoking status: Former Smoker      Packs/day: 2.00    Years: 6.00    Pack years: 12.00    Types: Cigarettes    Last attempt to quit: 10/15/1951    Years since quitting: 66.9  . Smokeless tobacco: Never Used  Substance and Sexual Activity  . Alcohol use: No  . Drug use: No  . Sexual activity: Not on file  Lifestyle  . Physical activity:    Days per week: Not on file    Minutes per session: Not on file  . Stress: Not on file  Relationships  . Social connections:    Talks on phone: Not on file    Gets together: Not on file    Attends religious service: Not on file    Active member of club or organization: Not on file    Attends meetings of clubs or organizations: Not on file    Relationship status: Not on file  . Intimate partner violence:    Fear of current or ex partner: Not on file    Emotionally abused: Not on file    Physically abused: Not on file    Forced sexual activity: Not on file  Other Topics Concern  . Not on file  Social History Narrative   She is a widow, one son to daughters. She is retired.    Outpatient Medications Prior to Visit  Medication Sig Dispense Refill  . albuterol (PROVENTIL HFA;VENTOLIN HFA) 108 (90 Base) MCG/ACT inhaler Inhale 2 puffs into the lungs every 6 (six) hours as needed for wheezing or shortness of breath. 1 Inhaler 1  . atenolol (TENORMIN) 50 MG tablet Take 1.5 tablets (75 mg total) by mouth daily. 135 tablet 3  . cholecalciferol (VITAMIN D) 1000 UNITS tablet Take 1,000 Units by mouth daily.    . furosemide (LASIX) 20 MG tablet TAKE 1 TABLET DAILY 90 tablet 0  . gabapentin (NEURONTIN) 300 MG capsule TAKE 1 CAPSULE TWICE A DAY 180 capsule 4  . LEVOXYL 137 MCG tablet TAKE 1 TABLET DAILY 90 tablet 2  . MYRBETRIQ 50 MG TB24 tablet Take 1 tablet by mouth daily.    Marland Kitchen omeprazole (PRILOSEC) 40 MG capsule TAKE 1 CAPSULE DAILY 90 capsule 4  . PACERONE 200 MG tablet TAKE ONE-HALF (1/2)  TABLET DAILY 45 tablet 3  . polyvinyl alcohol (ARTIFICIAL TEARS) 1.4 % ophthalmic solution  Place 1 drop into both eyes 3 (three) times daily as needed for dry eyes.    . pravastatin (PRAVACHOL) 40 MG tablet Take 1 tablet (40 mg total) by mouth daily. 90 tablet 1  . sodium polystyrene (KAYEXALATE) 15 GM/60ML suspension Take 60 mLs (15 g total) by mouth daily. 60 mL 0  . traMADol (ULTRAM) 50 MG tablet Take 1 tablet (50 mg total) by mouth daily as needed for severe pain. 90 tablet 0  . trimethoprim (TRIMPEX) 100 MG tablet Take 100 mg by mouth daily.    . valsartan-hydrochlorothiazide (DIOVAN-HCT) 80-12.5 MG tablet Take 1 tablet by mouth daily. 90 tablet 3  . warfarin (COUMADIN) 2 MG tablet TAKE 1 TABLET DAILY OR AS DIRECTED 90 tablet 1   No facility-administered medications prior to visit.     Allergies  Allergen Reactions  . Iodinated Diagnostic Agents Other (See Comments)    Pt was not told what the reaction was  . Cymbalta [Duloxetine Hcl] Other (See Comments)    Could not think straight  . Other Itching    Dial Soap    Review of Systems  Constitutional: Positive for malaise/fatigue. Negative for chills and fever.  HENT: Negative for congestion and hearing loss.   Eyes: Negative for discharge.  Respiratory: Negative for cough, sputum production and shortness of breath.   Cardiovascular: Negative for chest pain, palpitations and leg swelling.  Gastrointestinal: Positive for abdominal pain and diarrhea. Negative for blood in stool, constipation, heartburn, nausea and vomiting.  Genitourinary: Negative for dysuria, frequency, hematuria and urgency.  Musculoskeletal: Positive for myalgias. Negative for back pain and falls.  Skin: Negative for rash.  Neurological: Negative for dizziness, sensory change, loss of consciousness, weakness and headaches.  Endo/Heme/Allergies: Negative for environmental allergies. Does not bruise/bleed easily.  Psychiatric/Behavioral: Negative for depression and suicidal ideas. The patient is not nervous/anxious and does not have insomnia.          Objective:    Physical Exam Constitutional:      General: She is not in acute distress.    Appearance: She is well-developed.  HENT:     Head: Normocephalic and atraumatic.  Eyes:     Conjunctiva/sclera: Conjunctivae normal.  Neck:     Musculoskeletal: Neck supple.     Thyroid: No thyromegaly.  Cardiovascular:     Rate and Rhythm: Normal rate and regular rhythm.     Heart sounds: Normal heart sounds. No murmur.  Pulmonary:     Effort: Pulmonary effort is normal. No respiratory distress.     Breath sounds: Normal breath sounds.  Abdominal:     General: Bowel sounds are normal. There is no distension.     Palpations: Abdomen is soft. There is no mass.     Tenderness: There is no abdominal tenderness.  Lymphadenopathy:     Cervical: No cervical adenopathy.  Skin:    General: Skin is warm and dry.  Neurological:     Mental Status: She is alert and oriented to person, place, and time.  Psychiatric:        Behavior: Behavior normal.     BP (!) 132/56 (BP Location: Left Arm, Patient Position: Sitting, Cuff Size: Normal)   Pulse 70   Temp 97.7 F (36.5 C) (Oral)   Resp 18   Ht 5\' 6"  (1.676 m)   Wt 180 lb (81.6 kg)   SpO2 99%   BMI 29.05  kg/m  Wt Readings from Last 3 Encounters:  09/24/18 180 lb (81.6 kg)  05/25/18 182 lb 6.4 oz (82.7 kg)  05/07/18 183 lb (83 kg)     Lab Results  Component Value Date   WBC 8.5 01/20/2018   HGB 11.8 (L) 01/20/2018   HCT 34.6 (L) 01/20/2018   PLT 205.0 01/20/2018   GLUCOSE 83 01/20/2018   CHOL 147 09/15/2017   TRIG 140.0 09/15/2017   HDL 42.90 09/15/2017   LDLDIRECT 77.0 04/28/2015   LDLCALC 76 09/15/2017   ALT 9 01/20/2018   AST 16 01/20/2018   NA 138 01/20/2018   K 5.8 (H) 01/20/2018   CL 107 01/20/2018   CREATININE 2.42 (H) 01/20/2018   BUN 17 01/20/2018   CO2 25 01/20/2018   TSH 2.670 05/07/2018   INR 3.0 08/19/2018   HGBA1C 5.3 08/02/2015    Lab Results  Component Value Date   TSH 2.670 05/07/2018   Lab  Results  Component Value Date   WBC 8.5 01/20/2018   HGB 11.8 (L) 01/20/2018   HCT 34.6 (L) 01/20/2018   MCV 91.9 01/20/2018   PLT 205.0 01/20/2018   Lab Results  Component Value Date   NA 138 01/20/2018   K 5.8 (H) 01/20/2018   CO2 25 01/20/2018   GLUCOSE 83 01/20/2018   BUN 17 01/20/2018   CREATININE 2.42 (H) 01/20/2018   BILITOT 0.3 01/20/2018   ALKPHOS 60 01/20/2018   AST 16 01/20/2018   ALT 9 01/20/2018   PROT 6.6 01/20/2018   ALBUMIN 3.7 01/20/2018   CALCIUM 9.3 01/20/2018   ANIONGAP 14 08/09/2015   GFR 19.98 (L) 01/20/2018   Lab Results  Component Value Date   CHOL 147 09/15/2017   Lab Results  Component Value Date   HDL 42.90 09/15/2017   Lab Results  Component Value Date   LDLCALC 76 09/15/2017   Lab Results  Component Value Date   TRIG 140.0 09/15/2017   Lab Results  Component Value Date   CHOLHDL 3 09/15/2017   Lab Results  Component Value Date   HGBA1C 5.3 08/02/2015       Assessment & Plan:   Problem List Items Addressed This Visit    Hypothyroidism    On Levothyroxine, continue to monitor      Relevant Orders   TSH   Vitamin D deficiency    Supplement and monitor      Relevant Orders   VITAMIN D 25 Hydroxy (Vit-D Deficiency, Fractures)   Hypercholesterolemia    Tolerating statin, encouraged heart healthy diet, avoid trans fats, minimize simple carbs and saturated fats. Increase exercise as tolerated      Relevant Orders   Lipid panel   Essential hypertension - Primary    Well controlled, no changes to meds. Encouraged heart healthy diet such as the DASH diet and exercise as tolerated.       Atrial fibrillation ([place])    Rate controlled      B12 deficiency    Supplement and monitor      Relevant Orders   Vitamin B12   CHB (complete heart block) (HCC)    Doing well given flu shot today      Renal insufficiency    Monitor and hydrate      Relevant Orders   Comprehensive metabolic panel   Urinary frequency     Had a recent UTI and was treated with Cipro but her symptoms are returning. Check UA and Culture.  Relevant Orders   CBC with Differential/Platelet   Comprehensive metabolic panel      I am having Jill Washington maintain her cholecalciferol, polyvinyl alcohol, trimethoprim, MYRBETRIQ, warfarin, PACERONE, furosemide, traMADol, albuterol, sodium polystyrene, valsartan-hydrochlorothiazide, LEVOXYL, atenolol, omeprazole, pravastatin, and gabapentin.  No orders of the defined types were placed in this encounter.    [name], MD

## 2018-09-24 NOTE — Assessment & Plan Note (Signed)
Monitor and hydrate

## 2018-09-24 NOTE — Assessment & Plan Note (Signed)
Tolerating statin, encouraged heart healthy diet, avoid trans fats, minimize simple carbs and saturated fats. Increase exercise as tolerated 

## 2018-09-24 NOTE — Patient Instructions (Signed)

## 2018-09-24 NOTE — Assessment & Plan Note (Signed)
Supplement and monitor 

## 2018-09-24 NOTE — Assessment & Plan Note (Signed)
Well controlled, no changes to meds. Encouraged heart healthy diet such as the DASH diet and exercise as tolerated.  °

## 2018-09-24 NOTE — Assessment & Plan Note (Signed)
Rate controlled 

## 2018-09-24 NOTE — Assessment & Plan Note (Signed)
Doing well given flu shot today

## 2018-09-25 LAB — URINE CULTURE
MICRO NUMBER:: 91489747
Result:: NO GROWTH
SPECIMEN QUALITY:: ADEQUATE

## 2018-09-25 MED ORDER — CEFDINIR 300 MG PO CAPS: 300.0000 mg | ORAL_CAPSULE | Freq: Two times a day (BID) | ORAL | 0 refills | Status: DC

## 2018-09-25 NOTE — Addendum Note (Signed)
Addended by: Magdalene Molly A on: 09/25/2018 04:32 PM   Modules accepted: Orders

## 2018-09-30 ENCOUNTER — Ambulatory Visit (INDEPENDENT_AMBULATORY_CARE_PROVIDER_SITE_OTHER): Payer: Medicare Other | Admitting: Pharmacist

## 2018-09-30 DIAGNOSIS — Z7901 Long term (current) use of anticoagulants: Secondary | ICD-10-CM

## 2018-09-30 DIAGNOSIS — I480 Paroxysmal atrial fibrillation: Secondary | ICD-10-CM

## 2018-09-30 LAB — POCT INR: INR: 4.5 — AB (ref 2.0–3.0)

## 2018-10-12 ENCOUNTER — Ambulatory Visit (INDEPENDENT_AMBULATORY_CARE_PROVIDER_SITE_OTHER): Payer: Medicare Other

## 2018-10-12 DIAGNOSIS — I480 Paroxysmal atrial fibrillation: Secondary | ICD-10-CM

## 2018-10-12 DIAGNOSIS — I495 Sick sinus syndrome: Secondary | ICD-10-CM

## 2018-10-12 NOTE — Progress Notes (Signed)
Remote pacemaker transmission.   

## 2018-10-13 LAB — CUP PACEART REMOTE DEVICE CHECK
Battery Remaining Percentage: 35 %
Battery Voltage: 2.83 V
Brady Statistic AP VP Percent: 98 %
Brady Statistic AP VS Percent: 1 %
Brady Statistic AS VP Percent: 1.8 %
Brady Statistic AS VS Percent: 1 %
Brady Statistic RA Percent Paced: 97 %
Date Time Interrogation Session: 20191230070008
Implantable Lead Implant Date: 19980729
Implantable Lead Implant Date: 19980729
Implantable Lead Location: 753859
Implantable Lead Location: 753860
Implantable Pulse Generator Implant Date: 20130514
Lead Channel Impedance Value: 400 Ohm
Lead Channel Impedance Value: 440 Ohm
Lead Channel Pacing Threshold Amplitude: 1.125 V
Lead Channel Pacing Threshold Pulse Width: 0.4 ms
Lead Channel Pacing Threshold Pulse Width: 0.4 ms
Lead Channel Sensing Intrinsic Amplitude: 12 mV
Lead Channel Sensing Intrinsic Amplitude: 2.2 mV
Lead Channel Setting Pacing Amplitude: 1.375
Lead Channel Setting Pacing Amplitude: 1.375
Lead Channel Setting Pacing Pulse Width: 0.4 ms
Lead Channel Setting Sensing Sensitivity: 4 mV
MDC IDC MSMT BATTERY REMAINING LONGEVITY: 40 mo
MDC IDC MSMT LEADCHNL RA PACING THRESHOLD AMPLITUDE: 0.375 V
MDC IDC STAT BRADY RV PERCENT PACED: 99 %
Pulse Gen Model: 2210
Pulse Gen Serial Number: 7338831

## 2018-10-30 ENCOUNTER — Ambulatory Visit (INDEPENDENT_AMBULATORY_CARE_PROVIDER_SITE_OTHER): Payer: Medicare Other | Admitting: *Deleted

## 2018-10-30 DIAGNOSIS — I480 Paroxysmal atrial fibrillation: Secondary | ICD-10-CM | POA: Diagnosis not present

## 2018-10-30 DIAGNOSIS — Z7901 Long term (current) use of anticoagulants: Secondary | ICD-10-CM

## 2018-10-30 LAB — POCT INR: INR: 3.1 — AB (ref 2.0–3.0)

## 2018-10-30 NOTE — Patient Instructions (Signed)
Description   Skip Coumadin tomorrow, then resume 1/2 tablet everyday. Start eating 2 servings of leafy green vegetable each week.   Repeat INR in 2 weeks

## 2018-11-13 ENCOUNTER — Ambulatory Visit (INDEPENDENT_AMBULATORY_CARE_PROVIDER_SITE_OTHER): Payer: Medicare Other | Admitting: *Deleted

## 2018-11-13 DIAGNOSIS — Z7901 Long term (current) use of anticoagulants: Secondary | ICD-10-CM | POA: Diagnosis not present

## 2018-11-13 DIAGNOSIS — I480 Paroxysmal atrial fibrillation: Secondary | ICD-10-CM

## 2018-11-13 LAB — POCT INR: INR: 2.9 (ref 2.0–3.0)

## 2018-11-13 NOTE — Patient Instructions (Signed)
Description   Continue taking  1/2 tablet everyday. Continue eating 3 servings of leafy green vegetable each week.   Repeat INR in 3 weeks

## 2018-11-18 DIAGNOSIS — N3020 Other chronic cystitis without hematuria: Secondary | ICD-10-CM | POA: Diagnosis not present

## 2018-11-18 DIAGNOSIS — N3946 Mixed incontinence: Secondary | ICD-10-CM | POA: Diagnosis not present

## 2018-12-01 ENCOUNTER — Ambulatory Visit (INDEPENDENT_AMBULATORY_CARE_PROVIDER_SITE_OTHER): Payer: Medicare Other | Admitting: Pharmacist Clinician (PhC)/ Clinical Pharmacy Specialist

## 2018-12-01 DIAGNOSIS — Z7901 Long term (current) use of anticoagulants: Secondary | ICD-10-CM | POA: Diagnosis not present

## 2018-12-01 DIAGNOSIS — I480 Paroxysmal atrial fibrillation: Secondary | ICD-10-CM | POA: Diagnosis not present

## 2018-12-01 LAB — POCT INR: INR: 2.7 (ref 2.0–3.0)

## 2019-01-11 ENCOUNTER — Ambulatory Visit (INDEPENDENT_AMBULATORY_CARE_PROVIDER_SITE_OTHER): Payer: Medicare Other | Admitting: *Deleted

## 2019-01-11 ENCOUNTER — Other Ambulatory Visit: Payer: Self-pay

## 2019-01-11 DIAGNOSIS — I480 Paroxysmal atrial fibrillation: Secondary | ICD-10-CM | POA: Diagnosis not present

## 2019-01-11 DIAGNOSIS — I442 Atrioventricular block, complete: Secondary | ICD-10-CM

## 2019-01-11 LAB — CUP PACEART REMOTE DEVICE CHECK
Battery Remaining Longevity: 32 mo
Battery Remaining Percentage: 29 %
Battery Voltage: 2.81 V
Brady Statistic AP VP Percent: 98 %
Brady Statistic AP VS Percent: 1 %
Brady Statistic AS VP Percent: 1.7 %
Brady Statistic AS VS Percent: 1 %
Brady Statistic RA Percent Paced: 97 %
Brady Statistic RV Percent Paced: 99 %
Date Time Interrogation Session: 20200330071840
Implantable Lead Implant Date: 19980729
Implantable Lead Implant Date: 19980729
Implantable Lead Location: 753859
Implantable Lead Location: 753860
Implantable Pulse Generator Implant Date: 20130514
Lead Channel Impedance Value: 380 Ohm
Lead Channel Impedance Value: 440 Ohm
Lead Channel Pacing Threshold Amplitude: 0.375 V
Lead Channel Pacing Threshold Amplitude: 1 V
Lead Channel Pacing Threshold Pulse Width: 0.4 ms
Lead Channel Sensing Intrinsic Amplitude: 12 mV
Lead Channel Sensing Intrinsic Amplitude: 2 mV
Lead Channel Setting Pacing Amplitude: 1.25 V
Lead Channel Setting Pacing Amplitude: 1.375
Lead Channel Setting Pacing Pulse Width: 0.4 ms
Lead Channel Setting Sensing Sensitivity: 4 mV
MDC IDC MSMT LEADCHNL RV PACING THRESHOLD PULSEWIDTH: 0.4 ms
MDC IDC PG SERIAL: 7338831

## 2019-01-14 ENCOUNTER — Telehealth: Payer: Self-pay

## 2019-01-14 NOTE — Telephone Encounter (Signed)
Called to screen but the pt requested to change the date and was made aware of the drive thru clinic

## 2019-01-18 NOTE — Progress Notes (Signed)
Remote pacemaker transmission.   

## 2019-01-27 ENCOUNTER — Other Ambulatory Visit: Payer: Self-pay | Admitting: Family Medicine

## 2019-01-27 NOTE — Progress Notes (Deleted)
Virtual Visit via Video Note  I connected with patient on 01/28/19 at  2:00 PM EDT by a video enabled telemedicine application and verified that I am speaking with the correct person using two identifiers.   THIS ENCOUNTER IS A VIRTUAL VISIT DUE TO COVID-19 - PATIENT WAS NOT SEEN IN THE OFFICE. PATIENT HAS CONSENTED TO VIRTUAL VISIT / TELEMEDICINE VISIT   Location of patient: home  Location of provider: office  I discussed the limitations of evaluation and management by telemedicine and the availability of in person appointments. The patient expressed understanding and agreed to proceed.   Subjective:   Jill Washington is a 85 y.o. female who presents for Medicare Annual (Subsequent) preventive examination.  Review of Systems: No ROS.  Medicare Wellness Visit. Additional risk factors are reflected in the social history.   Sleep patterns:  Home Safety/Smoke Alarms: Feels safe in home. Smoke alarms in place.     Female:       Mammo- declines      Dexa scan-           Objective:     Vitals: Unable to assess. This visit is enabled though telemedicine due to Covid 19.   Advanced Directives 09/15/2017 01/02/2017 04/29/2016 08/01/2015 05/15/2015 02/25/2012  Does Patient Have a Medical Advance Directive? No No No No No Patient does not have advance directive  Would patient like information on creating a medical advance directive? No - Patient declined - Yes - Scientist, clinical (histocompatibility and immunogenetics) given No - patient declined information Yes Higher education careers adviser given -    Tobacco Social History   Tobacco Use  Smoking Status Former Smoker  . Packs/day: 2.00  . Years: 6.00  . Pack years: 12.00  . Types: Cigarettes  . Last attempt to quit: 10/15/1951  . Years since quitting: 67.3  Smokeless Tobacco Never Used     Counseling given: Not Answered   Clinical Intake:                       Past Medical History:  Diagnosis Date  . Arthritis   . Blood transfusion   . CHB  (complete heart block) ([place]) 11/10/2014  . Chicken pox as a child  . Colon polyps    hyperplastic  . Critical illness polyneuropathy ([place])   . Diarrhea 04/25/2017  . Diverticulosis of sigmoid colon 02/05/2007   mild  . Fecal incontinence   . Fibromyalgia   . GERD (gastroesophageal reflux disease)   . H/O measles   . H/O mumps   . Hiatal hernia   . History of atrial fibrillation   . History of chicken pox   . History of empyema of pleura   . History of gallstones   . History of skin cancer    face  . Hypercholesteremia   . Hypertension   . Hypothyroidism   . IBS (irritable bowel syndrome)   . Measles as a child  . Medicare annual wellness visit, subsequent 05/15/2015   Allergies verified: UTD   Immunization Status: Flu vaccine-- NA  Tdap-- 9-10 years ago per patient  PNA-- 09/30/07 (23)  Shingles-- 04/07/14   A/P:  Changes to [place], [place] or Personal Hx: UTD Pap-- none recent  MMG--declines further MMgs (last 08/12/03 BI-RADS 1: Neg)  Bone Density-- 08/11/03 with Prescott Gum, MD- Normal, declines further testing CCS-- 05/07/12 with Silvano Rusk, MD- polyp removed- no   . Mitral valve prolapse   . Mumps as a child  . Overactive  bladder 05/07/2011  . Pacemaker   . Peripheral neuropathy   . Shingles 8 yrs ago  . Sick sinus syndrome ([place])   . Urinary incontinence   . Venous insufficiency   . Vitamin D deficiency    Past Surgical History:  Procedure Laterality Date  . APPENDECTOMY  1971  . CARDIAC CATHETERIZATION  05/10/1997   Normal coronaries, MVP  . CERVICAL SPINE SURGERY    . CHOLECYSTECTOMY  03/2005   by Dr. Dalbert Batman  . COLONOSCOPY  02/05/2007,02/05/05   Dr. Silvano Rusk  . ESOPHAGOGASTRODUODENOSCOPY  02/05/2005,01/22/90   Dr. Silvano Rusk, Dr. Rachelle Hora  . NM MYOCAR PERF WALL MOTION  08/24/2010   Normal  . PACEMAKER GENERATOR CHANGE N/A 02/25/2012   Procedure: PACEMAKER GENERATOR CHANGE;  Surgeon: Sanda Klein, MD;  Location: [facility]ATH [facility];  Service: Cardiovascular;  Laterality:  N/A;  . pacemaker implanted  05/11/97,08/02/05   [facility]  . TONSILLECTOMY    . TOTAL ABDOMINAL HYSTERECTOMY W/ BILATERAL SALPINGOOPHORECTOMY     Family History  Problem Relation Age of Onset  . Pancreatic cancer Mother   . Diabetes Mother 52       type 2  . Heart attack Brother   . Diabetes Brother        type 2  . Gallbladder disease Father   . Diabetes Father        type 2  . Diabetes Brother        X 2   . Cancer Sister 80       skin ca on face  . Diabetes Sister        type 2  . Diabetes Daughter        type 2  . Hypertension Daughter   . Alcohol abuse Daughter   . Diabetes Son        type 2  . Diabetes Sister        type 1  . Hypertension Sister   . Stomach cancer Maternal Aunt   . Colon cancer Unknown    Social History   Socioeconomic History  . Marital status: Widowed    Spouse name: Not on file  . Number of children: 3  . Years of education: Not on file  . Highest education level: Not on file  Occupational History  . Occupation: retired    Fish farm manager: RETIRED  Social Needs  . Financial resource strain: Not on file  . Food insecurity:    Worry: Not on file    Inability: Not on file  . Transportation needs:    Medical: Not on file    Non-medical: Not on file  Tobacco Use  . Smoking status: Former Smoker    Packs/day: 2.00    Years: 6.00    Pack years: 12.00    Types: Cigarettes    Last attempt to quit: 10/15/1951    Years since quitting: 67.3  . Smokeless tobacco: Never Used  Substance and Sexual Activity  . Alcohol use: No  . Drug use: No  . Sexual activity: Not on file  Lifestyle  . Physical activity:    Days per week: Not on file    Minutes per session: Not on file  . Stress: Not on file  Relationships  . Social connections:    Talks on phone: Not on file    Gets together: Not on file    Attends religious service: Not on file    Active member of club or organization: Not on file  Attends meetings of clubs or organizations: Not on  file    Relationship status: Not on file  Other Topics Concern  . Not on file  Social History Narrative   She is a widow, one son to daughters. She is retired.    Outpatient Encounter Medications as of 01/28/2019  Medication Sig  . albuterol (PROVENTIL HFA;VENTOLIN HFA) 108 (90 Base) MCG/ACT inhaler Inhale 2 puffs into the lungs every 6 (six) hours as needed for wheezing or shortness of breath.  Marland Kitchen atenolol (TENORMIN) 50 MG tablet Take 1.5 tablets (75 mg total) by mouth daily.  . cefdinir (OMNICEF) 300 MG capsule Take 1 capsule (300 mg total) by mouth 2 (two) times daily.  . cholecalciferol (VITAMIN D) 1000 UNITS tablet Take 1,000 Units by mouth daily.  . furosemide (LASIX) 20 MG tablet TAKE 1 TABLET DAILY  . gabapentin (NEURONTIN) 300 MG capsule TAKE 1 CAPSULE TWICE A DAY  . LEVOXYL 137 MCG tablet TAKE 1 TABLET DAILY  . MYRBETRIQ 50 MG TB24 tablet Take 1 tablet by mouth daily.  Marland Kitchen omeprazole (PRILOSEC) 40 MG capsule TAKE 1 CAPSULE DAILY  . PACERONE 200 MG tablet TAKE ONE-HALF (1/2) TABLET DAILY  . polyvinyl alcohol (ARTIFICIAL TEARS) 1.4 % ophthalmic solution Place 1 drop into both eyes 3 (three) times daily as needed for dry eyes.  . pravastatin (PRAVACHOL) 40 MG tablet Take 1 tablet (40 mg total) by mouth daily.  . sodium polystyrene (KAYEXALATE) 15 GM/60ML suspension Take 60 mLs (15 g total) by mouth daily.  . traMADol (ULTRAM) 50 MG tablet Take 1 tablet (50 mg total) by mouth daily as needed for severe pain.  Marland Kitchen trimethoprim (TRIMPEX) 100 MG tablet Take 100 mg by mouth daily.  . valsartan-hydrochlorothiazide (DIOVAN-HCT) 80-12.5 MG tablet Take 1 tablet by mouth daily.  Marland Kitchen warfarin (COUMADIN) 2 MG tablet TAKE 1 TABLET DAILY OR AS DIRECTED   No facility-administered encounter medications on file as of 01/28/2019.     Activities of Daily Living No flowsheet data found.  Patient Care Team: Mosie Lukes, MD as PCP - General (Family Medicine) Croitoru, Dani Gobble, MD as Consulting  Physician (Cardiology) Lynnell Dike, OD as Consulting Physician (Optometry)    Assessment:   This is a routine wellness examination for [place]. Physical assessment deferred to PCP.  Exercise Activities and Dietary recommendations   Diet (meal preparation, eat out, water intake, caffeinated beverages, dairy products, fruits and vegetables): {Desc; diets:16563} Breakfast: Lunch:  Dinner:      Goals    . Maintain Independence    . Patient Stated     Decrease knee pain and get back to regular activities.       Fall Risk Fall Risk  09/15/2017 04/29/2016 04/28/2015 08/11/2013  Falls in the past year? No Yes Yes No  Number falls in past yr: - 2 or more 2 or more -  Injury with Fall? - Yes No -  Risk Factor Category  - High Fall Risk - -  Risk for fall due to : - History of fall(s);Impaired balance/gait - -  Risk for fall due to: Comment - Pt has rolling walker, but does not use because it is too heavy for her to lift in and out of her car and too big for use in her house. Encouraged PT eval, pt will consider and let Dr. Charlett Blake know at follow-up appt next week. - -  Follow up - Education provided;Falls prevention discussed;Follow up appointment - -    Depression Screen PHQ  2/9 Scores 09/15/2017 04/29/2016 04/28/2015 02/09/2013  PHQ - 2 Score 0 1 0 0     Cognitive Function MMSE - Mini Mental State Exam 09/15/2017 04/29/2016  Orientation to time 5 5  Orientation to Place 5 5  Registration 3 3  Attention/ Calculation 5 5  Recall 3 3  Language- name 2 objects 2 2  Language- repeat 1 1  Language- follow 3 step command 3 3  Language- read & follow direction 1 1  Write a sentence 1 1  Copy design 1 1  Total score 30 30        Immunization History  Administered Date(s) Administered  . Influenza Split 07/17/2011, 07/31/2012, 07/28/2013  . Influenza Whole 10/17/2009, 08/27/2010  . Influenza, High Dose Seasonal PF 09/24/2018  . Influenza,inj,Quad PF,6+ Mos 08/08/2015  .  Influenza-Unspecified 07/04/2016  . Pneumococcal Conjugate-13 10/30/2015  . Pneumococcal Polysaccharide-23 09/30/2007  . Tdap 10/30/2015  . Zoster 04/07/2014   Screening Tests Health Maintenance  Topic Date Due  . INFLUENZA VACCINE  05/15/2019  . TETANUS/TDAP  10/29/2025  . DEXA SCAN  Completed  . PNA vac Low Risk Adult  Completed      Plan:   ***   I have personally reviewed and noted the following in the patient's chart:   . Medical and social history . Use of alcohol, tobacco or illicit drugs  . Current medications and supplements . Functional ability and status . Nutritional status . Physical activity . Advanced directives . List of other physicians . Hospitalizations, surgeries, and ER visits in previous 12 months . Vitals . Screenings to include cognitive, depression, and falls . Referrals and appointments  In addition, I have reviewed and discussed with patient certain preventive protocols, quality metrics, and best practice recommendations. A written personalized care plan for preventive services as well as general preventive health recommendations were provided to patient.     Shela Nevin, [place]  01/27/2019

## 2019-01-28 ENCOUNTER — Ambulatory Visit: Payer: Medicare Other | Admitting: Family Medicine

## 2019-01-28 ENCOUNTER — Other Ambulatory Visit: Payer: Self-pay

## 2019-01-28 ENCOUNTER — Ambulatory Visit: Payer: Medicare Other | Admitting: *Deleted

## 2019-01-28 DIAGNOSIS — I10 Essential (primary) hypertension: Secondary | ICD-10-CM

## 2019-01-31 NOTE — Progress Notes (Signed)
Patient did not answer the phone no visit occured

## 2019-02-02 ENCOUNTER — Telehealth: Payer: Self-pay

## 2019-02-02 NOTE — Telephone Encounter (Signed)

## 2019-02-02 NOTE — Telephone Encounter (Signed)
Follow Up:      Returning your call from today. 

## 2019-02-02 NOTE — Telephone Encounter (Signed)
lmom for prescreen  

## 2019-02-04 ENCOUNTER — Ambulatory Visit (INDEPENDENT_AMBULATORY_CARE_PROVIDER_SITE_OTHER): Payer: Medicare Other | Admitting: Pharmacist

## 2019-02-04 ENCOUNTER — Other Ambulatory Visit: Payer: Self-pay

## 2019-02-04 ENCOUNTER — Telehealth: Payer: Self-pay | Admitting: Pharmacist

## 2019-02-04 DIAGNOSIS — Z7901 Long term (current) use of anticoagulants: Secondary | ICD-10-CM

## 2019-02-04 DIAGNOSIS — I480 Paroxysmal atrial fibrillation: Secondary | ICD-10-CM

## 2019-02-04 LAB — POCT INR: INR: 3.1 — AB (ref 2.0–3.0)

## 2019-02-04 MED ORDER — APIXABAN 2.5 MG PO TABS: 2.5000 mg | ORAL_TABLET | Freq: Two times a day (BID) | ORAL | 5 refills | Status: DC

## 2019-02-04 MED ORDER — APIXABAN 2.5 MG PO TABS: 2.5000 mg | ORAL_TABLET | Freq: Two times a day (BID) | ORAL | 1 refills | Status: DC

## 2019-02-04 NOTE — Telephone Encounter (Signed)
New Message ° ° ° °Pt is returning call  ° ° ° °Please call back  °

## 2019-02-08 ENCOUNTER — Other Ambulatory Visit: Payer: Self-pay

## 2019-02-08 ENCOUNTER — Ambulatory Visit (INDEPENDENT_AMBULATORY_CARE_PROVIDER_SITE_OTHER): Payer: Medicare Other | Admitting: Family Medicine

## 2019-02-08 DIAGNOSIS — G8929 Other chronic pain: Secondary | ICD-10-CM

## 2019-02-08 DIAGNOSIS — Z7901 Long term (current) use of anticoagulants: Secondary | ICD-10-CM

## 2019-02-08 DIAGNOSIS — M5441 Lumbago with sciatica, right side: Secondary | ICD-10-CM | POA: Diagnosis not present

## 2019-02-08 DIAGNOSIS — I10 Essential (primary) hypertension: Secondary | ICD-10-CM

## 2019-02-08 DIAGNOSIS — N289 Disorder of kidney and ureter, unspecified: Secondary | ICD-10-CM | POA: Diagnosis not present

## 2019-02-08 MED ORDER — OLMESARTAN MEDOXOMIL-HCTZ 20-12.5 MG PO TABS: 1.0000 | ORAL_TABLET | Freq: Every day | ORAL | 1 refills | Status: DC

## 2019-02-08 NOTE — Assessment & Plan Note (Signed)
Hydrate and monitor 

## 2019-02-08 NOTE — Assessment & Plan Note (Signed)
Cardiology has changed her from Coumadin to Eliquis since it is hard for her to drive now. She is tolerating it well and the [employer] is covering.

## 2019-02-08 NOTE — Assessment & Plan Note (Addendum)
Her insurance is asking her to change from [place]. Will start Olmesartanhct 20/12.5 daily. Encouraged heart healthy diet such as the DASH diet and exercise as tolerated. F/u 4 weeks she will get a home BP cuff

## 2019-02-08 NOTE — Assessment & Plan Note (Signed)
Encouraged moist heat and gentle stretching as tolerated. May try NSAIDs and prescription meds as directed and report if symptoms worsen or seek immediate care. Pain is at baseline and does not keep her from doing what she needs to do each day.

## 2019-02-08 NOTE — Progress Notes (Signed)
Virtual Visit via Telephone Note  I connected with Jill Washington on 02/08/19 at  2:20 PM EDT by a telephone enabled telemedicine application and verified that I am speaking with the correct person using two identifiers.   I discussed the limitations of evaluation and management by telemedicine and the availability of in person appointments. The patient expressed understanding and agreed to proceed. Jill Washington, CMA attempted to get patient into a video visit but patient was unable to complete to a phone call was completed    Subjective:    Patient ID: Jill Washington, female    DOB: 01-Jun-1926, 85 y.o.   MRN: [id number]  No chief complaint on file.   HPI Patient is in today for follow up on chronic medical concerns including hypertension, chronic renal insufficiency, chronic back pain and more. She is managing her quarantine well. Her family is helping her with her food and deliveries. Other than boredom she is doing well. No recent febrile illness or hospitalizations. Denies CP/palp/SOB/HA/congestion/fevers/GI or GU c/o. Taking meds as prescribed  Past Medical History:  Diagnosis Date  . Arthritis   . Blood transfusion   . CHB (complete heart block) ([place]) 11/10/2014  . Chicken pox as a child  . Colon polyps    hyperplastic  . Critical illness polyneuropathy ([place])   . Diarrhea 04/25/2017  . Diverticulosis of sigmoid colon 02/05/2007   mild  . Fecal incontinence   . Fibromyalgia   . GERD (gastroesophageal reflux disease)   . H/O measles   . H/O mumps   . Hiatal hernia   . History of atrial fibrillation   . History of chicken pox   . History of empyema of pleura   . History of gallstones   . History of skin cancer    face  . Hypercholesteremia   . Hypertension   . Hypothyroidism   . IBS (irritable bowel syndrome)   . Measles as a child  . Medicare annual wellness visit, subsequent 05/15/2015   Allergies verified: UTD   Immunization Status: Flu vaccine-- NA  Tdap--  9-10 years ago per patient  PNA-- 09/30/07 (23)  Shingles-- 04/07/14   A/P:  Changes to [place], [place] or Personal Hx: UTD Pap-- none recent  MMG--declines further MMgs (last 08/12/03 BI-RADS 1: Neg)  Bone Density-- 08/11/03 with Prescott Gum, MD- Normal, declines further testing CCS-- 05/07/12 with Silvano Rusk, MD- polyp removed- no   . Mitral valve prolapse   . Mumps as a child  . Overactive bladder 05/07/2011  . Pacemaker   . Peripheral neuropathy   . Shingles 8 yrs ago  . Sick sinus syndrome ([place])   . Urinary incontinence   . Venous insufficiency   . Vitamin D deficiency     Past Surgical History:  Procedure Laterality Date  . APPENDECTOMY  1971  . CARDIAC CATHETERIZATION  05/10/1997   Normal coronaries, MVP  . CERVICAL SPINE SURGERY    . CHOLECYSTECTOMY  03/2005   by Dr. Dalbert Batman  . COLONOSCOPY  02/05/2007,02/05/05   Dr. Silvano Rusk  . ESOPHAGOGASTRODUODENOSCOPY  02/05/2005,01/22/90   Dr. Silvano Rusk, Dr. Rachelle Hora  . NM MYOCAR PERF WALL MOTION  08/24/2010   Normal  . PACEMAKER GENERATOR CHANGE N/A 02/25/2012   Procedure: PACEMAKER GENERATOR CHANGE;  Surgeon: Sanda Klein, MD;  Location: [facility]ATH [facility];  Service: Cardiovascular;  Laterality: N/A;  . pacemaker implanted  05/11/97,08/02/05   [facility]  . TONSILLECTOMY    . TOTAL ABDOMINAL HYSTERECTOMY W/ BILATERAL SALPINGOOPHORECTOMY  Family History  Problem Relation Age of Onset  . Pancreatic cancer Mother   . Diabetes Mother 68       type 2  . Heart attack Brother   . Diabetes Brother        type 2  . Gallbladder disease Father   . Diabetes Father        type 2  . Diabetes Brother        X 2   . Cancer Sister 80       skin ca on face  . Diabetes Sister        type 2  . Diabetes Daughter        type 2  . Hypertension Daughter   . Alcohol abuse Daughter   . Diabetes Son        type 2  . Diabetes Sister        type 1  . Hypertension Sister   . Stomach cancer Maternal Aunt   . Colon cancer Unknown     Social  History   Socioeconomic History  . Marital status: Widowed    Spouse name: Not on file  . Number of children: 3  . Years of education: Not on file  . Highest education level: Not on file  Occupational History  . Occupation: retired    Fish farm manager: RETIRED  Social Needs  . Financial resource strain: Not on file  . Food insecurity:    Worry: Not on file    Inability: Not on file  . Transportation needs:    Medical: Not on file    Non-medical: Not on file  Tobacco Use  . Smoking status: Former Smoker    Packs/day: 2.00    Years: 6.00    Pack years: 12.00    Types: Cigarettes    Last attempt to quit: 10/15/1951    Years since quitting: 67.3  . Smokeless tobacco: Never Used  Substance and Sexual Activity  . Alcohol use: No  . Drug use: No  . Sexual activity: Not on file  Lifestyle  . Physical activity:    Days per week: Not on file    Minutes per session: Not on file  . Stress: Not on file  Relationships  . Social connections:    Talks on phone: Not on file    Gets together: Not on file    Attends religious service: Not on file    Active member of club or organization: Not on file    Attends meetings of clubs or organizations: Not on file    Relationship status: Not on file  . Intimate partner violence:    Fear of current or ex partner: Not on file    Emotionally abused: Not on file    Physically abused: Not on file    Forced sexual activity: Not on file  Other Topics Concern  . Not on file  Social History Narrative   She is a widow, one son to daughters. She is retired.    Outpatient Medications Prior to Visit  Medication Sig Dispense Refill  . albuterol (PROVENTIL HFA;VENTOLIN HFA) 108 (90 Base) MCG/ACT inhaler Inhale 2 puffs into the lungs every 6 (six) hours as needed for wheezing or shortness of breath. 1 Inhaler 1  . apixaban (ELIQUIS) 2.5 MG TABS tablet Take 1 tablet (2.5 mg total) by mouth 2 (two) times daily. 180 tablet 1  . atenolol (TENORMIN) 50 MG tablet  Take 1.5 tablets (75 mg total) by mouth daily. [place]  tablet 3  . cholecalciferol (VITAMIN D) 1000 UNITS tablet Take 1,000 Units by mouth daily.    . furosemide (LASIX) 20 MG tablet TAKE 1 TABLET DAILY 90 tablet 0  . gabapentin (NEURONTIN) 300 MG capsule TAKE 1 CAPSULE TWICE A DAY 180 capsule 4  . levothyroxine (SYNTHROID, LEVOTHROID) 137 MCG tablet TAKE 1 TABLET DAILY 90 tablet 3  . MYRBETRIQ 50 MG TB24 tablet Take 1 tablet by mouth daily.    Marland Kitchen omeprazole (PRILOSEC) 40 MG capsule TAKE 1 CAPSULE DAILY 90 capsule 4  . PACERONE 200 MG tablet TAKE ONE-HALF (1/2) TABLET DAILY 45 tablet 3  . polyvinyl alcohol (ARTIFICIAL TEARS) 1.4 % ophthalmic solution Place 1 drop into both eyes 3 (three) times daily as needed for dry eyes.    . pravastatin (PRAVACHOL) 40 MG tablet TAKE 1 TABLET DAILY 90 tablet 3  . sodium polystyrene (KAYEXALATE) 15 GM/60ML suspension Take 60 mLs (15 g total) by mouth daily. 60 mL 0  . traMADol (ULTRAM) 50 MG tablet Take 1 tablet (50 mg total) by mouth daily as needed for severe pain. 90 tablet 0  . trimethoprim (TRIMPEX) 100 MG tablet Take 100 mg by mouth daily.    . cefdinir (OMNICEF) 300 MG capsule Take 1 capsule (300 mg total) by mouth 2 (two) times daily. 14 capsule 0  . valsartan-hydrochlorothiazide (DIOVAN-HCT) 80-12.5 MG tablet Take 1 tablet by mouth daily. 90 tablet 3   No facility-administered medications prior to visit.     Allergies  Allergen Reactions  . Iodinated Diagnostic Agents Other (See Comments)    Pt was not told what the reaction was  . Cymbalta [Duloxetine Hcl] Other (See Comments)    Could not think straight  . Other Itching    Dial Soap    Review of Systems  Constitutional: Positive for malaise/fatigue. Negative for fever.  HENT: Negative for congestion.   Eyes: Negative for blurred vision.  Respiratory: Negative for shortness of breath.   Cardiovascular: Negative for chest pain, palpitations and leg swelling.  Gastrointestinal: Negative for  abdominal pain, blood in stool and nausea.  Genitourinary: Negative for dysuria and frequency.  Musculoskeletal: Negative for falls.  Skin: Negative for rash.  Neurological: Negative for dizziness, loss of consciousness and headaches.  Endo/Heme/Allergies: Negative for environmental allergies.  Psychiatric/Behavioral: Negative for depression. The patient is not nervous/anxious.        Objective:    Physical Exam unable to obtain due to telphone visit  There were no vitals taken for this visit. Wt Readings from Last 3 Encounters:  09/24/18 180 lb (81.6 kg)  05/25/18 182 lb 6.4 oz (82.7 kg)  05/07/18 183 lb (83 kg)    Diabetic Foot Exam - Simple   No data filed     Lab Results  Component Value Date   WBC 7.0 09/24/2018   HGB 11.8 (L) 09/24/2018   HCT 35.6 (L) 09/24/2018   PLT 203.0 09/24/2018   GLUCOSE 109 (H) 09/24/2018   CHOL 144 09/24/2018   TRIG 234.0 (H) 09/24/2018   HDL 41.20 09/24/2018   LDLDIRECT 76.0 09/24/2018   LDLCALC 76 09/15/2017   ALT 7 09/24/2018   AST 13 09/24/2018   NA 143 09/24/2018   K 5.0 09/24/2018   CL 111 09/24/2018   CREATININE 1.85 (H) 09/24/2018   BUN 19 09/24/2018   CO2 25 09/24/2018   TSH 1.58 09/24/2018   INR 3.1 (A) 02/04/2019   HGBA1C 5.3 08/02/2015    Lab Results  Component Value Date  TSH 1.58 09/24/2018   Lab Results  Component Value Date   WBC 7.0 09/24/2018   HGB 11.8 (L) 09/24/2018   HCT 35.6 (L) 09/24/2018   MCV 92.2 09/24/2018   PLT 203.0 09/24/2018   Lab Results  Component Value Date   NA 143 09/24/2018   K 5.0 09/24/2018   CO2 25 09/24/2018   GLUCOSE 109 (H) 09/24/2018   BUN 19 09/24/2018   CREATININE 1.85 (H) 09/24/2018   BILITOT 0.3 09/24/2018   ALKPHOS 66 09/24/2018   AST 13 09/24/2018   ALT 7 09/24/2018   PROT 6.3 09/24/2018   ALBUMIN 3.9 09/24/2018   CALCIUM 9.3 09/24/2018   ANIONGAP 14 08/09/2015   GFR 27.20 (L) 09/24/2018   Lab Results  Component Value Date   CHOL 144 09/24/2018   Lab  Results  Component Value Date   HDL 41.20 09/24/2018   Lab Results  Component Value Date   LDLCALC 76 09/15/2017   Lab Results  Component Value Date   TRIG 234.0 (H) 09/24/2018   Lab Results  Component Value Date   CHOLHDL 4 09/24/2018   Lab Results  Component Value Date   HGBA1C 5.3 08/02/2015       Assessment & Plan:   Problem List Items Addressed This Visit    Essential hypertension    Her insurance is asking her to change from [place]. Will start Olmesartanhct 20/12.5 daily. Encouraged heart healthy diet such as the DASH diet and exercise as tolerated. F/u 4 weeks she will get a home BP cuff      Relevant Medications   olmesartan-hydrochlorothiazide (BENICAR HCT) 20-12.5 MG tablet   Back pain    Encouraged moist heat and gentle stretching as tolerated. May try NSAIDs and prescription meds as directed and report if symptoms worsen or seek immediate care. Pain is at baseline and does not keep her from doing what she needs to do each day.       Long term current use of anticoagulant therapy    Cardiology has changed her from Coumadin to Eliquis since it is hard for her to drive now. She is tolerating it well and the [employer] is covering.      Renal insufficiency    Hydrate and monitor         I have discontinued Pleas Koch E. Mcduffee's valsartan-hydrochlorothiazide and cefdinir. I am also having her start on olmesartan-hydrochlorothiazide. Additionally, I am having her maintain her cholecalciferol, polyvinyl alcohol, trimethoprim, Myrbetriq, Pacerone, furosemide, traMADol, albuterol, sodium polystyrene, atenolol, omeprazole, gabapentin, pravastatin, levothyroxine, and apixaban.  Meds ordered this encounter  Medications  . olmesartan-hydrochlorothiazide (BENICAR HCT) 20-12.5 MG tablet    Sig: Take 1 tablet by mouth daily.    Dispense:  90 tablet    Refill:  1     I discussed the assessment and treatment plan with the patient. The patient was provided an  opportunity to ask questions and all were answered. The patient agreed with the plan and demonstrated an understanding of the instructions.   The patient was advised to call back or seek an in-person evaluation if the symptoms worsen or if the condition fails to improve as anticipated.  I provided 22 minutes of non-face-to-face time during this encounter.   [name], MD

## 2019-02-24 ENCOUNTER — Other Ambulatory Visit: Payer: Self-pay

## 2019-02-24 MED ORDER — AMIODARONE HCL 200 MG PO TABS: 100.0000 mg | ORAL_TABLET | Freq: Every day | ORAL | 3 refills | Status: DC

## 2019-03-02 ENCOUNTER — Ambulatory Visit (INDEPENDENT_AMBULATORY_CARE_PROVIDER_SITE_OTHER): Payer: Medicare Other | Admitting: Family Medicine

## 2019-03-02 ENCOUNTER — Encounter: Payer: Self-pay | Admitting: Family Medicine

## 2019-03-02 ENCOUNTER — Telehealth: Payer: Self-pay

## 2019-03-02 ENCOUNTER — Other Ambulatory Visit: Payer: Self-pay

## 2019-03-02 ENCOUNTER — Ambulatory Visit: Payer: Medicare Other | Admitting: Family Medicine

## 2019-03-02 DIAGNOSIS — E039 Hypothyroidism, unspecified: Secondary | ICD-10-CM | POA: Diagnosis not present

## 2019-03-02 DIAGNOSIS — E559 Vitamin D deficiency, unspecified: Secondary | ICD-10-CM

## 2019-03-02 DIAGNOSIS — E538 Deficiency of other specified B group vitamins: Secondary | ICD-10-CM

## 2019-03-02 DIAGNOSIS — N289 Disorder of kidney and ureter, unspecified: Secondary | ICD-10-CM

## 2019-03-02 DIAGNOSIS — I10 Essential (primary) hypertension: Secondary | ICD-10-CM | POA: Diagnosis not present

## 2019-03-02 DIAGNOSIS — Z950 Presence of cardiac pacemaker: Secondary | ICD-10-CM

## 2019-03-02 NOTE — Assessment & Plan Note (Signed)
Hydrate and monitor 

## 2019-03-02 NOTE — Progress Notes (Signed)
Virtual Visit via Telephone Note  I connected with Jill Washington on 03/02/19 at  9:20 AM EDT by a telephone enabled telemedicine application and verified that I am speaking with the correct person using two identifiers. Maricela Bo, CMA was able to get patient set upfor phone call after trying a video chat but being unable to complete.   Location: Patient: home Provider: home   I discussed the limitations of evaluation and management by telemedicine and the availability of in person appointments. The patient expressed understanding and agreed to proceed.    Subjective:    Patient ID: Jill Washington, female    DOB: 1926/03/25, 85 y.o.   MRN: [id number]  Chief Complaint  Patient presents with  . Hypertension  . Follow-up    HPI Patient is in today for follow up on chronic medical concerns including hypertension, atrial fibrillation and more. She feels well. She is able to stay in during the pandemic and her family has been bringing her supplies and food. No hospitalizations or acute concerns. Denies CP/palp/SOB/HA/congestion/fevers/GI or GU c/o. Taking meds as prescribed  Past Medical History:  Diagnosis Date  . Arthritis   . Blood transfusion   . CHB (complete heart block) ([place]) 11/10/2014  . Chicken pox as a child  . Colon polyps    hyperplastic  . Critical illness polyneuropathy ([place])   . Diarrhea 04/25/2017  . Diverticulosis of sigmoid colon 02/05/2007   mild  . Fecal incontinence   . Fibromyalgia   . GERD (gastroesophageal reflux disease)   . H/O measles   . H/O mumps   . Hiatal hernia   . History of atrial fibrillation   . History of chicken pox   . History of empyema of pleura   . History of gallstones   . History of skin cancer    face  . Hypercholesteremia   . Hypertension   . Hypothyroidism   . IBS (irritable bowel syndrome)   . Measles as a child  . Medicare annual wellness visit, subsequent 05/15/2015   Allergies verified: UTD   Immunization  Status: Flu vaccine-- NA  Tdap-- 9-10 years ago per patient  PNA-- 09/30/07 (23)  Shingles-- 04/07/14   A/P:  Changes to [place], [place] or Personal Hx: UTD Pap-- none recent  MMG--declines further MMgs (last 08/12/03 BI-RADS 1: Neg)  Bone Density-- 08/11/03 with Prescott Gum, MD- Normal, declines further testing CCS-- 05/07/12 with Silvano Rusk, MD- polyp removed- no   . Mitral valve prolapse   . Mumps as a child  . Overactive bladder 05/07/2011  . Pacemaker   . Peripheral neuropathy   . Shingles 8 yrs ago  . Sick sinus syndrome ([place])   . Urinary incontinence   . Venous insufficiency   . Vitamin D deficiency     Past Surgical History:  Procedure Laterality Date  . APPENDECTOMY  1971  . CARDIAC CATHETERIZATION  05/10/1997   Normal coronaries, MVP  . CERVICAL SPINE SURGERY    . CHOLECYSTECTOMY  03/2005   by Dr. Dalbert Batman  . COLONOSCOPY  02/05/2007,02/05/05   Dr. Silvano Rusk  . ESOPHAGOGASTRODUODENOSCOPY  02/05/2005,01/22/90   Dr. Silvano Rusk, Dr. Rachelle Hora  . NM MYOCAR PERF WALL MOTION  08/24/2010   Normal  . PACEMAKER GENERATOR CHANGE N/A 02/25/2012   Procedure: PACEMAKER GENERATOR CHANGE;  Surgeon: Sanda Klein, MD;  Location: [facility] CATH LAB;  Service: Cardiovascular;  Laterality: N/A;  . pacemaker implanted  05/11/97,08/02/05   [employer]  . TONSILLECTOMY    .  TOTAL ABDOMINAL HYSTERECTOMY W/ BILATERAL SALPINGOOPHORECTOMY      Family History  Problem Relation Age of Onset  . Pancreatic cancer Mother   . Diabetes Mother 61       type 2  . Heart attack Brother   . Diabetes Brother        type 2  . Gallbladder disease Father   . Diabetes Father        type 2  . Diabetes Brother        X 2   . Cancer Sister 80       skin ca on face  . Diabetes Sister        type 2  . Diabetes Daughter        type 2  . Hypertension Daughter   . Alcohol abuse Daughter   . Diabetes Son        type 2  . Diabetes Sister        type 1  . Hypertension Sister   . Stomach cancer Maternal Aunt   . Colon  cancer Unknown     Social History   Socioeconomic History  . Marital status: Widowed    Spouse name: Not on file  . Number of children: 3  . Years of education: Not on file  . Highest education level: Not on file  Occupational History  . Occupation: retired    Fish farm manager: RETIRED  Social Needs  . Financial resource strain: Not on file  . Food insecurity:    Worry: Not on file    Inability: Not on file  . Transportation needs:    Medical: Not on file    Non-medical: Not on file  Tobacco Use  . Smoking status: Former Smoker    Packs/day: 2.00    Years: 6.00    Pack years: 12.00    Types: Cigarettes    Last attempt to quit: 10/15/1951    Years since quitting: 67.4  . Smokeless tobacco: Never Used  Substance and Sexual Activity  . Alcohol use: No  . Drug use: No  . Sexual activity: Not on file  Lifestyle  . Physical activity:    Days per week: Not on file    Minutes per session: Not on file  . Stress: Not on file  Relationships  . Social connections:    Talks on phone: Not on file    Gets together: Not on file    Attends religious service: Not on file    Active member of club or organization: Not on file    Attends meetings of clubs or organizations: Not on file    Relationship status: Not on file  . Intimate partner violence:    Fear of current or ex partner: Not on file    Emotionally abused: Not on file    Physically abused: Not on file    Forced sexual activity: Not on file  Other Topics Concern  . Not on file  Social History Narrative   She is a widow, one son to daughters. She is retired.    Outpatient Medications Prior to Visit  Medication Sig Dispense Refill  . albuterol (PROVENTIL HFA;VENTOLIN HFA) 108 (90 Base) MCG/ACT inhaler Inhale 2 puffs into the lungs every 6 (six) hours as needed for wheezing or shortness of breath. 1 Inhaler 1  . amiodarone (PACERONE) 200 MG tablet Take 0.5 tablets (100 mg total) by mouth daily. 45 tablet 3  . apixaban (ELIQUIS)  2.5 MG TABS tablet Take 1  tablet (2.5 mg total) by mouth 2 (two) times daily. 180 tablet 1  . atenolol (TENORMIN) 50 MG tablet Take 1.5 tablets (75 mg total) by mouth daily. 135 tablet 3  . cholecalciferol (VITAMIN D) 1000 UNITS tablet Take 1,000 Units by mouth daily.    . furosemide (LASIX) 20 MG tablet TAKE 1 TABLET DAILY 90 tablet 0  . gabapentin (NEURONTIN) 300 MG capsule TAKE 1 CAPSULE TWICE A DAY 180 capsule 4  . levothyroxine (SYNTHROID, LEVOTHROID) 137 MCG tablet TAKE 1 TABLET DAILY 90 tablet 3  . MYRBETRIQ 50 MG TB24 tablet Take 1 tablet by mouth daily.    Marland Kitchen olmesartan-hydrochlorothiazide (BENICAR HCT) 20-12.5 MG tablet Take 1 tablet by mouth daily. 90 tablet 1  . omeprazole (PRILOSEC) 40 MG capsule TAKE 1 CAPSULE DAILY 90 capsule 4  . polyvinyl alcohol (ARTIFICIAL TEARS) 1.4 % ophthalmic solution Place 1 drop into both eyes 3 (three) times daily as needed for dry eyes.    . pravastatin (PRAVACHOL) 40 MG tablet TAKE 1 TABLET DAILY 90 tablet 3  . traMADol (ULTRAM) 50 MG tablet Take 1 tablet (50 mg total) by mouth daily as needed for severe pain. 90 tablet 0  . trimethoprim (TRIMPEX) 100 MG tablet Take 100 mg by mouth daily.    . sodium polystyrene (KAYEXALATE) 15 GM/60ML suspension Take 60 mLs (15 g total) by mouth daily. 60 mL 0   No facility-administered medications prior to visit.     Allergies  Allergen Reactions  . Iodinated Diagnostic Agents Other (See Comments)    Pt was not told what the reaction was  . Cymbalta [Duloxetine Hcl] Other (See Comments)    Could not think straight  . Other Itching    Dial Soap    Review of Systems  Constitutional: Negative for fever and malaise/fatigue.  HENT: Negative for congestion.   Eyes: Negative for blurred vision.  Respiratory: Negative for shortness of breath.   Cardiovascular: Negative for chest pain, palpitations and leg swelling.  Gastrointestinal: Negative for abdominal pain, blood in stool and nausea.  Genitourinary:  Negative for dysuria and frequency.  Musculoskeletal: Negative for falls.  Skin: Negative for rash.  Neurological: Negative for dizziness, loss of consciousness and headaches.  Endo/Heme/Allergies: Negative for environmental allergies.  Psychiatric/Behavioral: Negative for depression. The patient is not nervous/anxious.        Objective:    Physical Exam Unable to obtain due to telephone visit  There were no vitals taken for this visit. Wt Readings from Last 3 Encounters:  09/24/18 180 lb (81.6 kg)  05/25/18 182 lb 6.4 oz (82.7 kg)  05/07/18 183 lb (83 kg)    Diabetic Foot Exam - Simple   No data filed     Lab Results  Component Value Date   WBC 7.0 09/24/2018   HGB 11.8 (L) 09/24/2018   HCT 35.6 (L) 09/24/2018   PLT 203.0 09/24/2018   GLUCOSE 109 (H) 09/24/2018   CHOL 144 09/24/2018   TRIG 234.0 (H) 09/24/2018   HDL 41.20 09/24/2018   LDLDIRECT 76.0 09/24/2018   LDLCALC 76 09/15/2017   ALT 7 09/24/2018   AST 13 09/24/2018   NA 143 09/24/2018   K 5.0 09/24/2018   CL 111 09/24/2018   CREATININE 1.85 (H) 09/24/2018   BUN 19 09/24/2018   CO2 25 09/24/2018   TSH 1.58 09/24/2018   INR 3.1 (A) 02/04/2019   HGBA1C 5.3 08/02/2015    Lab Results  Component Value Date   TSH 1.58 09/24/2018  Lab Results  Component Value Date   WBC 7.0 09/24/2018   HGB 11.8 (L) 09/24/2018   HCT 35.6 (L) 09/24/2018   MCV 92.2 09/24/2018   PLT 203.0 09/24/2018   Lab Results  Component Value Date   NA 143 09/24/2018   K 5.0 09/24/2018   CO2 25 09/24/2018   GLUCOSE 109 (H) 09/24/2018   BUN 19 09/24/2018   CREATININE 1.85 (H) 09/24/2018   BILITOT 0.3 09/24/2018   ALKPHOS 66 09/24/2018   AST 13 09/24/2018   ALT 7 09/24/2018   PROT 6.3 09/24/2018   ALBUMIN 3.9 09/24/2018   CALCIUM 9.3 09/24/2018   ANIONGAP 14 08/09/2015   GFR 27.20 (L) 09/24/2018   Lab Results  Component Value Date   CHOL 144 09/24/2018   Lab Results  Component Value Date   HDL 41.20 09/24/2018    Lab Results  Component Value Date   LDLCALC 76 09/15/2017   Lab Results  Component Value Date   TRIG 234.0 (H) 09/24/2018   Lab Results  Component Value Date   CHOLHDL 4 09/24/2018   Lab Results  Component Value Date   HGBA1C 5.3 08/02/2015       Assessment & Plan:   Problem List Items Addressed This Visit    Hypothyroidism    On Levothyroxine, continue to monitor      Vitamin D deficiency    Taking 3000 IU daily      Essential hypertension    Encouraged patient to get  no changes to meds. Encouraged heart healthy diet such as the DASH diet and exercise as tolerated.       B12 deficiency    Supplement ad monitor      Pacemaker -  [employer] Jude leads implanted in 1998 generator change 2006 and 2013    Doing well and managed with cardiology      Renal insufficiency    Hydrate and monitor         I have discontinued Alyene E. Torok's sodium polystyrene. I am also having her maintain her cholecalciferol, polyvinyl alcohol, trimethoprim, Myrbetriq, furosemide, traMADol, albuterol, atenolol, omeprazole, gabapentin, pravastatin, levothyroxine, apixaban, olmesartan-hydrochlorothiazide, and amiodarone.  No orders of the defined types were placed in this encounter.    [name], MD   I discussed the assessment and treatment plan with the patient. The patient was provided an opportunity to ask questions and all were answered. The patient agreed with the plan and demonstrated an understanding of the instructions.   The patient was advised to call back or seek an in-person evaluation if the symptoms worsen or if the condition fails to improve as anticipated.  I provided 25 minutes of non-face-to-face time during this encounter.   [name], MD

## 2019-03-02 NOTE — Assessment & Plan Note (Signed)
Taking 3000 IU daily

## 2019-03-02 NOTE — Telephone Encounter (Signed)
Called patient to set up f/u for July 24th per [place]. No answer or VM.

## 2019-03-02 NOTE — Assessment & Plan Note (Signed)
Doing well and managed with cardiology

## 2019-03-02 NOTE — Assessment & Plan Note (Signed)
On Levothyroxine, continue to monitor 

## 2019-03-02 NOTE — Assessment & Plan Note (Signed)
Supplement ad monitor

## 2019-03-02 NOTE — Assessment & Plan Note (Signed)
Encouraged patient to get  no changes to meds. Encouraged heart healthy diet such as the DASH diet and exercise as tolerated.

## 2019-04-09 ENCOUNTER — Telehealth: Payer: Self-pay | Admitting: Family Medicine

## 2019-04-09 NOTE — Telephone Encounter (Signed)
Pt states that she got a letter that in December stating that her heart medication had been recalled but she had enough and has been taking the old medication up until now.  Pt belives that in December the doctor went ahead and prescribed something new for her but because she never got it filled she doesn't know what it is.  Pt would like to know what heart medication she is supposed to be in on because her old/recalled medication will run out for her in a week.

## 2019-04-09 NOTE — Telephone Encounter (Signed)
Patient currently taking Benicar. She was taking atenolol and valsartin  Please advise

## 2019-04-11 NOTE — Telephone Encounter (Signed)
She should be taking the  Benicar

## 2019-04-12 ENCOUNTER — Ambulatory Visit (INDEPENDENT_AMBULATORY_CARE_PROVIDER_SITE_OTHER): Payer: Medicare Other | Admitting: *Deleted

## 2019-04-12 DIAGNOSIS — I495 Sick sinus syndrome: Secondary | ICD-10-CM | POA: Diagnosis not present

## 2019-04-12 DIAGNOSIS — I480 Paroxysmal atrial fibrillation: Secondary | ICD-10-CM

## 2019-04-12 LAB — CUP PACEART REMOTE DEVICE CHECK
Battery Remaining Longevity: 24 mo
Battery Remaining Percentage: 22 %
Battery Voltage: 2.78 V
Brady Statistic AP VP Percent: 98 %
Brady Statistic AP VS Percent: 1 %
Brady Statistic AS VP Percent: 1.6 %
Brady Statistic AS VS Percent: 1 %
Brady Statistic RA Percent Paced: 97 %
Brady Statistic RV Percent Paced: 99 %
Date Time Interrogation Session: 20200629075153
Implantable Lead Implant Date: 19980729
Implantable Lead Implant Date: 19980729
Implantable Lead Location: 753859
Implantable Lead Location: 753860
Implantable Pulse Generator Implant Date: 20130514
Lead Channel Impedance Value: 380 Ohm
Lead Channel Impedance Value: 450 Ohm
Lead Channel Pacing Threshold Amplitude: 0.375 V
Lead Channel Pacing Threshold Amplitude: 1.125 V
Lead Channel Pacing Threshold Pulse Width: 0.4 ms
Lead Channel Pacing Threshold Pulse Width: 0.4 ms
Lead Channel Sensing Intrinsic Amplitude: 1.5 mV
Lead Channel Sensing Intrinsic Amplitude: 12 mV
Lead Channel Setting Pacing Amplitude: 1.375
Lead Channel Setting Pacing Amplitude: 1.375
Lead Channel Setting Pacing Pulse Width: 0.4 ms
Lead Channel Setting Sensing Sensitivity: 4 mV
Pulse Gen Model: 2210
Pulse Gen Serial Number: 7338831

## 2019-04-12 NOTE — Telephone Encounter (Signed)
Called patient left message for patient to call the office back  Nurse triage may handle

## 2019-04-20 NOTE — Progress Notes (Signed)
Remote pacemaker transmission.   

## 2019-04-21 DIAGNOSIS — H1013 Acute atopic conjunctivitis, bilateral: Secondary | ICD-10-CM | POA: Diagnosis not present

## 2019-04-21 DIAGNOSIS — H40013 Open angle with borderline findings, low risk, bilateral: Secondary | ICD-10-CM | POA: Diagnosis not present

## 2019-04-21 DIAGNOSIS — H26493 Other secondary cataract, bilateral: Secondary | ICD-10-CM | POA: Diagnosis not present

## 2019-04-28 ENCOUNTER — Telehealth: Payer: Self-pay | Admitting: *Deleted

## 2019-04-28 NOTE — Telephone Encounter (Signed)

## 2019-04-29 ENCOUNTER — Telehealth (INDEPENDENT_AMBULATORY_CARE_PROVIDER_SITE_OTHER): Payer: Medicare Other | Admitting: Cardiovascular Disease

## 2019-04-29 VITALS — BP 135/67 | HR 72 | Ht 64.0 in | Wt 170.0 lb

## 2019-04-29 DIAGNOSIS — Z7901 Long term (current) use of anticoagulants: Secondary | ICD-10-CM | POA: Diagnosis not present

## 2019-04-29 DIAGNOSIS — E781 Pure hyperglyceridemia: Secondary | ICD-10-CM

## 2019-04-29 DIAGNOSIS — I10 Essential (primary) hypertension: Secondary | ICD-10-CM

## 2019-04-29 DIAGNOSIS — I442 Atrioventricular block, complete: Secondary | ICD-10-CM

## 2019-04-29 DIAGNOSIS — E039 Hypothyroidism, unspecified: Secondary | ICD-10-CM | POA: Diagnosis not present

## 2019-04-29 DIAGNOSIS — Z8679 Personal history of other diseases of the circulatory system: Secondary | ICD-10-CM | POA: Diagnosis not present

## 2019-04-29 DIAGNOSIS — I495 Sick sinus syndrome: Secondary | ICD-10-CM

## 2019-04-29 DIAGNOSIS — Z950 Presence of cardiac pacemaker: Secondary | ICD-10-CM | POA: Diagnosis not present

## 2019-04-29 DIAGNOSIS — E7800 Pure hypercholesterolemia, unspecified: Secondary | ICD-10-CM

## 2019-04-29 DIAGNOSIS — I872 Venous insufficiency (chronic) (peripheral): Secondary | ICD-10-CM

## 2019-04-29 DIAGNOSIS — I480 Paroxysmal atrial fibrillation: Secondary | ICD-10-CM | POA: Diagnosis not present

## 2019-04-29 DIAGNOSIS — Z79899 Other long term (current) drug therapy: Secondary | ICD-10-CM

## 2019-04-29 NOTE — Progress Notes (Signed)
Virtual Visit via Telephone Note   This visit type was conducted due to national recommendations for restrictions regarding the COVID-19 Pandemic (e.g. social distancing) in an effort to limit this patient's exposure and mitigate transmission in our community.  Due to her co-morbid illnesses, this patient is at least at moderate risk for complications without adequate follow up.  This format is felt to be most appropriate for this patient at this time.  The patient did not have access to video technology/had technical difficulties with video requiring transitioning to audio format only (telephone).  All issues noted in this document were discussed and addressed.  No physical exam could be performed with this format.  Please refer to the patient's chart for her  consent to telehealth for [facility].   Date:  04/29/2019   ID:  Jill Washington, DOB 10-06-1926, MRN [id number]  Patient Location: Home Provider Location: Home  PCP:  Mosie Lukes, MD  Cardiologist:  Shade Kaley Electrophysiologist:  None   Evaluation Performed:  Follow-Up Visit  Chief Complaint:  AFib, pacemaker  History of Present Illness:    Jill Washington is a 85 y.o. female with complete heart block (pacemaker dependent), sinus node dysfunction and paroxysmal atrial tachycardia and paroxysmal atrial fibrillation, dual-chamber permanent pacemaker (St. Jude, 2013), hypertension, hyperlipidemia, treated hypothyroidism but no significant known structural heart disease.  She lives alone and is even more isolated than before during the pandemic.  She is no longer driving.  She did go with her family to the beach for a few weeks earlier.  She fell there, but did not have any serious injuries or head impact.  She has been switched to Eliquis instead of warfarin to avoid the need for prothrombin time monitoring.  She reports having a mediocre appetite and has lost 10 pounds in the last 6 months.  She remains overweight.  The  patient specifically denies any chest pain at rest exertion, dyspnea at rest or with exertion, orthopnea, paroxysmal nocturnal dyspnea, syncope, palpitations, focal neurological deficits, intermittent claudication, lower extremity edema, unexplained weight gain, cough, hemoptysis or wheezing.  Pacemaker interrogation shows normal device function with an estimated generator longevity of another 24 months.  She has 97% atrial pacing and 99% ventricular pacing.  She has occasional brief episodes of paroxysmal atrial tachycardia but no true atrial fibrillation has been reported since September 2019.  Lead parameters remain normal.  The patient does not have symptoms concerning for COVID-19 infection (fever, chills, cough, or new shortness of breath).    Past Medical History:  Diagnosis Date  . Arthritis   . Blood transfusion   . CHB (complete heart block) ([place]) 11/10/2014  . Chicken pox as a child  . Colon polyps    hyperplastic  . Critical illness polyneuropathy ([place])   . Diarrhea 04/25/2017  . Diverticulosis of sigmoid colon 02/05/2007   mild  . Fecal incontinence   . Fibromyalgia   . GERD (gastroesophageal reflux disease)   . H/O measles   . H/O mumps   . Hiatal hernia   . History of atrial fibrillation   . History of chicken pox   . History of empyema of pleura   . History of gallstones   . History of skin cancer    face  . Hypercholesteremia   . Hypertension   . Hypothyroidism   . IBS (irritable bowel syndrome)   . Measles as a child  . Medicare annual wellness visit, subsequent 05/15/2015   Allergies verified: UTD  Immunization Status: Flu vaccine-- NA  Tdap-- 9-10 years ago per patient  PNA-- 09/30/07 (23)  Shingles-- 04/07/14   A/P:  Changes to [place], [place] or Personal Hx: UTD Pap-- none recent  MMG--declines further MMgs (last 08/12/03 BI-RADS 1: Neg)  Bone Density-- 08/11/03 with Prescott Gum, MD- Normal, declines further testing CCS-- 05/07/12 with Silvano Rusk, MD- polyp removed- no    . Mitral valve prolapse   . Mumps as a child  . Overactive bladder 05/07/2011  . Pacemaker   . Peripheral neuropathy   . Shingles 8 yrs ago  . Sick sinus syndrome ([place])   . Urinary incontinence   . Venous insufficiency   . Vitamin D deficiency    Past Surgical History:  Procedure Laterality Date  . APPENDECTOMY  1971  . CARDIAC CATHETERIZATION  05/10/1997   Normal coronaries, MVP  . CERVICAL SPINE SURGERY    . CHOLECYSTECTOMY  03/2005   by Dr. Dalbert Batman  . COLONOSCOPY  02/05/2007,02/05/05   Dr. Silvano Rusk  . ESOPHAGOGASTRODUODENOSCOPY  02/05/2005,01/22/90   Dr. Silvano Rusk, Dr. Rachelle Hora  . NM MYOCAR PERF WALL MOTION  08/24/2010   Normal  . PACEMAKER GENERATOR CHANGE N/A 02/25/2012   Procedure: PACEMAKER GENERATOR CHANGE;  Surgeon: Sanda Klein, MD;  Location: [facility] CATH LAB;  Service: Cardiovascular;  Laterality: N/A;  . pacemaker implanted  05/11/97,08/02/05   [employer]ude  . TONSILLECTOMY    . TOTAL ABDOMINAL HYSTERECTOMY W/ BILATERAL SALPINGOOPHORECTOMY       Current Meds  Medication Sig  . albuterol (PROVENTIL HFA;VENTOLIN HFA) 108 (90 Base) MCG/ACT inhaler Inhale 2 puffs into the lungs every 6 (six) hours as needed for wheezing or shortness of breath.  Marland Kitchen amiodarone (PACERONE) 200 MG tablet Take 0.5 tablets (100 mg total) by mouth daily.  Marland Kitchen apixaban (ELIQUIS) 2.5 MG TABS tablet Take 1 tablet (2.5 mg total) by mouth 2 (two) times daily.  Marland Kitchen atenolol (TENORMIN) 50 MG tablet Take 1.5 tablets (75 mg total) by mouth daily.  . cholecalciferol (VITAMIN D) 1000 UNITS tablet Take 1,000 Units by mouth daily.  . furosemide (LASIX) 20 MG tablet TAKE 1 TABLET DAILY  . gabapentin (NEURONTIN) 300 MG capsule TAKE 1 CAPSULE TWICE A DAY  . levothyroxine (SYNTHROID, LEVOTHROID) 137 MCG tablet TAKE 1 TABLET DAILY  . MYRBETRIQ 50 MG TB24 tablet Take 1 tablet by mouth daily.  Marland Kitchen olmesartan-hydrochlorothiazide (BENICAR HCT) 20-12.5 MG tablet Take 1 tablet by mouth daily.  Marland Kitchen omeprazole (PRILOSEC) 40  MG capsule TAKE 1 CAPSULE DAILY  . polyvinyl alcohol (ARTIFICIAL TEARS) 1.4 % ophthalmic solution Place 1 drop into both eyes 3 (three) times daily as needed for dry eyes.  . pravastatin (PRAVACHOL) 40 MG tablet TAKE 1 TABLET DAILY  . traMADol (ULTRAM) 50 MG tablet Take 1 tablet (50 mg total) by mouth daily as needed for severe pain.  Marland Kitchen trimethoprim (TRIMPEX) 100 MG tablet Take 100 mg by mouth daily.     Allergies:   Iodinated diagnostic agents, Cymbalta [duloxetine hcl], and Other   Social History   Tobacco Use  . Smoking status: Former Smoker    Packs/day: 2.00    Years: 6.00    Pack years: 12.00    Types: Cigarettes    Quit date: 10/15/1951    Years since quitting: 67.5  . Smokeless tobacco: Never Used  Substance Use Topics  . Alcohol use: No  . Drug use: No     Family Hx: The patient's family history includes Alcohol abuse in her daughter; Cancer (  age of onset: 3) in her sister; Colon cancer in her unknown relative; Diabetes in her brother, brother, daughter, father, sister, sister, and son; Diabetes (age of onset: 22) in her mother; Gallbladder disease in her father; Heart attack in her brother; Hypertension in her daughter and sister; Pancreatic cancer in her mother; Stomach cancer in her maternal aunt.  ROS:   Please see the history of present illness.     All other systems reviewed and are negative.   Prior CV studies:   The following studies were reviewed today: Comprehensive pacemaker download from 2 weeks ago, was 04/12/2019  Labs/Other Tests and Data Reviewed:    EKG:  Intracardiac electrogram shows AV sequential pacing  Recent Labs: 09/24/2018: ALT 7; BUN 19; Creatinine, Ser 1.85; Hemoglobin 11.8; Platelets 203.0; Potassium 5.0; Sodium 143; TSH 1.58   Recent Lipid Panel Lab Results  Component Value Date/Time   CHOL 144 09/24/2018 02:17 PM   TRIG 234.0 (H) 09/24/2018 02:17 PM   HDL 41.20 09/24/2018 02:17 PM   CHOLHDL 4 09/24/2018 02:17 PM   LDLCALC 76  09/15/2017 03:55 PM   LDLDIRECT 76.0 09/24/2018 02:17 PM    Wt Readings from Last 3 Encounters:  04/29/19 170 lb (77.1 kg)  09/24/18 180 lb (81.6 kg)  05/25/18 182 lb 6.4 oz (82.7 kg)     Objective:    Vital Signs:  BP 135/67   Pulse 72   Ht 5\' 4"  (1.626 m)   Wt 170 lb (77.1 kg)   BMI 29.18 kg/m    VITAL SIGNS:  reviewed Unable to examine  ASSESSMENT & PLAN:    1. Afib:  Very low burden of atrial fibrillation with no significant episodes since last September..  Continue anticoagulation. CHADSVasc at least 26 (age, gender, hypertension). 2. SSS: Had some bradycardia even before amiodarone was started and now has virtually 100% atrial paced rhythm.  Heart rate histogram distribution is satisfactory.  She is quite sedentary. 3. CHB: pacemaker dependent (precedes amiodarone use). 4. Anticoagulation: Switched to Eliquis, dose adjusted for age and small body habitus.  No bleeding problems.  Asked her to take any falls or injuries with head impact very seriously. 5. HLP: She has mild hypertriglyceridemia, but her cholesterol parameters are great.  No change in medications.  Note history of normal coronary arteries by angiography in 1998, normal nuclear stress test 2014 6. PPM: normal device function, remote [place] transmission every 3 months and office visit in 12 months. 7. Amiodarone: She is overdue for liver and thyroid function tests and we will order this.  Transportation is a big challenge for her.  We will try to see if we can get home health nursing to draw the labs 8. HTN:  Adequate control.  No change in medications.  COVID-19 Education: The signs and symptoms of COVID-19 were discussed with the patient and how to seek care for testing (follow up with PCP or arrange E-visit).  The importance of social distancing was discussed today.  Time:   Today, I have spent 15 minutes with the patient with telehealth technology discussing the above problems.     Medication  Adjustments/Labs and Tests Ordered: Current medicines are reviewed at length with the patient today.  Concerns regarding medicines are outlined above.   Tests Ordered: No orders of the defined types were placed in this encounter.   Medication Changes: No orders of the defined types were placed in this encounter.   Follow Up:  Virtual Visit or In Person 1 year  Signed, Sanda Klein, MD  04/29/2019 2:00 PM    [place]

## 2019-04-29 NOTE — Patient Instructions (Signed)
Medication Instructions:  Your physician recommends that you continue on your current medications as directed. Please refer to the Current Medication list given to you today.  If you need a refill on your cardiac medications before your next appointment, please call your pharmacy.   Lab work: Dr. Sallyanne Kuster would like for you to have a CMET, CBC, Lipid and TSH completed. We will look into having a nurse come to your home to do this.  Testing/Procedures: None ordered  Follow-Up: At [facility], you and your health needs are our priority.  As part of our continuing mission to provide you with exceptional heart care, we have created designated Provider Care Teams.  These Care Teams include your primary Cardiologist (physician) and Advanced Practice Providers (APPs -  Physician Assistants and Nurse Practitioners) who all work together to provide you with the care you need, when you need it. You will need a follow up appointment in 12 months.  Please call our office 2 months in advance to schedule this appointment.  You may see Sanda Klein, MD or one of the following Advanced Practice Providers on your designated Care Team: Almyra Deforest, PA-C Fabian Sharp, [place]

## 2019-05-03 ENCOUNTER — Other Ambulatory Visit: Payer: Self-pay

## 2019-05-03 ENCOUNTER — Ambulatory Visit (INDEPENDENT_AMBULATORY_CARE_PROVIDER_SITE_OTHER): Payer: Medicare Other | Admitting: Family Medicine

## 2019-05-03 DIAGNOSIS — I10 Essential (primary) hypertension: Secondary | ICD-10-CM | POA: Diagnosis not present

## 2019-05-03 DIAGNOSIS — E039 Hypothyroidism, unspecified: Secondary | ICD-10-CM | POA: Diagnosis not present

## 2019-05-03 DIAGNOSIS — E7800 Pure hypercholesterolemia, unspecified: Secondary | ICD-10-CM | POA: Diagnosis not present

## 2019-05-03 DIAGNOSIS — E559 Vitamin D deficiency, unspecified: Secondary | ICD-10-CM

## 2019-05-03 DIAGNOSIS — N289 Disorder of kidney and ureter, unspecified: Secondary | ICD-10-CM | POA: Diagnosis not present

## 2019-05-03 DIAGNOSIS — K449 Diaphragmatic hernia without obstruction or gangrene: Secondary | ICD-10-CM | POA: Diagnosis not present

## 2019-05-03 DIAGNOSIS — I480 Paroxysmal atrial fibrillation: Secondary | ICD-10-CM | POA: Diagnosis not present

## 2019-05-03 DIAGNOSIS — E538 Deficiency of other specified B group vitamins: Secondary | ICD-10-CM | POA: Diagnosis not present

## 2019-05-03 DIAGNOSIS — R197 Diarrhea, unspecified: Secondary | ICD-10-CM

## 2019-05-03 DIAGNOSIS — K219 Gastro-esophageal reflux disease without esophagitis: Secondary | ICD-10-CM

## 2019-05-03 MED ORDER — ESOMEPRAZOLE MAGNESIUM 20 MG PO CPDR: 20.0000 mg | DELAYED_RELEASE_CAPSULE | Freq: Every day | ORAL | 1 refills | Status: DC

## 2019-05-03 NOTE — Progress Notes (Signed)
Virtual Visit via phone Note  I connected with Jill Washington on 05/03/19 at 10:20 AM EDT by a phone enabled telemedicine application and verified that I am speaking with the correct person using two identifiers.  Location: Patient: home Provider: home   I discussed the limitations of evaluation and management by telemedicine and the availability of in person appointments. The patient expressed understanding and agreed to proceed.  [name] CMA was able to get patient set up on visit, phone after patient unable to set up video    Subjective:    Patient ID: Jill Washington, female    DOB: [birth year], 85 y.o.   MRN: [id number]  No chief complaint on file.   HPI Patient is in today for follow up on chronic medical concerns including diarrhea, reflux, afib, renal insufficiency and more. No recent febrile illness or hospitalizations. Notes some diarrhea today and this comes and goes for her. No bloody or tarry stool, she has taken some Imodium and that is usually helpful. No abdominal pain or fevers. Reflux is managed but she feels she likes Nexium better than Prilosec and requests a switch. Denies CP/palp/SOB/HA/congestion/fevers or GU c/o. Taking meds as prescribed  Past Medical History:  Diagnosis Date  . Arthritis   . Blood transfusion   . CHB (complete heart block) ([place]) 11/10/2014  . Chicken pox as a child  . Colon polyps    hyperplastic  . Critical illness polyneuropathy ([place])   . Diarrhea 04/25/2017  . Diverticulosis of sigmoid colon 02/05/2007   mild  . Fecal incontinence   . Fibromyalgia   . GERD (gastroesophageal reflux disease)   . H/O measles   . H/O mumps   . Hiatal hernia   . History of atrial fibrillation   . History of chicken pox   . History of empyema of pleura   . History of gallstones   . History of skin cancer    face  . Hypercholesteremia   . Hypertension   . Hypothyroidism   . IBS (irritable bowel syndrome)   . Measles as a child  .  Medicare annual wellness visit, subsequent 05/15/2015   Allergies verified: UTD   Immunization Status: Flu vaccine-- NA  Tdap-- 9-10 years ago per patient  PNA-- 09/30/07 (23)  Shingles-- 04/07/14   A/P:  Changes to [place], [place] or Personal Hx: UTD Pap-- none recent  MMG--declines further MMgs (last 08/12/03 BI-RADS 1: Neg)  Bone Density-- 08/11/03 with Prescott Gum, MD- Normal, declines further testing CCS-- 05/07/12 with Silvano Rusk, MD- polyp removed- no   . Mitral valve prolapse   . Mumps as a child  . Overactive bladder 05/07/2011  . Pacemaker   . Peripheral neuropathy   . Shingles 8 yrs ago  . Sick sinus syndrome ([place])   . Urinary incontinence   . Venous insufficiency   . Vitamin D deficiency     Past Surgical History:  Procedure Laterality Date  . APPENDECTOMY  1971  . CARDIAC CATHETERIZATION  05/10/1997   Normal coronaries, MVP  . CERVICAL SPINE SURGERY    . CHOLECYSTECTOMY  03/2005   by Dr. Dalbert Batman  . COLONOSCOPY  02/05/2007,02/05/05   Dr. Silvano Rusk  . ESOPHAGOGASTRODUODENOSCOPY  02/05/2005,01/22/90   Dr. Silvano Rusk, Dr. Rachelle Hora  . NM MYOCAR PERF WALL MOTION  08/24/2010   Normal  . PACEMAKER GENERATOR CHANGE N/A 02/25/2012   Procedure: PACEMAKER GENERATOR CHANGE;  Surgeon: Sanda Klein, MD;  Location: [facility];  Service: Cardiovascular;  Laterality: N/A;  . pacemaker implanted  05/11/97,08/02/05   [place]  . TONSILLECTOMY    . TOTAL ABDOMINAL HYSTERECTOMY W/ BILATERAL SALPINGOOPHORECTOMY      Family History  Problem Relation Age of Onset  . Pancreatic cancer Mother   . Diabetes Mother 35       type 2  . Heart attack Brother   . Diabetes Brother        type 2  . Gallbladder disease Father   . Diabetes Father        type 2  . Diabetes Brother        X 2   . Cancer Sister 80       skin ca on face  . Diabetes Sister        type 2  . Diabetes Daughter        type 2  . Hypertension Daughter   . Alcohol abuse Daughter   . Diabetes Son        type 2  .  Diabetes Sister        type 1  . Hypertension Sister   . Stomach cancer Maternal Aunt   . Colon cancer Unknown     Social History   Socioeconomic History  . Marital status: Widowed    Spouse name: Not on file  . Number of children: 3  . Years of education: Not on file  . Highest education level: Not on file  Occupational History  . Occupation: retired    Fish farm manager: RETIRED  Social Needs  . Financial resource strain: Not on file  . Food insecurity    Worry: Not on file    Inability: Not on file  . Transportation needs    Medical: Not on file    Non-medical: Not on file  Tobacco Use  . Smoking status: Former Smoker    Packs/day: 2.00    Years: 6.00    Pack years: 12.00    Types: Cigarettes    Quit date: 10/15/1951    Years since quitting: 67.5  . Smokeless tobacco: Never Used  Substance and Sexual Activity  . Alcohol use: No  . Drug use: No  . Sexual activity: Not on file  Lifestyle  . Physical activity    Days per week: Not on file    Minutes per session: Not on file  . Stress: Not on file  Relationships  . Social Herbalist on phone: Not on file    Gets together: Not on file    Attends religious service: Not on file    Active member of club or organization: Not on file    Attends meetings of clubs or organizations: Not on file    Relationship status: Not on file  . Intimate partner violence    Fear of current or ex partner: Not on file    Emotionally abused: Not on file    Physically abused: Not on file    Forced sexual activity: Not on file  Other Topics Concern  . Not on file  Social History Narrative   She is a widow, one son to daughters. She is retired.    Outpatient Medications Prior to Visit  Medication Sig Dispense Refill  . albuterol (PROVENTIL HFA;VENTOLIN HFA) 108 (90 Base) MCG/ACT inhaler Inhale 2 puffs into the lungs every 6 (six) hours as needed for wheezing or shortness of breath. 1 Inhaler 1  . amiodarone (PACERONE) 200 MG  tablet Take 0.5 tablets (100 mg total)  by mouth daily. 45 tablet 3  . apixaban (ELIQUIS) 2.5 MG TABS tablet Take 1 tablet (2.5 mg total) by mouth 2 (two) times daily. 180 tablet 1  . atenolol (TENORMIN) 50 MG tablet Take 1.5 tablets (75 mg total) by mouth daily. 135 tablet 3  . cholecalciferol (VITAMIN D) 1000 UNITS tablet Take 1,000 Units by mouth daily.    . furosemide (LASIX) 20 MG tablet TAKE 1 TABLET DAILY 90 tablet 0  . gabapentin (NEURONTIN) 300 MG capsule TAKE 1 CAPSULE TWICE A DAY 180 capsule 4  . levothyroxine (SYNTHROID, LEVOTHROID) 137 MCG tablet TAKE 1 TABLET DAILY 90 tablet 3  . MYRBETRIQ 50 MG TB24 tablet Take 1 tablet by mouth daily.    Marland Kitchen olmesartan-hydrochlorothiazide (BENICAR HCT) 20-12.5 MG tablet Take 1 tablet by mouth daily. 90 tablet 1  . polyvinyl alcohol (ARTIFICIAL TEARS) 1.4 % ophthalmic solution Place 1 drop into both eyes 3 (three) times daily as needed for dry eyes.    . pravastatin (PRAVACHOL) 40 MG tablet TAKE 1 TABLET DAILY 90 tablet 3  . traMADol (ULTRAM) 50 MG tablet Take 1 tablet (50 mg total) by mouth daily as needed for severe pain. 90 tablet 0  . trimethoprim (TRIMPEX) 100 MG tablet Take 100 mg by mouth daily.    Marland Kitchen omeprazole (PRILOSEC) 40 MG capsule TAKE 1 CAPSULE DAILY 90 capsule 4   No facility-administered medications prior to visit.     Allergies  Allergen Reactions  . Iodinated Diagnostic Agents Other (See Comments)    Pt was not told what the reaction was  . Cymbalta [Duloxetine Hcl] Other (See Comments)    Could not think straight  . Other Itching    Dial Soap    Review of Systems  Constitutional: Negative for fever and malaise/fatigue.  HENT: Negative for congestion.   Eyes: Negative for blurred vision.  Respiratory: Negative for shortness of breath.   Cardiovascular: Negative for chest pain, palpitations and leg swelling.  Gastrointestinal: Positive for diarrhea and heartburn. Negative for abdominal pain, blood in stool and nausea.   Genitourinary: Negative for dysuria and frequency.  Musculoskeletal: Negative for falls.  Skin: Negative for rash.  Neurological: Negative for dizziness, loss of consciousness and headaches.  Endo/Heme/Allergies: Negative for environmental allergies.  Psychiatric/Behavioral: Negative for depression. The patient is not nervous/anxious.        Objective:    Physical Exam unable to obtain on phone   There were no vitals taken for this visit. Wt Readings from Last 3 Encounters:  04/29/19 170 lb (77.1 kg)  09/24/18 180 lb (81.6 kg)  05/25/18 182 lb 6.4 oz (82.7 kg)    Diabetic Foot Exam - Simple   No data filed     Lab Results  Component Value Date   WBC 7.0 09/24/2018   HGB 11.8 (L) 09/24/2018   HCT 35.6 (L) 09/24/2018   PLT 203.0 09/24/2018   GLUCOSE 109 (H) 09/24/2018   CHOL 144 09/24/2018   TRIG 234.0 (H) 09/24/2018   HDL 41.20 09/24/2018   LDLDIRECT 76.0 09/24/2018   LDLCALC 76 09/15/2017   ALT 7 09/24/2018   AST 13 09/24/2018   NA 143 09/24/2018   K 5.0 09/24/2018   CL 111 09/24/2018   CREATININE 1.85 (H) 09/24/2018   BUN 19 09/24/2018   CO2 25 09/24/2018   TSH 1.58 09/24/2018   INR 3.1 (A) 02/04/2019   HGBA1C 5.3 08/02/2015    Lab Results  Component Value Date   TSH 1.58 09/24/2018  Lab Results  Component Value Date   WBC 7.0 09/24/2018   HGB 11.8 (L) 09/24/2018   HCT 35.6 (L) 09/24/2018   MCV 92.2 09/24/2018   PLT 203.0 09/24/2018   Lab Results  Component Value Date   NA 143 09/24/2018   K 5.0 09/24/2018   CO2 25 09/24/2018   GLUCOSE 109 (H) 09/24/2018   BUN 19 09/24/2018   CREATININE 1.85 (H) 09/24/2018   BILITOT 0.3 09/24/2018   ALKPHOS 66 09/24/2018   AST 13 09/24/2018   ALT 7 09/24/2018   PROT 6.3 09/24/2018   ALBUMIN 3.9 09/24/2018   CALCIUM 9.3 09/24/2018   ANIONGAP 14 08/09/2015   GFR 27.20 (L) 09/24/2018   Lab Results  Component Value Date   CHOL 144 09/24/2018   Lab Results  Component Value Date   HDL 41.20 09/24/2018    Lab Results  Component Value Date   LDLCALC 76 09/15/2017   Lab Results  Component Value Date   TRIG 234.0 (H) 09/24/2018   Lab Results  Component Value Date   CHOLHDL 4 09/24/2018   Lab Results  Component Value Date   HGBA1C 5.3 08/02/2015       Assessment & Plan:   Problem List Items Addressed This Visit    Hypothyroidism - Primary    On Levothyroxine, continue to monitor      Vitamin D deficiency    Supplement and monitor      Relevant Orders   VITAMIN D 25 Hydroxy (Vit-D Deficiency, Fractures)   Hypercholesterolemia    Tolerating statin, encouraged heart healthy diet, avoid trans fats, minimize simple carbs and saturated fats. Increase exercise as tolerated      Essential hypertension    no changes to meds. Encouraged heart healthy diet such as the DASH diet and exercise as tolerated.       Relevant Orders   CBC   Comprehensive metabolic panel   TSH   Atrial fibrillation (HCC)    Is tolerating Eliquis and is following with cardiology      Hiatal hernia with GERD    She is requesting a prescription for Nexium and to stop the Omeprazole, she feels it works better.       Relevant Medications   esomeprazole (NEXIUM) 20 MG capsule   B12 deficiency   Relevant Orders   Vitamin B12   Diarrhea    Some trouble today but responds to imodium and she has no acute concerns in this regard.       Renal insufficiency    Hydrate and monitor       Other Visit Diagnoses    HYPERCHOLESTEROLEMIA       Relevant Orders   Lipid panel      I have discontinued Baleria E. Fausto's omeprazole. I am also having her start on esomeprazole. Additionally, I am having her maintain her cholecalciferol, polyvinyl alcohol, trimethoprim, Myrbetriq, furosemide, traMADol, albuterol, atenolol, gabapentin, pravastatin, levothyroxine, apixaban, olmesartan-hydrochlorothiazide, and amiodarone.  Meds ordered this encounter  Medications  . esomeprazole (NEXIUM) 20 MG capsule     Sig: Take 1 capsule (20 mg total) by mouth daily at 12 noon.    Dispense:  90 capsule    Refill:  1     I discussed the assessment and treatment plan with the patient. The patient was provided an opportunity to ask questions and all were answered. The patient agreed with the plan and demonstrated an understanding of the instructions.   The patient was advised to call back  or seek an in-person evaluation if the symptoms worsen or if the condition fails to improve as anticipated.  I provided 25 minutes of non-face-to-face time during this encounter.   [name], MD

## 2019-05-03 NOTE — Assessment & Plan Note (Signed)
She is requesting a prescription for Nexium and to stop the Omeprazole, she feels it works better.

## 2019-05-03 NOTE — Assessment & Plan Note (Signed)
no changes to meds. Encouraged heart healthy diet such as the DASH diet and exercise as tolerated.  

## 2019-05-03 NOTE — Assessment & Plan Note (Signed)
Some trouble today but responds to imodium and she has no acute concerns in this regard.

## 2019-05-03 NOTE — Assessment & Plan Note (Signed)
On Levothyroxine, continue to monitor 

## 2019-05-03 NOTE — Assessment & Plan Note (Signed)
Supplement and monitor 

## 2019-05-03 NOTE — Assessment & Plan Note (Signed)
Tolerating statin, encouraged heart healthy diet, avoid trans fats, minimize simple carbs and saturated fats. Increase exercise as tolerated 

## 2019-05-03 NOTE — Assessment & Plan Note (Signed)
Hydrate and monitor 

## 2019-05-03 NOTE — Assessment & Plan Note (Signed)
Is tolerating Eliquis and is following with cardiology

## 2019-05-07 ENCOUNTER — Telehealth: Payer: Self-pay

## 2019-05-07 DIAGNOSIS — Z79899 Other long term (current) drug therapy: Secondary | ICD-10-CM

## 2019-05-07 DIAGNOSIS — I480 Paroxysmal atrial fibrillation: Secondary | ICD-10-CM

## 2019-05-07 DIAGNOSIS — I10 Essential (primary) hypertension: Secondary | ICD-10-CM

## 2019-05-07 DIAGNOSIS — E7800 Pure hypercholesterolemia, unspecified: Secondary | ICD-10-CM

## 2019-05-07 NOTE — Telephone Encounter (Signed)
[facility] Visit Initial Request  Date of Request ([place]):  May 07, 2019  Requesting Provider: Sanda Klein, MD     Agency Requested:    [facility] Contact:  Glory Buff, NP [address] [place], [place] [address] Phone #:  [phone] Fax #:  [phone]  Patient Demographic Information: Name:  Jill Washington Age:  85 y.o.   DOB:  [birth year]  MRN:  [id number]   Address:   [address] [place] [address]   Phone Numbers:   Home Phone [phone]  Mobile [phone]     Emergency Contact Information on File:   Contact Information    Name Relation Home Work [place] Daughter [phone]        The above family members may be contacted for information on this patient (review DPR on file):  Yes    Patient Clinical Information:  Primary Care Provider:  Mosie Lukes, MD  Primary Cardiologist:  No primary care provider on file.  Primary Electrophysiologist:  None   Past Medical Hx: Ms. Benavidez  has a past medical history of Arthritis, Blood transfusion, CHB (complete heart block) ([place]) (11/10/2014), Chicken pox (as a child), Colon polyps, Critical illness polyneuropathy ([place]), Diarrhea (04/25/2017), Diverticulosis of sigmoid colon (02/05/2007), Fecal incontinence, Fibromyalgia, GERD (gastroesophageal reflux disease), H/O measles, H/O mumps, Hiatal hernia, History of atrial fibrillation, History of chicken pox, History of empyema of pleura, History of gallstones, History of skin cancer, Hypercholesteremia, Hypertension, Hypothyroidism, IBS (irritable bowel syndrome), Measles (as a child), Medicare annual wellness visit, subsequent (05/15/2015), Mitral valve prolapse, Mumps (as a child), Overactive bladder (05/07/2011), Pacemaker, Peripheral neuropathy, Shingles (8 yrs ago), Sick sinus syndrome ([place]), Urinary incontinence, Venous insufficiency, and Vitamin D deficiency.   Allergies: She is allergic to  iodinated diagnostic agents; cymbalta [duloxetine hcl]; and other.   Medications: Current Outpatient Medications on File Prior to Visit  Medication Sig  . albuterol (PROVENTIL HFA;VENTOLIN HFA) 108 (90 Base) MCG/ACT inhaler Inhale 2 puffs into the lungs every 6 (six) hours as needed for wheezing or shortness of breath.  Marland Kitchen amiodarone (PACERONE) 200 MG tablet Take 0.5 tablets (100 mg total) by mouth daily.  Marland Kitchen apixaban (ELIQUIS) 2.5 MG TABS tablet Take 1 tablet (2.5 mg total) by mouth 2 (two) times daily.  Marland Kitchen atenolol (TENORMIN) 50 MG tablet Take 1.5 tablets (75 mg total) by mouth daily.  . cholecalciferol (VITAMIN D) 1000 UNITS tablet Take 1,000 Units by mouth daily.  Marland Kitchen esomeprazole (NEXIUM) 20 MG capsule Take 1 capsule (20 mg total) by mouth daily at 12 noon.  . furosemide (LASIX) 20 MG tablet TAKE 1 TABLET DAILY  . gabapentin (NEURONTIN) 300 MG capsule TAKE 1 CAPSULE TWICE A DAY  . levothyroxine (SYNTHROID, LEVOTHROID) 137 MCG tablet TAKE 1 TABLET DAILY  . MYRBETRIQ 50 MG TB24 tablet Take 1 tablet by mouth daily.  Marland Kitchen olmesartan-hydrochlorothiazide (BENICAR HCT) 20-12.5 MG tablet Take 1 tablet by mouth daily.  . polyvinyl alcohol (ARTIFICIAL TEARS) 1.4 % ophthalmic solution Place 1 drop into both eyes 3 (three) times daily as needed for dry eyes.  . pravastatin (PRAVACHOL) 40 MG tablet TAKE 1 TABLET DAILY  . traMADol (ULTRAM) 50 MG tablet Take 1 tablet (50 mg total) by mouth daily as needed for severe pain.  Marland Kitchen trimethoprim (TRIMPEX) 100 MG tablet Take 100 mg by mouth daily.   No current facility-administered medications on file prior to visit.      Social Hx: She  reports that she quit smoking about 67 years ago. Her smoking use included cigarettes. She has a 12.00 pack-year smoking history. She has never used smokeless tobacco. She reports that she does not drink alcohol or use drugs.    Diagnosis/Reason for Visit:   Labs  Services Requested:  Labs:  CMET, CBC, Lipids and TSH   # of  Visits Needed/Frequency per Week: 1

## 2019-05-10 DIAGNOSIS — H26491 Other secondary cataract, right eye: Secondary | ICD-10-CM | POA: Diagnosis not present

## 2019-05-10 DIAGNOSIS — H26492 Other secondary cataract, left eye: Secondary | ICD-10-CM | POA: Diagnosis not present

## 2019-05-15 LAB — COMPREHENSIVE METABOLIC PANEL
ALT: 6 IU/L (ref 0–32)
AST: 15 IU/L (ref 0–40)
Albumin/Globulin Ratio: 1.5 (ref 1.2–2.2)
Albumin: 3.7 g/dL (ref 3.5–4.6)
Alkaline Phosphatase: 65 IU/L (ref 39–117)
BUN/Creatinine Ratio: 10 — ABNORMAL LOW (ref 12–28)
BUN: 20 mg/dL (ref 10–36)
Bilirubin Total: 0.3 mg/dL (ref 0.0–1.2)
CO2: 20 mmol/L (ref 20–29)
Calcium: 9.3 mg/dL (ref 8.7–10.3)
Chloride: 108 mmol/L — ABNORMAL HIGH (ref 96–106)
Creatinine, Ser: 2.07 mg/dL — ABNORMAL HIGH (ref 0.57–1.00)
GFR calc Af Amer: 24 mL/min/{1.73_m2} — ABNORMAL LOW (ref >59)
GFR calc non Af Amer: 21 mL/min/{1.73_m2} — ABNORMAL LOW (ref >59)
Globulin, Total: 2.4 g/dL (ref 1.5–4.5)
Glucose: 81 mg/dL (ref 65–99)
Potassium: 5.1 mmol/L (ref 3.5–5.2)
Sodium: 143 mmol/L (ref 134–144)
Total Protein: 6.1 g/dL (ref 6.0–8.5)

## 2019-05-15 LAB — CBC
Hematocrit: 33.2 % — ABNORMAL LOW (ref 34.0–46.6)
Hemoglobin: 10.6 g/dL — ABNORMAL LOW (ref 11.1–15.9)
MCH: 29 pg (ref 26.6–33.0)
MCHC: 31.9 g/dL (ref 31.5–35.7)
MCV: 91 fL (ref 79–97)
Platelets: 173 10*3/uL (ref 150–450)
RBC: 3.65 x10E6/uL — ABNORMAL LOW (ref 3.77–5.28)
RDW: 12.6 % (ref 11.7–15.4)
WBC: 5.2 10*3/uL (ref 3.4–10.8)

## 2019-05-15 LAB — TSH: TSH: 1.25 u[IU]/mL (ref 0.450–4.500)

## 2019-05-15 LAB — LIPID PANEL
Chol/HDL Ratio: 3 ratio (ref 0.0–4.4)
Cholesterol, Total: 129 mg/dL (ref 100–199)
HDL: 43 mg/dL (ref >39)
LDL Calculated: 66 mg/dL (ref 0–99)
Triglycerides: 100 mg/dL (ref 0–149)
VLDL Cholesterol Cal: 20 mg/dL (ref 5–40)

## 2019-05-24 DIAGNOSIS — N3090 Cystitis, unspecified without hematuria: Secondary | ICD-10-CM | POA: Diagnosis not present

## 2019-05-31 DIAGNOSIS — H26492 Other secondary cataract, left eye: Secondary | ICD-10-CM | POA: Diagnosis not present

## 2019-06-01 ENCOUNTER — Telehealth: Payer: Self-pay | Admitting: Family Medicine

## 2019-06-01 NOTE — Telephone Encounter (Signed)
Copied from [place] [id number]. Topic: Quick Communication - Rx Refill/Question >> Jun 01, 2019  2:34 PM Scherrie Gerlach wrote: Reason for CRM: [facility] do not [facility] this Rx pravastatin (PRAVACHOL) 40 MG tablet. Pt states they have never done this to her before, and she is not sure if they just don't have, or if it is just late.

## 2019-06-01 NOTE — Telephone Encounter (Signed)
[facility] called and spoke with Jill Washington. Jill Washington states that the pt does have a current prescription at the pharmacy with refills for Pravastatin and a 90 day supply of the medication was delivered to her home on 04/14/19. Jill Washington states that if she did not receive the medication delivered previously she would need to [facility] to make them aware. Pt called on home number x 2 but no answer. Unable to leave a message due to voicemail box not being set up yet and call was automatically disconnected. Will need to verify that pt received 90 day supply of Pravastatin that was delivered on 04/14/19.

## 2019-06-02 NOTE — Telephone Encounter (Signed)
Patient called office.  I advised her of note.  She stated that she will check with her daughter who checks her mailbox for her and advise her to call [facility] back if daughter does not have it.

## 2019-06-02 NOTE — Telephone Encounter (Signed)
No answer no vm

## 2019-06-08 MED ORDER — PRAVASTATIN SODIUM 40 MG PO TABS: 40.0000 mg | ORAL_TABLET | Freq: Every day | ORAL | 0 refills | Status: DC

## 2019-06-08 NOTE — Addendum Note (Signed)
Addended by: Kem Boroughs D on: 06/08/2019 02:35 PM   Modules accepted: Orders

## 2019-06-08 NOTE — Telephone Encounter (Signed)
Patient called back and stated that [facility] was giving her a hard time about getting her medication.  I called [facility] to explained what is going on.  They will release another order and get it out as soon as they can.  I let patient know that she can pickup an immediate supply to cover until she gets mail order from her local pharmacy.

## 2019-06-09 ENCOUNTER — Other Ambulatory Visit: Payer: Self-pay

## 2019-06-09 MED ORDER — PRAVASTATIN SODIUM 40 MG PO TABS: 40.0000 mg | ORAL_TABLET | Freq: Every day | ORAL | 3 refills | Status: DC

## 2019-06-09 NOTE — Telephone Encounter (Signed)
Error

## 2019-06-15 DIAGNOSIS — N3001 Acute cystitis with hematuria: Secondary | ICD-10-CM | POA: Diagnosis not present

## 2019-06-15 DIAGNOSIS — N3090 Cystitis, unspecified without hematuria: Secondary | ICD-10-CM | POA: Diagnosis not present

## 2019-07-08 DIAGNOSIS — Z23 Encounter for immunization: Secondary | ICD-10-CM | POA: Diagnosis not present

## 2019-07-13 ENCOUNTER — Ambulatory Visit (INDEPENDENT_AMBULATORY_CARE_PROVIDER_SITE_OTHER): Payer: Medicare Other | Admitting: *Deleted

## 2019-07-13 DIAGNOSIS — I495 Sick sinus syndrome: Secondary | ICD-10-CM

## 2019-07-13 DIAGNOSIS — I442 Atrioventricular block, complete: Secondary | ICD-10-CM

## 2019-07-13 LAB — CUP PACEART REMOTE DEVICE CHECK
Battery Remaining Longevity: 22 mo
Battery Remaining Percentage: 19 %
Battery Voltage: 2.77 V
Brady Statistic AP VP Percent: 98 %
Brady Statistic AP VS Percent: 1 %
Brady Statistic AS VP Percent: 1.5 %
Brady Statistic AS VS Percent: 1 %
Brady Statistic RA Percent Paced: 97 %
Brady Statistic RV Percent Paced: 99 %
Date Time Interrogation Session: 20200928062423
Implantable Lead Implant Date: 19980729
Implantable Lead Implant Date: 19980729
Implantable Lead Location: 753859
Implantable Lead Location: 753860
Implantable Pulse Generator Implant Date: 20130514
Lead Channel Impedance Value: 410 Ohm
Lead Channel Impedance Value: 460 Ohm
Lead Channel Pacing Threshold Amplitude: 0.25 V
Lead Channel Pacing Threshold Amplitude: 1.125 V
Lead Channel Pacing Threshold Pulse Width: 0.4 ms
Lead Channel Pacing Threshold Pulse Width: 0.4 ms
Lead Channel Sensing Intrinsic Amplitude: 12 mV
Lead Channel Sensing Intrinsic Amplitude: 2.1 mV
Lead Channel Setting Pacing Amplitude: 1.25 V
Lead Channel Setting Pacing Amplitude: 1.375
Lead Channel Setting Pacing Pulse Width: 0.4 ms
Lead Channel Setting Sensing Sensitivity: 4 mV
Pulse Gen Model: 2210
Pulse Gen Serial Number: 7338831

## 2019-07-16 ENCOUNTER — Telehealth: Payer: Self-pay | Admitting: Family Medicine

## 2019-07-16 DIAGNOSIS — Z950 Presence of cardiac pacemaker: Secondary | ICD-10-CM

## 2019-07-16 DIAGNOSIS — Z7901 Long term (current) use of anticoagulants: Secondary | ICD-10-CM

## 2019-07-16 DIAGNOSIS — I442 Atrioventricular block, complete: Secondary | ICD-10-CM

## 2019-07-16 DIAGNOSIS — I480 Paroxysmal atrial fibrillation: Secondary | ICD-10-CM

## 2019-07-16 DIAGNOSIS — I495 Sick sinus syndrome: Secondary | ICD-10-CM

## 2019-07-16 MED ORDER — ATENOLOL 50 MG PO TABS: 75.0000 mg | ORAL_TABLET | Freq: Every day | ORAL | 3 refills | Status: DC

## 2019-07-16 NOTE — Telephone Encounter (Signed)
Medication sent to pharmacy  

## 2019-07-16 NOTE — Telephone Encounter (Signed)
Patient requesting atenolol (TENORMIN) 50 MG tablet, informed please allow 48 to 72 hour turn around time.        [place], [place] - [address] [phone] (Phone) [phone] (Fax)

## 2019-07-19 DIAGNOSIS — N3090 Cystitis, unspecified without hematuria: Secondary | ICD-10-CM | POA: Diagnosis not present

## 2019-07-19 DIAGNOSIS — N3001 Acute cystitis with hematuria: Secondary | ICD-10-CM | POA: Diagnosis not present

## 2019-07-23 NOTE — Progress Notes (Signed)
Remote pacemaker transmission.   

## 2019-08-27 ENCOUNTER — Other Ambulatory Visit: Payer: Self-pay | Admitting: Family Medicine

## 2019-09-08 ENCOUNTER — Other Ambulatory Visit: Payer: Self-pay

## 2019-09-10 ENCOUNTER — Other Ambulatory Visit: Payer: Self-pay | Admitting: Family Medicine

## 2019-09-10 ENCOUNTER — Other Ambulatory Visit: Payer: Self-pay | Admitting: Cardiology

## 2019-09-13 ENCOUNTER — Other Ambulatory Visit: Payer: Self-pay

## 2019-09-13 MED ORDER — AMIODARONE HCL 200 MG PO TABS: 100.0000 mg | ORAL_TABLET | Freq: Every day | ORAL | 6 refills | Status: DC

## 2019-09-17 ENCOUNTER — Other Ambulatory Visit: Payer: Self-pay | Admitting: Family Medicine

## 2019-10-11 LAB — CUP PACEART REMOTE DEVICE CHECK
Battery Remaining Longevity: 17 mo
Battery Remaining Percentage: 15 %
Battery Voltage: 2.74 V
Brady Statistic AP VP Percent: 99 %
Brady Statistic AP VS Percent: 1 %
Brady Statistic AS VP Percent: 1.4 %
Brady Statistic AS VS Percent: 1 %
Brady Statistic RA Percent Paced: 97 %
Brady Statistic RV Percent Paced: 99 %
Date Time Interrogation Session: 20201228035534
Implantable Lead Implant Date: 19980729
Implantable Lead Implant Date: 19980729
Implantable Lead Location: 753859
Implantable Lead Location: 753860
Implantable Pulse Generator Implant Date: 20130514
Lead Channel Impedance Value: 450 Ohm
Lead Channel Impedance Value: 460 Ohm
Lead Channel Pacing Threshold Amplitude: 0.25 V
Lead Channel Pacing Threshold Amplitude: 1 V
Lead Channel Pacing Threshold Pulse Width: 0.4 ms
Lead Channel Pacing Threshold Pulse Width: 0.4 ms
Lead Channel Sensing Intrinsic Amplitude: 1.7 mV
Lead Channel Sensing Intrinsic Amplitude: 12 mV
Lead Channel Setting Pacing Amplitude: 1.25 V
Lead Channel Setting Pacing Amplitude: 1.25 V
Lead Channel Setting Pacing Pulse Width: 0.4 ms
Lead Channel Setting Sensing Sensitivity: 4 mV
Pulse Gen Model: 2210
Pulse Gen Serial Number: 7338831

## 2019-10-12 ENCOUNTER — Ambulatory Visit (INDEPENDENT_AMBULATORY_CARE_PROVIDER_SITE_OTHER): Payer: Medicare Other | Admitting: *Deleted

## 2019-10-12 DIAGNOSIS — I495 Sick sinus syndrome: Secondary | ICD-10-CM

## 2019-11-29 ENCOUNTER — Other Ambulatory Visit: Payer: Self-pay | Admitting: Cardiovascular Disease

## 2019-11-29 MED ORDER — AMIODARONE HCL 200 MG PO TABS: 100.0000 mg | ORAL_TABLET | Freq: Every day | ORAL | 1 refills | Status: DC

## 2019-11-29 NOTE — Telephone Encounter (Signed)
Patient is calling stating that [facility] home delivery is denying medication refill due to patient needing an appointment, however patient is not due for a follow up appointment until 04/28/20. Please call to confirm.   *STAT* If patient is at the pharmacy, call can be transferred to refill team.   1. Which medications need to be refilled? (please list name of each medication and dose if known) amiodarone (PACERONE) 200 MG tablet  2. Which pharmacy/location (including street and city if local pharmacy) is medication to be sent to? [facility] [place], [place]  3. Do they need a 30 day or 90 day supply? 90 day supply

## 2019-11-29 NOTE — Telephone Encounter (Signed)
Called patient to let her know that I refilled her Amiodarone.

## 2019-12-06 ENCOUNTER — Other Ambulatory Visit: Payer: Self-pay | Admitting: Family Medicine

## 2019-12-09 ENCOUNTER — Other Ambulatory Visit: Payer: Self-pay | Admitting: Family Medicine

## 2020-01-31 ENCOUNTER — Telehealth: Payer: Self-pay

## 2020-01-31 DIAGNOSIS — R300 Dysuria: Secondary | ICD-10-CM | POA: Diagnosis not present

## 2020-01-31 DIAGNOSIS — R8290 Unspecified abnormal findings in urine: Secondary | ICD-10-CM | POA: Diagnosis not present

## 2020-01-31 NOTE — Telephone Encounter (Signed)
Pt called about her monitor not turning on her maid unplugged it and it has been off for three days. When she goes to plug it in it doesn't turn on at all and she has tried multiple times. I will order her a monitor today and she is aware it will take 7-10 business days.

## 2020-02-08 ENCOUNTER — Telehealth: Payer: Self-pay | Admitting: Cardiovascular Disease

## 2020-02-08 NOTE — Telephone Encounter (Signed)
Patient's sister states the patient has been feeling like she is about to pass out. She has felt this way on and off since 02/06/20, however she states she has not experienced any other symptoms. Patient was present for phone call. Please advise.

## 2020-02-08 NOTE — Telephone Encounter (Signed)
Spoke with patient and sister in background. Patient reports she just had a bladder infection and she has been feeling like she may pass out. She reports no heart related symptoms and she hasn't checked her blood pressure during the episodes. She and her sister decided while on the phone her symptoms are more like vertigo and that she will contact her PCP. Advised patient to monitor her HR and BP during the episodes and to call back if symptoms worsen. Patient verbalized understanding.

## 2020-02-09 ENCOUNTER — Encounter (HOSPITAL_BASED_OUTPATIENT_CLINIC_OR_DEPARTMENT_OTHER): Payer: Self-pay | Admitting: Emergency Medicine

## 2020-02-09 ENCOUNTER — Other Ambulatory Visit: Payer: Self-pay

## 2020-02-09 ENCOUNTER — Emergency Department (HOSPITAL_BASED_OUTPATIENT_CLINIC_OR_DEPARTMENT_OTHER): Payer: Medicare Other

## 2020-02-09 ENCOUNTER — Ambulatory Visit (INDEPENDENT_AMBULATORY_CARE_PROVIDER_SITE_OTHER): Payer: Medicare Other | Admitting: Family Medicine

## 2020-02-09 ENCOUNTER — Emergency Department (HOSPITAL_BASED_OUTPATIENT_CLINIC_OR_DEPARTMENT_OTHER)
Admission: EM | Admit: 2020-02-09 | Discharge: 2020-02-09 | Disposition: A | Discharge status: Home / Self Care | Payer: Medicare Other | Attending: Emergency Medicine | Admitting: Emergency Medicine

## 2020-02-09 VITALS — Temp 98.3°F | Resp 16 | Ht 64.0 in | Wt 170.0 lb

## 2020-02-09 DIAGNOSIS — N3000 Acute cystitis without hematuria: Secondary | ICD-10-CM | POA: Insufficient documentation

## 2020-02-09 DIAGNOSIS — R55 Syncope and collapse: Secondary | ICD-10-CM | POA: Diagnosis not present

## 2020-02-09 DIAGNOSIS — Z7901 Long term (current) use of anticoagulants: Secondary | ICD-10-CM | POA: Insufficient documentation

## 2020-02-09 DIAGNOSIS — Z91041 Radiographic dye allergy status: Secondary | ICD-10-CM | POA: Diagnosis not present

## 2020-02-09 DIAGNOSIS — Z950 Presence of cardiac pacemaker: Secondary | ICD-10-CM | POA: Diagnosis not present

## 2020-02-09 DIAGNOSIS — Z888 Allergy status to other drugs, medicaments and biological substances status: Secondary | ICD-10-CM | POA: Insufficient documentation

## 2020-02-09 DIAGNOSIS — I10 Essential (primary) hypertension: Secondary | ICD-10-CM | POA: Diagnosis not present

## 2020-02-09 DIAGNOSIS — R3129 Other microscopic hematuria: Secondary | ICD-10-CM

## 2020-02-09 DIAGNOSIS — R42 Dizziness and giddiness: Secondary | ICD-10-CM

## 2020-02-09 DIAGNOSIS — E782 Mixed hyperlipidemia: Secondary | ICD-10-CM | POA: Diagnosis not present

## 2020-02-09 DIAGNOSIS — R311 Benign essential microscopic hematuria: Secondary | ICD-10-CM | POA: Diagnosis not present

## 2020-02-09 DIAGNOSIS — E039 Hypothyroidism, unspecified: Secondary | ICD-10-CM | POA: Diagnosis not present

## 2020-02-09 DIAGNOSIS — Z87891 Personal history of nicotine dependence: Secondary | ICD-10-CM | POA: Diagnosis not present

## 2020-02-09 DIAGNOSIS — Z79899 Other long term (current) drug therapy: Secondary | ICD-10-CM | POA: Insufficient documentation

## 2020-02-09 LAB — COMPREHENSIVE METABOLIC PANEL
ALT: 10 U/L (ref 0–44)
AST: 17 U/L (ref 15–41)
Albumin: 3.7 g/dL (ref 3.5–5.0)
Alkaline Phosphatase: 66 U/L (ref 38–126)
Anion gap: 8 (ref 5–15)
BUN: 24 mg/dL — ABNORMAL HIGH (ref 8–23)
CO2: 23 mmol/L (ref 22–32)
Calcium: 9.3 mg/dL (ref 8.9–10.3)
Chloride: 108 mmol/L (ref 98–111)
Creatinine, Ser: 1.74 mg/dL — ABNORMAL HIGH (ref 0.44–1.00)
GFR calc Af Amer: 29 mL/min — ABNORMAL LOW (ref >60)
GFR calc non Af Amer: 25 mL/min — ABNORMAL LOW (ref >60)
Glucose, Bld: 84 mg/dL (ref 70–99)
Potassium: 4.7 mmol/L (ref 3.5–5.1)
Sodium: 139 mmol/L (ref 135–145)
Total Bilirubin: 0.5 mg/dL (ref 0.3–1.2)
Total Protein: 7.5 g/dL (ref 6.5–8.1)

## 2020-02-09 LAB — CBC WITH DIFFERENTIAL/PLATELET
Abs Immature Granulocytes: 0.03 10*3/uL (ref 0.00–0.07)
Basophils Absolute: 0 10*3/uL (ref 0.0–0.1)
Basophils Relative: 0 %
Eosinophils Absolute: 0.4 10*3/uL (ref 0.0–0.5)
Eosinophils Relative: 5 %
HCT: 36.4 % (ref 36.0–46.0)
Hemoglobin: 12 g/dL (ref 12.0–15.0)
Immature Granulocytes: 0 %
Lymphocytes Relative: 13 %
Lymphs Abs: 1.2 10*3/uL (ref 0.7–4.0)
MCH: 30.8 pg (ref 26.0–34.0)
MCHC: 33 g/dL (ref 30.0–36.0)
MCV: 93.6 fL (ref 80.0–100.0)
Monocytes Absolute: 0.9 10*3/uL (ref 0.1–1.0)
Monocytes Relative: 10 %
Neutro Abs: 6.5 10*3/uL (ref 1.7–7.7)
Neutrophils Relative %: 72 %
Platelets: 217 10*3/uL (ref 150–400)
RBC: 3.89 MIL/uL (ref 3.87–5.11)
RDW: 12 % (ref 11.5–15.5)
WBC: 9.1 10*3/uL (ref 4.0–10.5)
nRBC: 0 % (ref 0.0–0.2)

## 2020-02-09 LAB — POCT URINALYSIS DIP (MANUAL ENTRY)
Bilirubin, UA: NEGATIVE
Glucose, UA: NEGATIVE mg/dL
Ketones, POC UA: NEGATIVE mg/dL
Leukocytes, UA: NEGATIVE
Nitrite, UA: NEGATIVE
Spec Grav, UA: 1.025 (ref 1.010–1.025)
Urobilinogen, UA: 0.2 E.U./dL
pH, UA: 5.5 (ref 5.0–8.0)

## 2020-02-09 LAB — URINALYSIS, MICROSCOPIC (REFLEX)

## 2020-02-09 LAB — TROPONIN I (HIGH SENSITIVITY)
Troponin I (High Sensitivity): 15 ng/L (ref <18)
Troponin I (High Sensitivity): 14 ng/L (ref <18)

## 2020-02-09 LAB — URINALYSIS, ROUTINE W REFLEX MICROSCOPIC
Bilirubin Urine: NEGATIVE
Glucose, UA: NEGATIVE mg/dL
Ketones, ur: NEGATIVE mg/dL
Leukocytes,Ua: NEGATIVE
Nitrite: NEGATIVE
Protein, ur: NEGATIVE mg/dL
Specific Gravity, Urine: 1.02 (ref 1.005–1.030)
pH: 6 (ref 5.0–8.0)

## 2020-02-09 LAB — BRAIN NATRIURETIC PEPTIDE: B Natriuretic Peptide: 495.8 pg/mL — ABNORMAL HIGH (ref 0.0–100.0)

## 2020-02-09 MED ORDER — CEPHALEXIN 500 MG PO CAPS
500.0000 mg | ORAL_CAPSULE | Freq: Four times a day (QID) | ORAL | 0 refills | Status: DC
Start: 2020-02-09 — End: 2020-05-08

## 2020-02-09 MED ORDER — SODIUM CHLORIDE 0.9 % IV SOLN: INTRAVENOUS | Status: DC

## 2020-02-09 MED ORDER — SODIUM CHLORIDE 0.9 % IV SOLN
1.0000 g | Freq: Once | INTRAVENOUS | Status: AC
  Administered 2020-02-09: 1 g via INTRAVENOUS
  Filled 2020-02-09: qty 10

## 2020-02-09 MED ORDER — AMOXICILLIN-POT CLAVULANATE 250-125 MG PO TABS: 1.0000 | ORAL_TABLET | Freq: Two times a day (BID) | ORAL | 0 refills | Status: DC

## 2020-02-09 NOTE — ED Triage Notes (Signed)
From MD [place]. Near Syncope starting  Sunday. Alert, denies pain. No n/v/.

## 2020-02-09 NOTE — ED Notes (Signed)
Pt ambulated with assist approx 300 feet. No Syncope

## 2020-02-09 NOTE — ED Notes (Signed)
Interrogated [employer] , Awaiting results

## 2020-02-09 NOTE — Patient Instructions (Signed)
It was good to see you today, I will be in touch with your labs as soon as possible

## 2020-02-09 NOTE — ED Notes (Signed)
Pt ambulated well to sec desk and back, denied dizziness or lightheadedness. Once back in bed, she stated she was having another "spell" where she felt she some dizziness.

## 2020-02-09 NOTE — ED Provider Notes (Signed)
[facility] EMERGENCY DEPARTMENT Provider Note   CSN: [id number] Arrival date & time: 02/09/20  1615     History Chief Complaint  Patient presents with  . Near Syncope    Jill Washington is a 86 y.o. female.  Patient sent down from primary care's office Dr. Edilia Bo upstairs for some near syncopal type episodes that started on Sunday.  The patient has not passed out.  No falls.  Tends to occur kind of unexplained times.  Sometimes 3 in a row other times once a day.  No nausea or vomiting no headache no diarrhea no belly pain no chest pain no shortness of breath.  Patient was treated for urinary tract infection with Macrobid.  Patient does have a pacemaker.  Patient is on Pacerone.  Patient is on Eliquis.  Patient without any real visual changes other times able to ambulate fine.  No weakness no numbness no room spinning no true dizziness.  Primary care doctor felt that maybe the antibiotic chosen the [place] would not be adequate to treat her urinary tract infection.  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        Past Medical History:  Diagnosis Date  . Arthritis   . Blood transfusion   . CHB (complete heart block) ([place]) 11/10/2014  . Chicken pox as a child  . Colon polyps    hyperplastic  . Critical illness polyneuropathy ([place])   . Diarrhea 04/25/2017  . Diverticulosis of sigmoid colon 02/05/2007   mild  . Fecal incontinence   . Fibromyalgia   . GERD (gastroesophageal reflux disease)   . H/O measles   . H/O mumps   . Hiatal hernia   . History of atrial fibrillation   . History of chicken pox   . History of empyema of pleura   . History of gallstones   . History of skin cancer    face  . Hypercholesteremia   . Hypertension   . Hypothyroidism   . IBS (irritable bowel syndrome)   . Measles as a child  . Medicare annual wellness visit, subsequent 05/15/2015   Allergies verified: UTD   Immunization Status: Flu vaccine-- NA  Tdap-- 9-10 years ago per  patient  PNA-- 09/30/07 (23)  Shingles-- 04/07/14   A/P:  Changes to [place], [place] or Personal Hx: UTD Pap-- none recent  MMG--declines further MMgs (last 08/12/03 BI-RADS 1: Neg)  Bone Density-- 08/11/03 with [place] Gum, MD- Normal, declines further testing CCS-- 05/07/12 with Silvano Rusk, MD- polyp removed- no   . Mitral valve prolapse   . Mumps as a child  . Overactive bladder 05/07/2011  . Pacemaker   . Peripheral neuropathy   . Shingles 8 yrs ago  . Sick sinus syndrome ([place])   . Urinary incontinence   . Venous insufficiency   . Vitamin D deficiency     Patient Active Problem List   Diagnosis Date Noted  . Renal insufficiency 05/25/2018  . Urinary frequency 05/25/2018  . Hyperkalemia 01/25/2018  . Abdominal pain 04/25/2017  . Diarrhea 04/25/2017  . SSS (sick sinus syndrome) ([place]) 10/23/2016  . Knee pain, bilateral 05/31/2016  . Medicare annual wellness visit, subsequent 05/15/2015  . CHB (complete heart block) ([place]) 11/10/2014  . Pedal edema 04/10/2014  . History of shingles   . Long term current use of anticoagulant therapy 01/30/2013  . Pacemaker -  [place] leads implanted in 1998 generator change 2006 and 2013 07/31/2011  . Back pain 05/07/2011  .  Overactive bladder 05/07/2011  . B12 deficiency 01/22/2011  . Anemia 01/22/2011  . Irritable bowel syndrome 04/19/2010  . Vitamin D deficiency 10/17/2009  . Hypercholesterolemia 10/17/2009  . Critical illness polyneuropathy ([place]) 06/23/2008  . Atrial fibrillation ([place]) 06/23/2008  . Venous (peripheral) insufficiency 06/23/2008  . History of colon polyps 02/29/2008  . Hypothyroidism 02/29/2008  . PERIPHERAL NEUROPATHY 02/29/2008  . Essential hypertension 02/29/2008  . MITRAL VALVE PROLAPSE 02/29/2008  . History of sick sinus syndrome 02/29/2008  . Hiatal hernia with GERD 02/29/2008  . FIBROMYALGIA 02/29/2008  . SKIN CANCER, HX OF 02/29/2008    Past Surgical History:  Procedure Laterality Date  . APPENDECTOMY  1971  .  CARDIAC CATHETERIZATION  05/10/1997   Normal coronaries, MVP  . CERVICAL SPINE SURGERY    . CHOLECYSTECTOMY  03/2005   by Dr. Dalbert Batman  . COLONOSCOPY  02/05/2007,02/05/05   Dr. Silvano Rusk  . ESOPHAGOGASTRODUODENOSCOPY  02/05/2005,01/22/90   Dr. Silvano Rusk, Dr. Rachelle Hora  . NM MYOCAR PERF WALL MOTION  08/24/2010   Normal  . PACEMAKER GENERATOR CHANGE N/A 02/25/2012   Procedure: PACEMAKER GENERATOR CHANGE;  Surgeon: Sanda Klein, MD;  Location: [facility] CATH [facility];  Service: Cardiovascular;  Laterality: N/A;  . pacemaker implanted  05/11/97,08/02/05   [place]  . TONSILLECTOMY    . TOTAL ABDOMINAL HYSTERECTOMY W/ BILATERAL SALPINGOOPHORECTOMY       OB History   No obstetric history on file.     Family History  Problem Relation Age of Onset  . Pancreatic cancer Mother   . Diabetes Mother 17       type 2  . Heart attack Brother   . Diabetes Brother        type 2  . Gallbladder disease Father   . Diabetes Father        type 2  . Diabetes Brother        X 2   . Cancer Sister 80       skin ca on face  . Diabetes Sister        type 2  . Diabetes Daughter        type 2  . Hypertension Daughter   . Alcohol abuse Daughter   . Diabetes Son        type 2  . Diabetes Sister        type 1  . Hypertension Sister   . Stomach cancer Maternal Aunt   . Colon cancer Other     Social History   Tobacco Use  . Smoking status: Former Smoker    Packs/day: 2.00    Years: 6.00    Pack years: 12.00    Types: Cigarettes    Quit date: 10/15/1951    Years since quitting: 68.3  . Smokeless tobacco: Never Used  Substance Use Topics  . Alcohol use: No  . Drug use: No    Home Medications Prior to Admission medications   Medication Sig Start Date End Date Taking? Authorizing Provider  albuterol (PROVENTIL HFA;VENTOLIN HFA) 108 (90 Base) MCG/ACT inhaler Inhale 2 puffs into the lungs every 6 (six) hours as needed for wheezing or shortness of breath. 01/20/18   Mosie Lukes, MD  amiodarone  (PACERONE) 200 MG tablet Take 0.5 tablets (100 mg total) by mouth daily. 11/29/19   Croitoru, Mihai, MD  amoxicillin-clavulanate (AUGMENTIN) 250-125 MG tablet Take 1 tablet by mouth 2 (two) times daily. 02/09/20   Copland, Gay Filler, MD  atenolol (TENORMIN) 50 MG tablet Take 1.5  tablets (75 mg total) by mouth daily. 07/16/19   Mosie Lukes, MD  BENICAR HCT 20-12.5 MG tablet TAKE 1 TABLET DAILY 09/17/19   Mosie Lukes, MD  cephALEXin (KEFLEX) 500 MG capsule Take 1 capsule (500 mg total) by mouth 4 (four) times daily. 02/09/20   Fredia Sorrow, MD  cholecalciferol (VITAMIN D) 1000 UNITS tablet Take 1,000 Units by mouth daily.    [provider]  ELIQUIS 2.5 MG TABS tablet TAKE 1 TABLET TWICE A DAY 09/10/19   Croitoru, Mihai, MD  esomeprazole (NEXIUM) 20 MG capsule Take 1 capsule (20 mg total) by mouth daily at 12 noon. 05/03/19   Mosie Lukes, MD  furosemide (LASIX) 20 MG tablet TAKE 1 TABLET DAILY 08/18/17   Mosie Lukes, MD  gabapentin (NEURONTIN) 300 MG capsule TAKE 1 CAPSULE TWICE A DAY 08/27/19   Mosie Lukes, MD  MYRBETRIQ 50 MG TB24 tablet Take 1 tablet by mouth daily. 12/28/15   [provider]  polyvinyl alcohol (ARTIFICIAL TEARS) 1.4 % ophthalmic solution Place 1 drop into both eyes 3 (three) times daily as needed for dry eyes.    [provider]  pravastatin (PRAVACHOL) 40 MG tablet TAKE 1 TABLET DAILY 12/06/19   Mosie Lukes, MD  SYNTHROID 137 MCG tablet TAKE 1 TABLET DAILY 12/09/19   Mosie Lukes, MD  trimethoprim (TRIMPEX) 100 MG tablet Take 100 mg by mouth daily. 09/21/15   [provider]  valsartan-hydrochlorothiazide (DIOVAN-HCT) 80-12.5 MG tablet TAKE 1 TABLET DAILY 09/13/19   Mosie Lukes, MD    Allergies    Iodinated diagnostic agents, Cymbalta [duloxetine hcl], and Other  Review of Systems   Review of Systems  Constitutional: Negative for chills and fever.  HENT: Negative for congestion, rhinorrhea and sore throat.     Eyes: Negative for visual disturbance.  Respiratory: Negative for cough and shortness of breath.   Cardiovascular: Negative for chest pain and leg swelling.  Gastrointestinal: Negative for abdominal pain, diarrhea, nausea and vomiting.  Genitourinary: Negative for dysuria.  Musculoskeletal: Negative for back pain and neck pain.  Skin: Negative for rash.  Neurological: Positive for light-headedness. Negative for dizziness, syncope, facial asymmetry, speech difficulty, weakness, numbness and headaches.  Hematological: Does not bruise/bleed easily.  Psychiatric/Behavioral: Negative for confusion.    Physical Exam Updated Vital Signs BP (!) 158/77   Pulse 73   Temp 98.4 F (36.9 C) (Oral)   Resp (!) 21   SpO2 97%   Physical Exam Vitals and nursing note reviewed.  Constitutional:      General: She is not in acute distress.    Appearance: Normal appearance. She is well-developed.  HENT:     Head: Normocephalic and atraumatic.  Eyes:     Extraocular Movements: Extraocular movements intact.     Conjunctiva/sclera: Conjunctivae normal.     Pupils: Pupils are equal, round, and reactive to light.  Cardiovascular:     Rate and Rhythm: Normal rate and regular rhythm.     Heart sounds: No murmur.  Pulmonary:     Effort: Pulmonary effort is normal. No respiratory distress.     Breath sounds: Normal breath sounds.  Abdominal:     Palpations: Abdomen is soft.     Tenderness: There is no abdominal tenderness.  Musculoskeletal:        General: No swelling. Normal range of motion.     Cervical back: Normal range of motion and neck supple.  Skin:    General: Skin  is warm and dry.     Capillary Refill: Capillary refill takes less than 2 seconds.  Neurological:     General: No focal deficit present.     Mental Status: She is alert and oriented to person, place, and time.     Cranial Nerves: No cranial nerve deficit.     Sensory: No sensory deficit.     Motor: No weakness.      Coordination: Coordination normal.     Gait: Gait normal.     Comments: Patient ambulated without any difficulty.     ED Results / Procedures / Treatments   Labs (all labs ordered are listed, but only abnormal results are displayed) Labs Reviewed  COMPREHENSIVE METABOLIC PANEL - Abnormal; Notable for the following components:      Result Value   BUN 24 (*)    Creatinine, Ser 1.74 (*)    GFR calc non Af Amer 25 (*)    GFR calc Af Amer 29 (*)    All other components within normal limits  BRAIN NATRIURETIC PEPTIDE - Abnormal; Notable for the following components:   B Natriuretic Peptide 495.8 (*)    All other components within normal limits  URINALYSIS, ROUTINE W REFLEX MICROSCOPIC - Abnormal; Notable for the following components:   Hgb urine dipstick LARGE (*)    All other components within normal limits  URINALYSIS, MICROSCOPIC (REFLEX) - Abnormal; Notable for the following components:   Bacteria, UA RARE (*)    All other components within normal limits  URINE CULTURE  CBC WITH DIFFERENTIAL/PLATELET  TROPONIN I (HIGH SENSITIVITY)  TROPONIN I (HIGH SENSITIVITY)    EKG EKG Interpretation  Date/Time:  Wednesday February 09 2020 16:34:36 EDT Ventricular Rate:  70 PR Interval:    QRS Duration: 198 QT Interval:  496 QTC Calculation: 536 R Axis:   -77 Text Interpretation: AV dual-paced complexes No significant change since last tracing Confirmed by Fredia Sorrow [phone]) on 02/09/2020 4:39:04 PM   Radiology DG Chest Port 1 View  Result Date: 02/09/2020 CLINICAL DATA:  Near syncope. EXAM: PORTABLE CHEST 1 VIEW COMPARISON:  Chest radiograph 09/01/2015 FINDINGS: Multi lead pacer apparatus overlies the right hemithorax, leads are stable in position. Stable enlarged cardiac and mediastinal contours. Elevation of the right hemidiaphragm. No consolidative pulmonary opacities. No pleural effusion or pneumothorax. Thoracic spine degenerative changes. IMPRESSION: No acute cardiopulmonary  process. Electronically Signed   By: Lovey Newcomer M.D.   On: 02/09/2020 17:37    Procedures Procedures (including critical care time)  Medications Ordered in ED Medications  0.9 %  sodium chloride infusion ( Intravenous New Bag/Given 02/09/20 1830)  cefTRIAXone (ROCEPHIN) 1 g in sodium chloride 0.9 % 100 mL IVPB ( Intravenous Stopped 02/09/20 2110)    ED Course  I have reviewed the triage vital signs and the nursing notes.  Pertinent labs & imaging results that were available during my care of the patient were reviewed by me and considered in my medical decision making (see chart for details).    MDM Rules/Calculators/A&P                         Patient with extensive work-up here.  Not most important.  Patient's vital signs were good temperature was 98.3.  Heart rate 73 respiratory rate was 16 blood pressure 145/80.  Oxygen saturations were 98% on room air.  Patient did have her pacemaker interrogated.  No untoward events.  Pacemaker seem to be functioning properly so no  arrhythmias to explain the spells.  Patient has trouble explaining the spells.  The sound a little bit more like she gets lightheaded.  Gets a little funny feeling in the head.  No headache these are intermittent spells are always the same sometimes will occur 3 times in a row with a few seconds in between other times will be far spread out.  Patient's neuro exam here without any real acute findings.  Patient lab work-up without any significant abnormalities.  EKG consistent with a dual pacemaker.  Patient orthostatics without our static blood pressures.  Patient ambulated here without any symptoms walked excellent considering her age.  Possible there could have been a CVA event on "Sunday resulting in some intermittent things these do not sound like recurrent TIAs does not seem to fit the picture very well.  Patient denies any visual changes.  Denies any weakness or sensory deficits.  Neuro exam without any acute  findings.  However with everything else being negative would recommend follow back up with cardiology patient's battery on her pacemaker is getting low but is good for another 5 months.  We will have him just review everything she is on Pacerone.  Also would recommend outpatient MRI.  Patient's daughter will bring her back for anything new or worse.  There was concern about persistent urinary tract infection it appears that is not completely healed.  Patient given a dose of Rocephin 1 g here and a new prescription for Keflex.  Her primary care doctor is provided that.  But it was a bit of unusual dose of 250 mg he did have a tablet size so I switched to 100 mg every 6 hours for 7 days.  And urine culture sent again.  Patient will also follow back up with primary care provider as well as her cardiologist. Final Clinical Impression(s) / ED Diagnoses Final diagnoses:  Near syncope  Acute cystitis without hematuria    Rx / DC Orders ED Discharge Orders         Ordered    cephALEXin (KEFLEX) 500 MG capsule  4 times daily     04" /28/21 2147           Fredia Sorrow, MD 02/10/20 [phone]

## 2020-02-09 NOTE — Progress Notes (Addendum)
[facility] [place], [place], [place] [address] [phone]  Date:  02/09/2020   Name:  Jill Washington   DOB:  [birth year]   MRN:  [id number]  PCP:  Jill Lukes, MD    Chief Complaint: Dizziness (feels like she is going to faint, started sunday)   History of Present Illness:  Jill Washington is a 86 y.o. very pleasant female patient who presents with the following:  Patient who typically sees my partner Dr. Charlett Blake here today with concern of dizziness I have not seen her myself in the past They called into her heart doctor yesterday, at that time they decided that she likely had a vertigo as opposed to a cardiac issue and made an appointment to see [place] today  Patient has history of long term complete heart block with new pacemaker placed 2013, paroxysmal atrial fib, hypertension, hyperlipidemia  Amiodarone Atenolol Benicar HCT Eliquis 2.5 twice daily Lasix 20 Gabapentin Pravachol Synthroid Diovan HCT?  If she taking this and Benicar  Most recent labs on chart from July 2020  Today is Wednesday.  Since Sunday she has felt like she might faint.  She does not feel dizzy No syncope or pre-syncope Bending down might make her worse  No CP or SOB except for her baseline SOB that occurs with walking- this has been stable for years She was given an abx for bladder infection by UC just over a week ago  She was given abx and feels like it helped but she still has slight sx- she was treated with macrobid    No abd pain No vomiting No headache    Lab Results  Component Value Date   TSH 1.250 05/14/2019    Patient Active Problem List   Diagnosis Date Noted  . Renal insufficiency 05/25/2018  . Urinary frequency 05/25/2018  . Hyperkalemia 01/25/2018  . Abdominal pain 04/25/2017  . Diarrhea 04/25/2017  . SSS (sick sinus syndrome) ([facility]) 10/23/2016  . Knee pain, bilateral 05/31/2016  . Medicare annual  wellness visit, subsequent 05/15/2015  . CHB (complete heart block) ([place]) 11/10/2014  . Pedal edema 04/10/2014  . History of shingles   . Long term current use of anticoagulant therapy 01/30/2013  . Pacemaker -  [place] leads implanted in 1998 generator change 2006 and 2013 07/31/2011  . Back pain 05/07/2011  . Overactive bladder 05/07/2011  . B12 deficiency 01/22/2011  . Anemia 01/22/2011  . Irritable bowel syndrome 04/19/2010  . Vitamin D deficiency 10/17/2009  . Hypercholesterolemia 10/17/2009  . Critical illness polyneuropathy ([place]) 06/23/2008  . Atrial fibrillation ([place]) 06/23/2008  . Venous (peripheral) insufficiency 06/23/2008  . History of colon polyps 02/29/2008  . Hypothyroidism 02/29/2008  . PERIPHERAL NEUROPATHY 02/29/2008  . Essential hypertension 02/29/2008  . MITRAL VALVE PROLAPSE 02/29/2008  . History of sick sinus syndrome 02/29/2008  . Hiatal hernia with GERD 02/29/2008  . FIBROMYALGIA 02/29/2008  . SKIN CANCER, HX OF 02/29/2008    Past Medical History:  Diagnosis Date  . Arthritis   . Blood transfusion   . CHB (complete heart block) ([facility]) 11/10/2014  . Chicken pox as a child  . Colon polyps    hyperplastic  . Critical illness polyneuropathy ([facility])   . Diarrhea 04/25/2017  . Diverticulosis of sigmoid colon 02/05/2007   mild  . Fecal incontinence   . Fibromyalgia   . GERD (gastroesophageal reflux disease)   . H/O measles   .  H/O mumps   . Hiatal hernia   . History of atrial fibrillation   . History of chicken pox   . History of empyema of pleura   . History of gallstones   . History of skin cancer    face  . Hypercholesteremia   . Hypertension   . Hypothyroidism   . IBS (irritable bowel syndrome)   . Measles as a child  . Medicare annual wellness visit, subsequent 05/15/2015   Allergies verified: UTD   Immunization Status: Flu vaccine-- NA  Tdap-- 9-10 years ago per patient  PNA-- 09/30/07 (23)  Shingles-- 04/07/14   A/P:  Changes to [place], [place] or  Personal Hx: UTD Pap-- none recent  MMG--declines further MMgs (last 08/12/03 BI-RADS 1: Neg)  Bone Density-- 08/11/03 with Prescott Gum, MD- Normal, declines further testing CCS-- 05/07/12 with Silvano Rusk, MD- polyp removed- no   . Mitral valve prolapse   . Mumps as a child  . Overactive bladder 05/07/2011  . Pacemaker   . Peripheral neuropathy   . Shingles 8 yrs ago  . Sick sinus syndrome ([facility])   . Urinary incontinence   . Venous insufficiency   . Vitamin D deficiency     Past Surgical History:  Procedure Laterality Date  . APPENDECTOMY  1971  . CARDIAC CATHETERIZATION  05/10/1997   Normal coronaries, MVP  . CERVICAL SPINE SURGERY    . CHOLECYSTECTOMY  03/2005   by Dr. Dalbert Batman  . COLONOSCOPY  02/05/2007,02/05/05   Dr. Silvano Rusk  . ESOPHAGOGASTRODUODENOSCOPY  02/05/2005,01/22/90   Dr. Silvano Rusk, Dr. Rachelle Hora  . NM MYOCAR PERF WALL MOTION  08/24/2010   Normal  . PACEMAKER GENERATOR CHANGE N/A 02/25/2012   Procedure: PACEMAKER GENERATOR CHANGE;  Surgeon: Sanda Klein, MD;  Location: [facility];  Service: Cardiovascular;  Laterality: N/A;  . pacemaker implanted  05/11/97,08/02/05   [place]  . TONSILLECTOMY    . TOTAL ABDOMINAL HYSTERECTOMY W/ BILATERAL SALPINGOOPHORECTOMY      Social History   Tobacco Use  . Smoking status: Former Smoker    Packs/day: 2.00    Years: 6.00    Pack years: 12.00    Types: Cigarettes    Quit date: 10/15/1951    Years since quitting: 68.3  . Smokeless tobacco: Never Used  Substance Use Topics  . Alcohol use: No  . Drug use: No    Family History  Problem Relation Age of Onset  . Pancreatic cancer Mother   . Diabetes Mother 55       type 2  . Heart attack Brother   . Diabetes Brother        type 2  . Gallbladder disease Father   . Diabetes Father        type 2  . Diabetes Brother        X 2   . Cancer Sister 80       skin ca on face  . Diabetes Sister        type 2  . Diabetes Daughter        type 2  . Hypertension  Daughter   . Alcohol abuse Daughter   . Diabetes Son        type 2  . Diabetes Sister        type 1  . Hypertension Sister   . Stomach cancer Maternal Aunt   . Colon cancer Unknown     Allergies  Allergen Reactions  . Iodinated Diagnostic Agents Other (See  Comments)    Pt was not told what the reaction was  . Cymbalta [Duloxetine Hcl] Other (See Comments)    Could not think straight  . Other Itching    Dial Soap    Medication list has been reviewed and updated.  Current Outpatient Medications on File Prior to Visit  Medication Sig Dispense Refill  . albuterol (PROVENTIL HFA;VENTOLIN HFA) 108 (90 Base) MCG/ACT inhaler Inhale 2 puffs into the lungs every 6 (six) hours as needed for wheezing or shortness of breath. 1 Inhaler 1  . amiodarone (PACERONE) 200 MG tablet Take 0.5 tablets (100 mg total) by mouth daily. 45 tablet 1  . atenolol (TENORMIN) 50 MG tablet Take 1.5 tablets (75 mg total) by mouth daily. 135 tablet 3  . BENICAR HCT 20-12.5 MG tablet TAKE 1 TABLET DAILY 90 tablet 3  . cholecalciferol (VITAMIN D) 1000 UNITS tablet Take 1,000 Units by mouth daily.    Marland Kitchen ELIQUIS 2.5 MG TABS tablet TAKE 1 TABLET TWICE A DAY 180 tablet 2  . esomeprazole (NEXIUM) 20 MG capsule Take 1 capsule (20 mg total) by mouth daily at 12 noon. 90 capsule 1  . furosemide (LASIX) 20 MG tablet TAKE 1 TABLET DAILY 90 tablet 0  . gabapentin (NEURONTIN) 300 MG capsule TAKE 1 CAPSULE TWICE A DAY 180 capsule 3  . MYRBETRIQ 50 MG TB24 tablet Take 1 tablet by mouth daily.    . polyvinyl alcohol (ARTIFICIAL TEARS) 1.4 % ophthalmic solution Place 1 drop into both eyes 3 (three) times daily as needed for dry eyes.    . pravastatin (PRAVACHOL) 40 MG tablet TAKE 1 TABLET DAILY 90 tablet 3  . SYNTHROID 137 MCG tablet TAKE 1 TABLET DAILY 90 tablet 3  . trimethoprim (TRIMPEX) 100 MG tablet Take 100 mg by mouth daily.    . valsartan-hydrochlorothiazide (DIOVAN-HCT) 80-12.5 MG tablet TAKE 1 TABLET DAILY 90 tablet 3    No current facility-administered medications on file prior to visit.    Review of Systems:  As per HPI- otherwise negative.   Physical Examination: Vitals:   02/09/20 1530  Resp: 16  Temp: 98.3 F (36.8 C)  SpO2: 96%   Vitals:   02/09/20 1530  Weight: 170 lb (77.1 kg)  Height: 5\' 4"  (1.626 m)   Body mass index is 29.18 kg/m. Ideal Body Weight: Weight in (lb) to have BMI = 25: 145.3  GEN: no acute distress. HEENT: Atraumatic, Normocephalic.  Ears and Nose: No external deformity. CV: RRR, No M/G/R. No JVD. No thrill. No extra heart sounds. PULM: CTA B, no wheezes, crackles, rhonchi. No retractions. No resp. distress. No accessory muscle use. ABD: S, NT, ND, +BS. No rebound. No HSM. EXTR: No c/c/e. Mild edema of LE PSYCH: Normally interactive. Conversant.   See orthostatic VS  Assessment and Plan: Lightheaded - Plan: Urine Culture, POCT urinalysis dipstick  Acute cystitis without hematuria - Plan: Urine Culture, POCT urinalysis dipstick, amoxicillin-clavulanate (AUGMENTIN) 250-125 MG tablet  Benign essential microscopic hematuria - Plan: Urine Microscopic Only  Elderly lady here with concern of feeling lightheaded.  She feels as though she "might pass out".  Orthostatic vital signs are normal today.  She was seen in urgent care and treated for UTI about 8 days ago, however she was given Macrobid which I am concerned may not have worked given her low creatinine clearance.  UA today shows some blood, her urine is sent for culture and micro.  I have given her prescription for Augmentin.  Discussed options  with patient and her daughter.  I am not able to get stat labs at this point in the day, would not get lab work up until at least tomorrow.  After discussion, they will be seen in the ER for further evaluation this evening.  Appreciate your care of this nice patient  Moderate med decision making today  This visit occurred during the SARS-CoV-2 public health emergency.   Safety protocols were in place, including screening questions prior to the visit, additional usage of staff PPE, and extensive cleaning of exam room while observing appropriate contact time as indicated for disinfecting solutions.    Signed Jill Blinks, MD  addnd 4/29- message from pt daughter  Pt sent to ED by Dr. Lorelei Pont on 02/09/20...  Per ED recommendation  patient needs to have a Brain MRI.Marland Kitchen Patient was told to contact [facility] for referral.. Please call Rowan Blase her daughter back to let her know when referral is placed  [phone]  However pt has a pacemaker- we likely cannot do an MRI Called radiology who recommends non contrast CT head Called her daughter and let her know this plan.  She reports her mom seems better with abx; hopefully this was all just UTI   Addendum 5/3 Received her urine culture which was negative. MICRO NUMBER: [id number]   SPECIMEN QUALITY: Adequate   Sample Source NOT GIVEN   STATUS: FINAL   ISOLATE 1: Growth of mixed flora was isolated, suggesting probable contamination. No further testing will be performed. If clinically indicated, recollection using a method to minimize contamination, with prompt transfer to Urine Culture Transport Tube, is  recommended.    With negative urine culture and positive blood in urine she will need urology evaluation Her urine micro was positive for 21-50 RBCs per field  Called her daughter and left a detailed message on machine explained above situation.  I will place referral to urology

## 2020-02-09 NOTE — Discharge Instructions (Addendum)
Make an appointment to follow-up with your cardiologist.  Make an appointment to follow-up with your primary care doctor would recommend outpatient MRI brain.  Take the antibiotic Keflex as directed.  New prescription provided.  Return for any new or worse symptoms at all.  Interrogation of your pacemaker here today without any specific findings.  Urine recultured again here today.  Still appeared to show some evidence of urinary tract infection.

## 2020-02-10 ENCOUNTER — Telehealth: Payer: Self-pay

## 2020-02-10 ENCOUNTER — Telehealth: Payer: Self-pay | Admitting: Family Medicine

## 2020-02-10 ENCOUNTER — Ambulatory Visit (INDEPENDENT_AMBULATORY_CARE_PROVIDER_SITE_OTHER): Payer: Medicare Other | Admitting: *Deleted

## 2020-02-10 DIAGNOSIS — I495 Sick sinus syndrome: Secondary | ICD-10-CM

## 2020-02-10 LAB — CUP PACEART REMOTE DEVICE CHECK
Battery Remaining Longevity: 6 mo
Battery Remaining Percentage: 5 %
Battery Voltage: 2.66 V
Brady Statistic AP VP Percent: 99 %
Brady Statistic AP VS Percent: 1 %
Brady Statistic AS VP Percent: 1.3 %
Brady Statistic AS VS Percent: 1 %
Brady Statistic RA Percent Paced: 97 %
Brady Statistic RV Percent Paced: 99 %
Date Time Interrogation Session: 20210428165549
Implantable Lead Implant Date: 19980729
Implantable Lead Implant Date: 19980729
Implantable Lead Location: 753859
Implantable Lead Location: 753860
Implantable Pulse Generator Implant Date: 20130514
Lead Channel Impedance Value: 430 Ohm
Lead Channel Impedance Value: 430 Ohm
Lead Channel Pacing Threshold Amplitude: 0.5 V
Lead Channel Pacing Threshold Amplitude: 1.125 V
Lead Channel Pacing Threshold Pulse Width: 0.4 ms
Lead Channel Pacing Threshold Pulse Width: 0.4 ms
Lead Channel Sensing Intrinsic Amplitude: 1.4 mV
Lead Channel Sensing Intrinsic Amplitude: 12 mV
Lead Channel Setting Pacing Amplitude: 1.375
Lead Channel Setting Pacing Amplitude: 1.5 V
Lead Channel Setting Pacing Pulse Width: 0.4 ms
Lead Channel Setting Sensing Sensitivity: 4 mV
Pulse Gen Model: 2210
Pulse Gen Serial Number: 7338831

## 2020-02-10 LAB — URINALYSIS, MICROSCOPIC ONLY

## 2020-02-10 LAB — URINE CULTURE
MICRO NUMBER:: 10415902
SPECIMEN QUALITY:: ADEQUATE

## 2020-02-10 NOTE — Telephone Encounter (Signed)
Pt sent to ED by Dr. Lorelei Pont on 02/09/20...  Per ED recommendation  patient needs to have a Brain MRI.Marland Kitchen Patient was told to contact [facility] for referral.. Please call Rowan Blase her daughter back to let her know when referral is placed  [phone]

## 2020-02-10 NOTE — Telephone Encounter (Signed)
E with pt daughter.  Advised of device battery nearing ERI, updated device checks to monthly frequency for battery check.

## 2020-02-10 NOTE — Progress Notes (Signed)
PPM Remote  

## 2020-02-10 NOTE — Addendum Note (Signed)
Addended by: Lamar Blinks C on: 02/10/2020 01:02 PM   Modules accepted: Orders

## 2020-02-11 LAB — URINE CULTURE: Culture: NO GROWTH

## 2020-02-14 NOTE — Addendum Note (Signed)
Addended by: Lamar Blinks C on: 02/14/2020 05:54 PM   Modules accepted: Orders

## 2020-02-15 ENCOUNTER — Telehealth: Payer: Self-pay | Admitting: Family Medicine

## 2020-02-15 NOTE — Telephone Encounter (Signed)
Caller: Jill Washington  (Patient's  Daughter)  Call Back # [phone]  Jill Washington is returning a call from Dr. Lorelei Pont in regards to her mother's Urine Results. Per Dr Lorelei Pont, patient has blood in urine .   Patient's daughter would like to speak with provider for the next step.

## 2020-02-15 NOTE — Telephone Encounter (Signed)
Called daughter- pt actually is already seen at [facility] urology.  Suspect they are aware of her microhematuria but will print out and fax most recent note.   Pt has not yet been scheduled for CT head for some reason- I will check into this for them  Need to call her daughter with all info as pt can get confused  Rowan Blase  (Patient's  Daughter)  Call Back # [phone]   Marita [place]- can you please check on her head CT and cancel new referral to [facility] Mel Almond can you please call [facility] and find out who she sees there, and fax them most recnet office visit note from me?  Thank you both!!

## 2020-02-15 NOTE — Telephone Encounter (Signed)
Imaging called pt home # earlier but there was no answer or machine. I sent a message to imaging to call pts daughter for CT head.   Referral was uploaded to [facility] to [facility] this morning.

## 2020-02-17 DIAGNOSIS — R350 Frequency of micturition: Secondary | ICD-10-CM | POA: Diagnosis not present

## 2020-02-17 DIAGNOSIS — N3020 Other chronic cystitis without hematuria: Secondary | ICD-10-CM | POA: Diagnosis not present

## 2020-02-18 ENCOUNTER — Ambulatory Visit (HOSPITAL_BASED_OUTPATIENT_CLINIC_OR_DEPARTMENT_OTHER)
Admission: RE | Admit: 2020-02-18 | Discharge: 2020-02-18 | Disposition: A | Discharge status: Home / Self Care | Payer: Medicare Other | Source: Ambulatory Visit | Attending: Family Medicine | Admitting: Family Medicine

## 2020-02-18 ENCOUNTER — Other Ambulatory Visit: Payer: Self-pay

## 2020-02-18 DIAGNOSIS — R55 Syncope and collapse: Secondary | ICD-10-CM | POA: Diagnosis not present

## 2020-02-18 DIAGNOSIS — R42 Dizziness and giddiness: Secondary | ICD-10-CM | POA: Insufficient documentation

## 2020-02-21 ENCOUNTER — Telehealth: Payer: Self-pay | Admitting: Family Medicine

## 2020-02-21 NOTE — Telephone Encounter (Signed)
Called pt's daughter [name] to let her know CT head non- acute.  Will also send a letter with report  We discussed having her see a neurologist for continued "spells" which could represent a mini stroke.  Unfortunately we are not able to get an MRI for this patient.  [name] is not sure her mother would like to pursue this issue further at this time, she is almost 86 years old and may prefer not to seek any other intervention.  She will ask her and let us know if they desire a referral  CT Head Wo Contrast  Result Date: 02/19/2020 CLINICAL DATA:  Dizziness EXAM: CT HEAD WITHOUT CONTRAST TECHNIQUE: Contiguous axial images were obtained from the base of the skull through the vertex without intravenous contrast. COMPARISON:  None. FINDINGS: Brain: There is mild to moderate diffuse atrophy. There is no intracranial mass, hemorrhage, extra-axial fluid collection, or midline shift. There is patchy small vessel disease throughout the centra semiovale bilaterally. Mild small vessel disease is noted in the anterior limbs of the left internal and external capsules. No acute appearing infarct is demonstrable on noncontrast enhanced CT examination. Vascular: No hyperdense vessels. There is calcification in each carotid siphon region. Skull: The bony calvarium appears intact. Sinuses/Orbits: There is mucosal thickening in several ethmoid air cells. Other visualized paranasal sinuses are clear. Orbits appear symmetric bilaterally. Other: Visualized mastoid air cells are clear. IMPRESSION: Atrophy. Periventricular small vessel disease. Small vessel disease in the anterior limbs of the left internal and external capsules. No acute infarct demonstrable on noncontrast enhanced CT examination. No mass or hemorrhage. There are foci of arterial vascular calcification. There is mucosal thickening in several ethmoid air cells Electronically Signed   By: Lowella Grip III M.D.   On: 02/19/2020 10:54   DG Chest Port 1 View  Result  Date: 02/09/2020 CLINICAL DATA:  Near syncope. EXAM: PORTABLE CHEST 1 VIEW COMPARISON:  Chest radiograph 09/01/2015 FINDINGS: Multi lead pacer apparatus overlies the right hemithorax, leads are stable in position. Stable enlarged cardiac and mediastinal contours. Elevation of the right hemidiaphragm. No consolidative pulmonary opacities. No pleural effusion or pneumothorax. Thoracic spine degenerative changes. IMPRESSION: No acute cardiopulmonary process. Electronically Signed   By: Lovey Newcomer M.D.   On: 02/09/2020 17:37   CUP PACEART REMOTE DEVICE CHECK  Result Date: 02/10/2020 Scheduled remote reviewed. Normal device function.  Known PAF, on [place] according to previous reports, AF burden is 4.5% of the time.  The device estimates that the battery will reach the elective replacement indicator in 5.8 months. Sent to triage due low battery Next remote to be determined. Kathy Breach, RN, CCDS, [employer]

## 2020-03-01 ENCOUNTER — Encounter: Payer: Self-pay | Admitting: Neurology

## 2020-03-01 ENCOUNTER — Telehealth: Payer: Self-pay | Admitting: Family Medicine

## 2020-03-01 DIAGNOSIS — R55 Syncope and collapse: Secondary | ICD-10-CM

## 2020-03-01 DIAGNOSIS — R42 Dizziness and giddiness: Secondary | ICD-10-CM

## 2020-03-01 NOTE — Telephone Encounter (Signed)
Caller : Rowan Blase (daughter)  Call Back # [phone]  Subject: Neurologist    Patient's daughter [name]) requesting a call back from Dr Lorelei Pont in regards to referral to neurologist. [name] and [place] spoke last week and [name] is following up.

## 2020-03-01 NOTE — Telephone Encounter (Signed)
Jill Washington's Other Number # [phone]

## 2020-03-01 NOTE — Telephone Encounter (Signed)
Called [name] back- her mother would like to consult with neurology after all.  I will place a referral and have them call her daughter with appt details

## 2020-03-21 ENCOUNTER — Ambulatory Visit (INDEPENDENT_AMBULATORY_CARE_PROVIDER_SITE_OTHER): Payer: Medicare Other | Admitting: *Deleted

## 2020-03-21 DIAGNOSIS — I495 Sick sinus syndrome: Secondary | ICD-10-CM

## 2020-03-21 LAB — CUP PACEART REMOTE DEVICE CHECK
Battery Remaining Longevity: 1 mo
Battery Remaining Percentage: 1 %
Battery Voltage: 2.63 V
Brady Statistic AP VP Percent: 99 %
Brady Statistic AP VS Percent: 1 %
Brady Statistic AS VP Percent: 1.3 %
Brady Statistic AS VS Percent: 1 %
Brady Statistic RA Percent Paced: 97 %
Brady Statistic RV Percent Paced: 99 %
Date Time Interrogation Session: 20210607163036
Implantable Lead Implant Date: 19980729
Implantable Lead Implant Date: 19980729
Implantable Lead Location: 753859
Implantable Lead Location: 753860
Implantable Pulse Generator Implant Date: 20130514
Lead Channel Impedance Value: 360 Ohm
Lead Channel Impedance Value: 410 Ohm
Lead Channel Pacing Threshold Amplitude: 0.5 V
Lead Channel Pacing Threshold Amplitude: 1 V
Lead Channel Pacing Threshold Pulse Width: 0.4 ms
Lead Channel Pacing Threshold Pulse Width: 0.4 ms
Lead Channel Sensing Intrinsic Amplitude: 1.4 mV
Lead Channel Sensing Intrinsic Amplitude: 12 mV
Lead Channel Setting Pacing Amplitude: 1.25 V
Lead Channel Setting Pacing Amplitude: 1.5 V
Lead Channel Setting Pacing Pulse Width: 0.4 ms
Lead Channel Setting Sensing Sensitivity: 4 mV
Pulse Gen Model: 2210
Pulse Gen Serial Number: 7338831

## 2020-03-22 ENCOUNTER — Telehealth: Payer: Self-pay

## 2020-03-22 ENCOUNTER — Other Ambulatory Visit: Payer: Self-pay | Admitting: *Deleted

## 2020-03-22 MED ORDER — GABAPENTIN 300 MG PO CAPS
300.0000 mg | ORAL_CAPSULE | Freq: Two times a day (BID) | ORAL | 0 refills | Status: DC
Start: 2020-03-22 — End: 2020-03-22

## 2020-03-22 NOTE — Telephone Encounter (Signed)
Patient is out of gabapentin and is waiting on mail order and she needed 7 day supply sent in to local pharmacy.  Rx sent in.

## 2020-03-22 NOTE — Progress Notes (Signed)
Remote pacemaker transmission.   

## 2020-03-22 NOTE — Telephone Encounter (Signed)
Patient called in to speak with the nurse about her medication. Please give the patient a call back at [phone]

## 2020-04-14 ENCOUNTER — Ambulatory Visit (INDEPENDENT_AMBULATORY_CARE_PROVIDER_SITE_OTHER): Payer: Medicare Other | Admitting: *Deleted

## 2020-04-14 DIAGNOSIS — I495 Sick sinus syndrome: Secondary | ICD-10-CM

## 2020-04-14 LAB — CUP PACEART REMOTE DEVICE CHECK
Battery Remaining Longevity: 1 mo
Battery Remaining Percentage: 0.5 %
Battery Voltage: 2.62 V
Brady Statistic AP VP Percent: 99 %
Brady Statistic AP VS Percent: 1 %
Brady Statistic AS VP Percent: 1.3 %
Brady Statistic AS VS Percent: 1 %
Brady Statistic RA Percent Paced: 98 %
Brady Statistic RV Percent Paced: 99 %
Date Time Interrogation Session: 20210702020445
Implantable Lead Implant Date: 19980729
Implantable Lead Implant Date: 19980729
Implantable Lead Location: 753859
Implantable Lead Location: 753860
Implantable Pulse Generator Implant Date: 20130514
Lead Channel Impedance Value: 400 Ohm
Lead Channel Impedance Value: 430 Ohm
Lead Channel Pacing Threshold Amplitude: 0.375 V
Lead Channel Pacing Threshold Amplitude: 1 V
Lead Channel Pacing Threshold Pulse Width: 0.4 ms
Lead Channel Pacing Threshold Pulse Width: 0.4 ms
Lead Channel Sensing Intrinsic Amplitude: 1.4 mV
Lead Channel Sensing Intrinsic Amplitude: 12 mV
Lead Channel Setting Pacing Amplitude: 1.25 V
Lead Channel Setting Pacing Amplitude: 1.375
Lead Channel Setting Pacing Pulse Width: 0.4 ms
Lead Channel Setting Sensing Sensitivity: 4 mV
Pulse Gen Model: 2210
Pulse Gen Serial Number: 7338831

## 2020-04-18 NOTE — Progress Notes (Signed)
Remote pacemaker transmission.   

## 2020-04-27 DIAGNOSIS — N390 Urinary tract infection, site not specified: Secondary | ICD-10-CM | POA: Diagnosis not present

## 2020-04-27 DIAGNOSIS — R300 Dysuria: Secondary | ICD-10-CM | POA: Diagnosis not present

## 2020-05-05 NOTE — Progress Notes (Signed)
I connected with [name] today by telephone and verified that I am speaking with the correct person using two identifiers. Location patient: home Location provider: work Persons participating in the virtual visit: patient, Marine scientist.    I discussed the limitations, risks, security and privacy concerns of performing an evaluation and management service by telephone and the availability of in person appointments. I also discussed with the patient that there may be a patient responsible charge related to this service. The patient expressed understanding and verbally consented to this telephonic visit.    Interactive audio and video telecommunications were attempted between this provider and patient, however failed, due to patient having technical difficulties OR patient did not have access to video capability.  We continued and completed visit with audio only.  Some vital signs may be absent or patient reported.    Subjective:   Jill Washington is a 86 y.o. female who presents for Medicare Annual (Subsequent) preventive examination.  Review of Systems    Cardiac Risk Factors include: advanced age (>17men, >64 women);hypertension     Objective:     Advanced Directives 05/08/2020 02/09/2020 09/15/2017 01/02/2017 04/29/2016 08/01/2015 05/15/2015  Does Patient Have a Medical Advance Directive? No No No No No No No  Would patient like information on creating a medical advance directive? No - Patient declined - No - Patient declined - Yes - Educational materials given No - patient declined information Yes Higher education careers adviser given    Current Medications (verified) Outpatient Encounter Medications as of 05/08/2020  Medication Sig   albuterol (PROVENTIL HFA;VENTOLIN HFA) 108 (90 Base) MCG/ACT inhaler Inhale 2 puffs into the lungs every 6 (six) hours as needed for wheezing or shortness of breath.   amiodarone (PACERONE) 200 MG tablet Take 0.5 tablets (100 mg total) by mouth daily.    amoxicillin-clavulanate (AUGMENTIN) 250-125 MG tablet Take 1 tablet by mouth 2 (two) times daily.   atenolol (TENORMIN) 50 MG tablet Take 1.5 tablets (75 mg total) by mouth daily.   BENICAR HCT 20-12.5 MG tablet TAKE 1 TABLET DAILY   cholecalciferol (VITAMIN D) 1000 UNITS tablet Take 1,000 Units by mouth daily.   ELIQUIS 2.5 MG TABS tablet TAKE 1 TABLET TWICE A DAY   esomeprazole (NEXIUM) 20 MG capsule Take 1 capsule (20 mg total) by mouth daily at 12 noon.   furosemide (LASIX) 20 MG tablet TAKE 1 TABLET DAILY   gabapentin (NEURONTIN) 300 MG capsule TAKE 1 CAPSULE TWICE A DAY   MYRBETRIQ 50 MG TB24 tablet Take 1 tablet by mouth daily.   nitrofurantoin, macrocrystal-monohydrate, (MACROBID) 100 MG capsule Take 100 mg by mouth 2 (two) times daily.   polyvinyl alcohol (ARTIFICIAL TEARS) 1.4 % ophthalmic solution Place 1 drop into both eyes 3 (three) times daily as needed for dry eyes.   pravastatin (PRAVACHOL) 40 MG tablet TAKE 1 TABLET DAILY   SYNTHROID 137 MCG tablet TAKE 1 TABLET DAILY   trimethoprim (TRIMPEX) 100 MG tablet Take 100 mg by mouth daily.   valsartan-hydrochlorothiazide (DIOVAN-HCT) 80-12.5 MG tablet TAKE 1 TABLET DAILY   amoxicillin-clavulanate (AUGMENTIN) 875-125 MG tablet Take 1 tablet by mouth 2 (two) times daily. (Patient not taking: Reported on 05/08/2020)   [DISCONTINUED] cephALEXin (KEFLEX) 500 MG capsule Take 1 capsule (500 mg total) by mouth 4 (four) times daily.   No facility-administered encounter medications on file as of 05/08/2020.    Allergies (verified) Iodinated diagnostic agents, Cymbalta [duloxetine hcl], and Other   History: Past Medical History:  Diagnosis Date  Arthritis    Blood transfusion    CHB (complete heart block) ([place]) 11/10/2014   Chicken pox as a child   Colon polyps    hyperplastic   Critical illness polyneuropathy ([place])    Diarrhea 04/25/2017   Diverticulosis of sigmoid colon 02/05/2007   mild   Fecal  incontinence    Fibromyalgia    GERD (gastroesophageal reflux disease)    H/O measles    H/O mumps    Hiatal hernia    History of atrial fibrillation    History of chicken pox    History of empyema of pleura    History of gallstones    History of skin cancer    face   Hypercholesteremia    Hypertension    Hypothyroidism    IBS (irritable bowel syndrome)    Measles as a child   Medicare annual wellness visit, subsequent 05/15/2015   Allergies verified: UTD   Immunization Status: Flu vaccine-- NA  Tdap-- 9-10 years ago per patient  PNA-- 09/30/07 (23)  Shingles-- 04/07/14   A/P:  Changes to [place], [place] or Personal Hx: UTD Pap-- none recent  MMG--declines further MMgs (last 08/12/03 BI-RADS 1: Neg)  Bone Density-- 08/11/03 with Prescott Gum, MD- Normal, declines further testing CCS-- 05/07/12 with Silvano Rusk, MD- polyp removed- no    Mitral valve prolapse    Mumps as a child   Overactive bladder 05/07/2011   Pacemaker    Peripheral neuropathy    Shingles 8 yrs ago   Sick sinus syndrome ([place])    Urinary incontinence    Venous insufficiency    Vitamin D deficiency    Past Surgical History:  Procedure Laterality Date   APPENDECTOMY  1971   CARDIAC CATHETERIZATION  05/10/1997   Normal coronaries, MVP   CERVICAL SPINE SURGERY     CHOLECYSTECTOMY  03/2005   by Dr. Dalbert Batman   COLONOSCOPY  02/05/2007,02/05/05   Dr. Silvano Rusk   ESOPHAGOGASTRODUODENOSCOPY  02/05/2005,01/22/90   Dr. Silvano Rusk, Dr. Rachelle Hora   NM MYOCAR PERF WALL MOTION  08/24/2010   Normal   PACEMAKER GENERATOR CHANGE N/A 02/25/2012   Procedure: PACEMAKER GENERATOR CHANGE;  Surgeon: Sanda Klein, MD;  Location: [facility];  Service: Cardiovascular;  Laterality: N/A;   pacemaker implanted  05/11/97,08/02/05   [facility]   TONSILLECTOMY     TOTAL ABDOMINAL HYSTERECTOMY W/ BILATERAL SALPINGOOPHORECTOMY     Family History  Problem Relation Age of Onset   Pancreatic cancer Mother     Diabetes Mother 77       type 2   Heart attack Brother    Diabetes Brother        type 2   Gallbladder disease Father    Diabetes Father        type 2   Diabetes Brother        X 2    Cancer Sister 80       skin ca on face   Diabetes Sister        type 2   Diabetes Daughter        type 2   Hypertension Daughter    Alcohol abuse Daughter    Diabetes Son        type 2   Diabetes Sister        type 1   Hypertension Sister    Stomach cancer Maternal Aunt    Colon cancer Other    Social History   Socioeconomic History   Marital  status: Widowed    Spouse name: Not on file   Number of children: 3   Years of education: Not on file   Highest education level: Not on file  Occupational History   Occupation: retired    Fish farm manager: RETIRED  Tobacco Use   Smoking status: Former Smoker    Packs/day: 2.00    Years: 6.00    Pack years: 12.00    Types: Cigarettes    Quit date: 10/15/1951    Years since quitting: 68.6   Smokeless tobacco: Never Used  Substance and Sexual Activity   Alcohol use: No   Drug use: No   Sexual activity: Not on file  Other Topics Concern   Not on file  Social History Narrative   She is a widow, one son to daughters. She is retired.   Social Determinants of Health   Financial Resource Strain: Low Risk    Difficulty of Paying Living Expenses: Not hard at all  Food Insecurity: No Food Insecurity   Worried About Charity fundraiser in the Last Year: Never true   Rifle in the Last Year: Never true  Transportation Needs: No Transportation Needs   Lack of Transportation (Medical): No   Lack of Transportation (Non-Medical): No  Physical Activity:    Days of Exercise per Week:    Minutes of Exercise per Session:   Stress:    Feeling of Stress :   Social Connections:    Frequency of Communication with Friends and Family:    Frequency of Social Gatherings with Friends and Family:    Attends Religious  Services:    Active Member of Clubs or Organizations:    Attends Archivist Meetings:    Marital Status:     Tobacco Counseling Counseling given: Not Answered   Clinical Intake:     Pain : No/denies pain             Activities of Daily Living In your present state of health, do you have any difficulty performing the following activities: 05/08/2020  Hearing? Y  Comment has hearing aids  Vision? N  Difficulty concentrating or making decisions? Y  Walking or climbing stairs? Y  Dressing or bathing? N  Doing errands, shopping? Y  Comment no longer driving  Preparing Food and eating ? N  Using the Toilet? N  In the past six months, have you accidently leaked urine? Y  Do you have problems with loss of bowel control? N  Managing your Medications? N  Managing your Finances? N  Housekeeping or managing your Housekeeping? Y  Some recent data might be hidden    Patient Care Team: Mosie Lukes, MD as PCP - General (Family Medicine) Croitoru, Dani Gobble, MD as Consulting Physician (Cardiology) Lynnell Dike, OD as Consulting Physician (Optometry)  Indicate any recent Medical Services you may have received from other than [facility] providers in the past year (date may be approximate).     Assessment:   This is a routine wellness examination for [place].  Dietary issues and exercise activities discussed: Current Exercise Habits: The patient does not participate in regular exercise at present, Exercise limited by: None identified Diet (meal preparation, eat out, water intake, caffeinated beverages, dairy products, fruits and vegetables): well balanced  Goals     Maintain Independence     Patient Stated     Decrease knee pain and get back to regular activities.      Depression Screen PHQ 2/9 Scores  05/08/2020 09/15/2017 04/29/2016 04/28/2015 02/09/2013  PHQ - 2 Score 0 0 1 0 0    Fall Risk Fall Risk  05/08/2020 09/08/2019 09/15/2017 04/29/2016 04/28/2015  Falls  in the past year? 0 0 No Yes Yes  Comment - Emmi Telephone Survey: data to providers prior to load - - -  Number falls in past yr: 0 - - 2 or more 2 or more  Injury with Fall? 0 - - Yes No  Risk Factor Category  - - - High Fall Risk -  Risk for fall due to : - - - History of fall(s);Impaired balance/gait -  Risk for fall due to: Comment - - - Pt has rolling walker, but does not use because it is too heavy for her to lift in and out of her car and too big for use in her house. Encouraged PT eval, pt will consider and let Dr. Charlett Blake know at follow-up appt next week. -  Follow up Education provided;Falls prevention discussed - - Education provided;Falls prevention discussed;Follow up appointment -   Lives alone in trailer. Dtr lives almost next door and checks on her daily. Any stairs in or around the home? Yes  If so, are there any without handrails? No  Home free of loose throw rugs in walkways, pet beds, electrical cords, etc? Yes  Adequate lighting in your home to reduce risk of falls? Yes   ASSISTIVE DEVICES UTILIZED TO PREVENT FALLS:  Life alert? No  Use of a cane, walker or w/c? Yes  Grab bars in the bathroom? No  Shower chair or bench in shower? Yes  Elevated toilet seat or a handicapped toilet? No     Cognitive Function:   MMSE - Mini Mental State Exam 05/08/2020 09/15/2017 04/29/2016  Not completed: Refused - -  Orientation to time - 5 5  Orientation to Place - 5 5  Registration - 3 3  Attention/ Calculation - 5 5  Recall - 3 3  Language- name 2 objects - 2 2  Language- repeat - 1 1  Language- follow 3 step command - 3 3  Language- read & follow direction - 1 1  Write a sentence - 1 1  Copy design - 1 1  Total score - 30 30        Immunizations Immunization History  Administered Date(s) Administered   Influenza Split 07/17/2011, 07/31/2012, 07/28/2013   Influenza Whole 10/17/2009, 08/27/2010   Influenza, High Dose Seasonal PF 09/24/2018   Influenza,inj,Quad  PF,6+ Mos 08/08/2015   Influenza-Unspecified 07/04/2016   Pneumococcal Conjugate-13 10/30/2015   Pneumococcal Polysaccharide-23 09/30/2007   Tdap 10/30/2015   Zoster 04/07/2014    TDAP status: Up to date Pneumococcal vaccine status: Up to date Covid-19 vaccine status: Completed vaccines per pt. Does not recall exact dates.  Qualifies for Shingles Vaccine? Yes   Zostavax completed Yes     Screening Tests Health Maintenance  Topic Date Due   COVID-19 Vaccine (1) Never done   INFLUENZA VACCINE  05/14/2020   TETANUS/TDAP  10/29/2025   DEXA SCAN  Completed   PNA vac Low Risk Adult  Completed    Health Maintenance  Health Maintenance Due  Topic Date Due   COVID-19 Vaccine (1) Never done    Colorectal cancer screening: No longer required.  Declines mammogram and bone density scan today.    Additional Screening: Vision Screening: Recommended annual ophthalmology exams for early detection of glaucoma and other disorders of the eye. Is the patient up to date  with their annual eye exam?  Yes  per pt   Dental Screening: Recommended annual dental exams for proper oral hygiene  Community Resource Referral / Chronic Care Management: CRR required this visit?  No   CCM required this visit?  No      Plan:    Please schedule your next medicare wellness visit with me in 1 yr.  Continue to eat heart healthy diet (full of fruits, vegetables, whole grains, lean protein, water--limit salt, fat, and sugar intake) and increase physical activity as tolerated.  Continue doing brain stimulating activities (puzzles, reading, adult coloring books, staying active) to keep memory sharp.    I have personally reviewed and noted the following in the patients chart:    Medical and social history  Use of alcohol, tobacco or illicit drugs   Current medications and supplements  Functional ability and status  Nutritional status  Physical activity  Advanced  directives  List of other physicians  Hospitalizations, surgeries, and ER visits in previous 12 months  Vitals  Screenings to include cognitive, depression, and falls  Referrals and appointments  In addition, I have reviewed and discussed with patient certain preventive protocols, quality metrics, and best practice recommendations. A written personalized care plan for preventive services as well as general preventive health recommendations were provided to patient.   Due to this being a telephonic visit, the after visit summary with patients personalized plan was offered to patient via mail or my-chart. Patient declined at this time.  Naaman Plummer [place], [place]   05/08/2020

## 2020-05-08 ENCOUNTER — Encounter: Payer: Self-pay | Admitting: *Deleted

## 2020-05-08 ENCOUNTER — Other Ambulatory Visit: Payer: Self-pay

## 2020-05-08 ENCOUNTER — Ambulatory Visit (INDEPENDENT_AMBULATORY_CARE_PROVIDER_SITE_OTHER): Payer: Medicare Other | Admitting: *Deleted

## 2020-05-08 DIAGNOSIS — Z0000 Encounter for general adult medical examination without abnormal findings: Secondary | ICD-10-CM | POA: Diagnosis not present

## 2020-05-08 NOTE — Patient Instructions (Signed)
Please schedule your next medicare wellness visit with me in 1 yr.  Continue to eat heart healthy diet (full of fruits, vegetables, whole grains, lean protein, water--limit salt, fat, and sugar intake) and increase physical activity as tolerated.  Continue doing brain stimulating activities (puzzles, reading, adult coloring books, staying active) to keep memory sharp.    Jill Washington , Thank you for taking time to come for your Medicare Wellness Visit. I appreciate your ongoing commitment to your health goals. Please review the following plan we discussed and let me know if I can assist you in the future.   These are the goals we discussed: Goals    . Maintain Independence    . Patient Stated     Decrease knee pain and get back to regular activities.       This is a list of the screening recommended for you and due dates:  Health Maintenance  Topic Date Due  . COVID-19 Vaccine (1) Never done  . Flu Shot  05/14/2020  . Tetanus Vaccine  10/29/2025  . DEXA scan (bone density measurement)  Completed  . Pneumonia vaccines  Completed    Preventive Care 30 Years and Older, Female Preventive care refers to lifestyle choices and visits with your health care provider that can promote health and wellness. This includes:  A yearly physical exam. This is also called an annual well check.  Regular dental and eye exams.  Immunizations.  Screening for certain conditions.  Healthy lifestyle choices, such as diet and exercise. What can I expect for my preventive care visit? Physical exam Your health care provider will check:  Height and weight. These may be used to calculate body mass index (BMI), which is a measurement that tells if you are at a healthy weight.  Heart rate and blood pressure.  Your skin for abnormal spots. Counseling Your health care provider may ask you questions about:  Alcohol, tobacco, and drug use.  Emotional well-being.  Home and relationship  well-being.  Sexual activity.  Eating habits.  History of falls.  Memory and ability to understand (cognition).  Work and work Statistician.  Pregnancy and menstrual history. What immunizations do I need?  Influenza (flu) vaccine  This is recommended every year. Tetanus, diphtheria, and pertussis (Tdap) vaccine  You may need a Td booster every 10 years. Varicella (chickenpox) vaccine  You may need this vaccine if you have not already been vaccinated. Zoster (shingles) vaccine  You may need this after age 23. Pneumococcal conjugate (PCV13) vaccine  One dose is recommended after age 34. Pneumococcal polysaccharide (PPSV23) vaccine  One dose is recommended after age 34. Measles, mumps, and rubella (MMR) vaccine  You may need at least one dose of MMR if you were born in 1957 or later. You may also need a second dose. Meningococcal conjugate (MenACWY) vaccine  You may need this if you have certain conditions. Hepatitis A vaccine  You may need this if you have certain conditions or if you travel or work in places where you may be exposed to hepatitis A. Hepatitis B vaccine  You may need this if you have certain conditions or if you travel or work in places where you may be exposed to hepatitis B. Haemophilus influenzae type b (Hib) vaccine  You may need this if you have certain conditions. You may receive vaccines as individual doses or as more than one vaccine together in one shot (combination vaccines). Talk with your health care provider about the risks and  benefits of combination vaccines. What tests do I need? Blood tests  Lipid and cholesterol levels. These may be checked every 5 years, or more frequently depending on your overall health.  Hepatitis C test.  Hepatitis B test. Screening  Lung cancer screening. You may have this screening every year starting at age 66 if you have a 30-pack-year history of smoking and currently smoke or have quit within the past  15 years.  Colorectal cancer screening. All adults should have this screening starting at age 39 and continuing until age 61. Your health care provider may recommend screening at age 53 if you are at increased risk. You will have tests every 1-10 years, depending on your results and the type of screening test.  Diabetes screening. This is done by checking your blood sugar (glucose) after you have not eaten for a while (fasting). You may have this done every 1-3 years.  Mammogram. This may be done every 1-2 years. Talk with your health care provider about how often you should have regular mammograms.  BRCA-related cancer screening. This may be done if you have a family history of breast, ovarian, tubal, or peritoneal cancers. Other tests  Sexually transmitted disease (STD) testing.  Bone density scan. This is done to screen for osteoporosis. You may have this done starting at age 63. Follow these instructions at home: Eating and drinking  Eat a diet that includes fresh fruits and vegetables, whole grains, lean protein, and low-fat dairy products. Limit your intake of foods with high amounts of sugar, saturated fats, and salt.  Take vitamin and mineral supplements as recommended by your health care provider.  Do not drink alcohol if your health care provider tells you not to drink.  If you drink alcohol: ? Limit how much you have to 0-1 drink a day. ? Be aware of how much alcohol is in your drink. In the [place] one drink equals one 12 oz bottle of beer (355 mL), one 5 oz glass of wine (148 mL), or one 1 oz glass of hard liquor (44 mL). Lifestyle  Take daily care of your teeth and gums.  Stay active. Exercise for at least 30 minutes on 5 or more days each week.  Do not use any products that contain nicotine or tobacco, such as cigarettes, e-cigarettes, and chewing tobacco. If you need help quitting, ask your health care provider.  If you are sexually active, practice safe sex. Use a  condom or other form of protection in order to prevent STIs (sexually transmitted infections).  Talk with your health care provider about taking a low-dose aspirin or statin. What's next?  Go to your health care provider once a year for a well check visit.  Ask your health care provider how often you should have your eyes and teeth checked.  Stay up to date on all vaccines. This information is not intended to replace advice given to you by your health care provider. Make sure you discuss any questions you have with your health care provider. Document Revised: 09/24/2018 Document Reviewed: 09/24/2018 Elsevier Patient Education  2020 [employer].

## 2020-05-15 ENCOUNTER — Ambulatory Visit (INDEPENDENT_AMBULATORY_CARE_PROVIDER_SITE_OTHER): Payer: Medicare Other | Admitting: *Deleted

## 2020-05-15 DIAGNOSIS — I495 Sick sinus syndrome: Secondary | ICD-10-CM | POA: Diagnosis not present

## 2020-05-15 DIAGNOSIS — I442 Atrioventricular block, complete: Secondary | ICD-10-CM

## 2020-05-15 LAB — CUP PACEART REMOTE DEVICE CHECK
Battery Remaining Longevity: 1 mo
Battery Remaining Percentage: 0.5 %
Battery Voltage: 2.6 V
Brady Statistic AP VP Percent: 99 %
Brady Statistic AP VS Percent: 1 %
Brady Statistic AS VP Percent: 1.3 %
Brady Statistic AS VS Percent: 1 %
Brady Statistic RA Percent Paced: 98 %
Brady Statistic RV Percent Paced: 99 %
Date Time Interrogation Session: 20210802021448
Implantable Lead Implant Date: 19980729
Implantable Lead Implant Date: 19980729
Implantable Lead Location: 753859
Implantable Lead Location: 753860
Implantable Pulse Generator Implant Date: 20130514
Lead Channel Impedance Value: 360 Ohm
Lead Channel Impedance Value: 440 Ohm
Lead Channel Pacing Threshold Amplitude: 0.375 V
Lead Channel Pacing Threshold Amplitude: 1.125 V
Lead Channel Pacing Threshold Pulse Width: 0.4 ms
Lead Channel Pacing Threshold Pulse Width: 0.4 ms
Lead Channel Sensing Intrinsic Amplitude: 1.4 mV
Lead Channel Sensing Intrinsic Amplitude: 12 mV
Lead Channel Setting Pacing Amplitude: 1.375
Lead Channel Setting Pacing Amplitude: 1.375
Lead Channel Setting Pacing Pulse Width: 0.4 ms
Lead Channel Setting Sensing Sensitivity: 4 mV
Pulse Gen Model: 2210
Pulse Gen Serial Number: 7338831

## 2020-05-17 NOTE — Progress Notes (Signed)
Remote pacemaker transmission.   

## 2020-05-20 ENCOUNTER — Other Ambulatory Visit: Payer: Self-pay | Admitting: Cardiology

## 2020-05-22 NOTE — Telephone Encounter (Signed)
Prescription refill request for Eliquis received. Indication:  Atrial Fibrillation Last office visit: 03/21/2020 Croitoru Scr: 1.74  02/09/2020 Age: 86 Weight: 77.1 kg  Prescription refilled

## 2020-06-01 DIAGNOSIS — R300 Dysuria: Secondary | ICD-10-CM | POA: Diagnosis not present

## 2020-06-01 DIAGNOSIS — N3020 Other chronic cystitis without hematuria: Secondary | ICD-10-CM | POA: Diagnosis not present

## 2020-06-05 ENCOUNTER — Ambulatory Visit (INDEPENDENT_AMBULATORY_CARE_PROVIDER_SITE_OTHER): Payer: Medicare Other | Admitting: Neurology

## 2020-06-05 ENCOUNTER — Encounter: Payer: Self-pay | Admitting: Neurology

## 2020-06-05 ENCOUNTER — Other Ambulatory Visit: Payer: Self-pay

## 2020-06-05 VITALS — BP 127/76 | HR 80 | Ht 66.0 in | Wt 170.0 lb

## 2020-06-05 DIAGNOSIS — G629 Polyneuropathy, unspecified: Secondary | ICD-10-CM

## 2020-06-05 DIAGNOSIS — R42 Dizziness and giddiness: Secondary | ICD-10-CM

## 2020-06-05 NOTE — Progress Notes (Signed)
NEUROLOGY CONSULTATION NOTE  Jill Washington MRN: [id number] DOB: 1925-11-29  Referring provider: Dr. Lamar Blinks Primary care provider: Dr. [name]  Reason for consult:  Lightheadedness/presyncope  Dear Dr Lorelei Pont:  Thank you for your kind referral of Jill Washington for consultation of the above symptoms. Although her history is well known to you, please allow me to reiterate it for the purpose of our medical record. The patient was accompanied to the clinic by her daughter [name] who also provides collateral information. Records and images were personally reviewed where available.   HISTORY OF PRESENT ILLNESS: This is a pleasant 86 year old right-handed woman with a history of hypertension, hyperlipidemia, hypothyroidism, sick sinus syndrome s/p PPM, presenting for evaluation of lightheadedness/presyncope. She started having symptoms in April 2021 she she felt like she was going to faint. Bending down might make it worse. Sometimes it would occur three times in a row, other times once a day. She reports today that when she would change positions, she would feel dizzy, especially when turning on her right side in bed. She would be sitting on her chair watching TV and sometimes feel it. Everything would be going around and her vision sometimes would go dark for a 1-2 seconds. No loss of consciousness or falls due to these episodes.  Sometimes when waking up at night to use the bathroom, she sits up and feels dizzy. Orthostatic vital signs at PCP office were negative. She was brought to the ER where she had a CT head which I personally reviewed, no acute changes. There was chronic microvascular disease. She had a negative urine culture. She states that she was treated for a UTI just over a week prior to the dizziness. She denied any head injuries or URI symptoms. She reports that the dizziness has actually resolved, she had not had any further spells in over a month now. She denies any  headaches, dysarthria/dysphagia, bowel/bladder dysfunction. Her daughter denies any staring/unresponsive episodes. She has a cough and wheezes a little sometimes. She has numbness in her hands and feet from neuropathy.    PAST MEDICAL HISTORY: Past Medical History:  Diagnosis Date  . Arthritis   . Blood transfusion   . CHB (complete heart block) ([place]) 11/10/2014  . Chicken pox as a child  . Colon polyps    hyperplastic  . Critical illness polyneuropathy ([place])   . Diarrhea 04/25/2017  . Diverticulosis of sigmoid colon 02/05/2007   mild  . Fecal incontinence   . Fibromyalgia   . GERD (gastroesophageal reflux disease)   . H/O measles   . H/O mumps   . Hiatal hernia   . History of atrial fibrillation   . History of chicken pox   . History of empyema of pleura   . History of gallstones   . History of skin cancer    face  . Hypercholesteremia   . Hypertension   . Hypothyroidism   . IBS (irritable bowel syndrome)   . Measles as a child  . Medicare annual wellness visit, subsequent 05/15/2015   Allergies verified: UTD   Immunization Status: Flu vaccine-- NA  Tdap-- 9-10 years ago per patient  PNA-- 09/30/07 (23)  Shingles-- 04/07/14   A/P:  Changes to [place], [place] or Personal Hx: UTD Pap-- none recent  MMG--declines further MMgs (last 08/12/03 BI-RADS 1: Neg)  Bone Density-- 08/11/03 with Prescott Gum, MD- Normal, declines further testing CCS-- 05/07/12 with Silvano Rusk, MD- polyp removed- no   . Mitral  valve prolapse   . Mumps as a child  . Overactive bladder 05/07/2011  . Pacemaker   . Peripheral neuropathy   . Shingles 8 yrs ago  . Sick sinus syndrome ([place])   . Urinary incontinence   . Venous insufficiency   . Vitamin D deficiency     PAST SURGICAL HISTORY: Past Surgical History:  Procedure Laterality Date  . APPENDECTOMY  1971  . CARDIAC CATHETERIZATION  05/10/1997   Normal coronaries, MVP  . CERVICAL SPINE SURGERY    . CHOLECYSTECTOMY  03/2005   by Dr. Dalbert Batman  . COLONOSCOPY   02/05/2007,02/05/05   Dr. Silvano Rusk  . ESOPHAGOGASTRODUODENOSCOPY  02/05/2005,01/22/90   Dr. Silvano Rusk, Dr. Rachelle Hora  . NM MYOCAR PERF WALL MOTION  08/24/2010   Normal  . PACEMAKER GENERATOR CHANGE N/A 02/25/2012   Procedure: PACEMAKER GENERATOR CHANGE;  Surgeon: Sanda Klein, MD;  Location: [facility]ATH [facility];  Service: Cardiovascular;  Laterality: N/A;  . pacemaker implanted  05/11/97,08/02/05   [facility]  . TONSILLECTOMY    . TOTAL ABDOMINAL HYSTERECTOMY W/ BILATERAL SALPINGOOPHORECTOMY      MEDICATIONS: Current Outpatient Medications on File Prior to Visit  Medication Sig Dispense Refill  . amiodarone (PACERONE) 200 MG tablet Take 0.5 tablets (100 mg total) by mouth daily. 45 tablet 1  . amoxicillin-clavulanate (AUGMENTIN) 250-125 MG tablet Take 1 tablet by mouth 2 (two) times daily. 14 tablet 0  . amoxicillin-clavulanate (AUGMENTIN) 875-125 MG tablet Take 1 tablet by mouth 2 (two) times daily.     Marland Kitchen atenolol (TENORMIN) 50 MG tablet Take 1.5 tablets (75 mg total) by mouth daily. 135 tablet 3  . BENICAR HCT 20-12.5 MG tablet TAKE 1 TABLET DAILY 90 tablet 3  . cholecalciferol (VITAMIN D) 1000 UNITS tablet Take 1,000 Units by mouth daily.    Marland Kitchen ELIQUIS 2.5 MG TABS tablet TAKE 1 TABLET TWICE A DAY 180 tablet 1  . esomeprazole (NEXIUM) 20 MG capsule Take 1 capsule (20 mg total) by mouth daily at 12 noon. 90 capsule 1  . furosemide (LASIX) 20 MG tablet TAKE 1 TABLET DAILY 90 tablet 0  . gabapentin (NEURONTIN) 300 MG capsule TAKE 1 CAPSULE TWICE A DAY 180 capsule 3  . MYRBETRIQ 50 MG TB24 tablet Take 1 tablet by mouth daily.    . nitrofurantoin, macrocrystal-monohydrate, (MACROBID) 100 MG capsule Take 100 mg by mouth 2 (two) times daily.    . polyvinyl alcohol (ARTIFICIAL TEARS) 1.4 % ophthalmic solution Place 1 drop into both eyes 3 (three) times daily as needed for dry eyes.    . pravastatin (PRAVACHOL) 40 MG tablet TAKE 1 TABLET DAILY 90 tablet 3  . SYNTHROID 137 MCG tablet TAKE 1 TABLET  DAILY 90 tablet 3   No current facility-administered medications on file prior to visit.    ALLERGIES: Allergies  Allergen Reactions  . Iodinated Diagnostic Agents Other (See Comments)    Pt was not told what the reaction was  . Cymbalta [Duloxetine Hcl] Other (See Comments)    Could not think straight  . Other Itching    Dial Soap, (burns)    FAMILY HISTORY: Family History  Problem Relation Age of Onset  . Pancreatic cancer Mother   . Diabetes Mother 4       type 2  . Heart attack Brother   . Diabetes Brother        type 2  . Gallbladder disease Father   . Diabetes Father        type 2  .  Diabetes Brother        X 2   . Cancer Sister 80       skin ca on face  . Diabetes Sister        type 2  . Diabetes Daughter        type 2  . Hypertension Daughter   . Alcohol abuse Daughter   . Diabetes Son        type 2  . Diabetes Sister        type 1  . Hypertension Sister   . Stomach cancer Maternal Aunt   . Colon cancer Other     SOCIAL HISTORY: Social History   Socioeconomic History  . Marital status: Widowed    Spouse name: Not on file  . Number of children: 3  . Years of education: Not on file  . Highest education level: Not on file  Occupational History  . Occupation: retired    Fish farm manager: RETIRED  Tobacco Use  . Smoking status: Former Smoker    Packs/day: 2.00    Years: 6.00    Pack years: 12.00    Types: Cigarettes    Quit date: 10/15/1951    Years since quitting: 68.6  . Smokeless tobacco: Never Used  Vaping Use  . Vaping Use: Never used  Substance and Sexual Activity  . Alcohol use: No  . Drug use: No  . Sexual activity: Not on file  Other Topics Concern  . Not on file  Social History Narrative   She is a widow, one son to daughters. She is retired.   Social Determinants of Health   Financial Resource Strain: Low Risk   . Difficulty of Paying Living Expenses: Not hard at all  Food Insecurity: No Food Insecurity  . Worried About Paediatric nurse in the Last Year: Never true  . Ran Out of Food in the Last Year: Never true  Transportation Needs: No Transportation Needs  . Lack of Transportation (Medical): No  . Lack of Transportation (Non-Medical): No  Physical Activity:   . Days of Exercise per Week: Not on file  . Minutes of Exercise per Session: Not on file  Stress:   . Feeling of Stress : Not on file  Social Connections:   . Frequency of Communication with Friends and Family: Not on file  . Frequency of Social Gatherings with Friends and Family: Not on file  . Attends Religious Services: Not on file  . Active Member of Clubs or Organizations: Not on file  . Attends Archivist Meetings: Not on file  . Marital Status: Not on file  Intimate Partner Violence:   . Fear of Current or Ex-Partner: Not on file  . Emotionally Abused: Not on file  . Physically Abused: Not on file  . Sexually Abused: Not on file    PHYSICAL EXAM: Vitals:   06/05/20 1358  BP: 127/76  Pulse: 80  SpO2: 94%   General: No acute distress Head:  Normocephalic/atraumatic Skin/Extremities: No rash, no edema Neurological Exam: Mental status: alert and oriented to person, place, and time, no dysarthria or aphasia, Fund of knowledge is appropriate.  Recent and remote memory are intact.  Attention and concentration are normal.    Cranial nerves: CN I: not tested CN II: pupils equal, round and reactive to light, visual fields intact CN III, IV, VI:  full range of motion, no nystagmus, no ptosis CN V: facial sensation intact CN VII: upper and lower face symmetric  CN VIII: hearing intact to conversation Bulk & Tone: normal, no fasciculations. Motor: 5/5 throughout with no pronator drift. Sensation: intact to light touch, cold on both UE. Decreased cold, vibration sense to knees bilaterally.  Deep Tendon Reflexes: +1 throughout Cerebellar: no incoordination on finger to nose testing Gait: unsteady, slow and cautious with  cane Tremor: none  IMPRESSION: This is a pleasant 86 year old right-handed woman with a history of hypertension, hyperlipidemia, hypothyroidism, sick sinus syndrome s/p PPM, presenting for evaluation of lightheadedness/presyncope. Her neurological exam is non-focal with evidence of a length-dependent neuropathy. She reports today that symptoms in April consisted of a spinning sensation where she felt like she would pass out for 1-2 seconds, worse when she would turn to the right in bed, suggestive of positional vertigo. Head CT unremarkable. She has not had any further symptoms in over a month. If symptoms recur, they know to call our office or her PCP office for referral to Vestibular therapy. She was advised to use her walker due to gait instability from neuropathy. Follow-up prn, they know to call for any changes.   Thank you for allowing me to participate in the care of this patient. Please do not hesitate to call for any questions or concerns.   Ellouise Newer, M.D.  CC: Dr. Lorelei Pont, Dr. Charlett Blake

## 2020-06-05 NOTE — Patient Instructions (Signed)
Good to meet you! If the dizziness recurs, please call our office and we will order Vestibular therapy to put the small inner crystals back in place. Please use a walker at all times. Follow-up as needed, call for any changes.

## 2020-06-06 ENCOUNTER — Encounter: Payer: Self-pay | Admitting: Neurology

## 2020-06-06 DIAGNOSIS — L988 Other specified disorders of the skin and subcutaneous tissue: Secondary | ICD-10-CM | POA: Diagnosis not present

## 2020-06-06 DIAGNOSIS — L89610 Pressure ulcer of right heel, unstageable: Secondary | ICD-10-CM | POA: Diagnosis not present

## 2020-06-14 LAB — CUP PACEART REMOTE DEVICE CHECK
Battery Remaining Longevity: 1 mo
Battery Remaining Percentage: 0.5 %
Battery Voltage: 2.59 V
Brady Statistic AP VP Percent: 99 %
Brady Statistic AP VS Percent: 1 %
Brady Statistic AS VP Percent: 1.3 %
Brady Statistic AS VS Percent: 1 %
Brady Statistic RA Percent Paced: 97 %
Brady Statistic RV Percent Paced: 99 %
Date Time Interrogation Session: 20210901022546
Implantable Lead Implant Date: 19980729
Implantable Lead Implant Date: 19980729
Implantable Lead Location: 753859
Implantable Lead Location: 753860
Implantable Pulse Generator Implant Date: 20130514
Lead Channel Impedance Value: 360 Ohm
Lead Channel Impedance Value: 430 Ohm
Lead Channel Pacing Threshold Amplitude: 0.375 V
Lead Channel Pacing Threshold Amplitude: 1.25 V
Lead Channel Pacing Threshold Pulse Width: 0.4 ms
Lead Channel Pacing Threshold Pulse Width: 0.4 ms
Lead Channel Sensing Intrinsic Amplitude: 1.4 mV
Lead Channel Sensing Intrinsic Amplitude: 12 mV
Lead Channel Setting Pacing Amplitude: 1.375
Lead Channel Setting Pacing Amplitude: 1.5 V
Lead Channel Setting Pacing Pulse Width: 0.4 ms
Lead Channel Setting Sensing Sensitivity: 4 mV
Pulse Gen Model: 2210
Pulse Gen Serial Number: 7338831

## 2020-06-15 ENCOUNTER — Ambulatory Visit (INDEPENDENT_AMBULATORY_CARE_PROVIDER_SITE_OTHER): Payer: Medicare Other | Admitting: *Deleted

## 2020-06-15 DIAGNOSIS — I495 Sick sinus syndrome: Secondary | ICD-10-CM

## 2020-06-20 ENCOUNTER — Telehealth: Payer: Self-pay

## 2020-06-20 NOTE — Telephone Encounter (Signed)
Long overdue for a TSH. Please order that. If normal TSH, would recommend increasing tha amiodarone to 200 mg daily.

## 2020-06-20 NOTE — Progress Notes (Signed)
Remote pacemaker transmission.   

## 2020-06-20 NOTE — Telephone Encounter (Signed)
See if patient is going to be labs with cardiology or if she wants [place] to check TSH, CMP, CBC, lipid for hypothyroidism, hyperlipidemia, afib., arrange a lab appt if she is interested.

## 2020-06-20 NOTE — Telephone Encounter (Signed)
[facility] alert received 06/19/20 for A-fib/flutter with elevated ventricular rates, max. 120 bpm. with steady increase since mid of august 2021. Patient denies any symptoms. Reports compliance with medications Amio 100 mg daily, atenolol 75 mg daily, eliquis 2.5 mg BID. Battery is reaching ERI > 3 , monthly remote monitoring.  Routing to Dr. Sallyanne Kuster for review and recommendations.

## 2020-06-21 NOTE — Telephone Encounter (Signed)
Patient was called and will have daughter to call back to schedule appointment for labs.

## 2020-06-22 ENCOUNTER — Telehealth: Payer: Self-pay | Admitting: Emergency Medicine

## 2020-06-22 ENCOUNTER — Telehealth: Payer: Self-pay | Admitting: Cardiovascular Disease

## 2020-06-22 DIAGNOSIS — N3020 Other chronic cystitis without hematuria: Secondary | ICD-10-CM | POA: Diagnosis not present

## 2020-06-22 NOTE — Telephone Encounter (Signed)
Unable to reach patient and no voicemail. Contacted Karen(daughter) per DPR and notified her that pacemaker has reached ERI. Confirmed 07/13/20 appointment with Dr Sallyanne Kuster and informed he would schedule replacement at that time. Questions answered.

## 2020-06-22 NOTE — Telephone Encounter (Signed)
Thanks, will discuss at appt. Can we please tentatively schedule for Changeout on Oct 11?

## 2020-06-22 NOTE — Telephone Encounter (Signed)
New message:    Patient calling stating that  Some one called her. Please call patient.

## 2020-06-22 NOTE — Telephone Encounter (Signed)
Patient contacted and daughter answered phone and said patient is aware of need for gen change.

## 2020-06-27 ENCOUNTER — Encounter (HOSPITAL_COMMUNITY): Payer: Self-pay | Admitting: *Deleted

## 2020-06-27 ENCOUNTER — Inpatient Hospital Stay (HOSPITAL_COMMUNITY)
Admission: EM | Admit: 2020-06-27 | Discharge: 2020-07-03 | DRG: 258 | Disposition: A | Discharge status: Skilled Nursing Facility | Payer: Medicare Other | Attending: Cardiology | Admitting: Cardiology

## 2020-06-27 ENCOUNTER — Emergency Department (HOSPITAL_COMMUNITY): Payer: Medicare Other

## 2020-06-27 ENCOUNTER — Telehealth: Payer: Self-pay | Admitting: Cardiovascular Disease

## 2020-06-27 ENCOUNTER — Other Ambulatory Visit: Payer: Self-pay

## 2020-06-27 DIAGNOSIS — Z23 Encounter for immunization: Secondary | ICD-10-CM | POA: Diagnosis not present

## 2020-06-27 DIAGNOSIS — I959 Hypotension, unspecified: Secondary | ICD-10-CM | POA: Diagnosis not present

## 2020-06-27 DIAGNOSIS — E7800 Pure hypercholesterolemia, unspecified: Secondary | ICD-10-CM | POA: Diagnosis present

## 2020-06-27 DIAGNOSIS — Z45010 Encounter for checking and testing of cardiac pacemaker pulse generator [battery]: Secondary | ICD-10-CM | POA: Diagnosis not present

## 2020-06-27 DIAGNOSIS — I495 Sick sinus syndrome: Secondary | ICD-10-CM | POA: Diagnosis present

## 2020-06-27 DIAGNOSIS — N179 Acute kidney failure, unspecified: Secondary | ICD-10-CM

## 2020-06-27 DIAGNOSIS — I480 Paroxysmal atrial fibrillation: Secondary | ICD-10-CM | POA: Diagnosis not present

## 2020-06-27 DIAGNOSIS — N189 Chronic kidney disease, unspecified: Secondary | ICD-10-CM

## 2020-06-27 DIAGNOSIS — Z85828 Personal history of other malignant neoplasm of skin: Secondary | ICD-10-CM

## 2020-06-27 DIAGNOSIS — D649 Anemia, unspecified: Secondary | ICD-10-CM | POA: Diagnosis not present

## 2020-06-27 DIAGNOSIS — Z79890 Hormone replacement therapy (postmenopausal): Secondary | ICD-10-CM | POA: Diagnosis not present

## 2020-06-27 DIAGNOSIS — N1832 Chronic kidney disease, stage 3b: Secondary | ICD-10-CM

## 2020-06-27 DIAGNOSIS — Z79899 Other long term (current) drug therapy: Secondary | ICD-10-CM

## 2020-06-27 DIAGNOSIS — I509 Heart failure, unspecified: Secondary | ICD-10-CM | POA: Diagnosis not present

## 2020-06-27 DIAGNOSIS — I4891 Unspecified atrial fibrillation: Secondary | ICD-10-CM | POA: Diagnosis not present

## 2020-06-27 DIAGNOSIS — Z7901 Long term (current) use of anticoagulants: Secondary | ICD-10-CM | POA: Diagnosis not present

## 2020-06-27 DIAGNOSIS — N184 Chronic kidney disease, stage 4 (severe): Secondary | ICD-10-CM | POA: Diagnosis not present

## 2020-06-27 DIAGNOSIS — Z950 Presence of cardiac pacemaker: Secondary | ICD-10-CM

## 2020-06-27 DIAGNOSIS — I248 Other forms of acute ischemic heart disease: Secondary | ICD-10-CM | POA: Diagnosis not present

## 2020-06-27 DIAGNOSIS — E039 Hypothyroidism, unspecified: Secondary | ICD-10-CM | POA: Diagnosis present

## 2020-06-27 DIAGNOSIS — M6281 Muscle weakness (generalized): Secondary | ICD-10-CM | POA: Diagnosis not present

## 2020-06-27 DIAGNOSIS — M797 Fibromyalgia: Secondary | ICD-10-CM | POA: Diagnosis not present

## 2020-06-27 DIAGNOSIS — Z20822 Contact with and (suspected) exposure to covid-19: Secondary | ICD-10-CM | POA: Diagnosis not present

## 2020-06-27 DIAGNOSIS — R0902 Hypoxemia: Secondary | ICD-10-CM | POA: Diagnosis not present

## 2020-06-27 DIAGNOSIS — G629 Polyneuropathy, unspecified: Secondary | ICD-10-CM | POA: Diagnosis not present

## 2020-06-27 DIAGNOSIS — I442 Atrioventricular block, complete: Secondary | ICD-10-CM | POA: Diagnosis not present

## 2020-06-27 DIAGNOSIS — I5031 Acute diastolic (congestive) heart failure: Secondary | ICD-10-CM | POA: Insufficient documentation

## 2020-06-27 DIAGNOSIS — I4892 Unspecified atrial flutter: Secondary | ICD-10-CM | POA: Diagnosis not present

## 2020-06-27 DIAGNOSIS — R778 Other specified abnormalities of plasma proteins: Secondary | ICD-10-CM

## 2020-06-27 DIAGNOSIS — M792 Neuralgia and neuritis, unspecified: Secondary | ICD-10-CM | POA: Diagnosis not present

## 2020-06-27 DIAGNOSIS — R6889 Other general symptoms and signs: Secondary | ICD-10-CM | POA: Diagnosis not present

## 2020-06-27 DIAGNOSIS — N10 Acute tubulo-interstitial nephritis: Secondary | ICD-10-CM | POA: Diagnosis not present

## 2020-06-27 DIAGNOSIS — R0602 Shortness of breath: Secondary | ICD-10-CM | POA: Diagnosis not present

## 2020-06-27 DIAGNOSIS — I130 Hypertensive heart and chronic kidney disease with heart failure and stage 1 through stage 4 chronic kidney disease, or unspecified chronic kidney disease: Secondary | ICD-10-CM | POA: Diagnosis present

## 2020-06-27 DIAGNOSIS — Z743 Need for continuous supervision: Secondary | ICD-10-CM | POA: Diagnosis not present

## 2020-06-27 DIAGNOSIS — I110 Hypertensive heart disease with heart failure: Secondary | ICD-10-CM | POA: Diagnosis not present

## 2020-06-27 DIAGNOSIS — M79604 Pain in right leg: Secondary | ICD-10-CM | POA: Diagnosis not present

## 2020-06-27 DIAGNOSIS — H9190 Unspecified hearing loss, unspecified ear: Secondary | ICD-10-CM | POA: Diagnosis not present

## 2020-06-27 DIAGNOSIS — I10 Essential (primary) hypertension: Secondary | ICD-10-CM | POA: Diagnosis not present

## 2020-06-27 DIAGNOSIS — Z7401 Bed confinement status: Secondary | ICD-10-CM | POA: Diagnosis not present

## 2020-06-27 DIAGNOSIS — R41841 Cognitive communication deficit: Secondary | ICD-10-CM | POA: Diagnosis not present

## 2020-06-27 DIAGNOSIS — Z87891 Personal history of nicotine dependence: Secondary | ICD-10-CM

## 2020-06-27 DIAGNOSIS — I5033 Acute on chronic diastolic (congestive) heart failure: Secondary | ICD-10-CM | POA: Diagnosis present

## 2020-06-27 DIAGNOSIS — R079 Chest pain, unspecified: Secondary | ICD-10-CM | POA: Diagnosis not present

## 2020-06-27 DIAGNOSIS — R2681 Unsteadiness on feet: Secondary | ICD-10-CM | POA: Diagnosis not present

## 2020-06-27 DIAGNOSIS — R0789 Other chest pain: Secondary | ICD-10-CM | POA: Diagnosis not present

## 2020-06-27 DIAGNOSIS — I484 Atypical atrial flutter: Secondary | ICD-10-CM | POA: Diagnosis not present

## 2020-06-27 DIAGNOSIS — E559 Vitamin D deficiency, unspecified: Secondary | ICD-10-CM | POA: Diagnosis not present

## 2020-06-27 DIAGNOSIS — M2550 Pain in unspecified joint: Secondary | ICD-10-CM | POA: Diagnosis not present

## 2020-06-27 DIAGNOSIS — M170 Bilateral primary osteoarthritis of knee: Secondary | ICD-10-CM | POA: Diagnosis not present

## 2020-06-27 DIAGNOSIS — R531 Weakness: Secondary | ICD-10-CM | POA: Diagnosis not present

## 2020-06-27 DIAGNOSIS — E785 Hyperlipidemia, unspecified: Secondary | ICD-10-CM | POA: Diagnosis not present

## 2020-06-27 DIAGNOSIS — H04123 Dry eye syndrome of bilateral lacrimal glands: Secondary | ICD-10-CM | POA: Diagnosis not present

## 2020-06-27 DIAGNOSIS — M79605 Pain in left leg: Secondary | ICD-10-CM | POA: Diagnosis not present

## 2020-06-27 DIAGNOSIS — R278 Other lack of coordination: Secondary | ICD-10-CM | POA: Diagnosis not present

## 2020-06-27 DIAGNOSIS — N3281 Overactive bladder: Secondary | ICD-10-CM | POA: Diagnosis present

## 2020-06-27 DIAGNOSIS — R7989 Other specified abnormal findings of blood chemistry: Secondary | ICD-10-CM | POA: Diagnosis not present

## 2020-06-27 DIAGNOSIS — N3289 Other specified disorders of bladder: Secondary | ICD-10-CM | POA: Diagnosis not present

## 2020-06-27 DIAGNOSIS — K219 Gastro-esophageal reflux disease without esophagitis: Secondary | ICD-10-CM | POA: Diagnosis present

## 2020-06-27 DIAGNOSIS — G609 Hereditary and idiopathic neuropathy, unspecified: Secondary | ICD-10-CM | POA: Diagnosis not present

## 2020-06-27 LAB — BASIC METABOLIC PANEL
Anion gap: 11 (ref 5–15)
BUN: 19 mg/dL (ref 8–23)
CO2: 23 mmol/L (ref 22–32)
Calcium: 9.5 mg/dL (ref 8.9–10.3)
Chloride: 106 mmol/L (ref 98–111)
Creatinine, Ser: 1.89 mg/dL — ABNORMAL HIGH (ref 0.44–1.00)
GFR calc Af Amer: 26 mL/min — ABNORMAL LOW (ref >60)
GFR calc non Af Amer: 23 mL/min — ABNORMAL LOW (ref >60)
Glucose, Bld: 113 mg/dL — ABNORMAL HIGH (ref 70–99)
Potassium: 4.7 mmol/L (ref 3.5–5.1)
Sodium: 140 mmol/L (ref 135–145)

## 2020-06-27 LAB — CBC
HCT: 36.4 % (ref 36.0–46.0)
Hemoglobin: 11.3 g/dL — ABNORMAL LOW (ref 12.0–15.0)
MCH: 29.7 pg (ref 26.0–34.0)
MCHC: 31 g/dL (ref 30.0–36.0)
MCV: 95.5 fL (ref 80.0–100.0)
Platelets: 205 10*3/uL (ref 150–400)
RBC: 3.81 MIL/uL — ABNORMAL LOW (ref 3.87–5.11)
RDW: 12.4 % (ref 11.5–15.5)
WBC: 11 10*3/uL — ABNORMAL HIGH (ref 4.0–10.5)
nRBC: 0 % (ref 0.0–0.2)

## 2020-06-27 LAB — TSH: TSH: 4.508 u[IU]/mL — ABNORMAL HIGH (ref 0.350–4.500)

## 2020-06-27 LAB — TROPONIN I (HIGH SENSITIVITY)
Troponin I (High Sensitivity): 33 ng/L — ABNORMAL HIGH (ref <18)
Troponin I (High Sensitivity): 39 ng/L — ABNORMAL HIGH (ref <18)

## 2020-06-27 LAB — BRAIN NATRIURETIC PEPTIDE: B Natriuretic Peptide: 1310.8 pg/mL — ABNORMAL HIGH (ref 0.0–100.0)

## 2020-06-27 LAB — PROTIME-INR
INR: 1.3 — ABNORMAL HIGH (ref 0.8–1.2)
Prothrombin Time: 15.8 seconds — ABNORMAL HIGH (ref 11.4–15.2)

## 2020-06-27 LAB — SARS CORONAVIRUS 2 BY RT PCR (HOSPITAL ORDER, PERFORMED IN ~~LOC~~ HOSPITAL LAB): SARS Coronavirus 2: NEGATIVE

## 2020-06-27 LAB — MAGNESIUM: Magnesium: 1.8 mg/dL (ref 1.7–2.4)

## 2020-06-27 MED ORDER — IRBESARTAN 150 MG PO TABS
150.0000 mg | ORAL_TABLET | Freq: Every day | ORAL | Status: DC
  Administered 2020-06-27 – 2020-06-28 (×2): 150 mg via ORAL
  Filled 2020-06-27 (×2): qty 1

## 2020-06-27 MED ORDER — PRAVASTATIN SODIUM 40 MG PO TABS
40.0000 mg | ORAL_TABLET | Freq: Every day | ORAL | Status: DC
  Administered 2020-06-27 – 2020-07-03 (×7): 40 mg via ORAL
  Filled 2020-06-27 (×7): qty 1

## 2020-06-27 MED ORDER — SODIUM CHLORIDE 0.9% FLUSH
3.0000 mL | Freq: Two times a day (BID) | INTRAVENOUS | Status: DC
  Administered 2020-06-27 – 2020-07-02 (×7): 3 mL via INTRAVENOUS

## 2020-06-27 MED ORDER — ATENOLOL 25 MG PO TABS
75.0000 mg | ORAL_TABLET | Freq: Every day | ORAL | Status: DC
  Administered 2020-06-27 – 2020-06-30 (×4): 75 mg via ORAL
  Filled 2020-06-27: qty 1
  Filled 2020-06-27 (×4): qty 3

## 2020-06-27 MED ORDER — VITAMIN D 25 MCG (1000 UNIT) PO TABS
1000.0000 [IU] | ORAL_TABLET | Freq: Every day | ORAL | Status: DC
  Administered 2020-06-27 – 2020-07-03 (×7): 1000 [IU] via ORAL
  Filled 2020-06-27 (×7): qty 1

## 2020-06-27 MED ORDER — ACETAMINOPHEN 325 MG PO TABS
650.0000 mg | ORAL_TABLET | ORAL | Status: DC | PRN
  Administered 2020-06-29 – 2020-06-30 (×2): 650 mg via ORAL
  Filled 2020-06-27 (×2): qty 2

## 2020-06-27 MED ORDER — AMIODARONE HCL 100 MG PO TABS
100.0000 mg | ORAL_TABLET | Freq: Every day | ORAL | Status: DC
  Administered 2020-06-27 – 2020-07-03 (×7): 100 mg via ORAL
  Filled 2020-06-27 (×7): qty 1

## 2020-06-27 MED ORDER — GABAPENTIN 300 MG PO CAPS
300.0000 mg | ORAL_CAPSULE | Freq: Two times a day (BID) | ORAL | Status: DC
  Administered 2020-06-27 – 2020-07-03 (×12): 300 mg via ORAL
  Filled 2020-06-27 (×12): qty 1

## 2020-06-27 MED ORDER — MIRABEGRON ER 25 MG PO TB24
50.0000 mg | ORAL_TABLET | Freq: Every day | ORAL | Status: DC
  Administered 2020-06-27 – 2020-07-03 (×7): 50 mg via ORAL
  Filled 2020-06-27 (×2): qty 2
  Filled 2020-06-27: qty 1
  Filled 2020-06-27 (×4): qty 2

## 2020-06-27 MED ORDER — APIXABAN 2.5 MG PO TABS
2.5000 mg | ORAL_TABLET | Freq: Two times a day (BID) | ORAL | Status: DC
  Administered 2020-06-27 – 2020-07-03 (×12): 2.5 mg via ORAL
  Filled 2020-06-27 (×12): qty 1

## 2020-06-27 MED ORDER — SODIUM CHLORIDE 0.9 % IV SOLN: 250.0000 mL | INTRAVENOUS | Status: DC | PRN

## 2020-06-27 MED ORDER — POLYVINYL ALCOHOL 1.4 % OP SOLN
1.0000 [drp] | Freq: Three times a day (TID) | OPHTHALMIC | Status: DC | PRN
  Filled 2020-06-27: qty 15

## 2020-06-27 MED ORDER — FUROSEMIDE 10 MG/ML IJ SOLN
40.0000 mg | Freq: Once | INTRAMUSCULAR | Status: AC
  Administered 2020-06-27: 40 mg via INTRAVENOUS
  Filled 2020-06-27: qty 4

## 2020-06-27 MED ORDER — SODIUM CHLORIDE 0.9% FLUSH: 3.0000 mL | INTRAVENOUS | Status: DC | PRN

## 2020-06-27 MED ORDER — HYDROCHLOROTHIAZIDE 12.5 MG PO CAPS
12.5000 mg | ORAL_CAPSULE | Freq: Every day | ORAL | Status: DC
  Administered 2020-06-27: 12.5 mg via ORAL
  Filled 2020-06-27 (×2): qty 1

## 2020-06-27 MED ORDER — OLMESARTAN MEDOXOMIL-HCTZ 20-12.5 MG PO TABS: 1.0000 | ORAL_TABLET | Freq: Every day | ORAL | Status: DC

## 2020-06-27 MED ORDER — PANTOPRAZOLE SODIUM 40 MG PO TBEC
40.0000 mg | DELAYED_RELEASE_TABLET | Freq: Every day | ORAL | Status: DC
  Administered 2020-06-27 – 2020-07-03 (×7): 40 mg via ORAL
  Filled 2020-06-27 (×7): qty 1

## 2020-06-27 MED ORDER — ONDANSETRON HCL 4 MG/2ML IJ SOLN: 4.0000 mg | Freq: Four times a day (QID) | INTRAMUSCULAR | Status: DC | PRN

## 2020-06-27 MED ORDER — LEVOTHYROXINE SODIUM 25 MCG PO TABS
137.0000 ug | ORAL_TABLET | Freq: Every day | ORAL | Status: DC
  Administered 2020-06-28 – 2020-07-03 (×5): 137 ug via ORAL
  Filled 2020-06-27 (×5): qty 1

## 2020-06-27 NOTE — ED Notes (Signed)
Unable to obtain labs. Requested EMT to attempt.

## 2020-06-27 NOTE — ED Notes (Signed)
Pt was able to ambulate to bathroom and back to room with front wheel walkers similar to the one she uses at home.  Pt had difficult standing up from bed but otherwise able to ambulate independently.   While ambulating pt's SpO2 maintained above 93% and HR maintained at 73. After ambulating to and from bathroom HR increased to mid 130s. Pt verbalized feeling short of breath and chest pain that quickly resolved with rest

## 2020-06-27 NOTE — ED Triage Notes (Signed)
Pt arrived by [facility]. Hx of afib and has pacemaker. Pt felt like it wasn't working properly this morning due to cp and sob. Called and had it interrogated and was told it was working properly. spo2 pta was 94% but reports increase in sob with mild exertion.

## 2020-06-27 NOTE — Telephone Encounter (Signed)
Patient is called stating that she can't wait until her 9/30 appt with Dr. Sallyanne Kuster, she said her battery needs to be changed now.

## 2020-06-27 NOTE — ED Notes (Signed)
Difficulty obtaining IV d/t pt's scaring, bruising, and multiple warts. IV team consult placed

## 2020-06-27 NOTE — ED Notes (Signed)
Spoke to [employer] informed this RN no acute episodes today. Pt has been in continues A Fib since the beginning of this month. Battery will need to be replaced soon

## 2020-06-27 NOTE — ED Notes (Signed)
Completed Interrogated pacemaker

## 2020-06-27 NOTE — ED Provider Notes (Signed)
[facility] EMERGENCY DEPARTMENT Provider Note   CSN: [id number] Arrival date & time: 06/27/20  1004     History Chief Complaint  Patient presents with  . Shortness of Breath  . Chest Pain    Jill Washington is a 86 y.o. female with history of hypertension, hyperlipidemia, neuropathy, IBS, mitral valve prolapse, sick sinus syndrome status post pacemaker placement presents for evaluation of acute onset, intermittent palpitations for 2 weeks.  She reports that anytime that she gets up to walk even short distances she will feel her heart rate increase and she will feel very short of breath.  Symptoms last for approximately 10 minutes and will resolve with rest and time.  She will occasionally feel some substernal chest pain that radiates between the shoulder blades but has a difficult time describing when she experiences this.  She has chronic bilateral lower extremity edema which she reports is about baseline.  Denies fever, cough, abdominal pain, nausea, vomiting, lightheadedness or syncope.  She reports compliance with all of her medications.  She takes amiodarone Lasix, and Eliquis.  She is fully vaccinated against Covid.  She is concerned that her pacemaker is malfunctioning.  Telephone encounter from cardiologist this morning states that they may need to change her generator while she is here.   The history is provided by the patient and medical records.       Past Medical History:  Diagnosis Date  . Arthritis   . Blood transfusion   . CHB (complete heart block) ([place]) 11/10/2014  . Chicken pox as a child  . Colon polyps    hyperplastic  . Critical illness polyneuropathy ([place])   . Diarrhea 04/25/2017  . Diverticulosis of sigmoid colon 02/05/2007   mild  . Fecal incontinence   . Fibromyalgia   . GERD (gastroesophageal reflux disease)   . H/O measles   . H/O mumps   . Hiatal hernia   . History of atrial fibrillation   . History of chicken pox   . History of  empyema of pleura   . History of gallstones   . History of skin cancer    face  . Hypercholesteremia   . Hypertension   . Hypothyroidism   . IBS (irritable bowel syndrome)   . Measles as a child  . Medicare annual wellness visit, subsequent 05/15/2015   Allergies verified: UTD   Immunization Status: Flu vaccine-- NA  Tdap-- 9-10 years ago per patient  PNA-- 09/30/07 (23)  Shingles-- 04/07/14   A/P:  Changes to [place], [place] or Personal Hx: UTD Pap-- none recent  MMG--declines further MMgs (last 08/12/03 BI-RADS 1: Neg)  Bone Density-- 08/11/03 with Prescott Gum, MD- Normal, declines further testing CCS-- 05/07/12 with Silvano Rusk, MD- polyp removed- no   . Mitral valve prolapse   . Mumps as a child  . Overactive bladder 05/07/2011  . Pacemaker   . Peripheral neuropathy   . Shingles 8 yrs ago  . Sick sinus syndrome ([place])   . Urinary incontinence   . Venous insufficiency   . Vitamin D deficiency     Patient Active Problem List   Diagnosis Date Noted  . Renal insufficiency 05/25/2018  . Urinary frequency 05/25/2018  . Hyperkalemia 01/25/2018  . Abdominal pain 04/25/2017  . Diarrhea 04/25/2017  . SSS (sick sinus syndrome) ([place]) 10/23/2016  . Knee pain, bilateral 05/31/2016  . Medicare annual wellness visit, subsequent 05/15/2015  . CHB (complete heart block) ([place]) 11/10/2014  . Pedal edema 04/10/2014  .  History of shingles   . Long term current use of anticoagulant therapy 01/30/2013  . Pacemaker -  [place] leads implanted in 1998 generator change 2006 and 2013 07/31/2011  . Back pain 05/07/2011  . Overactive bladder 05/07/2011  . B12 deficiency 01/22/2011  . Anemia 01/22/2011  . Irritable bowel syndrome 04/19/2010  . Vitamin D deficiency 10/17/2009  . Hypercholesterolemia 10/17/2009  . Critical illness polyneuropathy ([place]) 06/23/2008  . Atrial fibrillation ([place]) 06/23/2008  . Venous (peripheral) insufficiency 06/23/2008  . History of colon polyps 02/29/2008  . Hypothyroidism  02/29/2008  . PERIPHERAL NEUROPATHY 02/29/2008  . Essential hypertension 02/29/2008  . MITRAL VALVE PROLAPSE 02/29/2008  . History of sick sinus syndrome 02/29/2008  . Hiatal hernia with GERD 02/29/2008  . FIBROMYALGIA 02/29/2008  . SKIN CANCER, HX OF 02/29/2008    Past Surgical History:  Procedure Laterality Date  . APPENDECTOMY  1971  . CARDIAC CATHETERIZATION  05/10/1997   Normal coronaries, MVP  . CERVICAL SPINE SURGERY    . CHOLECYSTECTOMY  03/2005   by Dr. Dalbert Batman  . COLONOSCOPY  02/05/2007,02/05/05   Dr. Silvano Rusk  . ESOPHAGOGASTRODUODENOSCOPY  02/05/2005,01/22/90   Dr. Silvano Rusk, Dr. Rachelle Hora  . NM MYOCAR PERF WALL MOTION  08/24/2010   Normal  . PACEMAKER GENERATOR CHANGE N/A 02/25/2012   Procedure: PACEMAKER GENERATOR CHANGE;  Surgeon: Sanda Klein, MD;  Location: [facility] CATH LAB;  Service: Cardiovascular;  Laterality: N/A;  . pacemaker implanted  05/11/97,08/02/05   [place]  . TONSILLECTOMY    . TOTAL ABDOMINAL HYSTERECTOMY W/ BILATERAL SALPINGOOPHORECTOMY       OB History   No obstetric history on file.     Family History  Problem Relation Age of Onset  . Pancreatic cancer Mother   . Diabetes Mother 50       type 2  . Heart attack Brother   . Diabetes Brother        type 2  . Gallbladder disease Father   . Diabetes Father        type 2  . Diabetes Brother        X 2   . Cancer Sister 80       skin ca on face  . Diabetes Sister        type 2  . Diabetes Daughter        type 2  . Hypertension Daughter   . Alcohol abuse Daughter   . Diabetes Son        type 2  . Diabetes Sister        type 1  . Hypertension Sister   . Stomach cancer Maternal Aunt   . Colon cancer Other     Social History   Tobacco Use  . Smoking status: Former Smoker    Packs/day: 2.00    Years: 6.00    Pack years: 12.00    Types: Cigarettes    Quit date: 10/15/1951    Years since quitting: 68.7  . Smokeless tobacco: Never Used  Vaping Use  . Vaping Use: Never used   Substance Use Topics  . Alcohol use: No  . Drug use: No    Home Medications Prior to Admission medications   Medication Sig Start Date End Date Taking? Authorizing Provider  amiodarone (PACERONE) 200 MG tablet Take 0.5 tablets (100 mg total) by mouth daily. 11/29/19  Yes Croitoru, Mihai, MD  atenolol (TENORMIN) 50 MG tablet Take 1.5 tablets (75 mg total) by mouth daily. 07/16/19  Yes  Mosie Lukes, MD  BENICAR HCT 20-12.5 MG tablet TAKE 1 TABLET DAILY Patient taking differently: Take 1 tablet by mouth daily.  09/17/19  Yes Mosie Lukes, MD  cholecalciferol (VITAMIN D) 1000 UNITS tablet Take 1,000 Units by mouth daily.   Yes [provider]  ELIQUIS 2.5 MG TABS tablet TAKE 1 TABLET TWICE A DAY Patient taking differently: Take 2.5 mg by mouth 2 (two) times daily.  05/22/20  Yes Leonie Man, MD  esomeprazole (NEXIUM) 20 MG capsule Take 1 capsule (20 mg total) by mouth daily at 12 noon. 05/03/19  Yes Mosie Lukes, MD  furosemide (LASIX) 20 MG tablet TAKE 1 TABLET DAILY Patient taking differently: Take 20 mg by mouth daily.  08/18/17  Yes Mosie Lukes, MD  gabapentin (NEURONTIN) 300 MG capsule TAKE 1 CAPSULE TWICE A DAY Patient taking differently: Take 300 mg by mouth 2 (two) times daily.  08/27/19  Yes Mosie Lukes, MD  MYRBETRIQ 50 MG TB24 tablet Take 1 tablet by mouth daily. 12/28/15  Yes [provider]  omeprazole (PRILOSEC) 40 MG capsule Take 40 mg by mouth daily.   Yes [provider]  polyvinyl alcohol (ARTIFICIAL TEARS) 1.4 % ophthalmic solution Place 1 drop into both eyes 3 (three) times daily as needed for dry eyes.   Yes [provider]  pravastatin (PRAVACHOL) 40 MG tablet TAKE 1 TABLET DAILY Patient taking differently: Take 40 mg by mouth daily.  12/06/19  Yes Mosie Lukes, MD  SYNTHROID 137 MCG tablet TAKE 1 TABLET DAILY Patient taking differently: Take 137 mcg by mouth daily before breakfast.  12/09/19  Yes Mosie Lukes, MD     Allergies    Iodinated diagnostic agents, Cymbalta [duloxetine hcl], and Other  Review of Systems   Review of Systems  Constitutional: Negative for chills and fever.  Respiratory: Positive for shortness of breath.   Cardiovascular: Positive for chest pain and leg swelling.  Gastrointestinal: Negative for abdominal pain, nausea and vomiting.  Skin: Positive for wound.  Neurological: Negative for syncope and light-headedness.  All other systems reviewed and are negative.   Physical Exam Updated Vital Signs BP (!) 186/84   Pulse 74   Temp 98.2 F (36.8 C) (Oral)   Resp (!) 23   Ht 5\' 5"  (1.651 m)   Wt 77.1 kg   SpO2 96%   BMI 28.29 kg/m   Physical Exam Vitals and nursing note reviewed.  Constitutional:      General: She is not in acute distress.    Appearance: She is well-developed.  HENT:     Head: Normocephalic and atraumatic.  Eyes:     General:        Right eye: No discharge.        Left eye: No discharge.     Conjunctiva/sclera: Conjunctivae normal.  Neck:     Vascular: No JVD.     Trachea: No tracheal deviation.  Cardiovascular:     Rate and Rhythm: Normal rate and regular rhythm.     Pulses: Normal pulses.     Heart sounds: Murmur heard.      Comments: 3+ pitting edema of bilateral lower extremities with chronic venous stasis skin changes noted.  There is a superficial wound to the anterior right lower leg.  No abnormal drainage or tenderness to palpation. Pulmonary:     Effort: Pulmonary effort is normal.     Breath sounds: Rales present.     Comments: Diffuse Rales  in the posterior lung fields, pacemaker to right chest wall. Abdominal:     General: There is no distension.     Palpations: Abdomen is soft.     Tenderness: There is no abdominal tenderness. There is no guarding.  Musculoskeletal:     Right lower leg: No tenderness. Edema present.     Left lower leg: No tenderness. Edema present.  Skin:    General: Skin is warm.     Findings: No  erythema.  Neurological:     Mental Status: She is alert.  Psychiatric:        Behavior: Behavior normal.     ED Results / Procedures / Treatments   Labs (all labs ordered are listed, but only abnormal results are displayed) Labs Reviewed  BASIC METABOLIC PANEL - Abnormal; Notable for the following components:      Result Value   Glucose, Bld 113 (*)    Creatinine, Ser 1.89 (*)    GFR calc non Af Amer 23 (*)    GFR calc Af Amer 26 (*)    All other components within normal limits  CBC - Abnormal; Notable for the following components:   WBC 11.0 (*)    RBC 3.81 (*)    Hemoglobin 11.3 (*)    All other components within normal limits  BRAIN NATRIURETIC PEPTIDE - Abnormal; Notable for the following components:   B Natriuretic Peptide 1,310.8 (*)    All other components within normal limits  TROPONIN I (HIGH SENSITIVITY) - Abnormal; Notable for the following components:   Troponin I (High Sensitivity) 33 (*)    All other components within normal limits  TROPONIN I (HIGH SENSITIVITY) - Abnormal; Notable for the following components:   Troponin I (High Sensitivity) 39 (*)    All other components within normal limits  SARS CORONAVIRUS 2 BY RT PCR [facility] ORDER, [place] LAB)    EKG EKG Interpretation  Date/Time:  Tuesday June 27 2020 10:07:39 EDT Ventricular Rate:  75 PR Interval:    QRS Duration: 202 QT Interval:  482 QTC Calculation: 538 R Axis:   -73 Text Interpretation: Ventricular-paced rhythm No significant change since last tracing Confirmed by Blanchie Dessert [phone]) on 06/27/2020 5:45:37 PM   Radiology DG Chest 2 View  Result Date: 06/27/2020 CLINICAL DATA:  Chest pain.  Shortness of breath. EXAM: CHEST - 2 VIEW COMPARISON:  02/09/2020 FINDINGS: Normal heart size and mediastinal contours. Dual-chamber pacer leads from the right in unremarkable position. Mild scarring in the right lung and costophrenic sulci. There is no edema,  consolidation, effusion, or pneumothorax. IMPRESSION: No acute finding when compared with prior. Electronically Signed   By: Monte Fantasia M.D.   On: 06/27/2020 11:15    Procedures Procedures (including critical care time)  Medications Ordered in ED Medications  furosemide (LASIX) injection 40 mg (has no administration in time range)    ED Course  I have reviewed the triage vital signs and the nursing notes.  Pertinent labs & imaging results that were available during my care of the patient were reviewed by me and considered in my medical decision making (see chart for details).    MDM Rules/Calculators/A&P                          Patient presenting for evaluation of progressively worsening dyspnea on exertion and palpitations/chest pain with ambulation or exertion.  She is afebrile in the ED, intermittently hypertensive and tachypneic.  SPO2 saturations within normal limits in the ED.  She does appear volume overloaded but states that her lower extremity edema is at baseline per her report.  She was ambulated in the ED with heart rate increasing into the 130s and appeared visibly short of breath.  Differential considerations include ACS/typical angina, cardiac arrhythmia, PE, CHF, among other potential etiologies.  Her cardiologist was made aware earlier today and there is a telephone encounter noting that she may need to have her generator replaced.  We will plan to obtain lab work, serial troponins, EKG, chest x-ray, and interrogate her pacemaker.  She is currently chest pain-free.  EKG shows paced rhythm.  Initial troponin mildly elevated at 33, second troponin bumped somewhat at 39.  Low suspicion of ACS/MI at this time, however considered demand ischemia in the setting of tachycardia with exertion.  Chest x-ray shows no acute findings however she has by lateral crackles on auscultation of the lungs concerning for potential volume overload.  Her BNP today is markedly elevated at  1310.8 concerning for CHF exacerbation.  We will give a dose of IV Lasix in the ED.     Remainder of lab work reviewed and interpreted by myself shows mild nonspecific leukocytosis, mild anemia near patient's baseline.  Mildly elevated creatinine but BUN is within normal limits, no metabolic derangements.  Pacemaker interrogation reveals that patient has been in A. fib since the beginning of this month and that her battery will need to be replaced soon.  CONSULT: Spoke with Dr. Meda Coffee with cardiology, discussed workup and findings.  Plan for cardiology admission for evaluation and management of A. fib, CHF, and need for pacemaker battery replacement.  Patient seen and evaluate by Dr. Maryan Rued who agrees with assessment and plan at this time Final Clinical Impression(s) / ED Diagnoses Final diagnoses:  Acute on chronic congestive heart failure, unspecified heart failure type [facility])  Elevated troponin  Pacemaker    Rx / DC Orders ED Discharge Orders    None       Debroah Baller 06/27/20 2034    Blanchie Dessert, MD 06/27/20 2256

## 2020-06-27 NOTE — Telephone Encounter (Signed)
Thanks. Will see and treat whatever this is (maybe she went into AFib?) and change her generator while she is here.

## 2020-06-27 NOTE — Telephone Encounter (Signed)
Patient called. Patient SOB with audible wheezing, unable to finish a complete sentence. Reports 7/10 chest pressure , pain in her lower back and abdomen. She was  in AF with VP 70's with device function WNL on the transmission from 06/26/20. Explained she has 1 month until ERI and battery of device not an issue. 911 called for patient with her permission. Stayed on phone with her until EMS arrived, report given. Daughter called with patient permission per DPR and message left on her home answering machine. No cell phone number available for family.

## 2020-06-27 NOTE — H&P (Signed)
Cardiology Admission History and Physical:   Patient ID: Jill [place] MRN: [id number]; DOB: 25-May-1926   Admission date: 06/27/2020  Primary Care Provider: Mosie Lukes, [place] [facility] Cardiologist: Dr Jill [place]  Chief Complaint:  SOB  History of Present Illness:   [name] is a 86 year old female with /o SSS, CHB (pacemaker dependent, dual-chamber permanent pacemaker - [employer]. Jude, 2013), paroxysmal atrial tachycardia and paroxysmal atrial fibrillation, hypertension, hyperlipidemia, treated hypothyroidism but no significant known structural heart disease.   She was last seen by Dr Jill [place] in 2019 - PM interrogation at that time showed 97% atrial pacing and 99% ventricular pacing.  She has occasional brief episodes of paroxysmal atrial tachycardia but no true atrial fibrillation has been reported since September 2019.  Lead parameters were normal.  She lives alone. She has noticed worsening SOB in the last couple of weeks, now at rest, worsening LE edema, PND, cough. History is hard to obtain as she is very hard of hearing. She states that her HR has been elevated lately and goes even higher with any activity. Her symptoms have been worsening with increased HR.   PM interrogation in the ER shows atrial fibrillation since the beginning of September and EOL for the PM battery.   Past Medical History:  Diagnosis Date  . Arthritis   . Blood transfusion   . CHB (complete heart block) ([place]) 11/10/2014  . Chicken pox as a child  . Colon polyps    hyperplastic  . Critical illness polyneuropathy ([place])   . Diarrhea 04/25/2017  . Diverticulosis of sigmoid colon 02/05/2007   mild  . Fecal incontinence   . Fibromyalgia   . GERD (gastroesophageal reflux disease)   . H/O measles   . H/O mumps   . Hiatal hernia   . History of atrial fibrillation   . History of chicken pox   . History of empyema of pleura   . History of gallstones   . History of skin cancer    face  .  Hypercholesteremia   . Hypertension   . Hypothyroidism   . IBS (irritable bowel syndrome)   . Measles as a child  . Medicare annual wellness visit, subsequent 05/15/2015   Allergies verified: UTD   Immunization Status: Flu vaccine-- NA  Tdap-- 9-10 years ago per patient  PNA-- 09/30/07 (23)  Shingles-- 04/07/14   A/P:  Changes to [place], [place] or Personal Hx: UTD Pap-- none recent  MMG--declines further MMgs (last 08/12/03 BI-RADS 1: Neg)  Bone Density-- 08/11/03 with Jill Gum, [place]- Normal, declines further testing CCS-- 05/07/12 with Jill Rusk, [place]- polyp removed- no   . Mitral valve prolapse   . Mumps as a child  . Overactive bladder 05/07/2011  . Pacemaker   . Peripheral neuropathy   . Shingles 8 yrs ago  . Sick sinus syndrome ([place])   . Urinary incontinence   . Venous insufficiency   . Vitamin D deficiency     Past Surgical History:  Procedure Laterality Date  . APPENDECTOMY  1971  . CARDIAC CATHETERIZATION  05/10/1997   Normal coronaries, MVP  . CERVICAL SPINE SURGERY    . CHOLECYSTECTOMY  03/2005   by Dr. Dalbert [place]  . COLONOSCOPY  02/05/2007,02/05/05   Dr. Silvano [place]  . ESOPHAGOGASTRODUODENOSCOPY  02/05/2005,01/22/90   Dr. Silvano [place], Dr. Rachelle [place]  . NM MYOCAR PERF WALL MOTION  08/24/2010   Normal  . PACEMAKER GENERATOR CHANGE N/A 02/25/2012   Procedure: PACEMAKER GENERATOR CHANGE;  Surgeon: Jill Klein, [place];  Location: [facility] CATH LAB;  Service: Cardiovascular;  Laterality: N/A;  . pacemaker implanted  05/11/97,08/02/05   [employer].Jude  . TONSILLECTOMY    . TOTAL ABDOMINAL HYSTERECTOMY W/ BILATERAL SALPINGOOPHORECTOMY       Medications Prior to Admission: Prior to Admission medications   Medication Sig Start Date End Date Taking? Authorizing Provider  amiodarone (PACERONE) 200 MG tablet Take 0.5 tablets (100 mg total) by mouth daily. 11/29/19  Yes Jill [place]  atenolol (TENORMIN) 50 MG tablet Take 1.5 tablets (75 mg total) by mouth daily. 07/16/19  Yes Jill Lukes, [place]    BENICAR HCT 20-12.5 MG tablet TAKE 1 TABLET DAILY Patient taking differently: Take 1 tablet by mouth daily.  09/17/19  Yes Jill Lukes, [place]  cholecalciferol (VITAMIN D) 1000 UNITS tablet Take 1,000 Units by mouth daily.   Yes Provider, Historical, [place]  ELIQUIS 2.5 MG TABS tablet TAKE 1 TABLET TWICE A DAY Patient taking differently: Take 2.5 mg by mouth 2 (two) times daily.  05/22/20  Yes Jill Man, [place]  esomeprazole (NEXIUM) 20 MG capsule Take 1 capsule (20 mg total) by mouth daily at 12 noon. 05/03/19  Yes Jill Lukes, [place]  furosemide (LASIX) 20 MG tablet TAKE 1 TABLET DAILY Patient taking differently: Take 20 mg by mouth daily.  08/18/17  Yes Jill Lukes, [place]  gabapentin (NEURONTIN) 300 MG capsule TAKE 1 CAPSULE TWICE A DAY Patient taking differently: Take 300 mg by mouth 2 (two) times daily.  08/27/19  Yes Jill Lukes, [place]  MYRBETRIQ 50 MG TB24 tablet Take 1 tablet by mouth daily. 12/28/15  Yes Provider, Historical, [place]  omeprazole (PRILOSEC) 40 MG capsule Take 40 mg by mouth daily.   Yes Provider, Historical, [place]  polyvinyl alcohol (ARTIFICIAL TEARS) 1.4 % ophthalmic solution Place 1 drop into both eyes 3 (three) times daily as needed for dry eyes.   Yes Provider, Historical, [place]  pravastatin (PRAVACHOL) 40 MG tablet TAKE 1 TABLET DAILY Patient taking differently: Take 40 mg by mouth daily.  12/06/19  Yes Jill Lukes, [place]  SYNTHROID 137 MCG tablet TAKE 1 TABLET DAILY Patient taking differently: Take 137 mcg by mouth daily before breakfast.  12/09/19  Yes Jill Lukes, [place]     Allergies:    Allergies  Allergen Reactions  . Iodinated Diagnostic Agents Other (See Comments)    Pt was not told what the reaction was  . Cymbalta [Duloxetine Hcl] Other (See Comments)    Could not think straight  . Other Itching    Dial Soap, (burns)    Social History:   Social History   Socioeconomic History  . Marital status: Widowed    Spouse name: Not on file  . Number of children: 3   . Years of education: Not on file  . Highest education level: Not on file  Occupational History  . Occupation: retired    Fish farm manager: RETIRED  Tobacco Use  . Smoking status: Former Smoker    Packs/day: 2.00    Years: 6.00    Pack years: 12.00    Types: Cigarettes    Quit date: 10/15/1951    Years since quitting: 68.7  . Smokeless tobacco: Never Used  Vaping Use  . Vaping Use: Never used  Substance and Sexual Activity  . Alcohol use: No  . Drug use: No  . Sexual activity: Not on file  Other Topics Concern  . Not on file  Social History Narrative  She is a widow, one son to daughters. She is retired.   Social Determinants of Health   Financial Resource Strain: Low Risk   . Difficulty of Paying Living Expenses: Not hard at all  Food Insecurity: No Food Insecurity  . Worried About Charity fundraiser in the Last Year: Never true  . Ran Out of Food in the Last Year: Never true  Transportation Needs: No Transportation Needs  . Lack of Transportation (Medical): No  . Lack of Transportation (Non-Medical): No  Physical Activity:   . Days of Exercise per Week: Not on file  . Minutes of Exercise per Session: Not on file  Stress:   . Feeling of Stress : Not on file  Social Connections:   . Frequency of Communication with Friends and Family: Not on file  . Frequency of Social Gatherings with Friends and Family: Not on file  . Attends Religious Services: Not on file  . Active Member of Clubs or Organizations: Not on file  . Attends Archivist Meetings: Not on file  . Marital Status: Not on file  Intimate Partner Violence:   . Fear of Current or Ex-Partner: Not on file  . Emotionally Abused: Not on file  . Physically Abused: Not on file  . Sexually Abused: Not on file    Family History:   The patient's family history includes Alcohol abuse in her daughter; Cancer (age of onset: 22) in her sister; Colon cancer in an other family member; Diabetes in her brother, brother,  daughter, father, sister, sister, and son; Diabetes (age of onset: 65) in her mother; Gallbladder disease in her father; Heart attack in her brother; Hypertension in her daughter and sister; Pancreatic cancer in her mother; Stomach cancer in her maternal aunt.    ROS:  Please see the history of present illness.  All other ROS reviewed and negative.     Physical Exam/Data:   Vitals:   06/27/20 1945 06/27/20 2045 06/27/20 2130 06/27/20 2230  BP: (!) 186/84 (!) 171/76 (!) 165/80   Pulse: 74 74 73 74  Resp: (!) 23 (!) 24 (!) 21 (!) 27  Temp:      TempSrc:      SpO2: 96% 95% 95% 93%  Weight:      Height:        Intake/Output Summary (Last 24 hours) at 06/27/2020 2251 Last data filed at 06/27/2020 2231 Gross per 24 hour  Intake --  Output 400 ml  Net -400 ml   Last 3 Weights 06/27/2020 06/05/2020 02/09/2020  Weight (lbs) 170 lb 170 lb 170 lb  Weight (kg) 77.111 kg 77.111 kg 77.111 kg     Body mass index is 28.29 kg/m.  General:  Well nourished, well developed, in no acute distress HEENT: normal Lymph: no adenopathy Neck: + 6 cm JVD B/L Endocrine:  No thryomegaly Vascular: No carotid bruits; FA pulses 2+ bilaterally without bruits  Cardiac:  normal S1, S2; RRR; no murmur  Lungs: crackes bilaterally, no wheezing, rhonchi or rales  Abd: soft, nontender, no hepatomegaly  Ext: chronic 2+ B/L edema up to the knees with chronic venous stasis skin changes Musculoskeletal:  No deformities, BUE and BLE strength normal and equal Skin: warm and dry  Neuro:  CNs 2-12 intact, no focal abnormalities noted Psych:  Normal affect   EKG:  The ECG that was done was personally reviewed and demonstrates V pacing  Relevant CV Studies: 2016   Left ventricle: The cavity size was normal.  There was mild  concentric hypertrophy. Systolic function was normal. The  estimated ejection fraction was in the range of 55% to 60%. Wall  motion was normal; there were no regional wall motion   abnormalities.  - Ventricular septum: Septal motion showed paradox.  - Mitral valve: Calcified annulus.  - Right ventricle: The cavity size was mildly dilated. Wall  thickness was normal.  - Right atrium: The atrium was mildly dilated.  Laboratory Data:  High Sensitivity Troponin:   Recent Labs  Lab 06/27/20 1043 06/27/20 1822  TROPONINIHS 33* 39*      Chemistry Recent Labs  Lab 06/27/20 1043  NA 140  K 4.7  CL 106  CO2 23  GLUCOSE 113*  BUN 19  CREATININE 1.89*  CALCIUM 9.5  GFRNONAA 23*  GFRAA 26*  ANIONGAP 11    No results for input(s): PROT, ALBUMIN, AST, ALT, ALKPHOS, BILITOT in the last 168 hours. Hematology Recent Labs  Lab 06/27/20 1043  WBC 11.0*  RBC 3.81*  HGB 11.3*  HCT 36.4  MCV 95.5  MCH 29.7  MCHC 31.0  RDW 12.4  PLT 205   BNP Recent Labs  Lab 06/27/20 1824  BNP 1,310.8*    DDimer No results for input(s): DDIMER in the last 168 hours.  Radiology/Studies:  DG Chest 2 View  Result Date: 06/27/2020 CLINICAL DATA:  Chest pain.  Shortness of breath. EXAM: CHEST - 2 VIEW COMPARISON:  02/09/2020 FINDINGS: Normal heart size and mediastinal contours. Dual-chamber pacer leads from the right in unremarkable position. Mild scarring in the right lung and costophrenic sulci. There is no edema, consolidation, effusion, or pneumothorax. IMPRESSION: No acute finding when compared with prior. Electronically Signed   By: Monte Fantasia M.D.   On: 06/27/2020 11:15   New York Heart Association (NYHA) Functional Class NYHA Class IV  Assessment and Plan:   Acute on chronic diastolic CHF  - Appears to be tachycardia mediated sec to atrial fibrillation with RVR  - BNP 1310, Crea 1.89, baseline 1.7 - 2.0  - We will continue lasix 40 mg iv BID, follow crea, K closely  PAF   - since beginning on September,   - on Eliquis, amiodarone, atenolol, Hb 11.3  - she will require cardioversion  CHB, PM dependent - nearing EOL and will probably require  replacement this hospital stay   Hypertension  - elevated today, she hasn't taken her meds, restart home irbesartan, atenolol, add another agent as needed if still hypertensive.   - hold home HCTZ while on lasix  Severity of Illness: The appropriate patient status for this patient is INPATIENT. Inpatient status is judged to be reasonable and necessary in order to provide the required intensity of service to ensure the patient's safety. The patient's presenting symptoms, physical exam findings, and initial radiographic and laboratory data in the context of their chronic comorbidities is felt to place them at high risk for further clinical deterioration. Furthermore, it is not anticipated that the patient will be medically stable for discharge from the hospital within 2 midnights of admission. The following factors support the patient status of inpatient.   " The patient's presenting symptoms include SOB. " The worrisome physical exam findings include lung crackles, LE edema. " The initial radiographic and laboratory data are worrisome because of acute CHF, a-fib with RVR. " The chronic co-morbidities include CHB.  * I certify that at the point of admission it is my clinical judgment that the patient will require inpatient hospital care spanning  beyond 2 midnights from the point of admission due to high intensity of service, high risk for further deterioration and high frequency of surveillance required.*   For questions or updates, please contact [place] Please consult [email or web address] for contact info under    Signed, Ena Dawley, [place]  06/27/2020 10:51 PM

## 2020-06-27 NOTE — Telephone Encounter (Signed)
Received call from [facility] with questions concerning patient meds and condition at time 911 was called. She reports that patient reported to her on arrival her pacemaker was "messed up". Reported to EMS that device is functioning WNL and patient has been in AF. Full report given and confirmed patient will be transported to [facility].

## 2020-06-28 ENCOUNTER — Other Ambulatory Visit: Payer: Self-pay | Admitting: Physician Assistant

## 2020-06-28 ENCOUNTER — Inpatient Hospital Stay (HOSPITAL_COMMUNITY): Payer: Medicare Other

## 2020-06-28 DIAGNOSIS — I5031 Acute diastolic (congestive) heart failure: Secondary | ICD-10-CM

## 2020-06-28 LAB — ECHOCARDIOGRAM COMPLETE
Area-P 1/2: 6.17 cm2
Height: 65 in
S' Lateral: 3.3 cm
Weight: 2761.92 oz

## 2020-06-28 LAB — BASIC METABOLIC PANEL
Anion gap: 10 (ref 5–15)
BUN: 22 mg/dL (ref 8–23)
CO2: 26 mmol/L (ref 22–32)
Calcium: 9.6 mg/dL (ref 8.9–10.3)
Chloride: 103 mmol/L (ref 98–111)
Creatinine, Ser: 1.8 mg/dL — ABNORMAL HIGH (ref 0.44–1.00)
GFR calc Af Amer: 28 mL/min — ABNORMAL LOW (ref >60)
GFR calc non Af Amer: 24 mL/min — ABNORMAL LOW (ref >60)
Glucose, Bld: 93 mg/dL (ref 70–99)
Potassium: 4.6 mmol/L (ref 3.5–5.1)
Sodium: 139 mmol/L (ref 135–145)

## 2020-06-28 MED ORDER — ENSURE ENLIVE PO LIQD
237.0000 mL | Freq: Two times a day (BID) | ORAL | Status: DC
  Administered 2020-06-28 – 2020-07-03 (×9): 237 mL via ORAL

## 2020-06-28 MED ORDER — PNEUMOCOCCAL VAC POLYVALENT 25 MCG/0.5ML IJ INJ
0.5000 mL | INJECTION | INTRAMUSCULAR | Status: DC
  Filled 2020-06-28: qty 0.5

## 2020-06-28 MED ORDER — ADULT MULTIVITAMIN W/MINERALS CH
1.0000 | ORAL_TABLET | Freq: Every day | ORAL | Status: DC
  Administered 2020-06-28 – 2020-07-03 (×6): 1 via ORAL
  Filled 2020-06-28 (×6): qty 1

## 2020-06-28 MED ORDER — INFLUENZA VAC A&B SA ADJ QUAD 0.5 ML IM PRSY
0.5000 mL | PREFILLED_SYRINGE | INTRAMUSCULAR | Status: AC
  Administered 2020-06-30: 0.5 mL via INTRAMUSCULAR
  Filled 2020-06-28: qty 0.5

## 2020-06-28 MED ORDER — FUROSEMIDE 10 MG/ML IJ SOLN
40.0000 mg | Freq: Once | INTRAMUSCULAR | Status: AC
  Administered 2020-06-28: 40 mg via INTRAVENOUS
  Filled 2020-06-28: qty 4

## 2020-06-28 NOTE — Discharge Instructions (Addendum)
Supplemental Discharge Instructions for  Pacemaker/Defibrillator Patients  Activity No specific restrictions. DO wear your seatbelt, even if it crosses over the pacemaker site.  WOUND CARE - Keep the wound area clean and dry.  Remove the dressing the day after you return home (usually 48 hours after the procedure). - DO NOT SUBMERGE UNDER WATER UNTIL FULLY HEALED (no tub baths, hot tubs, swimming pools, etc.).  - You  may shower or take a sponge bath after the dressing is removed. DO NOT SOAK the area and do not allow the shower to directly spray on the site. - If you have staples, these will be removed in the office in 7-14 days. - If you have tape/steri-strips on your wound, these will fall off; do not pull them off prematurely.   - No bandage is needed on the site.  DO  NOT apply any creams, oils, or ointments to the wound area. - If you notice any drainage or discharge from the wound, any swelling, excessive redness or bruising at the site, or if you develop a fever > 101? F after you are discharged home, call the office at once.  Special Instructions - You are still able to use cellular telephones.  Avoid carrying your cellular phone near your device. - When traveling through airports, show security personnel your identification card to avoid being screened in the metal detectors.  - Avoid arc welding equipment, MRI testing (magnetic resonance imaging), TENS units (transcutaneous nerve stimulators).  Call the office for questions about other devices. - Avoid electrical appliances that are in poor condition or are not properly grounded. - Microwave ovens are safe to be near or to operate.  _______________  Information on my medicine - ELIQUIS (apixaban)   Why was Eliquis prescribed for you? Eliquis was prescribed for you to reduce the risk of a blood clot forming that can cause a stroke if you have a medical condition called atrial fibrillation (a type of irregular  heartbeat).  What do You need to know about Eliquis ? Take your Eliquis TWICE DAILY - one tablet in the morning and one tablet in the evening with or without food. If you have difficulty swallowing the tablet whole please discuss with your pharmacist how to take the medication safely.  Take Eliquis exactly as prescribed by your doctor and DO NOT stop taking Eliquis without talking to the doctor who prescribed the medication.  Stopping may increase your risk of developing a stroke.  Refill your prescription before you run out.  After discharge, you should have regular check-up appointments with your healthcare provider that is prescribing your Eliquis.  In the future your dose may need to be changed if your kidney function or weight changes by a significant amount or as you get older.  What do you do if you miss a dose? If you miss a dose, take it as soon as you remember on the same day and resume taking twice daily.  Do not take more than one dose of ELIQUIS at the same time to make up a missed dose.  Important Safety Information A possible side effect of Eliquis is bleeding. You should call your healthcare provider right away if you experience any of the following: ? Bleeding from an injury or your nose that does not stop. ? Unusual colored urine (red or dark brown) or unusual colored stools (red or black). ? Unusual bruising for unknown reasons. ? A serious fall or if you hit your head (even if there  is no bleeding).  Some medicines may interact with Eliquis and might increase your risk of bleeding or clotting while on Eliquis. To help avoid this, consult your healthcare provider or pharmacist prior to using any new prescription or non-prescription medications, including herbals, vitamins, non-steroidal anti-inflammatory drugs (NSAIDs) and supplements.  This website has more information on Eliquis (apixaban): http://www.eliquis.com/eliquis/home _______________  Heart Failure  Education: 1. Weigh yourself EVERY morning after you go to the bathroom but before you eat or drink anything. Write this number down in a weight log/diary. If you gain 3 pounds overnight or 5 pounds in a week, call the office. 2. Take your medicines as prescribed. If you have concerns about your medications, please call us before you stop taking them.  3. Eat low salt foods--Limit salt (sodium) to 2000 mg per day. This will help prevent your body from holding onto fluid. Read food labels as many processed foods have a lot of sodium, especially canned goods and prepackaged meats. If you would like some assistance choosing low sodium foods, we would be happy to set you up with a nutritionist. 4. Stay as active as you can everyday. Staying active will give you more energy and make your muscles stronger. Start with 5 minutes at a time and work your way up to 30 minutes a day. Break up your activities--do some in the morning and some in the afternoon. Start with 3 days per week and work your way up to 5 days as you can.  If you have chest pain, feel short of breath, dizzy, or lightheaded, STOP. If you don't feel better after a short rest, call 911. If you do feel better, call the office to let us know you have symptoms with exercise. 5. Limit all fluids for the day to less than 2 liters. Fluid includes all drinks, coffee, juice, ice chips, soup, jello, and all other liquids.

## 2020-06-28 NOTE — Progress Notes (Addendum)
Initial Nutrition Assessment  DOCUMENTATION CODES:   Not applicable  INTERVENTION:    Ensure Enlive po BID, each supplement provides 350 kcal and 20 grams of protein.  MVI with minerals daily.  NUTRITION DIAGNOSIS:   Increased nutrient needs related to acute illness (with recent weight loss) as evidenced by estimated needs.  GOAL:   Patient will meet greater than or equal to 90% of their needs  MONITOR:   PO intake, Supplement acceptance, Weight trends  REASON FOR ASSESSMENT:   Malnutrition Screening Tool    ASSESSMENT:   86 yo female admitted with SOB, worsening LE edema, acute CHF. PMH includes SSS, CHB, pacemaker, PAF, paroxysmal atrial tachycardia, HTN, HLD.   Plans for cardioversion and will need pacemaker battery replacement. Unable to speak with patient at this time. Reported weight loss on admission, however, weight history does not show any weight loss. Current swelling/edema is likely masking actual weight loss. Patient is at increased nutrition risk given reported weight loss. Currently on a heart healthy diet; meal intakes not documented.  Labs reviewed. Creatinine 1.8  Medications reviewed and include cholecalciferol.  Usual weight 77.1 kg from 1 year ago.  RN documented +3 deep pitting edema to BLE today.  Diet Order:   Diet Order            Diet Heart Room service appropriate? Yes; Fluid consistency: Thin  Diet effective now                 EDUCATION NEEDS:   No education needs have been identified at this time  Skin:  Skin Assessment: Reviewed RN Assessment  Last BM:  no BM recorded  Height:   Ht Readings from Last 1 Encounters:  06/27/20 5\' 5"  (1.651 m)    Weight:   Wt Readings from Last 1 Encounters:  06/28/20 78.3 kg    Ideal Body Weight:  56.8 kg  BMI:  Body mass index is 28.73 kg/m.  Estimated Nutritional Needs:   Kcal:  1400-1600  Protein:  75-85 gm  Fluid:  >/= 1.4 L    Lucas Mallow, RD, LDN,  CNSC Please refer to Amion for contact information.

## 2020-06-28 NOTE — Plan of Care (Signed)
  Problem: Cardiac: Goal: Ability to achieve and maintain adequate cardiopulmonary perfusion will improve Outcome: Progressing   

## 2020-06-28 NOTE — Plan of Care (Signed)
?  Problem: Clinical Measurements: ?Goal: Will remain free from infection ?Outcome: Progressing ?  ?

## 2020-06-28 NOTE — Progress Notes (Signed)
  Mobility Specialist Criteria Algorithm Info.  SATURATION QUALIFICATIONS: (This note is used to comply with regulatory documentation for home oxygen)  Patient Saturations on Room Air at Rest = 95%  Patient Saturations on Room Air while Ambulating = 93%  Patient Saturations on 0 Liters of oxygen while Ambulating = n/a%  Please briefly explain why patient needs home oxygen:  Mobility Team:  [facility] elevated:Self regulated Activity: Ambulated in room;Transferred:  Bed to chair Range of motion: All extremities;Active Level of assistance: Minimal assist, patient does 75% or more Assistive device: Front wheel walker Minutes sitting in chair:  Minutes stood: 3 minutes Minutes ambulated: 3 minutes Distance ambulated (ft): 30 ft Mobility response: Tolerated well Bed Position: Chair ([place] chair)  Pt agreeable to participate in mobility this afternoon. Tolerated ambulating in room/hall well without any overt complaints other than being fatigued. Pt needed cues for hand placement on RW and proper walking mechanics. Now sitting in recliner chair with all needs met/call bell within reach.   HR Pre: 73 HR post: 77 BP Pre: 124/76  06/28/2020 3:21 PM

## 2020-06-28 NOTE — Progress Notes (Addendum)
Progress Note  Patient Name: Jill Washington Date of Encounter: 06/28/2020  [facility] Cardiologist: Sanda Klein, MD   Subjective   Breathing improving but not resolved. No chest pain.   Inpatient Medications    Scheduled Meds: . amiodarone  100 mg Oral Daily  . apixaban  2.5 mg Oral BID  . atenolol  75 mg Oral Daily  . cholecalciferol  1,000 Units Oral Daily  . feeding supplement (ENSURE ENLIVE)  237 mL Oral BID BM  . furosemide  40 mg Intravenous Once  . gabapentin  300 mg Oral BID  . [START ON 06/29/2020] influenza vaccine adjuvanted  0.5 mL Intramuscular Tomorrow-1000  . irbesartan  150 mg Oral Daily  . levothyroxine  137 mcg Oral QAC breakfast  . mirabegron ER  50 mg Oral Daily  . multivitamin with minerals  1 tablet Oral Daily  . pantoprazole  40 mg Oral Daily  . [START ON 06/29/2020] pneumococcal 23 valent vaccine  0.5 mL Intramuscular Tomorrow-1000  . pravastatin  40 mg Oral Daily  . sodium chloride flush  3 mL Intravenous Q12H   Continuous Infusions: . sodium chloride     PRN Meds: sodium chloride, acetaminophen, ondansetron (ZOFRAN) IV, polyvinyl alcohol, sodium chloride flush   Vital Signs    Vitals:   06/27/20 2230 06/27/20 2300 06/28/20 0429 06/28/20 0822  BP:  (!) 155/128 (!) 161/77 (!) 142/74  Pulse: 74 78 73 74  Resp: (!) 27 19 (!) 21 18  Temp:   97.6 F (36.4 C) 98 F (36.7 C)  TempSrc:   Oral Oral  SpO2: 93% 93% 93% 94%  Weight:   78.3 kg   Height:        Intake/Output Summary (Last 24 hours) at 06/28/2020 1021 Last data filed at 06/28/2020 0443 Gross per 24 hour  Intake --  Output 1900 ml  Net -1900 ml   Last 3 Weights 06/28/2020 06/27/2020 06/05/2020  Weight (lbs) 172 lb 9.9 oz 170 lb 170 lb  Weight (kg) 78.3 kg 77.111 kg 77.111 kg      Telemetry    Atrial fibrillation/Flutter at controlled rate  - Personally Reviewed  ECG    No new tracing   Physical Exam   GEN: No acute distress.   Neck: + JVD Cardiac: RRR, no  murmurs, rubs, or gallops.  Respiratory: Clear to auscultation bilaterally. GI: Soft, nontender, non-distended  MS: + edema with R leg wound ; No deformity. Neuro:  Nonfocal  Psych: Normal affect   Labs    High Sensitivity Troponin:   Recent Labs  Lab 06/27/20 1043 06/27/20 1822  TROPONINIHS 33* 39*      Chemistry Recent Labs  Lab 06/27/20 1043 06/28/20 0327  NA 140 139  K 4.7 4.6  CL 106 103  CO2 23 26  GLUCOSE 113* 93  BUN 19 22  CREATININE 1.89* 1.80*  CALCIUM 9.5 9.6  GFRNONAA 23* 24*  GFRAA 26* 28*  ANIONGAP 11 10     Hematology Recent Labs  Lab 06/27/20 1043  WBC 11.0*  RBC 3.81*  HGB 11.3*  HCT 36.4  MCV 95.5  MCH 29.7  MCHC 31.0  RDW 12.4  PLT 205    BNP Recent Labs  Lab 06/27/20 1824  BNP 1,310.8*      Radiology    DG Chest 2 View  Result Date: 06/27/2020 CLINICAL DATA:  Chest pain.  Shortness of breath. EXAM: CHEST - 2 VIEW COMPARISON:  02/09/2020 FINDINGS: Normal heart size and  mediastinal contours. Dual-chamber pacer leads from the right in unremarkable position. Mild scarring in the right lung and costophrenic sulci. There is no edema, consolidation, effusion, or pneumothorax. IMPRESSION: No acute finding when compared with prior. Electronically Signed   By: Monte Fantasia M.D.   On: 06/27/2020 11:15   ECHOCARDIOGRAM COMPLETE  Result Date: 06/28/2020    ECHOCARDIOGRAM REPORT   Patient Name:   Jill Washington Date of Exam: 06/28/2020 Medical Rec #:  [id number]         Height:       65.0 in Accession #:    [id number]        Weight:       172.6 lb Date of Birth:  1925/12/12          BSA:          1.858 m Patient Age:    86 years          BP:           161/77 mmHg Patient Gender: F                 HR:           79 bpm. Exam Location:  Inpatient Procedure: 2D Echo, Cardiac Doppler and Color Doppler Indications:    CHF-Acute Diastolic 793.90 / Z00.92  History:        Patient has prior history of Echocardiogram examinations, most                  recent 08/04/2015. Mitral Valve Prolapse, Arrythmias:Atrial                 Fibrillation; Risk Factors:Hypertension.  Sonographer:    Bernadene Person RDCS Referring Phys: [place]  1. Left ventricular ejection fraction, by estimation, is 60 to 65%. The left ventricle has normal function. The left ventricle has no regional wall motion abnormalities. Left ventricular diastolic parameters are indeterminate.  2. Right ventricular systolic function is normal. The right ventricular size is small. There is moderately elevated pulmonary artery systolic pressure.  3. Right atrial size was mildly dilated.  4. The mitral valve is myxomatous. Mild mitral valve regurgitation. There is mild prolapse of anterior leaflet of the mitral valve.  5. The aortic valve is normal in structure. Aortic valve regurgitation is not visualized. No aortic stenosis is present. FINDINGS  Left Ventricle: Left ventricular ejection fraction, by estimation, is 60 to 65%. The left ventricle has normal function. The left ventricle has no regional wall motion abnormalities. The left ventricular internal cavity size was normal in size. There is  no left ventricular hypertrophy. Left ventricular diastolic parameters are indeterminate. Right Ventricle: The right ventricular size is small. Right vetricular wall thickness was not well visualized. Right ventricular systolic function is normal. There is moderately elevated pulmonary artery systolic pressure. The tricuspid regurgitant velocity is 3.29 m/s, and with an assumed right atrial pressure of 3 mmHg, the estimated right ventricular systolic pressure is 33.0 mmHg. Left Atrium: Left atrial size was normal in size. Right Atrium: Right atrial size was mildly dilated. Pericardium: There is no evidence of pericardial effusion. Mitral Valve: The mitral valve is myxomatous. There is mild prolapse of anterior leaflet of the mitral valve. Mild mitral valve regurgitation. Tricuspid Valve: The  tricuspid valve is normal in structure. Tricuspid valve regurgitation is mild. Aortic Valve: The aortic valve is normal in structure. There is mild aortic valve annular calcification. Aortic valve regurgitation is not visualized. No  aortic stenosis is present. Pulmonic Valve: The pulmonic valve was grossly normal. Pulmonic valve regurgitation is not visualized. Aorta: The aortic root and ascending aorta are structurally normal, with no evidence of dilitation. IAS/Shunts: The atrial septum is grossly normal.  LEFT VENTRICLE PLAX 2D LVIDd:         4.60 cm  Diastology LVIDs:         3.30 cm  LV e' lateral:   9.00 cm/s LV PW:         1.00 cm  LV E/e' lateral: 12.8 LV IVS:        1.10 cm LVOT diam:     2.00 cm LV SV:         63 LV SV Index:   34 LVOT Area:     3.14 cm  RIGHT VENTRICLE RV S prime:     8.60 cm/s TAPSE (M-mode): 1.1 cm LEFT ATRIUM             Index       RIGHT ATRIUM           Index LA diam:        3.30 cm 1.78 cm/m  RA Area:     18.60 cm LA Vol (A2C):   45.1 ml 24.27 ml/m RA Volume:   43.90 ml  23.63 ml/m LA Vol (A4C):   42.7 ml 22.98 ml/m LA Biplane Vol: 46.7 ml 25.13 ml/m  AORTIC VALVE LVOT Vmax:   94.20 cm/s LVOT Vmean:  70.600 cm/s LVOT VTI:    0.201 m  AORTA Ao Root diam: 3.30 cm Ao Asc diam:  3.20 cm MITRAL VALVE                TRICUSPID VALVE MV Area (PHT): 6.17 cm     TR Peak grad:   43.3 mmHg MV Decel Time: 123 msec     TR Vmax:        329.00 cm/s MV E velocity: 115.00 cm/s MV A velocity: 31.20 cm/s   SHUNTS MV E/A ratio:  3.69         Systemic VTI:  0.20 m                             Systemic Diam: 2.00 cm Mertie Moores MD Electronically signed by Mertie Moores MD Signature Date/Time: 06/28/2020/10:18:42 AM    Final     Cardiac Studies   Echo 06/28/20 1. Left ventricular ejection fraction, by estimation, is 60 to 65%. The  left ventricle has normal function. The left ventricle has no regional  wall motion abnormalities. Left ventricular diastolic parameters are  indeterminate.    2. Right ventricular systolic function is normal. The right ventricular  size is small. There is moderately elevated pulmonary artery systolic  pressure.  3. Right atrial size was mildly dilated.  4. The mitral valve is myxomatous. Mild mitral valve regurgitation. There  is mild prolapse of anterior leaflet of the mitral valve.  5. The aortic valve is normal in structure. Aortic valve regurgitation is  not visualized. No aortic stenosis is present.   Patient Profile     86 y.o. female with h/o SSS, CHB (pacemaker dependent, dual-chamber permanent pacemaker - St. [phone]), paroxysmal atrial tachycardia and paroxysmal atrial fibrillation, hypertension, hyperlipidemia, treated hypothyroidism but no significant known structural heart disease presented for worsening dyspnea and LE edema. Also noted palpitations.   PM interrogation in the ER shows atrial fibrillation since the beginning  of September and EOL for the PM battery.   Assessment & Plan    1. Acute on chronic diastolic CHF - Likely due to afib RVR - BNP > 1000 - Diuresed 1.9L overnight with improved breathing - Will give Lasix 40mg  x 1 today. HCTZ on hold - Echo with preserved LVEF. No WM abnormality  2. PAF - Likely contributing to heart failure - Rate controlled here - Plan cardioversion tomorrow. Risk and benefits discussed - Continue Eliquis, Amiodarone and atenolol  3. CHB s/p PPM - Patient is pacemaker dependent - Plan for generator change out this admission with Dr. Loletha Grayer  4. HTN - BP improving  - Continue Atenolol and irbesartan - HCTZ on hold as she is getting lasix  5. Acute on CKD III - Baseline Scr ~1.8 - Renal function improved to 1.8 from 1.89 - Follow closely with diuresis   6. Hypothyroidism - TSH minimally up at 4.508 - continue home dose of synthroid 136mcg qd - Follow up with PCP post discharge   For questions or updates, please contact [facility] Please consult [email or web address] for  contact info under        SignedLeanor Kail, PA  06/28/2020, 10:21 AM    Personally seen and examined. Agree with above.   Careful review personally of telemetry reveals underlying coarse atrial fibrillation/flutter.  Ventricular pacing.  Have discussed the case with Dr. Lorenza Cambridge.  Needs a pacemaker generator change out.  He has been trying to facilitate this for quite some time.  She also needs a cardioversion.  She can stay on anticoagulation for her pacemaker generator change out.  Overall we will give her 1 dose of Lasix.  Make her n.p.o. past midnight for cardioversion hopefully tomorrow.  This will also allow for possible pacemaker generator change out tomorrow or Friday as well.  Candee Furbish, MD

## 2020-06-28 NOTE — Progress Notes (Signed)
  Echocardiogram 2D Echocardiogram has been performed.  Fidel Levy 06/28/2020, 9:57 AM

## 2020-06-29 ENCOUNTER — Inpatient Hospital Stay (HOSPITAL_COMMUNITY): Payer: Medicare Other | Admitting: Anesthesiology

## 2020-06-29 ENCOUNTER — Encounter (HOSPITAL_COMMUNITY)
Admission: EM | Disposition: A | Discharge status: Skilled Nursing Facility | Payer: Self-pay | Source: Home / Self Care | Attending: Cardiology

## 2020-06-29 ENCOUNTER — Encounter (HOSPITAL_COMMUNITY): Payer: Self-pay | Admitting: Cardiology

## 2020-06-29 DIAGNOSIS — I480 Paroxysmal atrial fibrillation: Secondary | ICD-10-CM | POA: Insufficient documentation

## 2020-06-29 DIAGNOSIS — I4891 Unspecified atrial fibrillation: Secondary | ICD-10-CM

## 2020-06-29 DIAGNOSIS — Z45010 Encounter for checking and testing of cardiac pacemaker pulse generator [battery]: Secondary | ICD-10-CM

## 2020-06-29 DIAGNOSIS — I4892 Unspecified atrial flutter: Secondary | ICD-10-CM

## 2020-06-29 HISTORY — PX: CARDIOVERSION: SHX1299

## 2020-06-29 HISTORY — PX: PPM GENERATOR CHANGEOUT: EP1233

## 2020-06-29 LAB — SURGICAL PCR SCREEN
MRSA, PCR: NEGATIVE
Staphylococcus aureus: NEGATIVE

## 2020-06-29 LAB — BASIC METABOLIC PANEL
Anion gap: 7 (ref 5–15)
BUN: 34 mg/dL — ABNORMAL HIGH (ref 8–23)
CO2: 33 mmol/L — ABNORMAL HIGH (ref 22–32)
Calcium: 9.6 mg/dL (ref 8.9–10.3)
Chloride: 100 mmol/L (ref 98–111)
Creatinine, Ser: 2.05 mg/dL — ABNORMAL HIGH (ref 0.44–1.00)
GFR calc Af Amer: 24 mL/min — ABNORMAL LOW (ref >60)
GFR calc non Af Amer: 21 mL/min — ABNORMAL LOW (ref >60)
Glucose, Bld: 93 mg/dL (ref 70–99)
Potassium: 4.1 mmol/L (ref 3.5–5.1)
Sodium: 140 mmol/L (ref 135–145)

## 2020-06-29 SURGERY — PPM GENERATOR CHANGEOUT

## 2020-06-29 SURGERY — CARDIOVERSION
Anesthesia: General

## 2020-06-29 MED ORDER — SODIUM CHLORIDE 0.9 % IV SOLN: INTRAVENOUS | Status: DC

## 2020-06-29 MED ORDER — SODIUM CHLORIDE 0.9 % IV SOLN
80.0000 mg | INTRAVENOUS | Status: AC
  Administered 2020-06-29: 80 mg
  Filled 2020-06-29: qty 2

## 2020-06-29 MED ORDER — CEFAZOLIN SODIUM-DEXTROSE 2-4 GM/100ML-% IV SOLN
2.0000 g | INTRAVENOUS | Status: AC
  Administered 2020-06-29: 2 g via INTRAVENOUS

## 2020-06-29 MED ORDER — CEFAZOLIN SODIUM-DEXTROSE 2-4 GM/100ML-% IV SOLN
INTRAVENOUS | Status: AC
  Filled 2020-06-29: qty 100

## 2020-06-29 MED ORDER — PROPOFOL 10 MG/ML IV BOLUS
INTRAVENOUS | Status: DC | PRN
  Administered 2020-06-29: 60 mg via INTRAVENOUS

## 2020-06-29 MED ORDER — LIDOCAINE HCL (PF) 1 % IJ SOLN
INTRAMUSCULAR | Status: DC | PRN
  Administered 2020-06-29: 50 mL

## 2020-06-29 MED ORDER — LIDOCAINE HCL (PF) 1 % IJ SOLN
INTRAMUSCULAR | Status: AC
  Filled 2020-06-29: qty 30

## 2020-06-29 MED ORDER — FENTANYL CITRATE (PF) 100 MCG/2ML IJ SOLN
INTRAMUSCULAR | Status: AC
  Filled 2020-06-29: qty 2

## 2020-06-29 MED ORDER — CEFAZOLIN SODIUM-DEXTROSE 1-4 GM/50ML-% IV SOLN
1.0000 g | Freq: Four times a day (QID) | INTRAVENOUS | Status: AC
  Administered 2020-06-29 – 2020-06-30 (×3): 1 g via INTRAVENOUS
  Filled 2020-06-29 (×3): qty 50

## 2020-06-29 MED ORDER — SODIUM CHLORIDE 0.9 % IV SOLN
INTRAVENOUS | Status: AC
  Filled 2020-06-29: qty 2

## 2020-06-29 MED ORDER — LIDOCAINE HCL (CARDIAC) PF 100 MG/5ML IV SOSY
PREFILLED_SYRINGE | INTRAVENOUS | Status: DC | PRN
  Administered 2020-06-29: 20 mg via INTRAVENOUS

## 2020-06-29 MED ORDER — CHLORHEXIDINE GLUCONATE 4 % EX LIQD
4.0000 "application " | Freq: Once | CUTANEOUS | Status: DC
  Filled 2020-06-29: qty 60

## 2020-06-29 MED ORDER — FENTANYL CITRATE (PF) 100 MCG/2ML IJ SOLN
INTRAMUSCULAR | Status: DC | PRN
Start: 2020-06-29 — End: 2020-06-29
  Administered 2020-06-29: 25 ug via INTRAVENOUS

## 2020-06-29 SURGICAL SUPPLY — 5 items
CABLE SURGICAL S-101-97-12 (CABLE) ×3 IMPLANT
PACEMAKER ASSURITY DR-RF (Pacemaker) ×3 IMPLANT
PAD PRO RADIOLUCENT 2001M-C (PAD) ×3 IMPLANT
POUCH AIGIS-R ANTIBACT PPM (Mesh General) ×3 IMPLANT
TRAY PACEMAKER INSERTION (PACKS) ×3 IMPLANT

## 2020-06-29 NOTE — Anesthesia Postprocedure Evaluation (Signed)
Anesthesia Post Note  Patient: Jill Washington  Procedure(s) Performed: CARDIOVERSION (N/A )     Patient location during evaluation: Endoscopy Anesthesia Type: General Level of consciousness: awake and alert, patient cooperative and oriented Pain management: pain level controlled Vital Signs Assessment: post-procedure vital signs reviewed and stable Respiratory status: spontaneous breathing, nonlabored ventilation and respiratory function stable Cardiovascular status: blood pressure returned to baseline and stable Postop Assessment: no apparent nausea or vomiting Anesthetic complications: no   No complications documented.  Last Vitals:  Vitals:   06/29/20 1234 06/29/20 1321  BP: (!) 107/51 106/62  Pulse: 62 63  Resp: 15   Temp:    SpO2: 96%     Last Pain:  Vitals:   06/29/20 1228  TempSrc:   PainSc: 0-No pain                 Arnita Koons,E. Charles Niese

## 2020-06-29 NOTE — Transfer of Care (Signed)
Immediate Anesthesia Transfer of Care Note  Patient: Jill Washington  Procedure(s) Performed: CARDIOVERSION (N/A )  Patient Location: Endoscopy Unit  Anesthesia Type:General  Level of Consciousness: sedated  Airway & Oxygen Therapy: Patient Spontanous Breathing  Post-op Assessment: Post -op Vital signs reviewed and stable  Post vital signs: stable  Last Vitals:  Vitals Value Taken Time  BP    Temp    Pulse    Resp    SpO2      Last Pain:  Vitals:   06/29/20 1115  TempSrc: Oral  PainSc: 6          Complications: No complications documented.

## 2020-06-29 NOTE — Op Note (Signed)
Procedure report  Procedure performed:  Dual chamber pacemaker generator changeout  Aegis pouch  Reason for procedure:  1. Device generator at elective replacement interval  2. Complete heart block Procedure performed by:  Sanda Klein, MD  Complications:  None  Estimated blood loss:  <5 mL  Medications administered during procedure:  Ancef 2 g intravenously, lidocaine 1% 30 mL locally, fentanyl 25 mcg intravenously  Device details:   [employer] model number 2272, serial number [id number] Right atrial lead (chronic) St. Jude passive plus, model number [id number], serial number [id number] (implanted 05/11/1997) Right ventricular lead (chronic)  St. Jude passive plus, model number [id number], serial number [id number] (implanted 05/11/1997)  Explanted generator St. Jude Accent,  model number [id number]  Procedure details:  After the risks and benefits of the procedure were discussed the patient provided informed consent. She was brought to the cardiac catheter lab in the fasting state. The patient was prepped and draped in usual sterile fashion. Local anesthesia with 1% lidocaine was administered to to the left infraclavicular area. A 5-6cm horizontal incision was made parallel with and 2-3 cm caudal to the right clavicle, in the area of an old scar. An older scar was seen closer to the clavicle. Using minimal electrocautery and mostly sharp and blunt dissection the prepectoral pocket was opened carefully to avoid injury to the loops of chronic leads. Extensive dissection was not necessary. The device was explanted. The pocket was carefully inspected for hemostasis and flushed with copious amounts of antibiotic solution.  The leads were disconnected from the old generator and testing of the lead parameters later showed excellent values. The new generator was connected to the chronic leads, with appropriate pacing noted. The device was placed in the presoaked Aegis pouch.  The entire system  was then carefully inserted in the pocket with care been taking that the leads and device assumed a comfortable position without pressure on the incision. Great care was taken that the leads be located deep to the generator. The pocket was then closed in layers using 2 layers of 2-0 Vicryl, one layer of 3-0 vicryl, Steristrips after which a sterile dressing was applied.   At the end of the procedure the following lead parameters were encountered:   Right atrial lead sensed P waves 2 mV (AFib), impedance 440 ohms, threshold not tested.  Right ventricular lead sensed R waves  >12 mV, impedance 540 ohms, threshold 0.75 at 0.5 ms pulse width.  Sanda Klein, MD, [facility] [phone] office [phone] pager

## 2020-06-29 NOTE — Progress Notes (Addendum)
Progress Note  Patient Name: Jill Washington Date of Encounter: 06/29/2020  [facility] Cardiologist: Sanda Klein, MD   Subjective   No chest pain. Breathing same.   Inpatient Medications    Scheduled Meds: . amiodarone  100 mg Oral Daily  . apixaban  2.5 mg Oral BID  . atenolol  75 mg Oral Daily  . chlorhexidine  4 application Topical Once  . cholecalciferol  1,000 Units Oral Daily  . feeding supplement (ENSURE ENLIVE)  237 mL Oral BID BM  . gabapentin  300 mg Oral BID  . gentamicin irrigation  80 mg Irrigation On Call  . influenza vaccine adjuvanted  0.5 mL Intramuscular Tomorrow-1000  . levothyroxine  137 mcg Oral QAC breakfast  . mirabegron ER  50 mg Oral Daily  . multivitamin with minerals  1 tablet Oral Daily  . pantoprazole  40 mg Oral Daily  . pneumococcal 23 valent vaccine  0.5 mL Intramuscular Tomorrow-1000  . pravastatin  40 mg Oral Daily  . sodium chloride flush  3 mL Intravenous Q12H   Continuous Infusions: . sodium chloride    . sodium chloride    .  ceFAZolin (ANCEF) IV     PRN Meds: sodium chloride, acetaminophen, ondansetron (ZOFRAN) IV, polyvinyl alcohol, sodium chloride flush   Vital Signs    Vitals:   06/28/20 1426 06/28/20 1944 06/29/20 0009 06/29/20 0640  BP: 124/73 (!) 142/67 123/65 126/63  Pulse: 74 78 78 74  Resp: 16 (!) 23 19 20   Temp: 98.3 F (36.8 C) 98.2 F (36.8 C) 98.3 F (36.8 C) 97.8 F (36.6 C)  TempSrc: Oral Oral Oral Oral  SpO2: 94% 95% 95% 97%  Weight:    77.8 kg  Height:        Intake/Output Summary (Last 24 hours) at 06/29/2020 0911 Last data filed at 06/29/2020 0641 Gross per 24 hour  Intake --  Output 2500 ml  Net -2500 ml   Last 3 Weights 06/29/2020 06/28/2020 06/27/2020  Weight (lbs) 171 lb 8.3 oz 172 lb 9.9 oz 170 lb  Weight (kg) 77.8 kg 78.3 kg 77.111 kg      Telemetry    V paced rhythm with rate PVC. Underlying rhythm is afib - Personally Reviewed  ECG    N/A  Physical Exam   GEN: No  acute distress.   Neck: No JVD Cardiac: RRR, no murmurs, rubs, or gallops.  Respiratory: Clear to auscultation bilaterally. GI: Soft, nontender, non-distended  MS: No edema; No deformity. Neuro:  Nonfocal  Psych: Normal affect   Labs    High Sensitivity Troponin:   Recent Labs  Lab 06/27/20 1043 06/27/20 1822  TROPONINIHS 33* 39*      Chemistry Recent Labs  Lab 06/27/20 1043 06/28/20 0327 06/29/20 0500  NA 140 139 140  K 4.7 4.6 4.1  CL 106 103 100  CO2 23 26 33*  GLUCOSE 113* 93 93  BUN 19 22 34*  CREATININE 1.89* 1.80* 2.05*  CALCIUM 9.5 9.6 9.6  GFRNONAA 23* 24* 21*  GFRAA 26* 28* 24*  ANIONGAP 11 10 7      Hematology Recent Labs  Lab 06/27/20 1043  WBC 11.0*  RBC 3.81*  HGB 11.3*  HCT 36.4  MCV 95.5  MCH 29.7  MCHC 31.0  RDW 12.4  PLT 205    BNP Recent Labs  Lab 06/27/20 1824  BNP 1,310.8*    Radiology    DG Chest 2 View  Result Date: 06/27/2020 CLINICAL DATA:  Chest pain.  Shortness of breath. EXAM: CHEST - 2 VIEW COMPARISON:  02/09/2020 FINDINGS: Normal heart size and mediastinal contours. Dual-chamber pacer leads from the right in unremarkable position. Mild scarring in the right lung and costophrenic sulci. There is no edema, consolidation, effusion, or pneumothorax. IMPRESSION: No acute finding when compared with prior. Electronically Signed   By: Monte Fantasia M.D.   On: 06/27/2020 11:15   ECHOCARDIOGRAM COMPLETE  Result Date: 06/28/2020    ECHOCARDIOGRAM REPORT   Patient Name:   [name] Date of Exam: 06/28/2020 Medical Rec #:  [id number]         Height:       65.0 in Accession #:    [id number]        Weight:       172.6 lb Date of Birth:  [birth year]          BSA:          1.858 m Patient Age:    86 years          BP:           161/77 mmHg Patient Gender: F                 HR:           79 bpm. Exam Location:  Inpatient Procedure: 2D Echo, Cardiac Doppler and Color Doppler Indications:    CHF-Acute Diastolic 419.62 / I29.79   History:        Patient has prior history of Echocardiogram examinations, most                 recent 08/04/2015. Mitral Valve Prolapse, Arrythmias:Atrial                 Fibrillation; Risk Factors:Hypertension.  Sonographer:    Bernadene Person RDCS Referring Phys: [place]  1. Left ventricular ejection fraction, by estimation, is 60 to 65%. The left ventricle has normal function. The left ventricle has no regional wall motion abnormalities. Left ventricular diastolic parameters are indeterminate.  2. Right ventricular systolic function is normal. The right ventricular size is small. There is moderately elevated pulmonary artery systolic pressure.  3. Right atrial size was mildly dilated.  4. The mitral valve is myxomatous. Mild mitral valve regurgitation. There is mild prolapse of anterior leaflet of the mitral valve.  5. The aortic valve is normal in structure. Aortic valve regurgitation is not visualized. No aortic stenosis is present. FINDINGS  Left Ventricle: Left ventricular ejection fraction, by estimation, is 60 to 65%. The left ventricle has normal function. The left ventricle has no regional wall motion abnormalities. The left ventricular internal cavity size was normal in size. There is  no left ventricular hypertrophy. Left ventricular diastolic parameters are indeterminate. Right Ventricle: The right ventricular size is small. Right vetricular wall thickness was not well visualized. Right ventricular systolic function is normal. There is moderately elevated pulmonary artery systolic pressure. The tricuspid regurgitant velocity is 3.29 m/s, and with an assumed right atrial pressure of 3 mmHg, the estimated right ventricular systolic pressure is 89.2 mmHg. Left Atrium: Left atrial size was normal in size. Right Atrium: Right atrial size was mildly dilated. Pericardium: There is no evidence of pericardial effusion. Mitral Valve: The mitral valve is myxomatous. There is mild prolapse of  anterior leaflet of the mitral valve. Mild mitral valve regurgitation. Tricuspid Valve: The tricuspid valve is normal in structure. Tricuspid valve regurgitation is mild. Aortic Valve: The aortic  valve is normal in structure. There is mild aortic valve annular calcification. Aortic valve regurgitation is not visualized. No aortic stenosis is present. Pulmonic Valve: The pulmonic valve was grossly normal. Pulmonic valve regurgitation is not visualized. Aorta: The aortic root and ascending aorta are structurally normal, with no evidence of dilitation. IAS/Shunts: The atrial septum is grossly normal.  LEFT VENTRICLE PLAX 2D LVIDd:         4.60 cm  Diastology LVIDs:         3.30 cm  LV e' lateral:   9.00 cm/s LV PW:         1.00 cm  LV E/e' lateral: 12.8 LV IVS:        1.10 cm LVOT diam:     2.00 cm LV SV:         63 LV SV Index:   34 LVOT Area:     3.14 cm  RIGHT VENTRICLE RV S prime:     8.60 cm/s TAPSE (M-mode): 1.1 cm LEFT ATRIUM             Index       RIGHT ATRIUM           Index LA diam:        3.30 cm 1.78 cm/m  RA Area:     18.60 cm LA Vol (A2C):   45.1 ml 24.27 ml/m RA Volume:   43.90 ml  23.63 ml/m LA Vol (A4C):   42.7 ml 22.98 ml/m LA Biplane Vol: 46.7 ml 25.13 ml/m  AORTIC VALVE LVOT Vmax:   94.20 cm/s LVOT Vmean:  70.600 cm/s LVOT VTI:    0.201 m  AORTA Ao Root diam: 3.30 cm Ao Asc diam:  3.20 cm MITRAL VALVE                TRICUSPID VALVE MV Area (PHT): 6.17 cm     TR Peak grad:   43.3 mmHg MV Decel Time: 123 msec     TR Vmax:        329.00 cm/s MV E velocity: 115.00 cm/s MV A velocity: 31.20 cm/s   SHUNTS MV E/A ratio:  3.69         Systemic VTI:  0.20 m                             Systemic Diam: 2.00 cm Mertie Moores MD Electronically signed by Mertie Moores MD Signature Date/Time: 06/28/2020/10:18:42 AM    Final     Cardiac Studies   Echo 06/28/20 1. Left ventricular ejection fraction, by estimation, is 60 to 65%. The  left ventricle has normal function. The left ventricle has no regional    wall motion abnormalities. Left ventricular diastolic parameters are  indeterminate.  2. Right ventricular systolic function is normal. The right ventricular  size is small. There is moderately elevated pulmonary artery systolic  pressure.  3. Right atrial size was mildly dilated.  4. The mitral valve is myxomatous. Mild mitral valve regurgitation. There  is mild prolapse of anterior leaflet of the mitral valve.  5. The aortic valve is normal in structure. Aortic valve regurgitation is  not visualized. No aortic stenosis is present.   Patient Profile     86 y.o. female with h/o SSS, CHB (pacemaker dependent,dual-chamber permanent pacemaker-St. [phone]), paroxysmal atrial tachycardia and paroxysmal atrial fibrillation, hypertension, hyperlipidemia, treated hypothyroidism but no significant known structural heart disease presented for worsening dyspnea and LE edema.  Also noted palpitations.   PM interrogation in the ER shows atrial fibrillation since the beginning of September and EOL for the PM battery.  Assessment & Plan    1. Acute on chronic diastolic CHF - Likely due to afib RVR. BNP > 1000. - Given IV lasix 40mg  daily x 2. However BUN/SCr worsen 19/1.89>>22/1.8>>34/2.05 today. HCTZ on hold. Diuresed -2.5L yesterday and -4.4 total.  - Given worsen renal function. Will hold diuretics today and stop ARB to see response.  - Echo with preserved LVEF. No WM abnormality  2. PAF - Likely contributing to heart failure - Plan cardioversion today  - Continue Eliquis, Amiodarone and atenolol  3. CHB s/p PPM - Plan for generator change today   4. HTN - BP stable - Continue Atenolol - Hold  Irbesartan today given worsen renal function - HCTZ on hold   5. Acute on CKD III - Baseline Scr ~1.8 - Renal function worsen today. As above  6. Hypothyroidism - TSH minimally up at 4.508 - continue home dose of synthroid 120mcg qd - Follow up with PCP post discharge  For  questions or updates, please contact [facility] Please consult [email or web address] for contact info under        SignedLeanor Kail, PA  06/29/2020, 9:11 AM    Personally seen and examined. Agree with above.   Doing well, post cardioversion, post generator change out.  Currently AV paced on telemetry personally reviewed.  She is quite Arts administrator.  Talks about her [place] heritage.  Her [place].  She was married to a [employer] man, moved to 13 times in 10 years.  He was very good to her she states.  They had a great time.  Pacer dressing intact.  Hopeful DC tomorrow.  Candee Furbish, MD

## 2020-06-29 NOTE — Interval H&P Note (Signed)
History and Physical Interval Note:  06/29/2020 10:11 AM  Jill Washington  has presented today for surgery, with the diagnosis of eri.  The various methods of treatment have been discussed with the patient and family. After consideration of risks, benefits and other options for treatment, the patient has consented to  Procedure(s): PPM GENERATOR CHANGEOUT (N/A) as a surgical intervention.  The patient's history has been reviewed, patient examined, no change in status, stable for surgery.  I have reviewed the patient's chart and labs.  Questions were answered to the patient's satisfaction.     Shatia Sindoni

## 2020-06-29 NOTE — CV Procedure (Signed)
Procedure:   DCCV  Indication:  Symptomatic atrial fibrillation/flutter  Procedure Note:  The patient signed informed consent.  They have had had therapeutic anticoagulation with apixaban greater than 3 weeks.  Anesthesia was administered by Dr. Glennon Mac.  Patient received 20 mg IV lidocaine and 60 mg IV propofol.Adequate airway was maintained throughout and vital followed per protocol.  They were cardioverted x 2 with 120, 150J of biphasic synchronized energy.  They converted to atrial paced, ventricular paced rhythm. This was confirmed via Occupational hygienist.  There were no apparent complications.  The patient had normal neuro status and respiratory status post procedure with vitals stable as recorded elsewhere.    Follow up:  They will continue on current medical therapy and follow up with cardiology as scheduled.  Buford Dresser, MD PhD 06/29/2020 12:11 PM

## 2020-06-29 NOTE — Interval H&P Note (Signed)
History and Physical Interval Note:  06/29/2020 11:28 AM  Jill Washington  has presented today for surgery, with the diagnosis of a fib.  The various methods of treatment have been discussed with the patient and family. After consideration of risks, benefits and other options for treatment, the patient has consented to  Procedure(s): CARDIOVERSION (N/A) as a surgical intervention.  The patient's history has been reviewed, patient examined, no change in status, stable for surgery.  I have reviewed the patient's chart and labs.  Questions were answered to the patient's satisfaction.     Lisett Dirusso Harrell Gave

## 2020-06-29 NOTE — Anesthesia Preprocedure Evaluation (Addendum)
Anesthesia Evaluation  Patient identified by MRN, date of birth, ID band Patient awake    Reviewed: Allergy & Precautions, NPO status , Patient's Chart, lab work & pertinent test results, reviewed documented beta blocker date and time   History of Anesthesia Complications Negative for: history of anesthetic complications  Airway Mallampati: I  TM Distance: >3 FB Neck ROM: Full    Dental  (+) Upper Dentures, Lower Dentures, Dental Advisory Given   Pulmonary former smoker,  06/27/2020 SARS coronavirus NEG   breath sounds clear to auscultation       Cardiovascular hypertension, Pt. on medications and Pt. on home beta blockers (-) angina+ dysrhythmias Atrial Fibrillation + pacemaker  Rhythm:Irregular Rate:Normal  06/28/2020 ECHO: EF 60-65%, mild MR   Neuro/Psych negative neurological ROS     GI/Hepatic Neg liver ROS, GERD  Medicated and Controlled,  Endo/Other  Hypothyroidism   Renal/GU Renal InsufficiencyRenal disease     Musculoskeletal  (+) Arthritis , Fibromyalgia -  Abdominal   Peds  Hematology negative hematology ROS (+) eliquis   Anesthesia Other Findings   Reproductive/Obstetrics                           Anesthesia Physical Anesthesia Plan  ASA: III  Anesthesia Plan: General   Post-op Pain Management:    Induction: Intravenous  PONV Risk Score and Plan: 3 and Treatment may vary due to age or medical condition  Airway Management Planned: Natural Airway and Mask  Additional Equipment: None  Intra-op Plan:   Post-operative Plan:   Informed Consent: I have reviewed the patients History and Physical, chart, labs and discussed the procedure including the risks, benefits and alternatives for the proposed anesthesia with the patient or authorized representative who has indicated his/her understanding and acceptance.     Dental advisory given  Plan Discussed with: CRNA and  Surgeon  Anesthesia Plan Comments:        Anesthesia Quick Evaluation

## 2020-06-30 ENCOUNTER — Encounter (HOSPITAL_COMMUNITY): Payer: Self-pay | Admitting: Cardiovascular Disease

## 2020-06-30 LAB — BASIC METABOLIC PANEL
Anion gap: 7 (ref 5–15)
BUN: 44 mg/dL — ABNORMAL HIGH (ref 8–23)
CO2: 29 mmol/L (ref 22–32)
Calcium: 8.7 mg/dL — ABNORMAL LOW (ref 8.9–10.3)
Chloride: 99 mmol/L (ref 98–111)
Creatinine, Ser: 2.48 mg/dL — ABNORMAL HIGH (ref 0.44–1.00)
GFR calc Af Amer: 19 mL/min — ABNORMAL LOW (ref >60)
GFR calc non Af Amer: 16 mL/min — ABNORMAL LOW (ref >60)
Glucose, Bld: 99 mg/dL (ref 70–99)
Potassium: 4 mmol/L (ref 3.5–5.1)
Sodium: 135 mmol/L (ref 135–145)

## 2020-06-30 NOTE — NC FL2 (Signed)
[place] LEVEL OF CARE SCREENING TOOL     IDENTIFICATION  Patient Name: Jill Washington Birthdate: 1926-03-26 Sex: female Admission Date (Current Location): 06/27/2020  [facility] and [place] Number:  Herbalist and Address:  [facility] [place], [place] [address], [place], [place] [address]      Provider Number: [id number]  Attending Physician Name and Address:  Dorothy Spark, MD  Relative Name and Phone Number:  [name] [phone]    Current Level of Care: Hospital Recommended Level of Care: [place] Prior Approval Number:    Date Approved/Denied:   PASRR Number: [id number]  Discharge Plan: SNF    Current Diagnoses: Patient Active Problem List   Diagnosis Date Noted   Atrial flutter [facility])    Acute CHF (congestive heart failure) ([place]) 06/27/2020   Renal insufficiency 05/25/2018   Urinary frequency 05/25/2018   Hyperkalemia 01/25/2018   Abdominal pain 04/25/2017   Diarrhea 04/25/2017   SSS (sick sinus syndrome) ([place]) 10/23/2016   Knee pain, bilateral 05/31/2016   Medicare annual wellness visit, subsequent 05/15/2015   CHB (complete heart block) ([place]) 11/10/2014   Pedal edema 04/10/2014   History of shingles    Long term current use of anticoagulant therapy 01/30/2013   Pacemaker -  St. Jude leads implanted in 1998 generator change 2006 and 2013 07/31/2011   Back pain 05/07/2011   Overactive bladder 05/07/2011   B12 deficiency 01/22/2011   Anemia 01/22/2011   Irritable bowel syndrome 04/19/2010   Vitamin D deficiency 10/17/2009   Hypercholesterolemia 10/17/2009   Critical illness polyneuropathy ([place]) 06/23/2008   Atrial fibrillation ([place]) 06/23/2008   Venous (peripheral) insufficiency 06/23/2008   History of colon polyps 02/29/2008   Hypothyroidism 02/29/2008   PERIPHERAL NEUROPATHY 02/29/2008   Essential hypertension 02/29/2008   MITRAL VALVE PROLAPSE 02/29/2008    History of sick sinus syndrome 02/29/2008   Hiatal hernia with GERD 02/29/2008   FIBROMYALGIA 02/29/2008   SKIN CANCER, HX OF 02/29/2008    Orientation RESPIRATION BLADDER Height & Weight     Self, Time, Situation, Place  Normal Continent, External catheter (External Urinary Catheter) Weight: 175 lb 0.7 oz (79.4 kg) Height:  5\' 5"  (165.1 cm)  BEHAVIORAL SYMPTOMS/MOOD NEUROLOGICAL BOWEL NUTRITION STATUS      Continent Diet (See Discharge Summary)  AMBULATORY STATUS COMMUNICATION OF NEEDS Skin   Limited Assist Verbally Surgical wounds, Skin abrasions, Other (Comment) (Appropriate for ethnicity,Dry;Abrasion;Leg Right Foam,Wound/Incision (open or dehisced) Leg Right Lower Foam-Lift D[place]ing,Clean;Dry;Intact, Every 3 days; scant)                       Personal Care Assistance Level of Assistance  Bathing, Feeding, Dressing Bathing Assistance: Limited assistance Feeding assistance: Independent (able to feed self;Cardiac) Dressing Assistance: Limited assistance     Functional Limitations Info  Sight, Hearing, Speech Sight Info: Adequate Hearing Info: Impaired Speech Info: Adequate    SPECIAL CARE FACTORS FREQUENCY  PT (By licensed PT), OT (By licensed OT)     PT Frequency: 5x min weekly OT Frequency: 5x min weekly            Contractures Contractures Info: Not present    Additional Factors Info  Code Status, Allergies Code Status Info: FULL Allergies Info: Iodinated Diagnostic Agents,Cymbalta,Other Dial Soap,           Current Medications (06/30/2020):  This is the current hospital active medication list Current Facility-Administered Medications  Medication Dose Route Frequency Provider Last Rate  Last Admin   0.9 %  sodium chloride infusion  250 mL Intravenous PRN Croitoru, Mihai, MD   Continued from Pre-op at 06/29/20 1207   acetaminophen (TYLENOL) tablet 650 mg  650 mg Oral Q4H PRN Croitoru, Mihai, MD   650 mg at 06/29/20 1541   amiodarone (PACERONE)  tablet 100 mg  100 mg Oral Daily Croitoru, Mihai, MD   100 mg at 06/30/20 1027   apixaban (ELIQUIS) tablet 2.5 mg  2.5 mg Oral BID Croitoru, Mihai, MD   2.5 mg at 06/30/20 0937   atenolol (TENORMIN) tablet 75 mg  75 mg Oral Daily Croitoru, Mihai, MD   75 mg at 06/30/20 2536   cholecalciferol (VITAMIN D3) tablet 1,000 Units  1,000 Units Oral Daily Croitoru, Mihai, MD   1,000 Units at 06/30/20 0938   feeding supplement (ENSURE ENLIVE) (ENSURE ENLIVE) liquid 237 mL  237 mL Oral BID BM Croitoru, Mihai, MD   237 mL at 06/30/20 1437   gabapentin (NEURONTIN) capsule 300 mg  300 mg Oral BID Croitoru, Mihai, MD   300 mg at 06/30/20 6440   levothyroxine (SYNTHROID) tablet 137 mcg  137 mcg Oral QAC breakfast Croitoru, Mihai, MD   137 mcg at 06/30/20 0754   mirabegron ER (MYRBETRIQ) tablet 50 mg  50 mg Oral Daily Croitoru, Mihai, MD   50 mg at 06/30/20 3474   multivitamin with minerals tablet 1 tablet  1 tablet Oral Daily Croitoru, Mihai, MD   1 tablet at 06/30/20 0938   ondansetron (ZOFRAN) injection 4 mg  4 mg Intravenous Q6H PRN Croitoru, Mihai, MD       pantoprazole (PROTONIX) EC tablet 40 mg  40 mg Oral Daily Croitoru, Mihai, MD   40 mg at 06/30/20 0937   pneumococcal 23 valent vaccine (PNEUMOVAX-23) injection 0.5 mL  0.5 mL Intramuscular Tomorrow-1000 Dorothy Spark, MD       polyvinyl alcohol (LIQUIFILM TEARS) 1.4 % ophthalmic solution 1 drop  1 drop Both Eyes TID PRN Croitoru, Mihai, MD       pravastatin (PRAVACHOL) tablet 40 mg  40 mg Oral Daily Croitoru, Mihai, MD   40 mg at 06/30/20 2595   sodium chloride flush (NS) 0.9 % injection 3 mL  3 mL Intravenous Q12H Croitoru, Mihai, MD   3 mL at 06/30/20 0941   sodium chloride flush (NS) 0.9 % injection 3 mL  3 mL Intravenous PRN Croitoru, Mihai, MD         Discharge Medications: Please see discharge summary for a list of discharge medications.  Relevant Imaging Results:  Relevant Lab Results:   Additional  Information [id number]  Trula Ore, LCSWA

## 2020-06-30 NOTE — Progress Notes (Signed)
Patient having bilateral leg pain, stated that the gabapentin is not working as it should. Helped patient to the side of bed and could not stand up. Helped her back into bed.  Placed PT consult since she lives by herself. Will continue to monitor patient.

## 2020-06-30 NOTE — Progress Notes (Addendum)
Progress Note  Patient Name: Jill Washington Date of Encounter: 06/30/2020  [facility] Cardiologist: Sanda Klein, MD   Subjective   Complains of leg pain L > R. Can not able place any weight. Pending PT eval. No chest pain or dyspnea.   Inpatient Medications    Scheduled Meds:  amiodarone  100 mg Oral Daily   apixaban  2.5 mg Oral BID   atenolol  75 mg Oral Daily   cholecalciferol  1,000 Units Oral Daily   feeding supplement (ENSURE ENLIVE)  237 mL Oral BID BM   gabapentin  300 mg Oral BID   influenza vaccine adjuvanted  0.5 mL Intramuscular Tomorrow-1000   levothyroxine  137 mcg Oral QAC breakfast   mirabegron ER  50 mg Oral Daily   multivitamin with minerals  1 tablet Oral Daily   pantoprazole  40 mg Oral Daily   pneumococcal 23 valent vaccine  0.5 mL Intramuscular Tomorrow-1000   pravastatin  40 mg Oral Daily   sodium chloride flush  3 mL Intravenous Q12H   Continuous Infusions:  sodium chloride     PRN Meds: sodium chloride, acetaminophen, ondansetron (ZOFRAN) IV, polyvinyl alcohol, sodium chloride flush   Vital Signs    Vitals:   06/29/20 1400 06/29/20 2058 06/30/20 0534 06/30/20 0748  BP: 123/68 (!) 135/55 (!) 108/44 (!) 106/44  Pulse: 71 93 63 69  Resp: 16 16 20 20   Temp: 97.6 F (36.4 C) 98.7 F (37.1 C) 98.1 F (36.7 C) 98.2 F (36.8 C)  TempSrc: Oral Oral Oral Oral  SpO2: 93% 97% 92% 93%  Weight:   79.4 kg   Height:        Intake/Output Summary (Last 24 hours) at 06/30/2020 0858 Last data filed at 06/30/2020 0700 Gross per 24 hour  Intake 710 ml  Output --  Net 710 ml   Last 3 Weights 06/30/2020 06/29/2020 06/28/2020  Weight (lbs) 175 lb 0.7 oz 171 lb 8.3 oz 172 lb 9.9 oz  Weight (kg) 79.4 kg 77.8 kg 78.3 kg      Telemetry    AV paced rhythm - Personally Reviewed  ECG    AV paced rhythm - Personally Reviewed  Physical Exam   GEN: No acute distress.   Neck: No JVD Cardiac: RRR, no murmurs, rubs, or gallops.   Respiratory: Clear to auscultation bilaterally. GI: Soft, nontender, non-distended  MS: No edema; No deformity. Tender LLE Neuro:  Nonfocal  Psych: Normal affect   Labs    High Sensitivity Troponin:   Recent Labs  Lab 06/27/20 1043 06/27/20 1822  TROPONINIHS 33* 39*      Chemistry Recent Labs  Lab 06/28/20 0327 06/29/20 0500 06/30/20 0513  NA 139 140 135  K 4.6 4.1 4.0  CL 103 100 99  CO2 26 33* 29  GLUCOSE 93 93 99  BUN 22 34* 44*  CREATININE 1.80* 2.05* 2.48*  CALCIUM 9.6 9.6 8.7*  GFRNONAA 24* 21* 16*  GFRAA 28* 24* 19*  ANIONGAP 10 7 7      Hematology Recent Labs  Lab 06/27/20 1043  WBC 11.0*  RBC 3.81*  HGB 11.3*  HCT 36.4  MCV 95.5  MCH 29.7  MCHC 31.0  RDW 12.4  PLT 205    BNP Recent Labs  Lab 06/27/20 1824  BNP 1,310.8*     DDimer No results for input(s): DDIMER in the last 168 hours.   Radiology    EP PPM/ICD IMPLANT  Result Date: 06/29/2020 Procedure performed: Dual  chamber pacemaker generator changeout Aegis pouch Reason for procedure: 1. Device generator at elective replacement interval 2. Complete heart block Procedure performed by: Sanda Klein, MD Complications: None Estimated blood loss: <5 mL Medications administered during procedure: Ancef 2 g intravenously, lidocaine 1% 30 mL locally, fentanyl 25 mcg intravenously Device details: [place] model number 2272, serial number [id number] Right atrial lead (chronic) St. Jude passive plus, model number [id number], serial number [id number] (implanted 05/11/1997) Right ventricular lead (chronic)  St. Jude passive plus, model number [id number], serial number [id number] (implanted 05/11/1997) Explanted generator St. Jude Accent,  model number [id number] Procedure details: After the risks and benefits of the procedure were discussed the patient provided informed consent. She was brought to the cardiac catheter lab in the fasting state. The patient was prepped and draped in usual sterile fashion.  Local anesthesia with 1% lidocaine was administered to to the left infraclavicular area. A 5-6cm horizontal incision was made parallel with and 2-3 cm caudal to the right clavicle, in the area of an old scar. An older scar was seen closer to the clavicle. Using minimal electrocautery and mostly sharp and blunt dissection the prepectoral pocket was opened carefully to avoid injury to the loops of chronic leads. Extensive dissection was not necessary. The device was explanted. The pocket was carefully inspected for hemostasis and flushed with copious amounts of antibiotic solution. The leads were disconnected from the old generator and testing of the lead parameters later showed excellent values. The new generator was connected to the chronic leads, with appropriate pacing noted. The device was placed in the presoaked Aegis pouch. The entire system was then carefully inserted in the pocket with care been taking that the leads and device assumed a comfortable position without pressure on the incision. Great care was taken that the leads be located deep to the generator. The pocket was then closed in layers using 2 layers of 2-0 Vicryl, one layer of 3-0 vicryl, Steristrips after which a sterile dressing was applied. At the end of the procedure the following lead parameters were encountered: Right atrial lead sensed P waves 2 mV (AFib), impedance 440 ohms, threshold not tested. Right ventricular lead sensed R waves  >12 mV, impedance 540 ohms, threshold 0.75 at 0.5 ms pulse width.  ECHOCARDIOGRAM COMPLETE  Result Date: 06/28/2020    ECHOCARDIOGRAM REPORT   Patient Name:   Jill Washington Date of Exam: 06/28/2020 Medical Rec #:  [id number]         Height:       65.0 in Accession #:    [id number]        Weight:       172.6 lb Date of Birth:  May 12, 1926          BSA:          1.858 m Patient Age:    4 years          BP:           161/77 mmHg Patient Gender: F                 HR:           79 bpm. Exam Location:   Inpatient Procedure: 2D Echo, Cardiac Doppler and Color Doppler Indications:    CHF-Acute Diastolic 631.49 / F02.63  History:        Patient has prior history of Echocardiogram examinations, most  recent 08/04/2015. Mitral Valve Prolapse, Arrythmias:Atrial                 Fibrillation; Risk Factors:Hypertension.  Sonographer:    Bernadene Person RDCS Referring Phys: [place]  1. Left ventricular ejection fraction, by estimation, is 60 to 65%. The left ventricle has normal function. The left ventricle has no regional wall motion abnormalities. Left ventricular diastolic parameters are indeterminate.  2. Right ventricular systolic function is normal. The right ventricular size is small. There is moderately elevated pulmonary artery systolic pressure.  3. Right atrial size was mildly dilated.  4. The mitral valve is myxomatous. Mild mitral valve regurgitation. There is mild prolapse of anterior leaflet of the mitral valve.  5. The aortic valve is normal in structure. Aortic valve regurgitation is not visualized. No aortic stenosis is present. FINDINGS  Left Ventricle: Left ventricular ejection fraction, by estimation, is 60 to 65%. The left ventricle has normal function. The left ventricle has no regional wall motion abnormalities. The left ventricular internal cavity size was normal in size. There is  no left ventricular hypertrophy. Left ventricular diastolic parameters are indeterminate. Right Ventricle: The right ventricular size is small. Right vetricular wall thickness was not well visualized. Right ventricular systolic function is normal. There is moderately elevated pulmonary artery systolic pressure. The tricuspid regurgitant velocity is 3.29 m/s, and with an assumed right atrial pressure of 3 mmHg, the estimated right ventricular systolic pressure is 09.3 mmHg. Left Atrium: Left atrial size was normal in size. Right Atrium: Right atrial size was mildly dilated. Pericardium:  There is no evidence of pericardial effusion. Mitral Valve: The mitral valve is myxomatous. There is mild prolapse of anterior leaflet of the mitral valve. Mild mitral valve regurgitation. Tricuspid Valve: The tricuspid valve is normal in structure. Tricuspid valve regurgitation is mild. Aortic Valve: The aortic valve is normal in structure. There is mild aortic valve annular calcification. Aortic valve regurgitation is not visualized. No aortic stenosis is present. Pulmonic Valve: The pulmonic valve was grossly normal. Pulmonic valve regurgitation is not visualized. Aorta: The aortic root and ascending aorta are structurally normal, with no evidence of dilitation. IAS/Shunts: The atrial septum is grossly normal.  LEFT VENTRICLE PLAX 2D LVIDd:         4.60 cm  Diastology LVIDs:         3.30 cm  LV e' lateral:   9.00 cm/s LV PW:         1.00 cm  LV E/e' lateral: 12.8 LV IVS:        1.10 cm LVOT diam:     2.00 cm LV SV:         63 LV SV Index:   34 LVOT Area:     3.14 cm  RIGHT VENTRICLE RV S prime:     8.60 cm/s TAPSE (M-mode): 1.1 cm LEFT ATRIUM             Index       RIGHT ATRIUM           Index LA diam:        3.30 cm 1.78 cm/m  RA Area:     18.60 cm LA Vol (A2C):   45.1 ml 24.27 ml/m RA Volume:   43.90 ml  23.63 ml/m LA Vol (A4C):   42.7 ml 22.98 ml/m LA Biplane Vol: 46.7 ml 25.13 ml/m  AORTIC VALVE LVOT Vmax:   94.20 cm/s LVOT Vmean:  70.600 cm/s LVOT VTI:  0.201 m  AORTA Ao Root diam: 3.30 cm Ao Asc diam:  3.20 cm MITRAL VALVE                TRICUSPID VALVE MV Area (PHT): 6.17 cm     TR Peak grad:   43.3 mmHg MV Decel Time: 123 msec     TR Vmax:        329.00 cm/s MV E velocity: 115.00 cm/s MV A velocity: 31.20 cm/s   SHUNTS MV E/A ratio:  3.69         Systemic VTI:  0.20 m                             Systemic Diam: 2.00 cm Mertie Moores MD Electronically signed by Mertie Moores MD Signature Date/Time: 06/28/2020/10:18:42 AM    Final     Cardiac Studies   Cardioversion 06/29/20 Indication:   Symptomatic atrial fibrillation/flutter  Procedure Note:  The patient signed informed consent.  They have had had therapeutic anticoagulation with apixaban greater than 3 weeks.  Anesthesia was administered by Dr. Glennon Mac.  Patient received 20 mg IV lidocaine and 60 mg IV propofol.Adequate airway was maintained throughout and vital followed per protocol.  They were cardioverted x 2 with 120, 150J of biphasic synchronized energy.  They converted to atrial paced, ventricular paced rhythm. This was confirmed via Occupational hygienist.  There were no apparent complications.  The patient had normal neuro status and respiratory status post procedure with vitals stable as recorded elsewhere.    Follow up:  They will continue on current medical therapy and follow up with cardiology as scheduled.  PPM GENERATOR CHANGEOUT 06/29/20  Reason for procedure:  1. Device generator at elective replacement interval  2. Complete heart block Procedure performed by:  Sanda Klein, MD  Complications:  None  Estimated blood loss:  <5 mL  Medications administered during procedure:  Ancef 2 g intravenously, lidocaine 1% 30 mL locally, fentanyl 25 mcg intravenously  Device details:   [place] number 2272, serial number [id number] Right atrial lead (chronic) St. Jude passive plus, model number [id number], serial number [id number] (implanted 05/11/1997) Right ventricular lead (chronic)  St. Jude passive plus, model number [id number], serial number [id number] (implanted 05/11/1997)  Explanted generator St. Jude Accent,  model number [id number]    Echo 06/28/20 1. Left ventricular ejection fraction, by estimation, is 60 to 65%. The  left ventricle has normal function. The left ventricle has no regional  wall motion abnormalities. Left ventricular diastolic parameters are  indeterminate.  2. Right ventricular systolic function is normal. The right ventricular  size is small. There is moderately  elevated pulmonary artery systolic  pressure.  3. Right atrial size was mildly dilated.  4. The mitral valve is myxomatous. Mild mitral valve regurgitation. There  is mild prolapse of anterior leaflet of the mitral valve.  5. The aortic valve is normal in structure. Aortic valve regurgitation is  not visualized. No aortic stenosis is present.    Patient Profile     86 y.o.femalewithh/o SSS, CHB (pacemaker dependent,dual-chamber permanent pacemaker-St. XFGH,8299), paroxysmal atrial tachycardia and paroxysmal atrial fibrillation, hypertension, hyperlipidemia, treated hypothyroidism but no significant known structural heart diseasepresented for worsening dyspnea and LE edema. Also noted palpitations.  PM interrogation in the ER shows atrial fibrillation since the beginning of September and EOL for the PM battery.  Assessment & Plan    1. Acute on chronic  diastolic CHF - Likely due to afib RVR. BNP > 1000. - Given IV lasix 40mg  daily x 2 on admit. However BUN/SCr worsen 19/1.89>>22/1.8>>34/2.05. Renal function continue to worse today at 44>>2.48 despite holding HCTZ, ARB and lasix. Appears euvolemic.   - Echo with preserved LVEF. No WM abnormality - Total diuresed 3.6L - Will review plan with MD  2. PAF - Likely contributing to heart failure - S/p cardioverted x 2 with 120, 150J of biphasic synchronized energy. - AV paced rhythm - Continue Eliquis, Amiodarone and atenolol  3. CHB s/p PPM - S/p  generator change yesterday   4. HTN - BP stable - Continue Atenolol - Held  Irbesartan and HCTZ  5. Acute on CKD III - Baseline Scr ~1.8 - Renal function continues to worse to 2.48 today despite holding ARB, lasix and HCTZ - Will review with MD  6. Hypothyroidism - TSH minimally up at 4.508 - continue home dose of synthroid 138mcg qd - Follow up with PCP post discharge  7. Leg pain with weakness L > R - Continue gabapentin - Pending PT evaluation   For  questions or updates, please contact [facility] Please consult [email or web address] for contact info under        SignedLeanor Kail, PA  06/30/2020, 8:58 AM    Personally seen and examined. Agree with above.   Ultimately, would like for her to be able to go home however she is having trouble placing weight on her does have underlying neuropathy.  PT is evaluating currently.  Pacemaker changed out successful, Dr. Lorenza Cambridge functioning well.  Successful cardioversion.  Continue with amiodarone low-dose.  Eliquis low-dose.  Holding diuretics given increase in creatinine.  Candee Furbish, MD

## 2020-06-30 NOTE — TOC Progression Note (Signed)
Transition of Care [facility]) - Progression Note    Patient Details  Name: Jill Washington MRN: [id number] Date of Birth: Aug 27, 1926  Transition of Care [facility]) CM/SW [place], [place] Phone Number: 06/30/2020, 4:16 PM  Clinical Narrative:     CSW spoke with [name] and [name] would like first choice to be [facility] for SNF choice. CSW faxed out initial referral to [facility] in [place]. [name] would like CSW to follow up with her with SNF bed offers when they come in.   Pending bed offers.  CSW will continue to follow.  Expected Discharge Plan: [place] Barriers to Discharge: Continued Medical Work up  Expected Discharge Plan and Services Expected Discharge Plan: [place] arrangements for the past 2 months: Single Family Home                                       Social Determinants of Health (SDOH) Interventions    Readmission Risk Interventions No flowsheet data found.

## 2020-06-30 NOTE — Evaluation (Signed)
Physical Therapy Evaluation Patient Details Name: Jill Washington MRN: [id number] DOB: 1926-09-04 Today's Date: 06/30/2020   History of Present Illness  86 y.o. female admitted on 06/27/20 for SOB, LE edema, cough.  Pt's pacemaker was interrogated and pt underwent battery change out and cardioversion on 06/29/20.  Pt dx with acute on chronic CHF, PAF.  Pt with significant PMH of peripheral neuropathy, pacemaker, MVP, HTN, fibromyalgia, complete heart block, and cervical spine surgery.  Clinical Impression  Pt was able to come to EOB with minimal assistance, however, was unable to stand from even an elevated bed to RW fully with multiple attempts.  She is in need of post acute rehab at discharge before returning home alone with daughter's intermittent assist.   PT to follow acutely for deficits listed below.     Follow Up Recommendations SNF;Other (comment) (pt has been to SNF rehab before)    Equipment Recommendations  None recommended by PT    Recommendations for Other Services OT consult     Precautions / Restrictions Precautions Precautions: Fall Precaution Comments: h/o falls, but none recently per pt report (months since her last fall)      Mobility  Bed Mobility Overal bed mobility: Modified Independent             General bed mobility comments: heavy use of rail and extra time needed as well as HOB elevated.   Transfers Overall transfer level: Needs assistance Equipment used: Rolling walker (2 wheeled) Transfers: Sit to/from Stand Sit to Stand: Mod assist;From elevated surface         General transfer comment: Attempted x2 with increased bed height with second attempt, able to squat, but not get to full standing even assisted with RW.  Pt is tremorous (almost drop-like) in arms and legs with movement.    Ambulation/Gait             General Gait Details: unable and she is a mod I household ambulator at baseline.   Stairs            Wheelchair  Mobility    Modified Rankin (Stroke Patients Only)       Balance Overall balance assessment: Needs assistance Sitting-balance support: Feet supported;Bilateral upper extremity supported Sitting balance-Leahy Scale: Poor Sitting balance - Comments: unable to remove UE support in sitting EOB.    Standing balance support: Bilateral upper extremity supported Standing balance-Leahy Scale: Poor Standing balance comment: needs external support of walker and therapist to get to 1/2 standing.                              Pertinent Vitals/Pain Pain Assessment: Faces Faces Pain Scale: Hurts even more Pain Location: right leg, feet to touch Pain Descriptors / Indicators: Burning Pain Intervention(s): Limited activity within patient's tolerance;Monitored during session;Repositioned    Home Living Family/patient expects to be discharged to:: Private residence Living Arrangements: Alone Available Help at Discharge: Family;Available PRN/intermittently Type of Home: House Home Access: Level entry     Home Layout: One level Home Equipment: Walker - 2 wheels;Shower seat      Prior Function Level of Independence: Independent with assistive device(s)         Comments: uses RW for gait, cooks microwave meals and does her own bathing seated in the shower.  Daughter drives to get her groceries and puts them away.       Hand Dominance   Dominant Hand: Right  Extremity/Trunk Assessment   Upper Extremity Assessment Upper Extremity Assessment: Defer to OT evaluation    Lower Extremity Assessment Lower Extremity Assessment: RLE deficits/detail;LLE deficits/detail RLE Deficits / Details: right leg is weaker than L leg with difficult raising hip against gravity to get it out of the bed. 3/5 RLE Sensation: history of peripheral neuropathy LLE Deficits / Details: left leg generally weak, but stronger than R leg with at least 3/5 strength per gross supine and seated functional  assessment.  LLE Sensation: history of peripheral neuropathy    Cervical / Trunk Assessment Cervical / Trunk Assessment: Other exceptions;Kyphotic Cervical / Trunk Exceptions: PMH of cervical spine surgery  Communication   Communication: HOH  Cognition Arousal/Alertness: Awake/alert Behavior During Therapy: WFL for tasks assessed/performed Overall Cognitive Status: No family/caregiver present to determine baseline cognitive functioning                                 General Comments: Mildly impaired, however, may be due to poor hearing.       General Comments General comments (skin integrity, edema, etc.): Pt too fatigued to preform exercises after standing attempts, requesting to lay back down.  BP soft but stable in sitting and HR 72 paced.     Exercises     Assessment/Plan    PT Assessment Patient needs continued PT services  PT Problem List Decreased strength;Decreased activity tolerance;Decreased balance;Decreased mobility;Decreased knowledge of use of DME;Pain       PT Treatment Interventions DME instruction;Gait training;Stair training;Functional mobility training;Therapeutic activities;Neuromuscular re-education;Therapeutic exercise;Balance training;Patient/family education    PT Goals (Current goals can be found in the Care Plan section)  Acute Rehab PT Goals Patient Stated Goal: to go to rehab and get stronger PT Goal Formulation: With patient Time For Goal Achievement: 07/14/20 Potential to Achieve Goals: Good    Frequency Min 2X/week   Barriers to discharge        Co-evaluation               AM-PAC PT "6 Clicks" Mobility  Outcome Measure Help needed turning from your back to your side while in a flat bed without using bedrails?: A Little Help needed moving from lying on your back to sitting on the side of a flat bed without using bedrails?: A Little Help needed moving to and from a bed to a chair (including a wheelchair)?:  Total Help needed standing up from a chair using your arms (e.g., wheelchair or bedside chair)?: A Lot Help needed to walk in hospital room?: Total Help needed climbing 3-5 steps with a railing? : Total 6 Click Score: 11    End of Session   Activity Tolerance: Patient limited by fatigue;Patient limited by pain Patient left: in bed;with call bell/phone within reach;with bed alarm set   PT Visit Diagnosis: Muscle weakness (generalized) (M62.81);Difficulty in walking, not elsewhere classified (R26.2);Pain Pain - Right/Left: Right Pain - part of body: Leg    Time: 1791-5056 PT Time Calculation (min) (ACUTE ONLY): 25 min   Charges:   PT Evaluation $PT Eval Moderate Complexity: 1 Mod PT Treatments $Therapeutic Activity: 8-22 mins       Verdene Lennert, PT, DPT  Acute Rehabilitation [phone] pager [phone]) [phone] office

## 2020-06-30 NOTE — TOC Initial Note (Signed)
Transition of Care [facility]) - Initial/Assessment Note    Patient Details  Name: Jill Washington MRN: [id number] Date of Birth: 11/24/1925  Transition of Care [facility]) CM/SW Contact:    Trula Ore, [place] Phone Number: 06/30/2020, 2:51 PM  Clinical Narrative:                  CSW received consult for possible SNF placement at time of discharge. CSW spoke with patient at bedside regarding PT recommendation of SNF placement at time of discharge. Patient expressed understanding of PT recommendation and is agreeable to SNF placement at time of discharge. Patient reports preference for [facility] . Patient stated she has been there before. Patient gave CSW permission to fax out initial referral also near where she lives. Patient gave CSW permission to discuss her care with her daughter [name].Patient has received the COVID vaccines. No further questions reported at this time. CSW to continue to follow and assist with discharge planning needs.  Expected Discharge Plan: Skilled Nursing Facility Barriers to Discharge: Continued Medical Work up   Patient Goals and CMS Choice Patient states their goals for this hospitalization and ongoing recovery are:: to go to SNF CMS Medicare.gov Compare Post Acute Care list provided to:: Patient Choice offered to / list presented to : Patient  Expected Discharge Plan and Services Expected Discharge Plan: [place]       Living arrangements for the past 2 months: Single Family Home                                      Prior Living Arrangements/Services Living arrangements for the past 2 months: Single Family Home Lives with:: Self Patient language and need for interpreter reviewed:: Yes Do you feel safe going back to the place where you live?: No   SNF  Need for Family Participation in Patient Care: Yes (Comment) Care giver support system in place?: Yes (comment)   Criminal Activity/Legal Involvement Pertinent to Current  Situation/Hospitalization: No - Comment as needed  Activities of Daily Living Home Assistive Devices/Equipment: Walker (specify type), Cane (specify quad or straight) ADL Screening (condition at time of admission) Patient's cognitive ability adequate to safely complete daily activities?: Yes Is the patient deaf or have difficulty hearing?: No Does the patient have difficulty seeing, even when wearing glasses/contacts?: Yes Does the patient have difficulty concentrating, remembering, or making decisions?: No Patient able to express need for assistance with ADLs?: Yes Does the patient have difficulty dressing or bathing?: No Independently performs ADLs?: Yes (appropriate for developmental age) Does the patient have difficulty walking or climbing stairs?: Yes Weakness of Legs: Both Weakness of Arms/Hands: None  Permission Sought/Granted Permission sought to share information with : Case Manager, Family Supports, Chartered certified accountant granted to share information with : Yes, Verbal Permission Granted  Share Information with NAME: [name]  Permission granted to share info w AGENCY: SNF  Permission granted to share info w Relationship: daughter  Permission granted to share info w Contact Information: [name] [phone]  Emotional Assessment Appearance:: Appears stated age Attitude/Demeanor/Rapport: Gracious Affect (typically observed): Calm Orientation: : Oriented to Self, Oriented to Place, Oriented to  Time, Oriented to Situation Alcohol / Substance Use: Not Applicable Psych Involvement: No (comment)  Admission diagnosis:  Pacemaker [Z95.0] Elevated troponin [R77.8] Acute CHF (congestive heart failure) ([place]) [I50.9] Acute on chronic congestive heart failure, unspecified heart failure type ([place]) [I50.9] Patient  Active Problem List   Diagnosis Date Noted  . Atrial flutter ([place])   . Acute CHF (congestive heart failure) ([place]) 06/27/2020  . Renal insufficiency  05/25/2018  . Urinary frequency 05/25/2018  . Hyperkalemia 01/25/2018  . Abdominal pain 04/25/2017  . Diarrhea 04/25/2017  . SSS (sick sinus syndrome) ([place]) 10/23/2016  . Knee pain, bilateral 05/31/2016  . Medicare annual wellness visit, subsequent 05/15/2015  . CHB (complete heart block) ([place]) 11/10/2014  . Pedal edema 04/10/2014  . History of shingles   . Long term current use of anticoagulant therapy 01/30/2013  . Pacemaker -  [place] leads implanted in 1998 generator change 2006 and 2013 07/31/2011  . Back pain 05/07/2011  . Overactive bladder 05/07/2011  . B12 deficiency 01/22/2011  . Anemia 01/22/2011  . Irritable bowel syndrome 04/19/2010  . Vitamin D deficiency 10/17/2009  . Hypercholesterolemia 10/17/2009  . Critical illness polyneuropathy ([place]) 06/23/2008  . Atrial fibrillation ([place]) 06/23/2008  . Venous (peripheral) insufficiency 06/23/2008  . History of colon polyps 02/29/2008  . Hypothyroidism 02/29/2008  . PERIPHERAL NEUROPATHY 02/29/2008  . Essential hypertension 02/29/2008  . MITRAL VALVE PROLAPSE 02/29/2008  . History of sick sinus syndrome 02/29/2008  . Hiatal hernia with GERD 02/29/2008  . FIBROMYALGIA 02/29/2008  . SKIN CANCER, HX OF 02/29/2008   PCP:  Mosie Lukes, MD Pharmacy:   [facility], [place] [address] [place] [address] Phone: [phone] Fax: [place], [place] - [address] [place] [address] Phone: [phone] Fax: [phone]     Social Determinants of Health (SDOH) Interventions    Readmission Risk Interventions No flowsheet data found.

## 2020-07-01 LAB — CBC
HCT: 29.5 % — ABNORMAL LOW (ref 36.0–46.0)
Hemoglobin: 9.2 g/dL — ABNORMAL LOW (ref 12.0–15.0)
MCH: 29.4 pg (ref 26.0–34.0)
MCHC: 31.2 g/dL (ref 30.0–36.0)
MCV: 94.2 fL (ref 80.0–100.0)
Platelets: 156 10*3/uL (ref 150–400)
RBC: 3.13 MIL/uL — ABNORMAL LOW (ref 3.87–5.11)
RDW: 12.2 % (ref 11.5–15.5)
WBC: 8 10*3/uL (ref 4.0–10.5)
nRBC: 0 % (ref 0.0–0.2)

## 2020-07-01 LAB — BASIC METABOLIC PANEL
Anion gap: 10 (ref 5–15)
BUN: 57 mg/dL — ABNORMAL HIGH (ref 8–23)
CO2: 29 mmol/L (ref 22–32)
Calcium: 8.7 mg/dL — ABNORMAL LOW (ref 8.9–10.3)
Chloride: 100 mmol/L (ref 98–111)
Creatinine, Ser: 3.01 mg/dL — ABNORMAL HIGH (ref 0.44–1.00)
GFR calc Af Amer: 15 mL/min — ABNORMAL LOW (ref >60)
GFR calc non Af Amer: 13 mL/min — ABNORMAL LOW (ref >60)
Glucose, Bld: 101 mg/dL — ABNORMAL HIGH (ref 70–99)
Potassium: 4 mmol/L (ref 3.5–5.1)
Sodium: 139 mmol/L (ref 135–145)

## 2020-07-01 MED ORDER — SODIUM CHLORIDE 0.9 % IV BOLUS
500.0000 mL | Freq: Once | INTRAVENOUS | Status: AC
  Administered 2020-07-01: 500 mL via INTRAVENOUS

## 2020-07-01 NOTE — TOC Progression Note (Signed)
Transition of Care [facility]) - Progression Note    Patient Details  Name: Jill Washington MRN: [id number] Date of Birth: 1925-10-22  Transition of Care [facility]) CM/SW [place], [place] Phone Number: [phone] 07/01/2020, 1:28 PM  Clinical Narrative:     CSW checked hub for bed offers. Patient currently does not have any bed offers.  TOC team will continue to assist with discharge planning needs.  Expected Discharge Plan: [place] Barriers to Discharge: Continued Medical Work up  Expected Discharge Plan and Services Expected Discharge Plan: [place] arrangements for the past 2 months: Single Family Home                                       Social Determinants of Health (SDOH) Interventions    Readmission Risk Interventions No flowsheet data found.

## 2020-07-01 NOTE — Progress Notes (Signed)
Progress Note  Patient Name: Jill Washington Date of Encounter: 07/01/2020  [facility] Cardiologist: Sanda Klein, MD   Subjective   Feels fairly well this morning.  States that she is mildly wheezy.  No chest pain.  Blood pressure this morning was in the 96/44 range.  Atenolol was held.  Inpatient Medications    Scheduled Meds: . amiodarone  100 mg Oral Daily  . apixaban  2.5 mg Oral BID  . atenolol  75 mg Oral Daily  . cholecalciferol  1,000 Units Oral Daily  . feeding supplement (ENSURE ENLIVE)  237 mL Oral BID BM  . gabapentin  300 mg Oral BID  . levothyroxine  137 mcg Oral QAC breakfast  . mirabegron ER  50 mg Oral Daily  . multivitamin with minerals  1 tablet Oral Daily  . pantoprazole  40 mg Oral Daily  . pneumococcal 23 valent vaccine  0.5 mL Intramuscular Tomorrow-1000  . pravastatin  40 mg Oral Daily  . sodium chloride flush  3 mL Intravenous Q12H   Continuous Infusions: . sodium chloride    . sodium chloride     PRN Meds: sodium chloride, acetaminophen, ondansetron (ZOFRAN) IV, polyvinyl alcohol, sodium chloride flush   Vital Signs    Vitals:   06/30/20 2122 07/01/20 0405 07/01/20 0625 07/01/20 0741  BP: (!) 105/39 93/60  (!) 96/44  Pulse: (!) 57   70  Resp: 20 20  16   Temp: 98.8 F (37.1 C) 97.6 F (36.4 C)  (!) 97.3 F (36.3 C)  TempSrc: Oral Oral  Oral  SpO2: 92% 96%  96%  Weight:   81 kg   Height:        Intake/Output Summary (Last 24 hours) at 07/01/2020 1029 Last data filed at 07/01/2020 0500 Gross per 24 hour  Intake 480 ml  Output 200 ml  Net 280 ml   Last 3 Weights 07/01/2020 06/30/2020 06/29/2020  Weight (lbs) 178 lb 9.2 oz 175 lb 0.7 oz 171 lb 8.3 oz  Weight (kg) 81 kg 79.4 kg 77.8 kg      Telemetry    Pacing- Personally Reviewed    Physical Exam   GEN:  Elderly no acute distress.   Neck: No JVD Cardiac: RRR, no murmurs, rubs, or gallops.  Respiratory: Clear to auscultation bilaterally. GI: Soft, nontender,  non-distended  MS: No edema; No deformity. Neuro:  Nonfocal  Psych: Normal affect   Labs    High Sensitivity Troponin:   Recent Labs  Lab 06/27/20 1043 06/27/20 1822  TROPONINIHS 33* 39*      Chemistry Recent Labs  Lab 06/29/20 0500 06/30/20 0513 07/01/20 0517  NA 140 135 139  K 4.1 4.0 4.0  CL 100 99 100  CO2 33* 29 29  GLUCOSE 93 99 101*  BUN 34* 44* 57*  CREATININE 2.05* 2.48* 3.01*  CALCIUM 9.6 8.7* 8.7*  GFRNONAA 21* 16* 13*  GFRAA 24* 19* 15*  ANIONGAP 7 7 10      Hematology Recent Labs  Lab 06/27/20 1043 07/01/20 0517  WBC 11.0* 8.0  RBC 3.81* 3.13*  HGB 11.3* 9.2*  HCT 36.4 29.5*  MCV 95.5 94.2  MCH 29.7 29.4  MCHC 31.0 31.2  RDW 12.4 12.2  PLT 205 156    BNP Recent Labs  Lab 06/27/20 1824  BNP 1,310.8*     DDimer No results for input(s): DDIMER in the last 168 hours.   Radiology    No results found.  Cardiac Studies  Echo with EF 60%  Patient Profile     86 y.o. female pacemaker generator change out, A. fib RVR status post cardioversion.  Assessment & Plan    Paroxysmal atrial fibrillation -Status post cardioversion  Pacemaker -Generator change out Dr. Lorenza [place].  Functioning well.  Hypotension -Blood pressure 96/44 this morning. -HCTZ, Lasix held -Acute kidney injury noted with worsening renal function again today.  Likely a result of prolonged atrial fibrillation with RVR and hypoperfusion. -I will give her back normal saline 500 mL IV. -She feels well ate a good breakfast she states.  Awaiting skilled nursing facility placement.  For questions or updates, please contact [place] Please consult [email or web address] for contact info under        Signed, Candee Furbish, MD  07/01/2020, 10:29 AM

## 2020-07-01 NOTE — Progress Notes (Signed)
Pt bp 96/44 followed up with RN caring for the patient. Requested that she reach out to provider to discuss b/p before giving Atenolol. Repeat B/P 94/59

## 2020-07-02 ENCOUNTER — Other Ambulatory Visit: Payer: Self-pay | Admitting: Cardiovascular Disease

## 2020-07-02 DIAGNOSIS — N179 Acute kidney failure, unspecified: Secondary | ICD-10-CM

## 2020-07-02 DIAGNOSIS — N189 Chronic kidney disease, unspecified: Secondary | ICD-10-CM

## 2020-07-02 DIAGNOSIS — I484 Atypical atrial flutter: Secondary | ICD-10-CM

## 2020-07-02 DIAGNOSIS — N10 Acute tubulo-interstitial nephritis: Secondary | ICD-10-CM

## 2020-07-02 LAB — BASIC METABOLIC PANEL
Anion gap: 10 (ref 5–15)
BUN: 62 mg/dL — ABNORMAL HIGH (ref 8–23)
CO2: 28 mmol/L (ref 22–32)
Calcium: 9 mg/dL (ref 8.9–10.3)
Chloride: 99 mmol/L (ref 98–111)
Creatinine, Ser: 2.63 mg/dL — ABNORMAL HIGH (ref 0.44–1.00)
GFR calc Af Amer: 18 mL/min — ABNORMAL LOW (ref >60)
GFR calc non Af Amer: 15 mL/min — ABNORMAL LOW (ref >60)
Glucose, Bld: 97 mg/dL (ref 70–99)
Potassium: 4.9 mmol/L (ref 3.5–5.1)
Sodium: 137 mmol/L (ref 135–145)

## 2020-07-02 LAB — SARS CORONAVIRUS 2 (TAT 6-24 HRS): SARS Coronavirus 2: NEGATIVE

## 2020-07-02 MED ORDER — MIRABEGRON ER 25 MG PO TB24: 25.0000 mg | ORAL_TABLET | Freq: Every day | ORAL | Status: AC

## 2020-07-02 MED ORDER — PANTOPRAZOLE SODIUM 40 MG PO TBEC: 40.0000 mg | DELAYED_RELEASE_TABLET | Freq: Every day | ORAL | Status: AC

## 2020-07-02 NOTE — Progress Notes (Signed)
   Update: Patient would like to go to [facility] but we do not have a bed offer yet. Doubtful that we will get a bed offer today but discharge summary/order has been completed just in case. Ordered Covid test.   Darreld Mclean, PA-C 07/02/2020 12:46 PM

## 2020-07-02 NOTE — Discharge Summary (Addendum)
Discharge Summary    Patient ID: LILYTH LAWYER MRN: [id number]; DOB: 1925/12/27  Admit date: 06/27/2020 Discharge date: 07/02/2020  Primary Care Provider: Mosie Lukes, MD  Primary Cardiologist: Sanda Klein, MD  Primary Electrophysiologist:  None   Discharge Diagnoses    Principal Problem:   Acute diastolic CHF (congestive heart failure) ([place]) Active Problems:   Hypothyroidism   Hypercholesterolemia   Essential hypertension   Anemia   CHB (complete heart block) ([facility])   Paroxysmal atrial fibrillation ([place])   Acute-on-chronic kidney injury [facility])    Diagnostic Studies/Procedures    Echocardiogram 06/28/2020: Impressions: 1. Left ventricular ejection fraction, by estimation, is 60 to 65%. The  left ventricle has normal function. The left ventricle has no regional  wall motion abnormalities. Left ventricular diastolic parameters are  indeterminate.  2. Right ventricular systolic function is normal. The right ventricular  size is small. There is moderately elevated pulmonary artery systolic  pressure.  3. Right atrial size was mildly dilated.  4. The mitral valve is myxomatous. Mild mitral valve regurgitation. There  is mild prolapse of anterior leaflet of the mitral valve.  5. The aortic valve is normal in structure. Aortic valve regurgitation is  not visualized. No aortic stenosis is present. _____________  PPM Generator Change 06/29/2020: Procedure Performed: Dual chamber pacemaker generator changeout  Aegis pouch  Reason for Procedure:  1. Device generator at elective replacement interval  2. Complete heart block Procedure Performed by: Sanda Klein, MD  Complications:  None  Estimated Blood Loss:  <5 mL  Medications Administered During Procedure:  Ancef 2 g intravenously, lidocaine 1% 30 mL locally, fentanyl 25 mcg intravenously  Device Details:  [employer]. Jude Assurity model number 2272, serial number [id number] Right atrial lead  (chronic) St. Jude passive plus, model number [id number], serial number [id number] (implanted 05/11/1997) Right ventricular lead (chronic)  St. Jude passive plus, model number [id number], serial number [id number] (implanted 05/11/1997)  Explanted generator St. Jude Accent,  model number [id number]  Procedure Details:  After the risks and benefits of the procedure were discussed the patient provided informed consent. She was brought to the cardiac catheter lab in the fasting state. The patient was prepped and draped in usual sterile fashion. Local anesthesia with 1% lidocaine was administered to to the left infraclavicular area. A 5-6cm horizontal incision was made parallel with and 2-3 cm caudal to the right clavicle, in the area of an old scar. An older scar was seen closer to the clavicle. Using minimal electrocautery and mostly sharp and blunt dissection the prepectoral pocket was opened carefully to avoid injury to the loops of chronic leads. Extensive dissection was not necessary. The device was explanted. The pocket was carefully inspected for hemostasis and flushed with copious amounts of antibiotic solution.  The leads were disconnected from the old generator and testing of the lead parameters later showed excellent values. The new generator was connected to the chronic leads, with appropriate pacing noted. The device was placed in the presoaked Aegis pouch.  The entire system was then carefully inserted in the pocket with care been taking that the leads and device assumed a comfortable position without pressure on the incision. Great care was taken that the leads be located deep to the generator. The pocket was then closed in layers using 2 layers of 2-0 Vicryl, one layer of 3-0 vicryl, Steristrips after which a sterile dressing was applied.   At the end of the procedure the following lead parameters were encountered:  Right atrial lead sensed P waves 2 mV (AFib), impedance 440 ohms, threshold not  tested.  Right ventricular lead sensed R waves  >12 mV, impedance 540 ohms, threshold 0.75 at 0.5 ms pulse width.    History of Present Illness     Jill Washington is a 86 y.o. female with a history of sick sinus syndrome/complete heart block pacemaker dependent s/p dual chamber St. Jude PPM in 2013, paroxsymal atrial tachycardia, paroxysmal atrial fibrillation on Eliquis, hypertension, hyperlipidemia, hypothyroidism, and CKD stage IV who is followed by Dr. Sallyanne Kuster.  Patient presented to the ED on 06/27/2020 with reports of worsening shortness of breath in the last couple of weeks, now at rest. She also noted worsening lower extremity edema, PND, and cough. Patient is very hard of hearing so detailed history was hard to obtain. However, she did state that her heart rate had been elevated lately and goes even higher with any activity. Her symptoms have been worsening with increased heart rate.   In the ED, BNP elevated at 1,310. High-sensitivity troponin mininally elevated and flat at 33 >> 39. Chest x-ray showed no acute findings. Patient was admitted with acute on chronic diastolic CHF. WBC 11.0, Hgb 11.3, Plts 205. Na 140, K 4.7, Glucose 113, Cr 1.89 (baseline 1.8 to 2.0). Patient admitted for acute on chronic diastolic CHF.  Hospital Course     Consultants: None.  Acute on Chronic Diastolic CHF Felt to be secondary to atrial fibrillation with RVR. BNP elevated at 1,310. Chest x-ray showed no acute findings. Echo showed LVEF of 60-65% with normal wall motion. RV is small with normal systolic function and moderately elevated PASP. Patient received 2 doses of IV Lasix and then this was stopped due to worsening renal function. Creatinine peaked at 3.01 on 9/18. Improved to 2.63 on day of discharge. Net negative 3.5 L this admission. Discharge weight 184 lbs, up from 172 lbs on admission but unsure if this weight is accurate. Will hold Lasix and home Benicar-HCTZ on discharge. Recommend daily  weights and sodium/fluid restriction at SNF.  Demand Ischemia High-sensitivity troponin minimally elevated and flat at 33 >> 39. Not consistent with ACS. Likely demand ischemia in setting of CHF and atrial fibrillation with RVR.  Paroxysmal Atrial Fibrillation Patient presented in atrial fibrillation. Electrolytes normal. TSH very slightly elevated at 4.508 (upper limit of normal is 4.5). Free T4 was not checked. Underwent DCCV x2 with 120 and then 150 J on 06/29/2020 and converted back to AV paced rhythm which was confirmed via Occupational hygienist. TSH very slightly elevated at 4.508 (upper limit of normal is 4.5). Free T4 was not checked. Home Atenolol stopped due to soft BP. Continue home Amiodarone 175m daily and Eliquis 2.525mtwice daily (reduced dose due to age and renal function).   Complete Heart Block s/p Dual Chamber St. Jude PPM Given device was nearing end of life, patient underwent generator changeout with Dr. CrSallyanne Kustern 06/29/2020. Patient tolerated procedure well without any complications. She already has follow-up with Dr. CrSallyanne Kustercheduled for 07/13/2020.  Hypertension History of hypertension but BP has been soft. Home Lasix, Benicar-HCTZ, and Atenolol held. Continue to monitor at SNF.  Hyperlipidemia Continue home Pravastatin.  Hypothyroidism Continue home Synthroid.  Acute on CKD Stage III Creatinine 1.89 on admission and peaked at 3.01 after diuresis. Received 500 mL of fluid on 9/18 with improvement in creatinine to 2.63 on day of discharge. Continue to hold home Lasix and Benicar-HCTZ on discharge. Patient will need repeat BMET  in 3 [facility].  Anemia Hemoglobin 11.3 on admission and 9.2 on 07/01/2020. Baseline around 11 to 12 range. No overt bleeding noticed. Can repeat CBC at follow-up visit.  Of note, patient on Myrebetriq at home for overactive bladder. Per UpToDate, if eGFR between 15 and <30, patient should not exceed 52m once daily. Therefore, will reduce home  dose from 568mdaily to 2532maily.  Deconditioning/Weakness Leg Pain Patient has known neuropathy and did complain of some leg pain during admission but stated this was not new. However, she was having trouble placing weight on left leg. Continue Gabapentin. PT was consulted and recommended SNF. Patient will be discharged to [facility].  Patient seen and examined today by Dr. SkaMarlou Porchd determined to be stable for discharge. Outpatient follow-up arranged. Medications as below.  Did the patient have an acute coronary syndrome (MI, NSTEMI, STEMI, etc) this admission?:  No.   The elevated Troponin was due to the acute medical illness (demand ischemia).  _____________  Discharge Vitals Blood pressure 105/68, pulse 70, temperature 98 F (36.7 C), temperature source Oral, resp. rate 20, height _0  (1.651 m), weight 83.5 kg, SpO2 92 %.  Filed Weights   06/30/20 0534 07/01/20 0625 07/02/20 0444  Weight: 79.4 kg 81 kg 83.5 kg   Elderly GEN:No acute distress.   Neck:No JVD Cardiac:RRR, no murmurs, rubs, or gallops. Pacemaker site normal. Respiratory:Clear to auscultation bilaterally. GI:ZT:IWPYontender, non-distended  MS:No edema; No deformity. Neuro:Nonfocal  Psych: Normal affect   Labs & Radiologic Studies    CBC Recent Labs    07/01/20 0517  WBC 8.0  HGB 9.2*  HCT 29.5*  MCV 94.2  PLT [id number] Metabolic Panel Recent Labs    07/01/20 0517 07/02/20 0754  NA 139 137  K 4.0 4.9  CL 100 99  CO2 29 28  GLUCOSE 101* 97  BUN 57* 62*  CREATININE 3.01* 2.63*  CALCIUM 8.7* 9.0   Liver Function Tests No results for input(s): AST, ALT, ALKPHOS, BILITOT, PROT, ALBUMIN in the last 72 hours. No results for input(s): LIPASE, AMYLASE in the last 72 hours. High Sensitivity Troponin:   Recent Labs  Lab 06/27/20 1043 06/27/20 1822  TROPONINIHS 33* 39*    BNP Invalid input(s): POCBNP D-Dimer No results for input(s): DDIMER in the last 72 hours. Hemoglobin A1C No  results for input(s): HGBA1C in the last 72 hours. Fasting Lipid Panel No results for input(s): CHOL, HDL, LDLCALC, TRIG, CHOLHDL, LDLDIRECT in the last 72 hours. Thyroid Function Tests No results for input(s): TSH, T4TOTAL, T3FREE, THYROIDAB in the last 72 hours.  Invalid input(s): FREET3 _____________  DG Chest 2 View  Result Date: 06/27/2020 CLINICAL DATA:  Chest pain.  Shortness of breath. EXAM: CHEST - 2 VIEW COMPARISON:  02/09/2020 FINDINGS: Normal heart size and mediastinal contours. Dual-chamber pacer leads from the right in unremarkable position. Mild scarring in the right lung and costophrenic sulci. There is no edema, consolidation, effusion, or pneumothorax. IMPRESSION: No acute finding when compared with prior. Electronically Signed   By: JonMonte FantasiaD.   On: 06/27/2020 11:15   EP PPM/ICD IMPLANT  Result Date: 06/29/2020 Procedure performed: Dual chamber pacemaker generator changeout Aegis pouch Reason for procedure: 1. Device generator at elective replacement interval 2. Complete heart block Procedure performed by: MihSanda KleinD Complications: None Estimated blood loss: <5 mL Medications administered during procedure: Ancef 2 g intravenously, lidocaine 1% 30 mL locally, fentanyl 25 mcg intravenously Device details: [employer]  Jude Assurity model number 2272, serial number [id number] Right atrial lead (chronic) St. Jude passive plus, model number [id number], serial number [id number] (implanted 05/11/1997) Right ventricular lead (chronic)  St. Jude passive plus, model number [id number], serial number [id number] (implanted 05/11/1997) Explanted generator St. Jude Accent,  model number [id number] Procedure details: After the risks and benefits of the procedure were discussed the patient provided informed consent. She was brought to the cardiac catheter lab in the fasting state. The patient was prepped and draped in usual sterile fashion. Local anesthesia with 1% lidocaine was administered to to  the left infraclavicular area. A 5-6cm horizontal incision was made parallel with and 2-3 cm caudal to the right clavicle, in the area of an old scar. An older scar was seen closer to the clavicle. Using minimal electrocautery and mostly sharp and blunt dissection the prepectoral pocket was opened carefully to avoid injury to the loops of chronic leads. Extensive dissection was not necessary. The device was explanted. The pocket was carefully inspected for hemostasis and flushed with copious amounts of antibiotic solution. The leads were disconnected from the old generator and testing of the lead parameters later showed excellent values. The new generator was connected to the chronic leads, with appropriate pacing noted. The device was placed in the presoaked Aegis pouch. The entire system was then carefully inserted in the pocket with care been taking that the leads and device assumed a comfortable position without pressure on the incision. Great care was taken that the leads be located deep to the generator. The pocket was then closed in layers using 2 layers of 2-0 Vicryl, one layer of 3-0 vicryl, Steristrips after which a sterile dressing was applied. At the end of the procedure the following lead parameters were encountered: Right atrial lead sensed P waves 2 mV (AFib), impedance 440 ohms, threshold not tested. Right ventricular lead sensed R waves  >12 mV, impedance 540 ohms, threshold 0.75 at 0.5 ms pulse width.  ECHOCARDIOGRAM COMPLETE  Result Date: 06/28/2020    ECHOCARDIOGRAM REPORT   Patient Name:   Jill Washington Date of Exam: 06/28/2020 Medical Rec #:  [id number]         Height:       65.0 in Accession #:    [id number]        Weight:       172.6 lb Date of Birth:  01/24/1926          BSA:          1.858 m Patient Age:    86 years          BP:           161/77 mmHg Patient Gender: F                 HR:           79 bpm. Exam Location:  Inpatient Procedure: 2D Echo, Cardiac Doppler and Color Doppler  Indications:    CHF-Acute Diastolic 540.08 / Q76.19  History:        Patient has prior history of Echocardiogram examinations, most                 recent 08/04/2015. Mitral Valve Prolapse, Arrythmias:Atrial                 Fibrillation; Risk Factors:Hypertension.  Sonographer:    Bernadene Person RDCS Referring Phys: [place]  1. Left ventricular ejection fraction, by estimation, is 60 to 65%. The  left ventricle has normal function. The left ventricle has no regional wall motion abnormalities. Left ventricular diastolic parameters are indeterminate.  2. Right ventricular systolic function is normal. The right ventricular size is small. There is moderately elevated pulmonary artery systolic pressure.  3. Right atrial size was mildly dilated.  4. The mitral valve is myxomatous. Mild mitral valve regurgitation. There is mild prolapse of anterior leaflet of the mitral valve.  5. The aortic valve is normal in structure. Aortic valve regurgitation is not visualized. No aortic stenosis is present. FINDINGS  Left Ventricle: Left ventricular ejection fraction, by estimation, is 60 to 65%. The left ventricle has normal function. The left ventricle has no regional wall motion abnormalities. The left ventricular internal cavity size was normal in size. There is  no left ventricular hypertrophy. Left ventricular diastolic parameters are indeterminate. Right Ventricle: The right ventricular size is small. Right vetricular wall thickness was not well visualized. Right ventricular systolic function is normal. There is moderately elevated pulmonary artery systolic pressure. The tricuspid regurgitant velocity is 3.29 m/s, and with an assumed right atrial pressure of 3 mmHg, the estimated right ventricular systolic pressure is 66.5 mmHg. Left Atrium: Left atrial size was normal in size. Right Atrium: Right atrial size was mildly dilated. Pericardium: There is no evidence of pericardial effusion. Mitral Valve: The  mitral valve is myxomatous. There is mild prolapse of anterior leaflet of the mitral valve. Mild mitral valve regurgitation. Tricuspid Valve: The tricuspid valve is normal in structure. Tricuspid valve regurgitation is mild. Aortic Valve: The aortic valve is normal in structure. There is mild aortic valve annular calcification. Aortic valve regurgitation is not visualized. No aortic stenosis is present. Pulmonic Valve: The pulmonic valve was grossly normal. Pulmonic valve regurgitation is not visualized. Aorta: The aortic root and ascending aorta are structurally normal, with no evidence of dilitation. IAS/Shunts: The atrial septum is grossly normal.  LEFT VENTRICLE PLAX 2D LVIDd:         4.60 cm  Diastology LVIDs:         3.30 cm  LV e' lateral:   9.00 cm/s LV PW:         1.00 cm  LV E/e' lateral: 12.8 LV IVS:        1.10 cm LVOT diam:     2.00 cm LV SV:         63 LV SV Index:   34 LVOT Area:     3.14 cm  RIGHT VENTRICLE RV S prime:     8.60 cm/s TAPSE (M-mode): 1.1 cm LEFT ATRIUM             Index       RIGHT ATRIUM           Index LA diam:        3.30 cm 1.78 cm/m  RA Area:     18.60 cm LA Vol (A2C):   45.1 ml 24.27 ml/m RA Volume:   43.90 ml  23.63 ml/m LA Vol (A4C):   42.7 ml 22.98 ml/m LA Biplane Vol: 46.7 ml 25.13 ml/m  AORTIC VALVE LVOT Vmax:   94.20 cm/s LVOT Vmean:  70.600 cm/s LVOT VTI:    0.201 m  AORTA Ao Root diam: 3.30 cm Ao Asc diam:  3.20 cm MITRAL VALVE                TRICUSPID VALVE MV Area (PHT): 6.17 cm     TR Peak grad:   43.3 mmHg MV Decel  Time: 123 msec     TR Vmax:        329.00 cm/s MV E velocity: 115.00 cm/s MV A velocity: 31.20 cm/s   SHUNTS MV E/A ratio:  3.69         Systemic VTI:  0.20 m                             Systemic Diam: 2.00 cm Mertie Moores MD Electronically signed by Mertie Moores MD Signature Date/Time: 06/28/2020/10:18:42 AM    Final    CUP PACEART REMOTE DEVICE CHECK  Result Date: 06/14/2020 Scheduled monthly remote reviewed. Normal device function.  <3 months  to ERI AF burden 5%, on [place]. Routing to Triage for ongoing AF Next remote 30 days. JM  Disposition   Patient is being discharged home today in good condition.  Follow-up Plans & Appointments     Follow-up Information    Croitoru, Dani Gobble, MD. Go on 07/13/2020.   Specialty: Cardiology Why: _0 :20pm for hospital follow up and wound check  Contact information: [address] [place] [address] [phone]              Discharge Instructions    Diet - low sodium heart healthy   Complete by: As directed    Discharge wound care:   Complete by: As directed    Follow pacemaker wound care instructions on discharge paperwork.   Increase activity slowly   Complete by: As directed       Discharge Medications   Allergies as of 07/02/2020      Reactions   Iodinated Diagnostic Agents Other (See Comments)   Pt was not told what the reaction was   Cymbalta [duloxetine Hcl] Other (See Comments)   Could not think straight   Other Itching   Dial Soap, (burns)      Medication List    STOP taking these medications   atenolol 50 MG tablet Commonly known as: TENORMIN   Benicar HCT 20-12.5 MG tablet Generic drug: olmesartan-hydrochlorothiazide   esomeprazole 20 MG capsule Commonly known as: NEXIUM Replaced by: pantoprazole 40 MG tablet   furosemide 20 MG tablet Commonly known as: LASIX   omeprazole 40 MG capsule Commonly known as: PRILOSEC     TAKE these medications   amiodarone 200 MG tablet Commonly known as: Pacerone Take 0.5 tablets (100 mg total) by mouth daily.   Artificial Tears 1.4 % ophthalmic solution Generic drug: polyvinyl alcohol Place 1 drop into both eyes 3 (three) times daily as needed for dry eyes.   cholecalciferol 1000 units tablet Commonly known as: VITAMIN D Take 1,000 Units by mouth daily.   Eliquis 2.5 MG Tabs tablet Generic drug: apixaban TAKE 1 TABLET TWICE A DAY What changed: how much to take   gabapentin 300 MG  capsule Commonly known as: NEURONTIN TAKE 1 CAPSULE TWICE A DAY   mirabegron ER 25 MG Tb24 tablet Commonly known as: MYRBETRIQ Take 1 tablet (25 mg total) by mouth daily. Start taking on: July 03, 2020 What changed:   medication strength  how much to take   pantoprazole 40 MG tablet Commonly known as: PROTONIX Take 1 tablet (40 mg total) by mouth daily. Start taking on: July 03, 2020 Replaces: esomeprazole 20 MG capsule   pravastatin 40 MG tablet Commonly known as: PRAVACHOL TAKE 1 TABLET DAILY   Synthroid 137 MCG tablet Generic drug: levothyroxine TAKE 1 TABLET DAILY What changed:  how much to take  when to take this            Discharge Care Instructions  (From admission, onward)         Start     Ordered   07/02/20 0000  Discharge wound care:       Comments: Follow pacemaker wound care instructions on discharge paperwork.   07/02/20 0951             Outstanding Labs/Studies   Repeat BMET in 3 days at [facility]. Repeat CBC at follow-up visit.  Duration of Discharge Encounter   Greater than 30 minutes including physician time.  Discharge delayed with no availability at SNF.  SNF placement obtained today, 07/03/20. No changes in management.   Signed, Adayah Arocho, PA-C  07/03/20 at 1:41 PM

## 2020-07-02 NOTE — Progress Notes (Signed)
Progress Note  Patient Name: Jill Washington Date of Encounter: 07/02/2020  [facility] Cardiologist: Sanda Klein, MD   Subjective   Needed some help ordering her breakfast. Overall she is feeling well. Some leg pain but this is not new. Neuropathy.  Inpatient Medications    Scheduled Meds: . amiodarone  100 mg Oral Daily  . apixaban  2.5 mg Oral BID  . atenolol  75 mg Oral Daily  . cholecalciferol  1,000 Units Oral Daily  . feeding supplement (ENSURE ENLIVE)  237 mL Oral BID BM  . gabapentin  300 mg Oral BID  . levothyroxine  137 mcg Oral QAC breakfast  . mirabegron ER  50 mg Oral Daily  . multivitamin with minerals  1 tablet Oral Daily  . pantoprazole  40 mg Oral Daily  . pneumococcal 23 valent vaccine  0.5 mL Intramuscular Tomorrow-1000  . pravastatin  40 mg Oral Daily  . sodium chloride flush  3 mL Intravenous Q12H   Continuous Infusions: . sodium chloride     PRN Meds: sodium chloride, acetaminophen, ondansetron (ZOFRAN) IV, polyvinyl alcohol, sodium chloride flush   Vital Signs    Vitals:   07/01/20 2111 07/02/20 0025 07/02/20 0444 07/02/20 0737  BP:  (!) 110/49 (!) 109/51 105/68  Pulse:  70 70 70  Resp:  20 (!) 21 20  Temp:  98.2 F (36.8 C) 97.9 F (36.6 C) 98 F (36.7 C)  TempSrc:  Oral Oral Oral  SpO2: 96% 93% 94% 92%  Weight:   83.5 kg   Height:        Intake/Output Summary (Last 24 hours) at 07/02/2020 0821 Last data filed at 07/02/2020 0444 Gross per 24 hour  Intake --  Output 350 ml  Net -350 ml   Last 3 Weights 07/02/2020 07/01/2020 06/30/2020  Weight (lbs) 184 lb 1.4 oz 178 lb 9.2 oz 175 lb 0.7 oz  Weight (kg) 83.5 kg 81 kg 79.4 kg      Telemetry    Pacing noted- Personally Reviewed  ECG    No new- Personally Reviewed  Physical Exam  Elderly GEN: No acute distress.   Neck: No JVD Cardiac: RRR, no murmurs, rubs, or gallops. Pacemaker site normal. Respiratory: Clear to auscultation bilaterally. GI: Soft, nontender,  non-distended  MS: No edema; No deformity. Neuro:  Nonfocal  Psych: Normal affect   Labs    High Sensitivity Troponin:   Recent Labs  Lab 06/27/20 1043 06/27/20 1822  TROPONINIHS 33* 39*      Chemistry Recent Labs  Lab 06/29/20 0500 06/30/20 0513 07/01/20 0517  NA 140 135 139  K 4.1 4.0 4.0  CL 100 99 100  CO2 33* 29 29  GLUCOSE 93 99 101*  BUN 34* 44* 57*  CREATININE 2.05* 2.48* 3.01*  CALCIUM 9.6 8.7* 8.7*  GFRNONAA 21* 16* 13*  GFRAA 24* 19* 15*  ANIONGAP 7 7 10      Hematology Recent Labs  Lab 06/27/20 1043 07/01/20 0517  WBC 11.0* 8.0  RBC 3.81* 3.13*  HGB 11.3* 9.2*  HCT 36.4 29.5*  MCV 95.5 94.2  MCH 29.7 29.4  MCHC 31.0 31.2  RDW 12.4 12.2  PLT 205 156    BNP Recent Labs  Lab 06/27/20 1824  BNP 1,310.8*     DDimer No results for input(s): DDIMER in the last 168 hours.   Radiology    No results found.   Patient Profile     86 y.o. female here with pacemaker generator  change out by Dr. Lorenza Cambridge, atrial fibrillation with RVR, diastolic heart failure post cardioversion  Assessment & Plan    Paroxysmal atrial fibrillation -Doing well post cardioversion -Continue with low-dose amiodarone 100 mg a day. -Was on atenolol 75 mg daily. This was held yesterday because of hypotension. I will go ahead and stop. -Normal ejection fraction on echocardiogram.  Chronic anticoagulation -Eliquis 2.5 mg twice a day.  Pacemaker -Generator change out Dr. Lorenza Cambridge. Normal.  Acute kidney injury -Creatinine yesterday was 3.01 up from 2.48. -1.8 at baseline, chronic kidney disease stage IV. Yesterday gave her back 500 mL of fluid. Holding ARB, HCTZ  Essential hypertension -Was 91/74 yesterday, gave back some fluid. -This morning 105/68 emetic  Deconditioning, weakness -Awaiting skilled nursing facility. Was evaluated by PT. Had some leg pain 2 days ago.  Okay for discharge to SNF today.  For questions or updates, please contact [place] Please consult [email or web address] for contact info under        Signed, Candee Furbish, MD  07/02/2020, 8:21 AM

## 2020-07-02 NOTE — TOC Progression Note (Signed)
Transition of Care [facility]) - Progression Note    Patient Details  Name: Jill Washington MRN: [id number] Date of Birth: 26-Sep-1926  Transition of Care [facility]) CM/SW [place],  Phone Number: [phone] 07/02/2020, 10:38 AM  Clinical Narrative:     CSW called [facility] back in order to see if they can accept patient.  TOC team will continue to assist with discharge planning needs.  Expected Discharge Plan: [place] Barriers to Discharge: Continued Medical Work up  Expected Discharge Plan and Services Expected Discharge Plan: [place] arrangements for the past 2 months: Single Family Home Expected Discharge Date: 07/02/20                                     Social Determinants of Health (SDOH) Interventions    Readmission Risk Interventions No flowsheet data found.

## 2020-07-02 NOTE — Progress Notes (Signed)
Report given to [place] crew). Patient is alert and oriented x4 resting in bed with call light in reach. VSS. No events overnight. Plan is for patient to be DC'd to [facility] today. No labs overnight. AV / V paced on tele. Right chest pacemaker generator change dressing clean, dry and intact.

## 2020-07-03 DIAGNOSIS — Z743 Need for continuous supervision: Secondary | ICD-10-CM | POA: Diagnosis not present

## 2020-07-03 DIAGNOSIS — I10 Essential (primary) hypertension: Secondary | ICD-10-CM | POA: Diagnosis not present

## 2020-07-03 DIAGNOSIS — I495 Sick sinus syndrome: Secondary | ICD-10-CM | POA: Diagnosis not present

## 2020-07-03 DIAGNOSIS — I4891 Unspecified atrial fibrillation: Secondary | ICD-10-CM | POA: Diagnosis not present

## 2020-07-03 DIAGNOSIS — Z79899 Other long term (current) drug therapy: Secondary | ICD-10-CM | POA: Diagnosis not present

## 2020-07-03 DIAGNOSIS — E7800 Pure hypercholesterolemia, unspecified: Secondary | ICD-10-CM | POA: Diagnosis not present

## 2020-07-03 DIAGNOSIS — D649 Anemia, unspecified: Secondary | ICD-10-CM | POA: Diagnosis not present

## 2020-07-03 DIAGNOSIS — H04123 Dry eye syndrome of bilateral lacrimal glands: Secondary | ICD-10-CM | POA: Diagnosis not present

## 2020-07-03 DIAGNOSIS — M79604 Pain in right leg: Secondary | ICD-10-CM | POA: Diagnosis not present

## 2020-07-03 DIAGNOSIS — Z950 Presence of cardiac pacemaker: Secondary | ICD-10-CM | POA: Diagnosis not present

## 2020-07-03 DIAGNOSIS — M6281 Muscle weakness (generalized): Secondary | ICD-10-CM | POA: Diagnosis not present

## 2020-07-03 DIAGNOSIS — R41841 Cognitive communication deficit: Secondary | ICD-10-CM | POA: Diagnosis not present

## 2020-07-03 DIAGNOSIS — N179 Acute kidney failure, unspecified: Secondary | ICD-10-CM | POA: Diagnosis not present

## 2020-07-03 DIAGNOSIS — Z5181 Encounter for therapeutic drug level monitoring: Secondary | ICD-10-CM | POA: Diagnosis not present

## 2020-07-03 DIAGNOSIS — M170 Bilateral primary osteoarthritis of knee: Secondary | ICD-10-CM | POA: Diagnosis not present

## 2020-07-03 DIAGNOSIS — K219 Gastro-esophageal reflux disease without esophagitis: Secondary | ICD-10-CM | POA: Diagnosis not present

## 2020-07-03 DIAGNOSIS — I442 Atrioventricular block, complete: Secondary | ICD-10-CM | POA: Diagnosis not present

## 2020-07-03 DIAGNOSIS — R2681 Unsteadiness on feet: Secondary | ICD-10-CM | POA: Diagnosis not present

## 2020-07-03 DIAGNOSIS — N184 Chronic kidney disease, stage 4 (severe): Secondary | ICD-10-CM | POA: Diagnosis not present

## 2020-07-03 DIAGNOSIS — R531 Weakness: Secondary | ICD-10-CM | POA: Diagnosis not present

## 2020-07-03 DIAGNOSIS — E785 Hyperlipidemia, unspecified: Secondary | ICD-10-CM | POA: Diagnosis not present

## 2020-07-03 DIAGNOSIS — N3289 Other specified disorders of bladder: Secondary | ICD-10-CM | POA: Diagnosis not present

## 2020-07-03 DIAGNOSIS — R6889 Other general symptoms and signs: Secondary | ICD-10-CM | POA: Diagnosis not present

## 2020-07-03 DIAGNOSIS — G609 Hereditary and idiopathic neuropathy, unspecified: Secondary | ICD-10-CM | POA: Diagnosis not present

## 2020-07-03 DIAGNOSIS — Z7401 Bed confinement status: Secondary | ICD-10-CM | POA: Diagnosis not present

## 2020-07-03 DIAGNOSIS — H9190 Unspecified hearing loss, unspecified ear: Secondary | ICD-10-CM | POA: Diagnosis not present

## 2020-07-03 DIAGNOSIS — I4819 Other persistent atrial fibrillation: Secondary | ICD-10-CM | POA: Diagnosis not present

## 2020-07-03 DIAGNOSIS — I480 Paroxysmal atrial fibrillation: Secondary | ICD-10-CM | POA: Diagnosis not present

## 2020-07-03 DIAGNOSIS — Z23 Encounter for immunization: Secondary | ICD-10-CM | POA: Diagnosis not present

## 2020-07-03 DIAGNOSIS — E039 Hypothyroidism, unspecified: Secondary | ICD-10-CM | POA: Diagnosis not present

## 2020-07-03 DIAGNOSIS — M792 Neuralgia and neuritis, unspecified: Secondary | ICD-10-CM | POA: Diagnosis not present

## 2020-07-03 DIAGNOSIS — M79605 Pain in left leg: Secondary | ICD-10-CM | POA: Diagnosis not present

## 2020-07-03 DIAGNOSIS — M2550 Pain in unspecified joint: Secondary | ICD-10-CM | POA: Diagnosis not present

## 2020-07-03 DIAGNOSIS — R278 Other lack of coordination: Secondary | ICD-10-CM | POA: Diagnosis not present

## 2020-07-03 DIAGNOSIS — I5033 Acute on chronic diastolic (congestive) heart failure: Secondary | ICD-10-CM | POA: Diagnosis not present

## 2020-07-03 DIAGNOSIS — Z7901 Long term (current) use of anticoagulants: Secondary | ICD-10-CM | POA: Diagnosis not present

## 2020-07-03 NOTE — Progress Notes (Signed)
Called [facility] @ [phone] and report given to [facility], LPN. Questions answered and no concerns expressed or verbalized. Patient transferred via stretcher by PTAR; Alert and oriented, at baseline. Leveda Anna, BSN, RN

## 2020-07-03 NOTE — Progress Notes (Signed)
Occupational Therapy Evaluation Patient Details Name: Jill Washington MRN: [id number] DOB: May 19, 1926 Today's Date: 07/03/2020    History of Present Illness 86 y.o. female admitted on 06/27/20 for SOB, LE edema, cough.  Pt's pacemaker was interrogated and pt underwent battery change out and cardioversion on 06/29/20.  Pt dx with acute on chronic CHF, PAF.  Pt with significant PMH of peripheral neuropathy, pacemaker, MVP, HTN, fibromyalgia, complete heart block, and cervical spine surgery.   Clinical Impression   Pt presents with above diagnosis. PTA pt PLOF living at home alone mostly mod I with use of AE as needed for safety. Pt currently limited with safe ADL engagement due to pain, sensation, and weakness limiting ability to perform toilet/tub transfer and clothing management in standing with AE. Pt will benefit from additional acute OT to address established deficits and to maximize independence with education in AE, UE strength, and safety awareness prior to dc to SNF.     Follow Up Recommendations  SNF    Equipment Recommendations       Recommendations for Other Services       Precautions / Restrictions Precautions Precautions: Fall Precaution Comments: h/o falls, but none recently per pt report (months since her last fall) Restrictions Weight Bearing Restrictions: No      Mobility Bed Mobility Overal bed mobility: Needs Assistance Bed Mobility: Supine to Sit     Supine to sit: Min assist     General bed mobility comments: assist with management of RLE to EOB due to pain and sensation. Cueing for use of bed rail to faciliitate elevation of trunk. Some LOB observed in sitting  Transfers Overall transfer level: Needs assistance Equipment used: Rolling walker (2 wheeled) Transfers: Lateral/Scoot Transfers          Lateral/Scoot Transfers: Mod assist General transfer comment: lateral scoot toward HOB to improve safe transition from sit to supine. Mod A with use fo  scoot paddue to pain and UE weakness.     Balance Overall balance assessment: Needs assistance Sitting-balance support: Feet supported;Bilateral upper extremity supported Sitting balance-Leahy Scale: Poor Sitting balance - Comments: unable to remove UE support in sitting EOB.    Standing balance support: Bilateral upper extremity supported Standing balance-Leahy Scale: Poor Standing balance comment: needs external support of walker and therapist to get to 1/2 standing.                            ADL either performed or assessed with clinical judgement   ADL Overall ADL's : Needs assistance/impaired Eating/Feeding: Set up;Sitting;Bed level   Grooming: Wash/dry hands;Wash/dry face;Oral care;Brushing hair;Set up;Sitting           Upper Body Dressing : Modified independent;Sitting   Lower Body Dressing: Maximal assistance;Sitting/lateral leans                 General ADL Comments: Pt received to EOB to perform ADL task and UE exercises.      Vision         Perception     Praxis      Pertinent Vitals/Pain Pain Assessment: Faces Faces Pain Scale: Hurts even more Pain Location: right leg, feet to touch Pain Descriptors / Indicators: Burning Pain Intervention(s): Limited activity within patient's tolerance;Monitored during session;Repositioned     Hand Dominance Right   Extremity/Trunk Assessment Upper Extremity Assessment Upper Extremity Assessment: Generalized weakness   Lower Extremity Assessment Lower Extremity Assessment: Defer to PT evaluation RLE Deficits /  Details: right leg is weaker than L leg with difficult raising hip against gravity to get it out of the bed. 3/5 RLE Sensation: history of peripheral neuropathy LLE Deficits / Details: left leg generally weak, but stronger than R leg with at least 3/5 strength per gross supine and seated functional assessment.  LLE Sensation: history of peripheral neuropathy   Cervical / Trunk  Assessment Cervical / Trunk Assessment: Other exceptions;Kyphotic   Communication Communication Communication: HOH   Cognition Arousal/Alertness: Awake/alert Behavior During Therapy: WFL for tasks assessed/performed Overall Cognitive Status: No family/caregiver present to determine baseline cognitive functioning                                 General Comments: Mildly impaired, however, may be due to poor hearing.    General Comments  pt performed ther ex of UE exercises, some fatigue noted. BP 102/87 and 142/85    Exercises Exercises: General Upper Extremity General Exercises - Upper Extremity Shoulder Flexion: AROM;20 reps;Seated;Both Shoulder Extension: AROM;Both;20 reps;Seated Elbow Flexion: AROM;20 reps;Both;Seated Elbow Extension: AROM;Both;20 reps;Seated   Shoulder Instructions      Home Living Family/patient expects to be discharged to:: Private residence Living Arrangements: Alone Available Help at Discharge: Family;Available PRN/intermittently Type of Home: House Home Access: Level entry     Home Layout: One level     Bathroom Shower/Tub: Teacher, early years/pre: Standard     Home Equipment: Environmental consultant - 2 wheels;Shower seat          Prior Functioning/Environment Level of Independence: Independent with assistive device(s)        Comments: uses RW for gait, cooks microwave meals and does her own bathing seated in the shower.  Daughter drives to get her groceries and puts them away.          OT Problem List:        OT Treatment/Interventions: Self-care/ADL training;Therapeutic exercise;DME and/or AE instruction;Therapeutic activities;Cognitive remediation/compensation;Patient/family education;Balance training    OT Goals(Current goals can be found in the care plan section) Acute Rehab OT Goals Patient Stated Goal: to go to rehab and get stronger OT Goal Formulation: With patient Time For Goal Achievement: 07/17/20 Potential  to Achieve Goals: Fair  OT Frequency: Min 2X/week   Barriers to D/C:            Co-evaluation              AM-PAC OT "6 Clicks" Daily Activity     Outcome Measure Help from another person eating meals?: None Help from another person taking care of personal grooming?: A Little Help from another person toileting, which includes using toliet, bedpan, or urinal?: A Lot Help from another person bathing (including washing, rinsing, drying)?: A Little Help from another person to put on and taking off regular upper body clothing?: A Little Help from another person to put on and taking off regular lower body clothing?: A Little 6 Click Score: 18   End of Session Nurse Communication: Mobility status  Activity Tolerance: Patient limited by pain Patient left: in bed;with call bell/phone within reach;with bed alarm set  OT Visit Diagnosis: Unsteadiness on feet (R26.81);Muscle weakness (generalized) (M62.81);History of falling (Z91.81);Pain Pain - Right/Left: Right Pain - part of body: Leg                Time: 7322-0254 OT Time Calculation (min): 28 min Charges:  OT General Charges $OT Visit: 1 Visit OT  Evaluation $OT Eval Moderate Complexity: 1 Mod OT Treatments $Therapeutic Exercise: 8-22 mins  Minus Breeding, MSOT, OTR/L  Supplemental Rehabilitation Services  [phone]   Marius Ditch 07/03/2020, 9:47 AM

## 2020-07-03 NOTE — Plan of Care (Signed)
  Problem: Education: Goal: Ability to demonstrate management of disease process will improve Outcome: Adequate for Discharge Goal: Ability to verbalize understanding of medication therapies will improve Outcome: Adequate for Discharge   Problem: Activity: Goal: Capacity to carry out activities will improve Outcome: Adequate for Discharge   Problem: Education: Goal: Knowledge of General Education information will improve Description: Including pain rating scale, medication(s)/side effects and non-pharmacologic comfort measures Outcome: Adequate for Discharge   Problem: Health Behavior/Discharge Planning: Goal: Ability to manage health-related needs will improve Outcome: Adequate for Discharge   Problem: Activity: Goal: Risk for activity intolerance will decrease Outcome: Adequate for Discharge   Problem: Education: Goal: Individualized Educational Video(s) Outcome: Completed/Met   Problem: Cardiac: Goal: Ability to achieve and maintain adequate cardiopulmonary perfusion will improve Outcome: Completed/Met   Problem: Clinical Measurements: Goal: Ability to maintain clinical measurements within normal limits will improve Outcome: Completed/Met Goal: Will remain free from infection Outcome: Completed/Met Goal: Diagnostic test results will improve Outcome: Completed/Met Goal: Respiratory complications will improve Outcome: Completed/Met Goal: Cardiovascular complication will be avoided Outcome: Completed/Met   Problem: Nutrition: Goal: Adequate nutrition will be maintained Outcome: Completed/Met   Problem: Coping: Goal: Level of anxiety will decrease Outcome: Completed/Met   Problem: Elimination: Goal: Will not experience complications related to bowel motility Outcome: Completed/Met Goal: Will not experience complications related to urinary retention Outcome: Completed/Met   Problem: Pain Managment: Goal: General experience of comfort will improve Outcome:  Completed/Met   Problem: Safety: Goal: Ability to remain free from injury will improve Outcome: Completed/Met   Problem: Skin Integrity: Goal: Risk for impaired skin integrity will decrease Outcome: Completed/Met

## 2020-07-03 NOTE — TOC Transition Note (Signed)
Transition of Care [facility]) - CM/SW Discharge Note   Patient Details  Name: TAMASHA LAPLANTE MRN: [id number] Date of Birth: January 23, 1926  Transition of Care [facility]) CM/SW Contact:  Trula Ore, [place] Phone Number: 07/03/2020, 10:11 AM   Clinical Narrative:     Patient will DC to: [facility]  Anticipated DC date: 07/03/2020  Family notified: [name]  Transport by: Corey Harold  ?  Per MD patient ready for DC to [place] . RN, patient, patient's family, and facility notified of DC. Discharge Summary sent to facility. RN given number for report tele#[phone] RM#[phone]. DC packet on chart. Ambulance transport requested for patient.  CSW signing off.  Final next level of care: Skilled Nursing Facility Barriers to Discharge: No Barriers Identified   Patient Goals and CMS Choice Patient states their goals for this hospitalization and ongoing recovery are:: to go to SNF CMS Medicare.gov Compare Post Acute Care list provided to:: Patient Choice offered to / list presented to : Patient  Discharge Placement              Patient chooses bed at: [facility] [place] Patient to be transferred to facility by: [place] Name of family member notified: [name] Patient and family notified of of transfer: 07/03/20  Discharge Plan and Services                                     Social Determinants of Health (SDOH) Interventions     Readmission Risk Interventions No flowsheet data found.

## 2020-07-03 NOTE — TOC Progression Note (Signed)
Transition of Care [facility]) - Progression Note    Patient Details  Name: Jill Washington MRN: [id number] Date of Birth: September 01, 1926  Transition of Care [facility]) CM/SW [place], [place] Phone Number: 07/03/2020, 9:58 AM  Clinical Narrative:     CSW spoke with [facility] who confirmed they can accept patient for SNF placement. CSW called patients daughter Jill Washington to give bed offer. Patients daughter Jill Washington accepted.  Patient has SN[facility] bed [facility].  CSW will continue to follow.   Expected Discharge Plan: [place] Barriers to Discharge: Continued Medical Work up  Expected Discharge Plan and Services Expected Discharge Plan: [place] arrangements for the past 2 months: Single Family Home Expected Discharge Date: 07/02/20                                     Social Determinants of Health (SDOH) Interventions    Readmission Risk Interventions No flowsheet data found.

## 2020-07-07 DIAGNOSIS — I5033 Acute on chronic diastolic (congestive) heart failure: Secondary | ICD-10-CM | POA: Diagnosis not present

## 2020-07-07 DIAGNOSIS — I4891 Unspecified atrial fibrillation: Secondary | ICD-10-CM | POA: Diagnosis not present

## 2020-07-07 DIAGNOSIS — R2681 Unsteadiness on feet: Secondary | ICD-10-CM | POA: Diagnosis not present

## 2020-07-07 DIAGNOSIS — Z950 Presence of cardiac pacemaker: Secondary | ICD-10-CM | POA: Diagnosis not present

## 2020-07-07 DIAGNOSIS — N184 Chronic kidney disease, stage 4 (severe): Secondary | ICD-10-CM | POA: Diagnosis not present

## 2020-07-07 DIAGNOSIS — E039 Hypothyroidism, unspecified: Secondary | ICD-10-CM | POA: Diagnosis not present

## 2020-07-12 ENCOUNTER — Other Ambulatory Visit: Payer: Self-pay | Admitting: *Deleted

## 2020-07-12 NOTE — Patient Outreach (Signed)
Member screened for potential [facility] Care Management needs as a benefit of [place] Medicare.  Per Patient Jill Washington member resides in [place] SNF.   Communication sent to [facility] SW to collaborate about anticipated dc transition and potential [facility] Care Management needs.  Will continue to follow while member resides in SNF.   Marthenia Rolling, MSN-Ed, RN,BSN [place] Acute Care Coordinator [phone] [facility]) [phone]  (Toll free office)

## 2020-07-13 ENCOUNTER — Encounter: Payer: Self-pay | Admitting: Cardiovascular Disease

## 2020-07-13 ENCOUNTER — Ambulatory Visit (INDEPENDENT_AMBULATORY_CARE_PROVIDER_SITE_OTHER): Payer: Medicare Other | Admitting: Cardiovascular Disease

## 2020-07-13 ENCOUNTER — Other Ambulatory Visit: Payer: Self-pay

## 2020-07-13 ENCOUNTER — Other Ambulatory Visit: Payer: Self-pay | Admitting: *Deleted

## 2020-07-13 VITALS — BP 107/46 | HR 70 | Ht 66.0 in | Wt 180.2 lb

## 2020-07-13 DIAGNOSIS — I5033 Acute on chronic diastolic (congestive) heart failure: Secondary | ICD-10-CM | POA: Diagnosis not present

## 2020-07-13 DIAGNOSIS — I10 Essential (primary) hypertension: Secondary | ICD-10-CM

## 2020-07-13 DIAGNOSIS — Z79899 Other long term (current) drug therapy: Secondary | ICD-10-CM | POA: Diagnosis not present

## 2020-07-13 DIAGNOSIS — Z950 Presence of cardiac pacemaker: Secondary | ICD-10-CM

## 2020-07-13 DIAGNOSIS — Z7901 Long term (current) use of anticoagulants: Secondary | ICD-10-CM

## 2020-07-13 DIAGNOSIS — I4819 Other persistent atrial fibrillation: Secondary | ICD-10-CM

## 2020-07-13 DIAGNOSIS — I495 Sick sinus syndrome: Secondary | ICD-10-CM

## 2020-07-13 DIAGNOSIS — I442 Atrioventricular block, complete: Secondary | ICD-10-CM

## 2020-07-13 DIAGNOSIS — E7800 Pure hypercholesterolemia, unspecified: Secondary | ICD-10-CM

## 2020-07-13 DIAGNOSIS — Z5181 Encounter for therapeutic drug level monitoring: Secondary | ICD-10-CM

## 2020-07-13 MED ORDER — AMIODARONE HCL 200 MG PO TABS: 200.0000 mg | ORAL_TABLET | Freq: Every day | ORAL | 3 refills | Status: AC

## 2020-07-13 MED ORDER — POTASSIUM CHLORIDE ER 20 MEQ PO TBCR: 20.0000 meq | EXTENDED_RELEASE_TABLET | Freq: Every day | ORAL | 3 refills | Status: DC

## 2020-07-13 MED ORDER — FUROSEMIDE 40 MG PO TABS: 40.0000 mg | ORAL_TABLET | Freq: Every day | ORAL | 3 refills | Status: AC

## 2020-07-13 NOTE — Patient Outreach (Signed)
[place] Coordinator follow up. Member screened for potential [facility] Care Management needs as a benefit of [place] Medicare.  Update received from [facility] SW indicating family is considering ALF placement post SNF.  Will continue to follow while member resides in SNF.   Marthenia Rolling, MSN-Ed, RN,BSN [place] Acute Care Coordinator [phone] [facility] [phone]  (Toll free office)

## 2020-07-13 NOTE — Progress Notes (Signed)
Patient ID: Jill Washington, female   DOB: 04-07-1926, 86 y.o.   MRN: [id number]    Cardiology Office Note    Date:  07/13/2020   ID:  Jill Washington, DOB January 12, 1926, MRN [id number]  PCP:  Mosie Lukes, MD  Cardiologist:   Sanda Klein, MD   Chief Complaint  Patient presents with  . Atrial Fibrillation  . Pacemaker Check    History of Present Illness:  Jill Washington is a 86 y.o. female with Complete heart block, sinus node dysfunction, atrial fibrillation.  She was recently hospitalized for acute on chronic diastolic heart failure due to persistent atrial fibrillation.  She had cardioversion on September 16.  During the same hospitalization she underwent pacemaker generator change out since her device had reached elective replacement indicator.  Since hospital discharge she has been at [facility].  She has had gradual increase in weight, now about 6 pounds weight gain from hospital discharge and roughly 10 pounds above her weight in August.  She has lower extremity edema, but she does not have any orthopnea or PND.  She is quite sedentary so is hard to say whether she would have exertional dyspnea.  She is not aware of palpitations.  She has not had dizziness or syncope.  Interrogation of her pacemaker shows that she only maintained normal rhythm for a few hours following her cardioversion and she has been back in atrial fibrillation since September 16.  Prior to the recent acute illness she used to have just paroxysmal atrial fibrillation with a relatively low burden of about 2%.  She did have 1 other episode of persistent atrial fibrillation lasting for 3 weeks in May of this year.  When not in atrial fibrillation she has virtually 100% atrial pacing.  She is pacemaker dependent and has 100% ventricular pacing.  Other than the presence of atrial fibrillation, pacemaker function is normal.  She has an estimated generator longevity of 9-10 years ([place],  implanted 06/29/2020).  There have been no episodes of high ventricular rates.  Lead parameters are normal (atrial threshold testing could not be performed).  She is on chronic amiodarone therapy but was only taking 100 mg once daily.  She is now on anticoagulation with Eliquis and has not had any bleeding problems, falls or injuries.  The dose of Eliquis is adjusted for her advanced age and abnormal renal function.  She had normal liver function tests and TSH during her hospitalization earlier this month.  Past Medical History:  Diagnosis Date  . Arthritis   . Blood transfusion   . CHB (complete heart block) ([place]) 11/10/2014  . Chicken pox as a child  . Colon polyps    hyperplastic  . Critical illness polyneuropathy ([place])   . Diarrhea 04/25/2017  . Diverticulosis of sigmoid colon 02/05/2007   mild  . Fecal incontinence   . Fibromyalgia   . GERD (gastroesophageal reflux disease)   . H/O measles   . H/O mumps   . Hiatal hernia   . History of atrial fibrillation   . History of chicken pox   . History of empyema of pleura   . History of gallstones   . History of skin cancer    face  . Hypercholesteremia   . Hypertension   . Hypothyroidism   . IBS (irritable bowel syndrome)   . Measles as a child  . Medicare annual wellness visit, subsequent 05/15/2015   Allergies verified: UTD   Immunization  Status: Flu vaccine-- NA  Tdap-- 9-10 years ago per patient  PNA-- 09/30/07 (23)  Shingles-- 04/07/14   A/P:  Changes to [place], PSH or Personal Hx: UTD Pap-- none recent  MMG--declines further MMgs (last 08/12/03 BI-RADS 1: Neg)  Bone Density-- 08/11/03 with Prescott Gum, MD- Normal, declines further testing CCS-- 05/07/12 with Silvano Rusk, MD- polyp removed- no   . Mitral valve prolapse   . Mumps as a child  . Overactive bladder 05/07/2011  . Pacemaker   . Peripheral neuropathy   . Shingles 8 yrs ago  . Sick sinus syndrome ([place])   . Urinary incontinence   . Venous insufficiency   . Vitamin D  deficiency     Past Surgical History:  Procedure Laterality Date  . APPENDECTOMY  1971  . CARDIAC CATHETERIZATION  05/10/1997   Normal coronaries, MVP  . CARDIOVERSION N/A 06/29/2020   Procedure: CARDIOVERSION;  Surgeon: Buford Dresser, MD;  Location: [facility] ENDOSCOPY;  Service: Cardiovascular;  Laterality: N/A;  . CERVICAL SPINE SURGERY    . CHOLECYSTECTOMY  03/2005   by Dr. Dalbert Batman  . COLONOSCOPY  02/05/2007,02/05/05   Dr. Silvano Rusk  . ESOPHAGOGASTRODUODENOSCOPY  02/05/2005,01/22/90   Dr. Silvano Rusk, Dr. Rachelle Hora  . NM MYOCAR PERF WALL MOTION  08/24/2010   Normal  . PACEMAKER GENERATOR CHANGE N/A 02/25/2012   Procedure: PACEMAKER GENERATOR CHANGE;  Surgeon: Sanda Klein, MD;  Location: [facility] CATH LAB;  Service: Cardiovascular;  Laterality: N/A;  . pacemaker implanted  05/11/97,08/02/05   [place]  . PPM GENERATOR CHANGEOUT N/A 06/29/2020   Procedure: PPM GENERATOR CHANGEOUT;  Surgeon: Sanda Klein, MD;  Location: [facility] CV LAB;  Service: Cardiovascular;  Laterality: N/A;  . TONSILLECTOMY    . TOTAL ABDOMINAL HYSTERECTOMY W/ BILATERAL SALPINGOOPHORECTOMY      Current Medications: Outpatient Medications Prior to Visit  Medication Sig Dispense Refill  . cholecalciferol (VITAMIN D) 1000 UNITS tablet Take 1,000 Units by mouth daily.    Marland Kitchen ELIQUIS 2.5 MG TABS tablet TAKE 1 TABLET TWICE A DAY (Patient taking differently: Take 2.5 mg by mouth 2 (two) times daily. ) 180 tablet 1  . gabapentin (NEURONTIN) 300 MG capsule TAKE 1 CAPSULE TWICE A DAY (Patient taking differently: Take 300 mg by mouth 2 (two) times daily. ) 180 capsule 3  . mirabegron ER (MYRBETRIQ) 25 MG TB24 tablet Take 1 tablet (25 mg total) by mouth daily.    . pantoprazole (PROTONIX) 40 MG tablet Take 1 tablet (40 mg total) by mouth daily.    . polyvinyl alcohol (ARTIFICIAL TEARS) 1.4 % ophthalmic solution Place 1 drop into both eyes 3 (three) times daily as needed for dry eyes.    . pravastatin (PRAVACHOL) 40  MG tablet TAKE 1 TABLET DAILY (Patient taking differently: Take 40 mg by mouth daily. ) 90 tablet 3  . SYNTHROID 137 MCG tablet TAKE 1 TABLET DAILY (Patient taking differently: Take 137 mcg by mouth daily before breakfast. ) 90 tablet 3  . PACERONE 200 MG tablet TAKE ONE-HALF (1/2) TABLET DAILY 45 tablet 3   No facility-administered medications prior to visit.     Allergies:   Iodinated diagnostic agents, Cymbalta [duloxetine hcl], and Other   Social History   Socioeconomic History  . Marital status: Widowed    Spouse name: Not on file  . Number of children: 3  . Years of education: Not on file  . Highest education level: Not on file  Occupational History  . Occupation: retired    Fish farm manager:  RETIRED  Tobacco Use  . Smoking status: Former Smoker    Packs/day: 2.00    Years: 6.00    Pack years: 12.00    Types: Cigarettes    Quit date: 10/15/1951    Years since quitting: 68.7  . Smokeless tobacco: Never Used  Vaping Use  . Vaping Use: Never used  Substance and Sexual Activity  . Alcohol use: No  . Drug use: No  . Sexual activity: Not on file  Other Topics Concern  . Not on file  Social History Narrative   She is a widow, one son to daughters. She is retired.   Social Determinants of Health   Financial Resource Strain: Low Risk   . Difficulty of Paying Living Expenses: Not hard at all  Food Insecurity: No Food Insecurity  . Worried About Charity fundraiser in the Last Year: Never true  . Ran Out of Food in the Last Year: Never true  Transportation Needs: No Transportation Needs  . Lack of Transportation (Medical): No  . Lack of Transportation (Non-Medical): No  Physical Activity:   . Days of Exercise per Week: Not on file  . Minutes of Exercise per Session: Not on file  Stress:   . Feeling of Stress : Not on file  Social Connections:   . Frequency of Communication with Friends and Family: Not on file  . Frequency of Social Gatherings with Friends and Family: Not on  file  . Attends Religious Services: Not on file  . Active Member of Clubs or Organizations: Not on file  . Attends Archivist Meetings: Not on file  . Marital Status: Not on file     Family History:  The patient's family history includes Alcohol abuse in her daughter; Cancer (age of onset: 63) in her sister; Colon cancer in an other family member; Diabetes in her brother, brother, daughter, father, sister, sister, and son; Diabetes (age of onset: 37) in her mother; Gallbladder disease in her father; Heart attack in her brother; Hypertension in her daughter and sister; Pancreatic cancer in her mother; Stomach cancer in her maternal aunt.   ROS:   Please see the history of present illness.    ROS All other systems reviewed and are negative.   PHYSICAL EXAM:   VS:  BP (!) 107/46   Pulse 70   Ht 5\' 6"  (1.676 m)   Wt 180 lb 3.2 oz (81.7 kg)   SpO2 97%   BMI 29.09 kg/m  General: Alert, oriented x3, no distress, appears elderly and fairly frail.  The right subclavian pacemaker site is healing well.  The Steri-Strips are still in place.  There is no swelling, bleeding, redness or warmth Head: no evidence of trauma, PERRL, EOMI, no exophtalmos or lid lag, no myxedema, no xanthelasma; normal ears, nose and oropharynx Neck: Hard to see the jugular venous pulsations, but I believe they are elevated at about 8-9 cm; brisk carotid pulses without delay and no carotid bruits Chest: clear to auscultation, no signs of consolidation by percussion or palpation, normal fremitus, symmetrical and full respiratory excursions Cardiovascular: normal position and quality of the apical impulse, regular rhythm, normal first and second heart sounds, no murmurs, rubs or gallops Abdomen: no tenderness or distention, no masses by palpation, no abnormal pulsatility or arterial bruits, normal bowel sounds, no hepatosplenomegaly Extremities: Lower extremities are wrapped in bandages but there is no obvious  clubbing or cyanosis she has 2+ symmetrical calf edema; 2+ radial, ulnar and brachial  pulses bilaterally; 2+ right femoral, posterior tibial and dorsalis pedis pulses; 2+ left femoral, posterior tibial and dorsalis pedis pulses; no subclavian or femoral bruits Neurological: grossly nonfocal except very hard of hearing Psych: Normal mood and affect   Wt Readings from Last 3 Encounters:  07/13/20 180 lb 3.2 oz (81.7 kg)  07/03/20 184 lb 4.9 oz (83.6 kg)  06/05/20 170 lb (77.1 kg)      Studies/Labs Reviewed:   ECHO:   1. Left ventricular ejection fraction, by estimation, is 60 to 65%. The left ventricle has normal function. The left ventricle has no regional wall motion abnormalities. Left ventricular diastolic parameters are indeterminate.  2. Right ventricular systolic function is normal. The right ventricular size is small. There is moderately elevated pulmonary artery systolic pressure.  3. Right atrial size was mildly dilated.  4. The mitral valve is myxomatous. Mild mitral valve regurgitation. There is mild prolapse of anterior leaflet of the mitral valve.  5. The aortic valve is normal in structure. Aortic valve regurgitation is not visualized. No aortic stenosis is present.   EKG:  EKG is ordered today and shows background atrial fibrillation with 100% ventricular paced rhythm  Recent Labs: 02/09/2020: ALT 10 06/27/2020: B Natriuretic Peptide 1,310.8; Magnesium 1.8; TSH 4.508 07/01/2020: Hemoglobin 9.2; Platelets 156 07/02/2020: BUN 62; Creatinine, Ser 2.63; Potassium 4.9; Sodium 137   Lipid Panel    Component Value Date/Time   CHOL 129 05/14/2019 0915   TRIG 100 05/14/2019 0915   HDL 43 05/14/2019 0915   CHOLHDL 3.0 05/14/2019 0915   CHOLHDL 4 09/24/2018 1417   VLDL 46.8 (H) 09/24/2018 1417   LDLCALC 66 05/14/2019 0915   LDLDIRECT 76.0 09/24/2018 1417    ASSESSMENT:    1. Persistent atrial fibrillation ([place])   2. Acute on chronic diastolic heart failure ([place])     3. SSS (sick sinus syndrome) ([place])   4. CHB (complete heart block) ([facility])   5. Long term current use of anticoagulant therapy   6. Pacemaker -  [place] leads implanted in 1998 generator change 2006 and 2013 and 2021   7. Encounter for monitoring amiodarone therapy   8. Essential hypertension   9. Hypercholesterolemia      PLAN:  In order of problems listed above:   1. CHF: Preserved left ventricular systolic function.  High risk of recurrent acute decompensation due to the presence of atrial fibrillation, which led to her most recent hospitalization.  We will start furosemide and potassium supplements.  We will schedule cardioversion as soon as possible, but we should wait until she achieves higher levels of amiodarone to increase the likelihood of maintenance of normal rhythm long-term.  Very important to continue to weigh her on a daily basis.  [facility] asked [facility] to call us if her weight exceeds 185 pounds.  If she is discharged from the hospital continued weight monitoring at home is critical.  Sodium restriction reviewed. 2. Afib: Unfortunately she had early recurrence of atrial fibrillation.  Increase amiodarone to 400 mg daily for 1 week, then reduce to 200 mg daily and schedule for cardioversion in approximately 2 weeks.  Made it clear that it is important that she should not miss any doses of Eliquis between now and the cardioversion. CHADSVasc at least 5 (age 23, gender, CHF, hypertension). This procedure has been fully reviewed with the patient and written informed consent has been obtained. 3. SSS: Very high frequency of atrial pacing.  Heart rate histogram is appropriate for  her sedentary lifestyle. 4. CHB: pacemaker dependent (this was the case even before we started amiodarone). 5. Anticoagulation: Eliquis dose adjusted for advanced age and poor renal function.  No bleeding complications. 6. Pacemaker: Recent generator change out September 16, normal device function.   Surgical site appears to be healing well, Steri-Strips should be left in place to come off spontaneously. 7. Amiodarone: Normal liver and thyroid function tests a few days ago.  Check every 6 months. 8. HTN: Blood pressure is very well controlled despite the fact that she is no longer taking metoprolol. 9. HLP: Satisfactory lipid parameters by last assay a year ago.  I do not think we need to repeat this just yet.; history of normal coronary arteries by angiography in 1998, normal nuclear stress test 2014     Medication Adjustments/Labs and Tests Ordered: Current medicines are reviewed at length with the patient today.  Concerns regarding medicines are outlined above.  Medication changes, Labs and Tests ordered today are listed in the Patient Instructions below. Patient Instructions  Medication Instructions:  AMIODARONE: Take 400 mg (2 tablets) once daily for one week then take 200 mg once daily START Furosemide 40 mg once daily START Potassium 20 mEq once daily  Weigh yourself daily and call the office if the weight is over 185 pounds. [phone])  *If you need a refill on your cardiac medications before your next appointment, please call your pharmacy*   Follow-Up: At [facility], you and your health needs are our priority.  As part of our continuing mission to provide you with exceptional heart care, we have created designated Provider Care Teams.  These Care Teams include your primary Cardiologist (physician) and Advanced Practice Providers (APPs -  Physician Assistants and Nurse Practitioners) who all work together to provide you with the care you need, when you need it.  We recommend signing up for the patient portal called "MyChart".  Sign up information is provided on this After Visit Summary.  MyChart is used to connect with patients for Virtual Visits (Telemedicine).  Patients are able to view lab/test results, encounter notes, upcoming appointments, etc.  Non-urgent messages  can be sent to your provider as well.   To learn more about what you can do with MyChart, go to [email or web address].    Your next appointment:   Follow up with Dr. Sallyanne Kuster on 09/04/20 at 8:40 am  Other Instructions/  You are scheduled for a Cardioversion on 07/28/20 with Dr. Sallyanne Kuster.  Please arrive at the [facility] (Main Entrance A) at [facility]: [address] [place],  [address] at 8:30 am . (1 hour prior to procedure unless lab work is needed)  DIET: Nothing to eat or drink after midnight except a sip of water with medications (see medication instructions below)  Medication Instructions: Hold the Furosemide the morning of the procedure  Continue your anticoagulant: Eliquis. If you miss a dose please call the office You will need to continue your anticoagulant after your procedure until you  are told by your  Provider that it is safe to stop   Labs:  Your provider would like for you to return on 07/25/20 to have the following labs drawn: CBC and BMET. You do not need an appointment for the lab. Once in our office lobby there is a podium where you can sign in and ring the doorbell to alert [place] that you are here. The lab is open from 8:00 am to 4:30 pm; closed for lunch from 12:45pm-1:45pm.  You will need to have the coronavirus test completed prior to your procedure. An appointment has been made at 2:45 pm on 07/25/20. This is a Drive Up Visit at [address], [place], [place]/ [address]. Please tell them that you are there for procedure testing. Stay in your car and someone will be with you shortly. Please make sure to have all other labs completed before this test because you will need to stay quarantined until your procedure.    You must have a responsible person to drive you home and stay in the waiting area during your procedure. Failure to do so could result in cancellation.  Bring your insurance cards.  *Special Note: Every effort is made to have  your procedure done on time. Occasionally there are emergencies that occur at the hospital that may cause delays. Please be patient if a delay does occur.       Signed, Sanda Klein, MD  07/13/2020 7:17 PM    [facility] [place], [place], [place]  [address] Phone: [phone]; Fax: [phone]

## 2020-07-13 NOTE — H&P (View-Only) (Signed)
Patient ID: Jill Washington, female   DOB: [birth year], 86 y.o.   MRN: [id number]    Cardiology Office Note    Date:  07/13/2020   ID:  Jill Washington, DOB 1926/06/28, MRN [id number]  PCP:  Mosie Lukes, MD  Cardiologist:   Sanda Klein, MD   Chief Complaint  Patient presents with  . Atrial Fibrillation  . Pacemaker Check    History of Present Illness:  Jill Washington is a 86 y.o. female with Complete heart block, sinus node dysfunction, atrial fibrillation.  She was recently hospitalized for acute on chronic diastolic heart failure due to persistent atrial fibrillation.  She had cardioversion on September 16.  During the same hospitalization she underwent pacemaker generator change out since her device had reached elective replacement indicator.  Since hospital discharge she has been at [facility].  She has had gradual increase in weight, now about 6 pounds weight gain from hospital discharge and roughly 10 pounds above her weight in August.  She has lower extremity edema, but she does not have any orthopnea or PND.  She is quite sedentary so is hard to say whether she would have exertional dyspnea.  She is not aware of palpitations.  She has not had dizziness or syncope.  Interrogation of her pacemaker shows that she only maintained normal rhythm for a few hours following her cardioversion and she has been back in atrial fibrillation since September 16.  Prior to the recent acute illness she used to have just paroxysmal atrial fibrillation with a relatively low burden of about 2%.  She did have 1 other episode of persistent atrial fibrillation lasting for 3 weeks in May of this year.  When not in atrial fibrillation she has virtually 100% atrial pacing.  She is pacemaker dependent and has 100% ventricular pacing.  Other than the presence of atrial fibrillation, pacemaker function is normal.  She has an estimated generator longevity of 9-10 years ([place],  implanted 06/29/2020).  There have been no episodes of high ventricular rates.  Lead parameters are normal (atrial threshold testing could not be performed).  She is on chronic amiodarone therapy but was only taking 100 mg once daily.  She is now on anticoagulation with Eliquis and has not had any bleeding problems, falls or injuries.  The dose of Eliquis is adjusted for her advanced age and abnormal renal function.  She had normal liver function tests and TSH during her hospitalization earlier this month.  Past Medical History:  Diagnosis Date  . Arthritis   . Blood transfusion   . CHB (complete heart block) ([place]) 11/10/2014  . Chicken pox as a child  . Colon polyps    hyperplastic  . Critical illness polyneuropathy ([place])   . Diarrhea 04/25/2017  . Diverticulosis of sigmoid colon 02/05/2007   mild  . Fecal incontinence   . Fibromyalgia   . GERD (gastroesophageal reflux disease)   . H/O measles   . H/O mumps   . Hiatal hernia   . History of atrial fibrillation   . History of chicken pox   . History of empyema of pleura   . History of gallstones   . History of skin cancer    face  . Hypercholesteremia   . Hypertension   . Hypothyroidism   . IBS (irritable bowel syndrome)   . Measles as a child  . Medicare annual wellness visit, subsequent 05/15/2015   Allergies verified: UTD   Immunization  Status: Flu vaccine-- NA  Tdap-- 9-10 years ago per patient  PNA-- 09/30/07 (23)  Shingles-- 04/07/14   A/P:  Changes to [place], PSH or Personal Hx: UTD Pap-- none recent  MMG--declines further MMgs (last 08/12/03 BI-RADS 1: Neg)  Bone Density-- 08/11/03 with Prescott Gum, MD- Normal, declines further testing CCS-- 05/07/12 with Silvano Rusk, MD- polyp removed- no   . Mitral valve prolapse   . Mumps as a child  . Overactive bladder 05/07/2011  . Pacemaker   . Peripheral neuropathy   . Shingles 8 yrs ago  . Sick sinus syndrome ([place])   . Urinary incontinence   . Venous insufficiency   . Vitamin D  deficiency     Past Surgical History:  Procedure Laterality Date  . APPENDECTOMY  1971  . CARDIAC CATHETERIZATION  05/10/1997   Normal coronaries, MVP  . CARDIOVERSION N/A 06/29/2020   Procedure: CARDIOVERSION;  Surgeon: Buford Dresser, MD;  Location: [facility] ENDOSCOPY;  Service: Cardiovascular;  Laterality: N/A;  . CERVICAL SPINE SURGERY    . CHOLECYSTECTOMY  03/2005   by Dr. Dalbert Batman  . COLONOSCOPY  02/05/2007,02/05/05   Dr. Silvano Rusk  . ESOPHAGOGASTRODUODENOSCOPY  02/05/2005,01/22/90   Dr. Silvano Rusk, Dr. Rachelle Hora  . NM MYOCAR PERF WALL MOTION  08/24/2010   Normal  . PACEMAKER GENERATOR CHANGE N/A 02/25/2012   Procedure: PACEMAKER GENERATOR CHANGE;  Surgeon: Sanda Klein, MD;  Location: [facility] CATH LAB;  Service: Cardiovascular;  Laterality: N/A;  . pacemaker implanted  05/11/97,08/02/05   [place]  . PPM GENERATOR CHANGEOUT N/A 06/29/2020   Procedure: PPM GENERATOR CHANGEOUT;  Surgeon: Sanda Klein, MD;  Location: [facility] CV LAB;  Service: Cardiovascular;  Laterality: N/A;  . TONSILLECTOMY    . TOTAL ABDOMINAL HYSTERECTOMY W/ BILATERAL SALPINGOOPHORECTOMY      Current Medications: Outpatient Medications Prior to Visit  Medication Sig Dispense Refill  . cholecalciferol (VITAMIN D) 1000 UNITS tablet Take 1,000 Units by mouth daily.    Marland Kitchen ELIQUIS 2.5 MG TABS tablet TAKE 1 TABLET TWICE A DAY (Patient taking differently: Take 2.5 mg by mouth 2 (two) times daily. ) 180 tablet 1  . gabapentin (NEURONTIN) 300 MG capsule TAKE 1 CAPSULE TWICE A DAY (Patient taking differently: Take 300 mg by mouth 2 (two) times daily. ) 180 capsule 3  . mirabegron ER (MYRBETRIQ) 25 MG TB24 tablet Take 1 tablet (25 mg total) by mouth daily.    . pantoprazole (PROTONIX) 40 MG tablet Take 1 tablet (40 mg total) by mouth daily.    . polyvinyl alcohol (ARTIFICIAL TEARS) 1.4 % ophthalmic solution Place 1 drop into both eyes 3 (three) times daily as needed for dry eyes.    . pravastatin (PRAVACHOL) 40  MG tablet TAKE 1 TABLET DAILY (Patient taking differently: Take 40 mg by mouth daily. ) 90 tablet 3  . SYNTHROID 137 MCG tablet TAKE 1 TABLET DAILY (Patient taking differently: Take 137 mcg by mouth daily before breakfast. ) 90 tablet 3  . PACERONE 200 MG tablet TAKE ONE-HALF (1/2) TABLET DAILY 45 tablet 3   No facility-administered medications prior to visit.     Allergies:   Iodinated diagnostic agents, Cymbalta [duloxetine hcl], and Other   Social History   Socioeconomic History  . Marital status: Widowed    Spouse name: Not on file  . Number of children: 3  . Years of education: Not on file  . Highest education level: Not on file  Occupational History  . Occupation: retired    Fish farm manager:  RETIRED  Tobacco Use  . Smoking status: Former Smoker    Packs/day: 2.00    Years: 6.00    Pack years: 12.00    Types: Cigarettes    Quit date: 10/15/1951    Years since quitting: 68.7  . Smokeless tobacco: Never Used  Vaping Use  . Vaping Use: Never used  Substance and Sexual Activity  . Alcohol use: No  . Drug use: No  . Sexual activity: Not on file  Other Topics Concern  . Not on file  Social History Narrative   She is a widow, one son to daughters. She is retired.   Social Determinants of Health   Financial Resource Strain: Low Risk   . Difficulty of Paying Living Expenses: Not hard at all  Food Insecurity: No Food Insecurity  . Worried About Charity fundraiser in the Last Year: Never true  . Ran Out of Food in the Last Year: Never true  Transportation Needs: No Transportation Needs  . Lack of Transportation (Medical): No  . Lack of Transportation (Non-Medical): No  Physical Activity:   . Days of Exercise per Week: Not on file  . Minutes of Exercise per Session: Not on file  Stress:   . Feeling of Stress : Not on file  Social Connections:   . Frequency of Communication with Friends and Family: Not on file  . Frequency of Social Gatherings with Friends and Family: Not on  file  . Attends Religious Services: Not on file  . Active Member of Clubs or Organizations: Not on file  . Attends Archivist Meetings: Not on file  . Marital Status: Not on file     Family History:  The patient's family history includes Alcohol abuse in her daughter; Cancer (age of onset: 50) in her sister; Colon cancer in an other family member; Diabetes in her brother, brother, daughter, father, sister, sister, and son; Diabetes (age of onset: 34) in her mother; Gallbladder disease in her father; Heart attack in her brother; Hypertension in her daughter and sister; Pancreatic cancer in her mother; Stomach cancer in her maternal aunt.   ROS:   Please see the history of present illness.    ROS All other systems reviewed and are negative.   PHYSICAL EXAM:   VS:  BP (!) 107/46   Pulse 70   Ht 5\' 6"  (1.676 m)   Wt 180 lb 3.2 oz (81.7 kg)   SpO2 97%   BMI 29.09 kg/m  General: Alert, oriented x3, no distress, appears elderly and fairly frail.  The right subclavian pacemaker site is healing well.  The Steri-Strips are still in place.  There is no swelling, bleeding, redness or warmth Head: no evidence of trauma, PERRL, EOMI, no exophtalmos or lid lag, no myxedema, no xanthelasma; normal ears, nose and oropharynx Neck: Hard to see the jugular venous pulsations, but I believe they are elevated at about 8-9 cm; brisk carotid pulses without delay and no carotid bruits Chest: clear to auscultation, no signs of consolidation by percussion or palpation, normal fremitus, symmetrical and full respiratory excursions Cardiovascular: normal position and quality of the apical impulse, regular rhythm, normal first and second heart sounds, no murmurs, rubs or gallops Abdomen: no tenderness or distention, no masses by palpation, no abnormal pulsatility or arterial bruits, normal bowel sounds, no hepatosplenomegaly Extremities: Lower extremities are wrapped in bandages but there is no obvious  clubbing or cyanosis she has 2+ symmetrical calf edema; 2+ radial, ulnar and brachial  pulses bilaterally; 2+ right femoral, posterior tibial and dorsalis pedis pulses; 2+ left femoral, posterior tibial and dorsalis pedis pulses; no subclavian or femoral bruits Neurological: grossly nonfocal except very hard of hearing Psych: Normal mood and affect   Wt Readings from Last 3 Encounters:  07/13/20 180 lb 3.2 oz (81.7 kg)  07/03/20 184 lb 4.9 oz (83.6 kg)  06/05/20 170 lb (77.1 kg)      Studies/Labs Reviewed:   ECHO:   1. Left ventricular ejection fraction, by estimation, is 60 to 65%. The left ventricle has normal function. The left ventricle has no regional wall motion abnormalities. Left ventricular diastolic parameters are indeterminate.  2. Right ventricular systolic function is normal. The right ventricular size is small. There is moderately elevated pulmonary artery systolic pressure.  3. Right atrial size was mildly dilated.  4. The mitral valve is myxomatous. Mild mitral valve regurgitation. There is mild prolapse of anterior leaflet of the mitral valve.  5. The aortic valve is normal in structure. Aortic valve regurgitation is not visualized. No aortic stenosis is present.   EKG:  EKG is ordered today and shows background atrial fibrillation with 100% ventricular paced rhythm  Recent Labs: 02/09/2020: ALT 10 06/27/2020: B Natriuretic Peptide 1,310.8; Magnesium 1.8; TSH 4.508 07/01/2020: Hemoglobin 9.2; Platelets 156 07/02/2020: BUN 62; Creatinine, Ser 2.63; Potassium 4.9; Sodium 137   Lipid Panel    Component Value Date/Time   CHOL 129 05/14/2019 0915   TRIG 100 05/14/2019 0915   HDL 43 05/14/2019 0915   CHOLHDL 3.0 05/14/2019 0915   CHOLHDL 4 09/24/2018 1417   VLDL 46.8 (H) 09/24/2018 1417   LDLCALC 66 05/14/2019 0915   LDLDIRECT 76.0 09/24/2018 1417    ASSESSMENT:    1. Persistent atrial fibrillation ([place])   2. Acute on chronic diastolic heart failure ([place])     3. SSS (sick sinus syndrome) ([place])   4. CHB (complete heart block) ([facility])   5. Long term current use of anticoagulant therapy   6. Pacemaker -  [place] leads implanted in 1998 generator change 2006 and 2013 and 2021   7. Encounter for monitoring amiodarone therapy   8. Essential hypertension   9. Hypercholesterolemia      PLAN:  In order of problems listed above:   1. CHF: Preserved left ventricular systolic function.  High risk of recurrent acute decompensation due to the presence of atrial fibrillation, which led to her most recent hospitalization.  We will start furosemide and potassium supplements.  We will schedule cardioversion as soon as possible, but we should wait until she achieves higher levels of amiodarone to increase the likelihood of maintenance of normal rhythm long-term.  Very important to continue to weigh her on a daily basis.  [facility] to call us if her weight exceeds 185 pounds.  If she is discharged from the hospital continued weight monitoring at home is critical.  Sodium restriction reviewed. 2. Afib: Unfortunately she had early recurrence of atrial fibrillation.  Increase amiodarone to 400 mg daily for 1 week, then reduce to 200 mg daily and schedule for cardioversion in approximately 2 weeks.  Made it clear that it is important that she should not miss any doses of Eliquis between now and the cardioversion. CHADSVasc at least 5 (age 35, gender, CHF, hypertension). This procedure has been fully reviewed with the patient and written informed consent has been obtained. 3. SSS: Very high frequency of atrial pacing.  Heart rate histogram is appropriate for  her sedentary lifestyle. 4. CHB: pacemaker dependent (this was the case even before we started amiodarone). 5. Anticoagulation: Eliquis dose adjusted for advanced age and poor renal function.  No bleeding complications. 6. Pacemaker: Recent generator change out September 16, normal device function.   Surgical site appears to be healing well, Steri-Strips should be left in place to come off spontaneously. 7. Amiodarone: Normal liver and thyroid function tests a few days ago.  Check every 6 months. 8. HTN: Blood pressure is very well controlled despite the fact that she is no longer taking metoprolol. 9. HLP: Satisfactory lipid parameters by last assay a year ago.  I do not think we need to repeat this just yet.; history of normal coronary arteries by angiography in 1998, normal nuclear stress test 2014     Medication Adjustments/Labs and Tests Ordered: Current medicines are reviewed at length with the patient today.  Concerns regarding medicines are outlined above.  Medication changes, Labs and Tests ordered today are listed in the Patient Instructions below. Patient Instructions  Medication Instructions:  AMIODARONE: Take 400 mg (2 tablets) once daily for one week then take 200 mg once daily START Furosemide 40 mg once daily START Potassium 20 mEq once daily  Weigh yourself daily and call the office if the weight is over 185 pounds. [phone])  *If you need a refill on your cardiac medications before your next appointment, please call your pharmacy*   Follow-Up: At [facility], you and your health needs are our priority.  As part of our continuing mission to provide you with exceptional heart care, we have created designated Provider Care Teams.  These Care Teams include your primary Cardiologist (physician) and Advanced Practice Providers (APPs -  Physician Assistants and Nurse Practitioners) who all work together to provide you with the care you need, when you need it.  We recommend signing up for the patient portal called "MyChart".  Sign up information is provided on this After Visit Summary.  MyChart is used to connect with patients for Virtual Visits (Telemedicine).  Patients are able to view lab/test results, encounter notes, upcoming appointments, etc.  Non-urgent messages  can be sent to your provider as well.   To learn more about what you can do with MyChart, go to [email or web address].    Your next appointment:   Follow up with Dr. Sallyanne Kuster on 09/04/20 at 8:40 am  Other Instructions/  You are scheduled for a Cardioversion on 07/28/20 with Dr. Sallyanne Kuster.  Please arrive at the [facility] (Main Entrance A) at [facility]: [address] [place], [place] [address] at 8:30 am . (1 hour prior to procedure unless lab work is needed)  DIET: Nothing to eat or drink after midnight except a sip of water with medications (see medication instructions below)  Medication Instructions: Hold the Furosemide the morning of the procedure  Continue your anticoagulant: Eliquis. If you miss a dose please call the office You will need to continue your anticoagulant after your procedure until you  are told by your  Provider that it is safe to stop   Labs:  Your provider would like for you to return on 07/25/20 to have the following labs drawn: CBC and BMET. You do not need an appointment for the lab. Once in our office lobby there is a podium where you can sign in and ring the doorbell to alert [place] that you are here. The lab is open from 8:00 am to 4:30 pm; closed for lunch from 12:45pm-1:45pm.  You will need to have the coronavirus test completed prior to your procedure. An appointment has been made at 2:45 pm on 07/25/20. This is a Drive Up Visit at [address], [place], [place]/ [address]. Please tell them that you are there for procedure testing. Stay in your car and someone will be with you shortly. Please make sure to have all other labs completed before this test because you will need to stay quarantined until your procedure.    You must have a responsible person to drive you home and stay in the waiting area during your procedure. Failure to do so could result in cancellation.  Bring your insurance cards.  *Special Note: Every effort is made to have  your procedure done on time. Occasionally there are emergencies that occur at the hospital that may cause delays. Please be patient if a delay does occur.       Signed, Sanda Klein, MD  07/13/2020 7:17 PM    [facility] [place], [place], [place]  [address] Phone: [phone]; Fax: [phone]

## 2020-07-13 NOTE — Patient Instructions (Addendum)
Medication Instructions:  AMIODARONE: Take 400 mg (2 tablets) once daily for one week then take 200 mg once daily START Furosemide 40 mg once daily START Potassium 20 mEq once daily  Weigh yourself daily and call the office if the weight is over 185 pounds. [phone])  *If you need a refill on your cardiac medications before your next appointment, please call your pharmacy*   Follow-Up: At [facility], you and your health needs are our priority.  As part of our continuing mission to provide you with exceptional heart care, we have created designated Provider Care Teams.  These Care Teams include your primary Cardiologist (physician) and Advanced Practice Providers (APPs -  Physician Assistants and Nurse Practitioners) who all work together to provide you with the care you need, when you need it.  We recommend signing up for the patient portal called "MyChart".  Sign up information is provided on this After Visit Summary.  MyChart is used to connect with patients for Virtual Visits (Telemedicine).  Patients are able to view lab/test results, encounter notes, upcoming appointments, etc.  Non-urgent messages can be sent to your provider as well.   To learn more about what you can do with MyChart, go to [email or web address].    Your next appointment:   Follow up with Dr. Sallyanne Kuster on 09/04/20 at 8:40 am  Other Instructions/  You are scheduled for a Cardioversion on 07/28/20 with Dr. Sallyanne Kuster.  Please arrive at the [facility] (Main Entrance A) at [facility]: [address] [place], [place] [address] at 8:30 am . (1 hour prior to procedure unless lab work is needed)  DIET: Nothing to eat or drink after midnight except a sip of water with medications (see medication instructions below)  Medication Instructions: Hold the Furosemide the morning of the procedure  Continue your anticoagulant: Eliquis. If you miss a dose please call the office You will need to continue your  anticoagulant after your procedure until you  are told by your  Provider that it is safe to stop   Labs:  Your provider would like for you to return on 07/25/20 to have the following labs drawn: CBC and BMET. You do not need an appointment for the lab. Once in our office lobby there is a podium where you can sign in and ring the doorbell to alert [place] that you are here. The lab is open from 8:00 am to 4:30 pm; closed for lunch from 12:45pm-1:45pm.  You will need to have the coronavirus test completed prior to your procedure. An appointment has been made at 2:45 pm on 07/25/20. This is a Drive Up Visit at [address], [place], [place]/ [address]. Please tell them that you are there for procedure testing. Stay in your car and someone will be with you shortly. Please make sure to have all other labs completed before this test because you will need to stay quarantined until your procedure.    You must have a responsible person to drive you home and stay in the waiting area during your procedure. Failure to do so could result in cancellation.  Bring your insurance cards.  *Special Note: Every effort is made to have your procedure done on time. Occasionally there are emergencies that occur at the hospital that may cause delays. Please be patient if a delay does occur.

## 2020-07-17 NOTE — Addendum Note (Signed)
Addended by: Wonda Horner on: 07/17/2020 02:52 PM   Modules accepted: Orders

## 2020-07-22 DIAGNOSIS — R300 Dysuria: Secondary | ICD-10-CM | POA: Diagnosis not present

## 2020-07-24 ENCOUNTER — Other Ambulatory Visit: Payer: Self-pay | Admitting: *Deleted

## 2020-07-25 ENCOUNTER — Other Ambulatory Visit (HOSPITAL_COMMUNITY): Payer: Medicare Other

## 2020-07-27 ENCOUNTER — Other Ambulatory Visit: Payer: Self-pay | Admitting: *Deleted

## 2020-07-27 DIAGNOSIS — R300 Dysuria: Secondary | ICD-10-CM | POA: Diagnosis not present

## 2020-07-27 DIAGNOSIS — E559 Vitamin D deficiency, unspecified: Secondary | ICD-10-CM | POA: Diagnosis not present

## 2020-07-27 DIAGNOSIS — L97509 Non-pressure chronic ulcer of other part of unspecified foot with unspecified severity: Secondary | ICD-10-CM | POA: Diagnosis not present

## 2020-07-27 DIAGNOSIS — D649 Anemia, unspecified: Secondary | ICD-10-CM | POA: Diagnosis not present

## 2020-07-27 NOTE — Progress Notes (Signed)
Spoke with Jill Washington at [facility]. Patient has not been Covid tested, will bring here at 6:30 for rapid test. Has been approved by Short Stay to do in our dept. Confirmed NPO, no missed doses of Eliquis. Patient is an adequate historian and should be able to answer all our questions.

## 2020-07-27 NOTE — Patient Outreach (Signed)
[facility] Post- Acute Care Coordinator follow up. Member screened for potential [facility] Care Management needs as a benefit of [place] [employer].  Verified in Patient Pearletha [place] that member transitioned to private pay at [facility]   No identifiable [facility] Care Management needs at this time.   Marthenia Rolling, MSN-Ed, RN,BSN [place] Acute Care Coordinator [phone] [facility]) [phone]  (Toll free office)

## 2020-07-28 ENCOUNTER — Ambulatory Visit (HOSPITAL_COMMUNITY): Payer: Medicare Other | Admitting: Anesthesiology

## 2020-07-28 ENCOUNTER — Encounter (HOSPITAL_COMMUNITY)
Admission: RE | Disposition: A | Discharge status: Skilled Nursing Facility | Payer: Medicare Other | Source: Home / Self Care | Attending: Cardiovascular Disease

## 2020-07-28 ENCOUNTER — Encounter (HOSPITAL_COMMUNITY): Payer: Self-pay | Admitting: Cardiovascular Disease

## 2020-07-28 ENCOUNTER — Other Ambulatory Visit: Payer: Self-pay

## 2020-07-28 ENCOUNTER — Ambulatory Visit (HOSPITAL_COMMUNITY)
Admission: RE | Admit: 2020-07-28 | Discharge: 2020-07-28 | Disposition: A | Discharge status: Skilled Nursing Facility | Payer: Medicare Other | Attending: Cardiovascular Disease | Admitting: Cardiovascular Disease

## 2020-07-28 DIAGNOSIS — E785 Hyperlipidemia, unspecified: Secondary | ICD-10-CM | POA: Diagnosis not present

## 2020-07-28 DIAGNOSIS — K589 Irritable bowel syndrome without diarrhea: Secondary | ICD-10-CM | POA: Insufficient documentation

## 2020-07-28 DIAGNOSIS — Z20822 Contact with and (suspected) exposure to covid-19: Secondary | ICD-10-CM | POA: Insufficient documentation

## 2020-07-28 DIAGNOSIS — I4819 Other persistent atrial fibrillation: Secondary | ICD-10-CM | POA: Diagnosis not present

## 2020-07-28 DIAGNOSIS — Z87891 Personal history of nicotine dependence: Secondary | ICD-10-CM | POA: Insufficient documentation

## 2020-07-28 DIAGNOSIS — E039 Hypothyroidism, unspecified: Secondary | ICD-10-CM | POA: Diagnosis not present

## 2020-07-28 DIAGNOSIS — I341 Nonrheumatic mitral (valve) prolapse: Secondary | ICD-10-CM | POA: Insufficient documentation

## 2020-07-28 DIAGNOSIS — E559 Vitamin D deficiency, unspecified: Secondary | ICD-10-CM | POA: Diagnosis not present

## 2020-07-28 DIAGNOSIS — E7800 Pure hypercholesterolemia, unspecified: Secondary | ICD-10-CM | POA: Diagnosis not present

## 2020-07-28 DIAGNOSIS — Z79890 Hormone replacement therapy (postmenopausal): Secondary | ICD-10-CM | POA: Diagnosis not present

## 2020-07-28 DIAGNOSIS — Z79899 Other long term (current) drug therapy: Secondary | ICD-10-CM | POA: Diagnosis not present

## 2020-07-28 DIAGNOSIS — I110 Hypertensive heart disease with heart failure: Secondary | ICD-10-CM | POA: Insufficient documentation

## 2020-07-28 DIAGNOSIS — Z888 Allergy status to other drugs, medicaments and biological substances status: Secondary | ICD-10-CM | POA: Diagnosis not present

## 2020-07-28 DIAGNOSIS — I5033 Acute on chronic diastolic (congestive) heart failure: Secondary | ICD-10-CM | POA: Diagnosis not present

## 2020-07-28 DIAGNOSIS — M797 Fibromyalgia: Secondary | ICD-10-CM | POA: Insufficient documentation

## 2020-07-28 DIAGNOSIS — Z950 Presence of cardiac pacemaker: Secondary | ICD-10-CM | POA: Insufficient documentation

## 2020-07-28 DIAGNOSIS — Z7901 Long term (current) use of anticoagulants: Secondary | ICD-10-CM | POA: Diagnosis not present

## 2020-07-28 DIAGNOSIS — I495 Sick sinus syndrome: Secondary | ICD-10-CM | POA: Insufficient documentation

## 2020-07-28 DIAGNOSIS — I480 Paroxysmal atrial fibrillation: Secondary | ICD-10-CM | POA: Diagnosis not present

## 2020-07-28 DIAGNOSIS — I442 Atrioventricular block, complete: Secondary | ICD-10-CM | POA: Diagnosis not present

## 2020-07-28 HISTORY — PX: CARDIOVERSION: SHX1299

## 2020-07-28 LAB — POCT I-STAT, CHEM 8
BUN: 55 mg/dL — ABNORMAL HIGH (ref 8–23)
Calcium, Ion: 1.36 mmol/L (ref 1.15–1.40)
Chloride: 104 mmol/L (ref 98–111)
Creatinine, Ser: 3.2 mg/dL — ABNORMAL HIGH (ref 0.44–1.00)
Glucose, Bld: 94 mg/dL (ref 70–99)
HCT: 39 % (ref 36.0–46.0)
Hemoglobin: 13.3 g/dL (ref 12.0–15.0)
Potassium: 4.7 mmol/L (ref 3.5–5.1)
Sodium: 139 mmol/L (ref 135–145)
TCO2: 25 mmol/L (ref 22–32)

## 2020-07-28 LAB — SARS CORONAVIRUS 2 BY RT PCR (HOSPITAL ORDER, PERFORMED IN ~~LOC~~ HOSPITAL LAB): SARS Coronavirus 2: NEGATIVE

## 2020-07-28 SURGERY — CARDIOVERSION
Anesthesia: General

## 2020-07-28 MED ORDER — SODIUM CHLORIDE 0.9 % IV SOLN: INTRAVENOUS | Status: DC

## 2020-07-28 MED ORDER — LIDOCAINE 2% (20 MG/ML) 5 ML SYRINGE
INTRAMUSCULAR | Status: DC | PRN
  Administered 2020-07-28: 40 mg via INTRAVENOUS

## 2020-07-28 MED ORDER — PROPOFOL 10 MG/ML IV BOLUS
INTRAVENOUS | Status: DC | PRN
  Administered 2020-07-28: 60 mg via INTRAVENOUS

## 2020-07-28 NOTE — Progress Notes (Signed)
Steri strips removed from device site per MD Croitoru order.  Site clean, dry, and intact.

## 2020-07-28 NOTE — Anesthesia Preprocedure Evaluation (Addendum)
Anesthesia Evaluation  Patient identified by MRN, date of birth, ID band Patient awake    Reviewed: Allergy & Precautions, NPO status , Patient's Chart, lab work & pertinent test results  Airway Mallampati: I  TM Distance: >3 FB Neck ROM: Full    Dental  (+) Upper Dentures, Lower Dentures   Pulmonary former smoker,    Pulmonary exam normal        Cardiovascular hypertension, +CHF  + dysrhythmias Atrial Fibrillation + pacemaker + Valvular Problems/Murmurs MVP  Rhythm:Irregular Rate:Normal     Neuro/Psych negative psych ROS   GI/Hepatic Neg liver ROS, hiatal hernia, GERD  ,  Endo/Other  Hypothyroidism   Renal/GU      Musculoskeletal  (+) Arthritis , Fibromyalgia -  Abdominal Normal abdominal exam  (+)   Peds  Hematology   Anesthesia Other Findings   Reproductive/Obstetrics                            Anesthesia Physical Anesthesia Plan  ASA: III  Anesthesia Plan: General   Post-op Pain Management:    Induction:   PONV Risk Score and Plan: 0 and Propofol infusion  Airway Management Planned: Natural Airway and Simple Face Mask  Additional Equipment: None  Intra-op Plan:   Post-operative Plan:   Informed Consent:   Plan Discussed with: CRNA  Anesthesia Plan Comments: (Echo: 1. Left ventricular ejection fraction, by estimation, is 60 to 65%. The  left ventricle has normal function. The left ventricle has no regional  wall motion abnormalities. Left ventricular diastolic parameters are  indeterminate.  2. Right ventricular systolic function is normal. The right ventricular  size is small. There is moderately elevated pulmonary artery systolic  pressure.  3. Right atrial size was mildly dilated.  4. The mitral valve is myxomatous. Mild mitral valve regurgitation. There  is mild prolapse of anterior leaflet of the mitral valve.  5. The aortic valve is normal in structure.  Aortic valve regurgitation is  not visualized. No aortic stenosis is present. )        Anesthesia Quick Evaluation

## 2020-07-28 NOTE — Op Note (Signed)
Procedure: Electrical Cardioversion Indications:  Atrial Fibrillation  Procedure Details:  Consent: Risks of procedure as well as the alternatives and risks of each were explained to the (patient/caregiver).  Consent for procedure obtained.  Time Out: Verified patient identification, verified procedure, site/side was marked, verified correct patient position, special equipment/implants available, medications/allergies/relevent history reviewed, required imaging and test results available.  Performed  Patient placed on cardiac monitor, pulse oximetry, supplemental oxygen as necessary.  Sedation given: propofol IV, Dr. Smith Robert Pacer pads placed anterior and posterior chest.  Cardioverted 1 time(s).  Cardioversion with synchronized biphasic 150J shock.  Evaluation: Findings: Post procedure EKG shows: AV sequential pacing Complications: None Patient did tolerate procedure well.  Normal pacemaker check after cardioversion. Lower rate limit increased to 70 bpm, atrial overdrive pacing turned "on" at 80 bpm.  Time Spent Directly with the Patient:  30 minutes   Jill Washington 07/28/2020, 9:35 AM

## 2020-07-28 NOTE — Anesthesia Procedure Notes (Signed)
Procedure Name: General with mask airway Date/Time: 07/28/2020 9:45 AM Performed by: Leonor Liv, CRNA Patient Re-evaluated:Patient Re-evaluated prior to induction Oxygen Delivery Method: Ambu bag Dental Injury: Teeth and Oropharynx as per pre-operative assessment

## 2020-07-28 NOTE — Transfer of Care (Signed)
Immediate Anesthesia Transfer of Care Note  Patient: Jill Washington  Procedure(s) Performed: CARDIOVERSION (N/A )  Patient Location: Endoscopy Unit  Anesthesia Type:General  Level of Consciousness: drowsy  Airway & Oxygen Therapy: Patient Spontanous Breathing and Patient connected to nasal cannula oxygen  Post-op Assessment: Report given to RN and Post -op Vital signs reviewed and stable  Post vital signs: Reviewed and stable  Last Vitals:  Vitals Value Taken Time  BP    Temp    Pulse    Resp    SpO2      Last Pain:  Vitals:   07/28/20 0914  TempSrc: Oral  PainSc: 0-No pain         Complications: No complications documented.

## 2020-07-28 NOTE — Progress Notes (Signed)
Report given to Caryl Pina, Therapist, sports at [employer].  No medication changes.   RN verbalized understanding.

## 2020-07-28 NOTE — Interval H&P Note (Signed)
History and Physical Interval Note:  07/28/2020 8:59 AM  Jill Washington  has presented today for surgery, with the diagnosis of AFIB.  The various methods of treatment have been discussed with the patient and family. After consideration of risks, benefits and other options for treatment, the patient has consented to  Procedure(s): CARDIOVERSION (N/A) as a surgical intervention.  The patient's history has been reviewed, patient examined, no change in status, stable for surgery.  I have reviewed the patient's chart and labs.  Questions were answered to the patient's satisfaction.     [name]

## 2020-07-28 NOTE — Anesthesia Postprocedure Evaluation (Signed)
Anesthesia Post Note  Patient: Jill Washington  Procedure(s) Performed: CARDIOVERSION (N/A )     Patient location during evaluation: PACU Anesthesia Type: General Level of consciousness: awake and alert Pain management: pain level controlled Vital Signs Assessment: post-procedure vital signs reviewed and stable Respiratory status: spontaneous breathing, nonlabored ventilation, respiratory function stable and patient connected to nasal cannula oxygen Cardiovascular status: blood pressure returned to baseline and stable Postop Assessment: no apparent nausea or vomiting Anesthetic complications: no   No complications documented.  Last Vitals:  Vitals:   07/28/20 0956 07/28/20 1012  BP: (!) 102/36 (!) 95/40  Pulse: 70 70  Resp: 13 12  Temp:  (!) 36.3 C  SpO2: 100% 99%    Last Pain:  Vitals:   07/28/20 1012  TempSrc: Temporal  PainSc: 0-No pain                 Effie Berkshire

## 2020-07-30 DIAGNOSIS — I480 Paroxysmal atrial fibrillation: Secondary | ICD-10-CM | POA: Diagnosis not present

## 2020-07-30 DIAGNOSIS — N184 Chronic kidney disease, stage 4 (severe): Secondary | ICD-10-CM | POA: Diagnosis not present

## 2020-07-30 DIAGNOSIS — I5032 Chronic diastolic (congestive) heart failure: Secondary | ICD-10-CM | POA: Diagnosis not present

## 2020-07-31 ENCOUNTER — Encounter (HOSPITAL_COMMUNITY): Payer: Self-pay | Admitting: Cardiovascular Disease

## 2020-08-09 DIAGNOSIS — L988 Other specified disorders of the skin and subcutaneous tissue: Secondary | ICD-10-CM | POA: Diagnosis not present

## 2020-08-16 DIAGNOSIS — L89610 Pressure ulcer of right heel, unstageable: Secondary | ICD-10-CM | POA: Diagnosis not present

## 2020-08-16 DIAGNOSIS — L988 Other specified disorders of the skin and subcutaneous tissue: Secondary | ICD-10-CM | POA: Diagnosis not present

## 2020-08-19 DIAGNOSIS — I10 Essential (primary) hypertension: Secondary | ICD-10-CM | POA: Diagnosis not present

## 2020-08-23 DIAGNOSIS — L89610 Pressure ulcer of right heel, unstageable: Secondary | ICD-10-CM | POA: Diagnosis not present

## 2020-08-23 DIAGNOSIS — L988 Other specified disorders of the skin and subcutaneous tissue: Secondary | ICD-10-CM | POA: Diagnosis not present

## 2020-08-24 ENCOUNTER — Other Ambulatory Visit: Payer: Self-pay | Admitting: Family Medicine

## 2020-08-29 DIAGNOSIS — I10 Essential (primary) hypertension: Secondary | ICD-10-CM | POA: Diagnosis not present

## 2020-08-30 DIAGNOSIS — E559 Vitamin D deficiency, unspecified: Secondary | ICD-10-CM | POA: Diagnosis not present

## 2020-08-30 DIAGNOSIS — L988 Other specified disorders of the skin and subcutaneous tissue: Secondary | ICD-10-CM | POA: Diagnosis not present

## 2020-08-30 DIAGNOSIS — D649 Anemia, unspecified: Secondary | ICD-10-CM | POA: Diagnosis not present

## 2020-08-30 DIAGNOSIS — L89610 Pressure ulcer of right heel, unstageable: Secondary | ICD-10-CM | POA: Diagnosis not present

## 2020-08-30 DIAGNOSIS — R300 Dysuria: Secondary | ICD-10-CM | POA: Diagnosis not present

## 2020-08-30 DIAGNOSIS — I10 Essential (primary) hypertension: Secondary | ICD-10-CM | POA: Diagnosis not present

## 2020-09-04 ENCOUNTER — Encounter: Payer: Self-pay | Admitting: Cardiovascular Disease

## 2020-09-04 ENCOUNTER — Other Ambulatory Visit: Payer: Self-pay

## 2020-09-04 ENCOUNTER — Ambulatory Visit (INDEPENDENT_AMBULATORY_CARE_PROVIDER_SITE_OTHER): Payer: Medicare Other | Admitting: Cardiovascular Disease

## 2020-09-04 VITALS — BP 118/58 | HR 70 | Ht 66.0 in | Wt 182.0 lb

## 2020-09-04 DIAGNOSIS — I4819 Other persistent atrial fibrillation: Secondary | ICD-10-CM

## 2020-09-04 DIAGNOSIS — I442 Atrioventricular block, complete: Secondary | ICD-10-CM

## 2020-09-04 DIAGNOSIS — Z5181 Encounter for therapeutic drug level monitoring: Secondary | ICD-10-CM | POA: Diagnosis not present

## 2020-09-04 DIAGNOSIS — Z79899 Other long term (current) drug therapy: Secondary | ICD-10-CM

## 2020-09-04 DIAGNOSIS — I10 Essential (primary) hypertension: Secondary | ICD-10-CM

## 2020-09-04 DIAGNOSIS — E7800 Pure hypercholesterolemia, unspecified: Secondary | ICD-10-CM | POA: Diagnosis not present

## 2020-09-04 DIAGNOSIS — N184 Chronic kidney disease, stage 4 (severe): Secondary | ICD-10-CM

## 2020-09-04 DIAGNOSIS — Z7901 Long term (current) use of anticoagulants: Secondary | ICD-10-CM | POA: Diagnosis not present

## 2020-09-04 DIAGNOSIS — I5032 Chronic diastolic (congestive) heart failure: Secondary | ICD-10-CM

## 2020-09-04 DIAGNOSIS — Z950 Presence of cardiac pacemaker: Secondary | ICD-10-CM

## 2020-09-04 DIAGNOSIS — I495 Sick sinus syndrome: Secondary | ICD-10-CM

## 2020-09-04 DIAGNOSIS — E039 Hypothyroidism, unspecified: Secondary | ICD-10-CM | POA: Diagnosis not present

## 2020-09-04 LAB — COMPREHENSIVE METABOLIC PANEL
ALT: 8 IU/L (ref 0–32)
AST: 16 IU/L (ref 0–40)
Albumin/Globulin Ratio: 1.1 — ABNORMAL LOW (ref 1.2–2.2)
Albumin: 3.5 g/dL (ref 3.5–4.6)
Alkaline Phosphatase: 82 IU/L (ref 44–121)
BUN/Creatinine Ratio: 15 (ref 12–28)
BUN: 41 mg/dL — ABNORMAL HIGH (ref 10–36)
Bilirubin Total: 0.2 mg/dL (ref 0.0–1.2)
CO2: 25 mmol/L (ref 20–29)
Calcium: 11.5 mg/dL — ABNORMAL HIGH (ref 8.7–10.3)
Chloride: 104 mmol/L (ref 96–106)
Creatinine, Ser: 2.71 mg/dL — ABNORMAL HIGH (ref 0.57–1.00)
GFR calc Af Amer: 17 mL/min/{1.73_m2} — ABNORMAL LOW (ref >59)
GFR calc non Af Amer: 15 mL/min/{1.73_m2} — ABNORMAL LOW (ref >59)
Globulin, Total: 3.1 g/dL (ref 1.5–4.5)
Glucose: 80 mg/dL (ref 65–99)
Potassium: 5.7 mmol/L — ABNORMAL HIGH (ref 3.5–5.2)
Sodium: 141 mmol/L (ref 134–144)
Total Protein: 6.6 g/dL (ref 6.0–8.5)

## 2020-09-04 LAB — T4, FREE: Free T4: 2.41 ng/dL — ABNORMAL HIGH (ref 0.82–1.77)

## 2020-09-04 LAB — TSH: TSH: 3.46 u[IU]/mL (ref 0.450–4.500)

## 2020-09-04 NOTE — Patient Instructions (Signed)
Medication Instructions:  No changes *If you need a refill on your cardiac medications before your next appointment, please call your pharmacy*   Lab Work: Your provider would like for you to have the following labs today: CMET, TSH and Free T4  If you have labs (blood work) drawn today and your tests are completely normal, you will receive your results only by: Marland Kitchen MyChart Message (if you have MyChart) OR . A paper copy in the mail If you have any lab test that is abnormal or we need to change your treatment, we will call you to review the results.   Testing/Procedures: None ordered   Follow-Up: At [facility], you and your health needs are our priority.  As part of our continuing mission to provide you with exceptional heart care, we have created designated Provider Care Teams.  These Care Teams include your primary Cardiologist (physician) and Advanced Practice Providers (APPs -  Physician Assistants and Nurse Practitioners) who all work together to provide you with the care you need, when you need it.  We recommend signing up for the patient portal called "MyChart".  Sign up information is provided on this After Visit Summary.  MyChart is used to connect with patients for Virtual Visits (Telemedicine).  Patients are able to view lab/test results, encounter notes, upcoming appointments, etc.  Non-urgent messages can be sent to your provider as well.   To learn more about what you can do with MyChart, go to [email or web address].    Your next appointment:   12 month(s)  The format for your next appointment:   In Person  Provider:   Sanda Klein, MD

## 2020-09-04 NOTE — Progress Notes (Signed)
Patient ID: Jill Washington, female   DOB: 18-Feb-1926, 86 y.o.   MRN: [id number]    Cardiology Office Note    Date:  09/05/2020   ID:  Jill Washington, DOB Jun 05, 1926, MRN [id number]  PCP:  Mosie Lukes, MD  Cardiologist:   Sanda Klein, MD   Chief Complaint  Patient presents with  . Atrial Fibrillation  . Pacemaker Check    History of Present Illness:  Jill Washington is a 86 y.o. female with complete heart block, recent pacemaker generator change ([address] [employer], September 2021, leads from 1998) sinus node dysfunction, recurrent paroxysmal and persistent atrial fibrillation, CKD stage IV, hypothyroidism on levothyroxine supplementation.  She was recently hospitalized for acute on chronic diastolic heart failure due to persistent atrial fibrillation.  She had cardioversion on September 16.  During the same hospitalization she underwent pacemaker generator change out since her device had reached elective replacement indicator.  LVEF is normal at 60 to 65%, but she has mild-moderate pulmonary artery hypertension (systolic PA P approximately 50 mmHg).  Since hospital discharge she has been at [facility] home.  She generally feels well.  Denies problems with shortness of breath (spends most of the day in bed or in a chair).  Definitely no orthopnea or PND.  Occasional lower extremity edema.  Has gained 8 pounds since hospital discharge following her cardioversion and pacemaker generator change out.  Denies any palpitations and has not had dizziness or syncope.  She is quite sedentary.  No focal neurological events, no falls and no bleeding problems.  She has complete heart block and is pacemaker dependent.  Historically, she has had paroxysmal atrial fibrillation with a low burden around 2%, at all other times 100% atrial paced, ventricular paced.  But she has had a couple of lengthy episodes of persistent atrial fib (3 weeks in May, 1 month in September, the latter associated with  apparent heart failure decompensation)  Pacemaker function is normal.  She has not had atrial fibrillation since device implantation.  She has an estimated generator longevity of 9-10 years ([place], implanted 06/29/2020).  There have been no episodes of high ventricular rates.  Lead parameters are normal (atrial threshold testing could not be performed).  She has always had a positive R wave in lead V1, concerning for epicardial position of the RV lead tip (judging by her chest x-ray it is likely that the lead is in the middle cardiac vein).  She is on chronic amiodarone therapy but was only taking 100 mg once daily until the recent episode of atrial fibrillation, now on amiodarone 200 mg daily.  Liver function tests checked today were normal, but she has conflicting results on her thyroid panel (normal TSH, elevated free T4).  She is on anticoagulation with Eliquis and has not had any bleeding problems, falls or injuries.  The dose of Eliquis is adjusted for her advanced age and abnormal renal function.     Past Medical History:  Diagnosis Date  . Arthritis   . Blood transfusion   . CHB (complete heart block) ([place]) 11/10/2014  . Chicken pox as a child  . Colon polyps    hyperplastic  . Critical illness polyneuropathy ([place])   . Diarrhea 04/25/2017  . Diverticulosis of sigmoid colon 02/05/2007   mild  . Fecal incontinence   . Fibromyalgia   . GERD (gastroesophageal reflux disease)   . H/O measles   . H/O mumps   . Hiatal hernia   .  History of atrial fibrillation   . History of chicken pox   . History of empyema of pleura   . History of gallstones   . History of skin cancer    face  . Hypercholesteremia   . Hypertension   . Hypothyroidism   . IBS (irritable bowel syndrome)   . Measles as a child  . Medicare annual wellness visit, subsequent 05/15/2015   Allergies verified: UTD   Immunization Status: Flu vaccine-- NA  Tdap-- 9-10 years ago per patient  PNA-- 09/30/07  (23)  Shingles-- 04/07/14   A/P:  Changes to [place], [place] or Personal Hx: UTD Pap-- none recent  MMG--declines further MMgs (last 08/12/03 BI-RADS 1: Neg)  Bone Density-- 08/11/03 with Prescott Gum, MD- Normal, declines further testing CCS-- 05/07/12 with Silvano Rusk, MD- polyp removed- no   . Mitral valve prolapse   . Mumps as a child  . Overactive bladder 05/07/2011  . Pacemaker   . Peripheral neuropathy   . Shingles 8 yrs ago  . Sick sinus syndrome ([facility])   . Urinary incontinence   . Venous insufficiency   . Vitamin D deficiency     Past Surgical History:  Procedure Laterality Date  . APPENDECTOMY  1971  . CARDIAC CATHETERIZATION  05/10/1997   Normal coronaries, MVP  . CARDIOVERSION N/A 06/29/2020   Procedure: CARDIOVERSION;  Surgeon: Buford Dresser, MD;  Location: [facility] ENDOSCOPY;  Service: Cardiovascular;  Laterality: N/A;  . CARDIOVERSION N/A 07/28/2020   Procedure: CARDIOVERSION;  Surgeon: Sanda Klein, MD;  Location: [facility] ENDOSCOPY;  Service: Cardiovascular;  Laterality: N/A;  . CERVICAL SPINE SURGERY    . CHOLECYSTECTOMY  03/2005   by Dr. Dalbert Batman  . COLONOSCOPY  02/05/2007,02/05/05   Dr. Silvano Rusk  . ESOPHAGOGASTRODUODENOSCOPY  02/05/2005,01/22/90   Dr. Silvano Rusk, Dr. Rachelle Hora  . NM MYOCAR PERF WALL MOTION  08/24/2010   Normal  . PACEMAKER GENERATOR CHANGE N/A 02/25/2012   Procedure: PACEMAKER GENERATOR CHANGE;  Surgeon: Sanda Klein, MD;  Location: [facility] CATH LAB;  Service: Cardiovascular;  Laterality: N/A;  . pacemaker implanted  05/11/97,08/02/05   [place]  . PPM GENERATOR CHANGEOUT N/A 06/29/2020   Procedure: PPM GENERATOR CHANGEOUT;  Surgeon: Sanda Klein, MD;  Location: [facility] CV LAB;  Service: Cardiovascular;  Laterality: N/A;  . TONSILLECTOMY    . TOTAL ABDOMINAL HYSTERECTOMY W/ BILATERAL SALPINGOOPHORECTOMY      Current Medications: Outpatient Medications Prior to Visit  Medication Sig Dispense Refill  . amiodarone (PACERONE) 200 MG tablet Take 1  tablet (200 mg total) by mouth daily. (Patient taking differently: Take 200 mg by mouth daily. (0900)) 90 tablet 3  . cholecalciferol (VITAMIN D) 1000 UNITS tablet Take 1,000 Units by mouth daily. (0900)    . ELIQUIS 2.5 MG TABS tablet TAKE 1 TABLET TWICE A DAY (Patient taking differently: Take 2.5 mg by mouth 2 (two) times daily. (0900 & 2100)) 180 tablet 1  . furosemide (LASIX) 40 MG tablet Take 1 tablet (40 mg total) by mouth daily. (Patient taking differently: Take 40 mg by mouth daily. (0900)) 90 tablet 3  . gabapentin (NEURONTIN) 300 MG capsule TAKE 1 CAPSULE TWICE A DAY (Patient taking differently: Take 300 mg by mouth 2 (two) times daily. (0900 & 2100)) 180 capsule 3  . Infant Care Products (DERMACLOUD) CREA Apply 1 application topically in the morning, at noon, and at bedtime. Apply to sacrum & buttocks topically every shift for protection.    Marland Kitchen levothyroxine (SYNTHROID) 150 MCG tablet Take 150 mcg  by mouth daily at 6 (six) AM. (0630)    . mirabegron ER (MYRBETRIQ) 25 MG TB24 tablet Take 1 tablet (25 mg total) by mouth daily. (Patient taking differently: Take 25 mg by mouth daily. (0900))    . pantoprazole (PROTONIX) 40 MG tablet Take 1 tablet (40 mg total) by mouth daily. (Patient taking differently: Take 40 mg by mouth daily. (0900))    . polyvinyl alcohol (ARTIFICIAL TEARS) 1.4 % ophthalmic solution Place 1 drop into both eyes 3 (three) times daily as needed for dry eyes.    . potassium chloride SA (KLOR-CON) 20 MEQ tablet Take 20 mEq by mouth daily. (0900)    . pravastatin (PRAVACHOL) 40 MG tablet TAKE 1 TABLET DAILY (Patient taking differently: Take 40 mg by mouth daily. (0900)) 90 tablet 3   No facility-administered medications prior to visit.     Allergies:   Iodinated diagnostic agents, Cymbalta [duloxetine hcl], and Other   Social History   Socioeconomic History  . Marital status: Widowed    Spouse name: Not on file  . Number of children: 3  . Years of education: Not on file   . Highest education level: Not on file  Occupational History  . Occupation: retired    Fish farm manager: RETIRED  Tobacco Use  . Smoking status: Former Smoker    Packs/day: 2.00    Years: 6.00    Pack years: 12.00    Types: Cigarettes    Quit date: 10/15/1951    Years since quitting: 68.9  . Smokeless tobacco: Never Used  Vaping Use  . Vaping Use: Never used  Substance and Sexual Activity  . Alcohol use: No  . Drug use: No  . Sexual activity: Not on file  Other Topics Concern  . Not on file  Social History Narrative   She is a widow, one son to daughters. She is retired.   Social Determinants of Health   Financial Resource Strain: Low Risk   . Difficulty of Paying Living Expenses: Not hard at all  Food Insecurity: No Food Insecurity  . Worried About Charity fundraiser in the Last Year: Never true  . Ran Out of Food in the Last Year: Never true  Transportation Needs: No Transportation Needs  . Lack of Transportation (Medical): No  . Lack of Transportation (Non-Medical): No  Physical Activity:   . Days of Exercise per Week: Not on file  . Minutes of Exercise per Session: Not on file  Stress:   . Feeling of Stress : Not on file  Social Connections:   . Frequency of Communication with Friends and Family: Not on file  . Frequency of Social Gatherings with Friends and Family: Not on file  . Attends Religious Services: Not on file  . Active Member of Clubs or Organizations: Not on file  . Attends Archivist Meetings: Not on file  . Marital Status: Not on file     Family History:  The patient's family history includes Alcohol abuse in her daughter; Cancer (age of onset: 36) in her sister; Colon cancer in an other family member; Diabetes in her brother, brother, daughter, father, sister, sister, and son; Diabetes (age of onset: 50) in her mother; Gallbladder disease in her father; Heart attack in her brother; Hypertension in her daughter and sister; Pancreatic cancer in her  mother; Stomach cancer in her maternal aunt.   ROS:   Please see the history of present illness.    ROS All other systems reviewed and  are negative.   PHYSICAL EXAM:   VS:  BP (!) 118/58   Pulse 70   Ht 5\' 6"  (1.676 m)   Wt 182 lb (82.6 kg)   SpO2 96%   BMI 29.38 kg/m   General: Alert, oriented x3, no distress, well-healed left subclavian pacemaker site Head: no evidence of trauma, PERRL, EOMI, no exophtalmos or lid lag, no myxedema, no xanthelasma; normal ears, nose and oropharynx Neck: Difficult to see the jugular venous pulsations and no hepatojugular reflux; brisk carotid pulses without delay and no carotid bruits Chest: clear to auscultation, no signs of consolidation by percussion or palpation, normal fremitus, symmetrical and full respiratory excursions Cardiovascular: normal position and quality of the apical impulse, regular rhythm, normal first and paradoxically split second heart sounds, no murmurs, rubs or gallops Abdomen: no tenderness or distention, no masses by palpation, no abnormal pulsatility or arterial bruits, normal bowel sounds, no hepatosplenomegaly Extremities: no clubbing, cyanosis; 2+ symmetrical soft pitting ankle edema; 2+ radial, ulnar and brachial pulses bilaterally; 2+ right femoral, posterior tibial and dorsalis pedis pulses; 2+ left femoral, posterior tibial and dorsalis pedis pulses; no subclavian or femoral bruits Neurological: grossly nonfocal Psych: Normal mood and affect    Wt Readings from Last 3 Encounters:  09/04/20 182 lb (82.6 kg)  07/28/20 174 lb (78.9 kg)  07/13/20 180 lb 3.2 oz (81.7 kg)      Studies/Labs Reviewed:   ECHO 15 Jan 15, 2020:   1. Left ventricular ejection fraction, by estimation, is 60 to 65%. The left ventricle has normal function. The left ventricle has no regional wall motion abnormalities. Left ventricular diastolic parameters are indeterminate.  2. Right ventricular systolic function is normal. The right ventricular  size is small. There is moderately elevated pulmonary artery systolic pressure.  3. Right atrial size was mildly dilated.  4. The mitral valve is myxomatous. Mild mitral valve regurgitation. There is mild prolapse of anterior leaflet of the mitral valve.  5. The aortic valve is normal in structure. Aortic valve regurgitation is not visualized. No aortic stenosis is present.   EKG:  EKG is ordered today and shows atrial paced, ventricular paced rhythm.  There is a large distinct positive initial R wave in lead V1.  Recent Labs: 06/27/2020: B Natriuretic Peptide 1,310.8; Magnesium 1.8 07/01/2020: Platelets 156 07/28/2020: Hemoglobin 13.3 09/04/2020: ALT 8; BUN 41; Creatinine, Ser 2.71; Potassium 5.7; Sodium 141; TSH 3.460   Lipid Panel    Component Value Date/Time   CHOL 129 05/14/2019 0915   TRIG 100 05/14/2019 0915   HDL 43 05/14/2019 0915   CHOLHDL 3.0 05/14/2019 0915   CHOLHDL 4 09/24/2018 1417   VLDL 46.8 (H) 09/24/2018 1417   LDLCALC 66 05/14/2019 0915   LDLDIRECT 76.0 09/24/2018 1417    ASSESSMENT:    1. Chronic diastolic heart failure (HCC)   2. Persistent atrial fibrillation ([place])   3. SSS (sick sinus syndrome) ([place])   4. CHB (complete heart block) (HCC)   5. Long term current use of anticoagulant therapy   6. Pacemaker -  [place] leads implanted in 01-14-97 generator change 2006, 201304-12-2019   7. Encounter for monitoring amiodarone therapy   8. Acquired hypothyroidism   9. Essential hypertension   10. Hypercholesterolemia   11. CKD (chronic kidney disease) stage 4, GFR 15-29 ml/min (HCC)   12. Medication management      PLAN:  In order of problems listed above:   1. CHF: Preserved left ventricular systolic function.  Has had decompensation  when she has persistent atrial fibrillation.  Weight has increased since her last visit, but she has no dyspnea and has only mild edema.  Need to strike a balance between risk of heart failure decompensation and risk of worsening  renal function.  I have previously asked [facility] nursing [facility] to call us if her weight exceeds 185 pounds.  Continue with sodium restricted diet.  We will leave on current dose of furosemide and reduce the potassium. 2. Afib: Maintaining sinus rhythm for the last couple of months, on amiodarone 200 mg daily CHADSVasc at least 5 (age 63, gender, CHF, hypertension). This procedure has been fully reviewed with the patient and written informed consent has been obtained. 3. SSS: Very high frequency of atrial pacing.  Heart rate histogram is appropriate for her sedentary lifestyle. 4. CHB: pacemaker dependent (this was the case even before we started amiodarone). 5. Anticoagulation: Eliquis dose adjusted for advanced age and poor renal function.  No bleeding complications. 6. Pacemaker: Recent generator change out September 16, normal device function.  Surgical site appears to be healing well, Steri-Strips should be left in place to come off spontaneously. 7. Amiodarone: Normal liver function tests.  Check every 6 months. 8. Hypothyroidism: Had been stable on the current dose of levothyroxine 150 mcg daily for a long time, but on the higher dose of amiodarone has some conflicting thyroid function studies.  Clinically she appears to be euthyroid.  We will recheck labs in about a month.  No changes made to the levothyroxine dose at this point. 9. HTN: Excellent control 10. HLP: Satisfactory lipid parameters by last assay a year ago, LDL was 66.  On pravastatin.  History of normal coronary arteries by angiography in 1998, normal nuclear stress test 2014. 11. CKD 4: Improving trend in renal parameters on labs performed today, but not back to what appears to be her baseline creatinine of around 2.0 (GFR of 20).  Need to tolerate some degree of lower extremity edema as long as her breathing is okay. 12. Hyperkalemia: Results came back after she left the office.  Will send instructions to her nursing facility to reduce  the potassium to 10 mEq daily and recheck labs in a month.     Medication Adjustments/Labs and Tests Ordered: Current medicines are reviewed at length with the patient today.  Concerns regarding medicines are outlined above.  Medication changes, Labs and Tests ordered today are listed in the Patient Instructions below. Patient Instructions  Medication Instructions:  No changes *If you need a refill on your cardiac medications before your next appointment, please call your pharmacy*   Lab Work: Your provider would like for you to have the following labs today: CMET, TSH and Free T4  If you have labs (blood work) drawn today and your tests are completely normal, you will receive your results only by: Marland Kitchen MyChart Message (if you have MyChart) OR . A paper copy in the mail If you have any lab test that is abnormal or we need to change your treatment, we will call you to review the results.   Testing/Procedures: None ordered   Follow-Up: At [facility], you and your health needs are our priority.  As part of our continuing mission to provide you with exceptional heart care, we have created designated Provider Care Teams.  These Care Teams include your primary Cardiologist (physician) and Advanced Practice Providers (APPs -  Physician Assistants and Nurse Practitioners) who all work together to provide you with the care you  need, when you need it.  We recommend signing up for the patient portal called "MyChart".  Sign up information is provided on this After Visit Summary.  MyChart is used to connect with patients for Virtual Visits (Telemedicine).  Patients are able to view lab/test results, encounter notes, upcoming appointments, etc.  Non-urgent messages can be sent to your provider as well.   To learn more about what you can do with MyChart, go to [email or web address].    Your next appointment:   12 month(s)  The format for your next appointment:   In Person  Provider:     Sanda Klein, MD       Signed, Sanda Klein, MD  09/05/2020 9:03 AM    [facility] [address], [place], [place]  [address] Phone: [phone]; Fax: [phone]

## 2020-09-05 ENCOUNTER — Encounter: Payer: Self-pay | Admitting: Cardiovascular Disease

## 2020-09-06 ENCOUNTER — Telehealth: Payer: Self-pay | Admitting: Cardiovascular Disease

## 2020-09-06 DIAGNOSIS — L988 Other specified disorders of the skin and subcutaneous tissue: Secondary | ICD-10-CM | POA: Diagnosis not present

## 2020-09-10 DIAGNOSIS — I5043 Acute on chronic combined systolic (congestive) and diastolic (congestive) heart failure: Secondary | ICD-10-CM | POA: Diagnosis not present

## 2020-09-10 DIAGNOSIS — I10 Essential (primary) hypertension: Secondary | ICD-10-CM | POA: Diagnosis not present

## 2020-09-10 DIAGNOSIS — G9009 Other idiopathic peripheral autonomic neuropathy: Secondary | ICD-10-CM | POA: Diagnosis not present

## 2020-09-10 DIAGNOSIS — E039 Hypothyroidism, unspecified: Secondary | ICD-10-CM | POA: Diagnosis not present

## 2020-09-10 DIAGNOSIS — I4891 Unspecified atrial fibrillation: Secondary | ICD-10-CM | POA: Diagnosis not present

## 2020-09-12 ENCOUNTER — Other Ambulatory Visit: Payer: Self-pay | Admitting: *Deleted

## 2020-09-12 DIAGNOSIS — I495 Sick sinus syndrome: Secondary | ICD-10-CM

## 2020-09-12 DIAGNOSIS — E039 Hypothyroidism, unspecified: Secondary | ICD-10-CM

## 2020-09-12 DIAGNOSIS — I5032 Chronic diastolic (congestive) heart failure: Secondary | ICD-10-CM

## 2020-09-12 MED ORDER — POTASSIUM CHLORIDE CRYS ER 10 MEQ PO TBCR: 10.0000 meq | EXTENDED_RELEASE_TABLET | Freq: Every day | ORAL | 11 refills | Status: AC

## 2020-09-12 MED ORDER — LEVOTHYROXINE SODIUM 100 MCG PO TABS: 100.0000 ug | ORAL_TABLET | Freq: Every day | ORAL | 11 refills | Status: AC

## 2020-09-13 DIAGNOSIS — L988 Other specified disorders of the skin and subcutaneous tissue: Secondary | ICD-10-CM | POA: Diagnosis not present

## 2020-09-15 ENCOUNTER — Telehealth: Payer: Self-pay | Admitting: Cardiovascular Disease

## 2020-09-15 NOTE — Telephone Encounter (Signed)
Spoke with Jill Washington at [facility]. Patient was originally at facility for short term rehab but is now in long term care and will not be returning home. Jill Washington states their physician will be assuming care of patient and he wants to know if Dr. Sallyanne Kuster wants to continue ordering labs and following closely or if he wants to allow facility physician to assume primary care. Jill Washington does not want to have multiple lab orders and medications orders coming from multiple physicians if possible. Physician at facility does order routine labs and checks weekly weights.   Did ask that facility also speak with family and get their opinion. At this time next follow up appointment is in 12 months.   Will route to Dr. Sallyanne Kuster and primary nurse for review.

## 2020-09-15 NOTE — Telephone Encounter (Signed)
Jill Washington, Doctor, hospital with [facility] is calling regarding the fax that [employer]. sent them. He states that patient has been transitioned to long term care with them and has several questions regarding the orders. Please call/advise  Thank you!

## 2020-09-15 NOTE — Telephone Encounter (Signed)
The facility physician should feel free to assume primary care, I am only providing Cardiology care. The labs and orders she has recently received are related to her recent heart failure decompensation, atrial fibrillation requiring cardioversion, need for pacemaker generator change (in other words they were not part of "routine care"). If she stays out of AFib and CHF, I do not anticipate that there will be additional meds changed or labs ordered on my part. She will require monitoring of liver function tests and TSH every 6 months due to chronic amiodarone therapy. She will require testing of renal function and potassium levels every 3 months due to CHF and advanced CKD. CBC should be checked periodically due to treatment with anticoagulation. I will be more than glad to defer ordering those tests to them, but request that they forward copies to [place].  Having said that, weekly weight monitoring is insufficient for this patient at this time. She should be weighed daily and weight gain >3 lb/24h or >5 lb in a week should be promptly reported.  I will be monitoring her pacemaker remotely every 3 months, next due on Dec 16. Her remote transmitter should left by her bedside, within 6 feet and plugged in to power.  Will be glad to further clarify these orders/requests if they wish.

## 2020-09-15 NOTE — Telephone Encounter (Signed)
Spoke with Olen Cordial and informed him of Dr. Victorino December recommendations. Noah verbalized understanding.

## 2020-09-18 ENCOUNTER — Other Ambulatory Visit: Payer: Self-pay

## 2020-09-18 DIAGNOSIS — I872 Venous insufficiency (chronic) (peripheral): Secondary | ICD-10-CM

## 2020-09-20 DIAGNOSIS — L988 Other specified disorders of the skin and subcutaneous tissue: Secondary | ICD-10-CM | POA: Diagnosis not present

## 2020-09-26 ENCOUNTER — Other Ambulatory Visit: Payer: Self-pay

## 2020-09-26 ENCOUNTER — Ambulatory Visit (HOSPITAL_COMMUNITY)
Admission: RE | Admit: 2020-09-26 | Discharge: 2020-09-26 | Disposition: A | Discharge status: Home / Self Care | Payer: Medicare Other | Source: Ambulatory Visit | Attending: Vascular Surgery | Admitting: Vascular Surgery

## 2020-09-26 ENCOUNTER — Ambulatory Visit (INDEPENDENT_AMBULATORY_CARE_PROVIDER_SITE_OTHER): Payer: Medicare Other | Admitting: Vascular Surgery

## 2020-09-26 ENCOUNTER — Encounter: Payer: Self-pay | Admitting: Vascular Surgery

## 2020-09-26 VITALS — BP 119/63 | HR 68 | Temp 98.1°F | Resp 20 | Ht 66.0 in | Wt 182.0 lb

## 2020-09-26 DIAGNOSIS — M79606 Pain in leg, unspecified: Secondary | ICD-10-CM

## 2020-09-26 DIAGNOSIS — I872 Venous insufficiency (chronic) (peripheral): Secondary | ICD-10-CM

## 2020-09-26 NOTE — Progress Notes (Signed)
ASSESSMENT & PLAN:  86 y.o. female with no evidence of hemodynamically significant peripheral arterial disease. Follow up PRN.  CHIEF COMPLAINT:   Leg pain  HISTORY:  HISTORY OF PRESENT ILLNESS: Jill Washington is a 86 y.o. female nursing home resident who presents to office for evaluation of leg pain. The nursing home performed an ankle brachial index which was reportedly abnormal. She has no caregiver with her today. She is pleasantly confused. She is not oriented to person, place, time, or situation.   Past Medical History:  Diagnosis Date  . Arthritis   . Blood transfusion   . CHB (complete heart block) ([place]) 11/10/2014  . Chicken pox as a child  . Colon polyps    hyperplastic  . Critical illness polyneuropathy ([place])   . Diarrhea 04/25/2017  . Diverticulosis of sigmoid colon 02/05/2007   mild  . Fecal incontinence   . Fibromyalgia   . GERD (gastroesophageal reflux disease)   . H/O measles   . H/O mumps   . Hiatal hernia   . History of atrial fibrillation   . History of chicken pox   . History of empyema of pleura   . History of gallstones   . History of skin cancer    face  . Hypercholesteremia   . Hypertension   . Hypothyroidism   . IBS (irritable bowel syndrome)   . Measles as a child  . Medicare annual wellness visit, subsequent 05/15/2015   Allergies verified: UTD   Immunization Status: Flu vaccine-- NA  Tdap-- 9-10 years ago per patient  PNA-- 09/30/07 (23)  Shingles-- 04/07/14   A/P:  Changes to [place], [place] or Personal Hx: UTD Pap-- none recent  MMG--declines further MMgs (last 08/12/03 BI-RADS 1: Neg)  Bone Density-- 08/11/03 with Prescott Gum, MD- Normal, declines further testing CCS-- 05/07/12 with Silvano Rusk, MD- polyp removed- no   . Mitral valve prolapse   . Mumps as a child  . Overactive bladder 05/07/2011  . Pacemaker   . Peripheral neuropathy   . Shingles 8 yrs ago  . Sick sinus syndrome ([place])   . Urinary incontinence   . Venous insufficiency   .  Vitamin D deficiency     Past Surgical History:  Procedure Laterality Date  . APPENDECTOMY  1971  . CARDIAC CATHETERIZATION  05/10/1997   Normal coronaries, MVP  . CARDIOVERSION N/A 06/29/2020   Procedure: CARDIOVERSION;  Surgeon: Buford Dresser, MD;  Location[facility] ENDOSCOPY;  Service: Cardiovascular;  Laterality: N/A;  . CARDIOVERSION N/A 07/28/2020   Procedure: CARDIOVERSION;  Surgeon: Sanda Klein, MD;  Location: [facility] ENDOSCOPY;  Service: Cardiovascular;  Laterality: N/A;  . CERVICAL SPINE SURGERY    . CHOLECYSTECTOMY  03/2005   by Dr. Dalbert Batman  . COLONOSCOPY  02/05/2007,02/05/05   Dr. Silvano Rusk  . ESOPHAGOGASTRODUODENOSCOPY  02/05/2005,01/22/90   Dr. Silvano Rusk, Dr. Rachelle Hora  . NM MYOCAR PERF WALL MOTION  08/24/2010   Normal  . PACEMAKER GENERATOR CHANGE N/A 02/25/2012   Procedure: PACEMAKER GENERATOR CHANGE;  Surgeon: Sanda Klein, MD;  Location: [facility] CATH LAB;  Service: Cardiovascular;  Laterality: N/A;  . pacemaker implanted  05/11/97,08/02/05   [place]  . PPM GENERATOR CHANGEOUT N/A 06/29/2020   Procedure: PPM GENERATOR CHANGEOUT;  Surgeon: Sanda Klein, MD;  Location: [facility] CV LAB;  Service: Cardiovascular;  Laterality: N/A;  . TONSILLECTOMY    . TOTAL ABDOMINAL HYSTERECTOMY W/ BILATERAL SALPINGOOPHORECTOMY      Family History  Problem Relation Age of Onset  .  Pancreatic cancer Mother   . Diabetes Mother 54       type 2  . Heart attack Brother   . Diabetes Brother        type 2  . Gallbladder disease Father   . Diabetes Father        type 2  . Diabetes Brother        X 2   . Cancer Sister 80       skin ca on face  . Diabetes Sister        type 2  . Diabetes Daughter        type 2  . Hypertension Daughter   . Alcohol abuse Daughter   . Diabetes Son        type 2  . Diabetes Sister        type 1  . Hypertension Sister   . Stomach cancer Maternal Aunt   . Colon cancer Other     Social History   Socioeconomic History  . Marital  status: Widowed    Spouse name: Not on file  . Number of children: 3  . Years of education: Not on file  . Highest education level: Not on file  Occupational History  . Occupation: retired    Fish farm manager: RETIRED  Tobacco Use  . Smoking status: Former Smoker    Packs/day: 2.00    Years: 6.00    Pack years: 12.00    Types: Cigarettes    Quit date: 10/15/1951    Years since quitting: 68.9  . Smokeless tobacco: Never Used  Vaping Use  . Vaping Use: Never used  Substance and Sexual Activity  . Alcohol use: No  . Drug use: No  . Sexual activity: Not on file  Other Topics Concern  . Not on file  Social History Narrative   She is a widow, one son to daughters. She is retired.   Social Determinants of Health   Financial Resource Strain: Low Risk   . Difficulty of Paying Living Expenses: Not hard at all  Food Insecurity: No Food Insecurity  . Worried About Charity fundraiser in the Last Year: Never true  . Ran Out of Food in the Last Year: Never true  Transportation Needs: No Transportation Needs  . Lack of Transportation (Medical): No  . Lack of Transportation (Non-Medical): No  Physical Activity: Not on file  Stress: Not on file  Social Connections: Not on file  Intimate Partner Violence: Not on file    Allergies  Allergen Reactions  . Iodinated Diagnostic Agents Other (See Comments)    Pt was not told what the reaction was  . Cymbalta [Duloxetine Hcl] Other (See Comments)    Could not think straight  . Other Itching    Dial Soap, (burns)    Current Outpatient Medications  Medication Sig Dispense Refill  . amiodarone (PACERONE) 200 MG tablet Take 1 tablet (200 mg total) by mouth daily. (Patient taking differently: Take 200 mg by mouth daily. (0900)) 90 tablet 3  . cholecalciferol (VITAMIN D) 1000 UNITS tablet Take 1,000 Units by mouth daily. (0900)    . ELIQUIS 2.5 MG TABS tablet TAKE 1 TABLET TWICE A DAY (Patient taking differently: Take 2.5 mg by mouth 2 (two) times  daily. (0900 & 2100)) 180 tablet 1  . furosemide (LASIX) 40 MG tablet Take 1 tablet (40 mg total) by mouth daily. (Patient taking differently: Take 40 mg by mouth daily. (0900)) 90 tablet 3  .  gabapentin (NEURONTIN) 300 MG capsule TAKE 1 CAPSULE TWICE A DAY (Patient taking differently: Take 300 mg by mouth 2 (two) times daily. (0900 & 2100)) 180 capsule 3  . Infant Care Products (DERMACLOUD) CREA Apply 1 application topically in the morning, at noon, and at bedtime. Apply to sacrum & buttocks topically every shift for protection.    Marland Kitchen levothyroxine (SYNTHROID) 100 MCG tablet Take 1 tablet (100 mcg total) by mouth daily at 6 (six) AM. (0630) 30 tablet 11  . mirabegron ER (MYRBETRIQ) 25 MG TB24 tablet Take 1 tablet (25 mg total) by mouth daily. (Patient taking differently: Take 25 mg by mouth daily. (0900))    . pantoprazole (PROTONIX) 40 MG tablet Take 1 tablet (40 mg total) by mouth daily. (Patient taking differently: Take 40 mg by mouth daily. (0900))    . polyvinyl alcohol (LIQUIFILM TEARS) 1.4 % ophthalmic solution Place 1 drop into both eyes 3 (three) times daily as needed for dry eyes.    . potassium chloride SA (KLOR-CON) 10 MEQ tablet Take 1 tablet (10 mEq total) by mouth daily. (0900) 30 tablet 11  . pravastatin (PRAVACHOL) 40 MG tablet TAKE 1 TABLET DAILY (Patient taking differently: Take 40 mg by mouth daily. (0900)) 90 tablet 3   No current facility-administered medications for this visit.    REVIEW OF SYSTEMS:  Unable to obtain - Demented  PHYSICAL EXAM:   Vitals:   09/26/20 1256  BP: 119/63  Pulse: 68  Resp: 20  Temp: 98.1 F (36.7 C)  SpO2: 96%  Weight: 182 lb (82.6 kg)  Height: 5\' 6"  (1.676 m)   Constitutional: Elderly demented woman in no distress.   Neurologic: Confined to wheelchair.  Psychiatric: Demented. Eyes: No icterus. No conjunctival pallor. Ears, nose, throat: mucous membranes moist. Midline trachea. No carotid bruit. Cardiac: regular rate and rhythm.   Respiratory: unlabored. Abdominal: soft, non-tender, non-distended. No palpable pulsatile abdominal mass. Peripheral vascular:  Non palpable pedal pulses. Extremity: No edema. No cyanosis. No pallor.  Skin: No gangrene. No ulceration.  Lymphatic: No Stemmer's sign. No palpable lymphadenopathy.   DATA REVIEW:    Most recent CBC CBC Latest Ref Rng & Units 07/28/2020 07/01/2020 06/27/2020  WBC 4.0 - 10.5 K/uL - 8.0 11.0(H)  Hemoglobin 12.0 - 15.0 g/dL 13.3 9.2(L) 11.3(L)  Hematocrit 36.0 - 46.0 % 39.0 29.5(L) 36.4  Platelets 150 - 400 K/uL - 156 205     Most recent CMP CMP Latest Ref Rng & Units 09/04/2020 07/28/2020 07/02/2020  Glucose 65 - 99 mg/dL 80 94 97  BUN 10 - 36 mg/dL 41(H) 55(H) 62(H)  Creatinine 0.57 - 1.00 mg/dL 2.71(H) 3.20(H) 2.63(H)  Sodium 134 - 144 mmol/L 141 139 137  Potassium 3.5 - 5.2 mmol/L 5.7(H) 4.7 4.9  Chloride 96 - 106 mmol/L 104 104 99  CO2 20 - 29 mmol/L 25 - 28  Calcium 8.7 - 10.3 mg/dL 11.5(H) - 9.0  Total Protein 6.0 - 8.5 g/dL 6.6 - -  Total Bilirubin 0.0 - 1.2 mg/dL 0.2 - -  Alkaline Phos 44 - 121 IU/L 82 - -  AST 0 - 40 IU/L 16 - -  ALT 0 - 32 IU/L 8 - -    Renal function CrCl cannot be calculated (Patient's most recent lab result is older than the maximum 21 days allowed.).  Hgb A1c MFr Bld (%)  Date Value  08/02/2015 5.3    LDL Calculated  Date Value Ref Range Status  05/14/2019 66 0 - 99 mg/dL  Final   Direct LDL  Date Value Ref Range Status  09/24/2018 76.0 mg/dL Final    Comment:    Optimal:  <100 mg/dLNear or Above Optimal:  100-129 mg/dLBorderline High:  130-159 mg/dLHigh:  160-189 mg/dLVery High:  >190 mg/dL     Vascular Imaging: ABI 09/26/20  LOWER EXTREMITY DOPPLER STUDY   Indications: Peripheral artery disease.   High Risk Factors: Hypertension.   Other Factors: Abnormal prior ABI.  Limitations: Today's exam was limited due to patient in wheelchair.   Performing Technologist: Alvia Grove RVT      Examination Guidelines: A complete evaluation includes at minimum, Doppler  waveform signals and systolic blood pressure reading at the level of  bilateral  brachial, anterior tibial, and posterior tibial arteries, when vessel  segments  are accessible. Bilateral testing is considered an integral part of a  complete  examination. Photoelectric Plethysmograph (PPG) waveforms and toe systolic  pressure readings are included as required and additional duplex testing  as  needed. Limited examinations for reoccurring indications may be performed  as  noted.     ABI Findings:  +---------+------------------+-----+---------+------------+  Right  Rt Pressure (mmHg)IndexWaveform Comment     +---------+------------------+-----+---------+------------+  Brachial 142                        +---------+------------------+-----+---------+------------+  PTA   >254       1.79 triphasic        +---------+------------------+-----+---------+------------+  DP    >254       1.79 biphasic         +---------+------------------+-----+---------+------------+  Great Toe                 not obtained  +---------+------------------+-----+---------+------------+   +--------+------------------+-----+---------+-------+  Left  Lt Pressure (mmHg)IndexWaveform Comment  +--------+------------------+-----+---------+-------+  [id number]                      +--------+------------------+-----+---------+-------+  PTA   >254       1.79 triphasic      +--------+------------------+-----+---------+-------+  DP   >253       1.78 triphasic      +--------+------------------+-----+---------+-------+   +-------+-----------+------------+------------+------------+  ABI/TBIToday's ABIToday's TBI Previous ABIPrevious TBI   +-------+-----------+------------+------------+------------+  Right [place]     not obtained              +-------+-----------+------------+------------+------------+  Left       not obtained              +-------+-----------+------------+------------+------------+       Summary:  Right: Resting right ankle-brachial index indicates noncompressible right  lower extremity arteries. Waveforms suggest adequate perfusion.   Left: Resting left ankle-brachial index indicates noncompressible left  lower extremity arteries. Waveforms suggest adequate perfuxion.      *See table(s) above for measurements and observations.     Yevonne Aline. Stanford Breed, MD [facility] Phone Number: [phone] 09/26/2020 3:41 PM

## 2020-09-27 DIAGNOSIS — L988 Other specified disorders of the skin and subcutaneous tissue: Secondary | ICD-10-CM | POA: Diagnosis not present

## 2020-09-28 ENCOUNTER — Ambulatory Visit (INDEPENDENT_AMBULATORY_CARE_PROVIDER_SITE_OTHER): Payer: Medicare Other

## 2020-09-28 DIAGNOSIS — I495 Sick sinus syndrome: Secondary | ICD-10-CM | POA: Diagnosis not present

## 2020-09-28 LAB — CUP PACEART REMOTE DEVICE CHECK
Battery Remaining Longevity: 109 mo
Battery Remaining Percentage: 95.5 %
Battery Voltage: 3.02 V
Brady Statistic AP VP Percent: 99 %
Brady Statistic AP VS Percent: 1 %
Brady Statistic AS VP Percent: 1 %
Brady Statistic AS VS Percent: 0 %
Brady Statistic RA Percent Paced: 99 %
Brady Statistic RV Percent Paced: 99 %
Date Time Interrogation Session: 20211216020013
Implantable Lead Implant Date: 19980729
Implantable Lead Implant Date: 19980729
Implantable Lead Location: 753859
Implantable Lead Location: 753860
Implantable Pulse Generator Implant Date: 20210916
Lead Channel Impedance Value: 410 Ohm
Lead Channel Impedance Value: 430 Ohm
Lead Channel Pacing Threshold Amplitude: 0.375 V
Lead Channel Pacing Threshold Amplitude: 1 V
Lead Channel Pacing Threshold Pulse Width: 0.5 ms
Lead Channel Pacing Threshold Pulse Width: 0.5 ms
Lead Channel Sensing Intrinsic Amplitude: 12 mV
Lead Channel Sensing Intrinsic Amplitude: 2.1 mV
Lead Channel Setting Pacing Amplitude: 1.25 V
Lead Channel Setting Pacing Amplitude: 1.375
Lead Channel Setting Pacing Pulse Width: 0.5 ms
Lead Channel Setting Sensing Sensitivity: 4 mV
Pulse Gen Model: 2272
Pulse Gen Serial Number: 3864504

## 2020-10-04 DIAGNOSIS — L988 Other specified disorders of the skin and subcutaneous tissue: Secondary | ICD-10-CM | POA: Diagnosis not present

## 2020-10-11 DIAGNOSIS — L988 Other specified disorders of the skin and subcutaneous tissue: Secondary | ICD-10-CM | POA: Diagnosis not present

## 2020-10-12 DIAGNOSIS — D649 Anemia, unspecified: Secondary | ICD-10-CM | POA: Diagnosis not present

## 2020-10-12 DIAGNOSIS — I10 Essential (primary) hypertension: Secondary | ICD-10-CM | POA: Diagnosis not present

## 2020-10-12 DIAGNOSIS — E559 Vitamin D deficiency, unspecified: Secondary | ICD-10-CM | POA: Diagnosis not present

## 2020-10-12 NOTE — Progress Notes (Signed)
Remote pacemaker transmission.   

## 2020-10-18 DIAGNOSIS — R531 Weakness: Secondary | ICD-10-CM | POA: Diagnosis not present

## 2020-10-18 DIAGNOSIS — L988 Other specified disorders of the skin and subcutaneous tissue: Secondary | ICD-10-CM | POA: Diagnosis not present

## 2020-10-23 DIAGNOSIS — I10 Essential (primary) hypertension: Secondary | ICD-10-CM | POA: Diagnosis not present

## 2020-10-23 DIAGNOSIS — R300 Dysuria: Secondary | ICD-10-CM | POA: Diagnosis not present

## 2020-10-23 DIAGNOSIS — D649 Anemia, unspecified: Secondary | ICD-10-CM | POA: Diagnosis not present

## 2020-10-23 DIAGNOSIS — E559 Vitamin D deficiency, unspecified: Secondary | ICD-10-CM | POA: Diagnosis not present

## 2020-10-25 DIAGNOSIS — L988 Other specified disorders of the skin and subcutaneous tissue: Secondary | ICD-10-CM | POA: Diagnosis not present

## 2020-11-01 DIAGNOSIS — L988 Other specified disorders of the skin and subcutaneous tissue: Secondary | ICD-10-CM | POA: Diagnosis not present

## 2020-11-08 DIAGNOSIS — L988 Other specified disorders of the skin and subcutaneous tissue: Secondary | ICD-10-CM | POA: Diagnosis not present

## 2020-11-08 DIAGNOSIS — L89152 Pressure ulcer of sacral region, stage 2: Secondary | ICD-10-CM | POA: Diagnosis not present

## 2020-11-14 DIAGNOSIS — I739 Peripheral vascular disease, unspecified: Secondary | ICD-10-CM | POA: Diagnosis not present

## 2020-11-14 DIAGNOSIS — L84 Corns and callosities: Secondary | ICD-10-CM | POA: Diagnosis not present

## 2020-11-14 DIAGNOSIS — M24572 Contracture, left ankle: Secondary | ICD-10-CM | POA: Diagnosis not present

## 2020-11-14 DIAGNOSIS — L602 Onychogryphosis: Secondary | ICD-10-CM | POA: Diagnosis not present

## 2020-11-15 DIAGNOSIS — L988 Other specified disorders of the skin and subcutaneous tissue: Secondary | ICD-10-CM | POA: Diagnosis not present

## 2020-11-15 DIAGNOSIS — L89152 Pressure ulcer of sacral region, stage 2: Secondary | ICD-10-CM | POA: Diagnosis not present

## 2020-11-20 DIAGNOSIS — Z7901 Long term (current) use of anticoagulants: Secondary | ICD-10-CM | POA: Diagnosis not present

## 2020-11-22 DIAGNOSIS — L988 Other specified disorders of the skin and subcutaneous tissue: Secondary | ICD-10-CM | POA: Diagnosis not present

## 2020-11-22 DIAGNOSIS — L89152 Pressure ulcer of sacral region, stage 2: Secondary | ICD-10-CM | POA: Diagnosis not present

## 2020-11-24 DIAGNOSIS — Z7901 Long term (current) use of anticoagulants: Secondary | ICD-10-CM | POA: Diagnosis not present

## 2020-11-27 DIAGNOSIS — Z7901 Long term (current) use of anticoagulants: Secondary | ICD-10-CM | POA: Diagnosis not present

## 2020-11-29 DIAGNOSIS — L89152 Pressure ulcer of sacral region, stage 2: Secondary | ICD-10-CM | POA: Diagnosis not present

## 2020-11-29 DIAGNOSIS — L988 Other specified disorders of the skin and subcutaneous tissue: Secondary | ICD-10-CM | POA: Diagnosis not present

## 2020-11-30 DIAGNOSIS — Z7901 Long term (current) use of anticoagulants: Secondary | ICD-10-CM | POA: Diagnosis not present

## 2020-12-01 DIAGNOSIS — Z7901 Long term (current) use of anticoagulants: Secondary | ICD-10-CM | POA: Diagnosis not present

## 2020-12-01 DIAGNOSIS — I10 Essential (primary) hypertension: Secondary | ICD-10-CM | POA: Diagnosis not present

## 2020-12-01 DIAGNOSIS — I4891 Unspecified atrial fibrillation: Secondary | ICD-10-CM | POA: Diagnosis not present

## 2020-12-02 DIAGNOSIS — I10 Essential (primary) hypertension: Secondary | ICD-10-CM | POA: Diagnosis not present

## 2020-12-02 DIAGNOSIS — Z7901 Long term (current) use of anticoagulants: Secondary | ICD-10-CM | POA: Diagnosis not present

## 2020-12-03 DIAGNOSIS — E46 Unspecified protein-calorie malnutrition: Secondary | ICD-10-CM | POA: Diagnosis not present

## 2020-12-03 DIAGNOSIS — N179 Acute kidney failure, unspecified: Secondary | ICD-10-CM | POA: Diagnosis not present

## 2020-12-03 DIAGNOSIS — E860 Dehydration: Secondary | ICD-10-CM | POA: Diagnosis not present

## 2020-12-03 DIAGNOSIS — I480 Paroxysmal atrial fibrillation: Secondary | ICD-10-CM | POA: Diagnosis not present

## 2020-12-03 DIAGNOSIS — L89159 Pressure ulcer of sacral region, unspecified stage: Secondary | ICD-10-CM | POA: Diagnosis not present

## 2020-12-04 DIAGNOSIS — R531 Weakness: Secondary | ICD-10-CM | POA: Diagnosis not present

## 2020-12-04 DIAGNOSIS — Z7901 Long term (current) use of anticoagulants: Secondary | ICD-10-CM | POA: Diagnosis not present

## 2020-12-04 DIAGNOSIS — M6281 Muscle weakness (generalized): Secondary | ICD-10-CM | POA: Diagnosis not present

## 2020-12-04 DIAGNOSIS — I5033 Acute on chronic diastolic (congestive) heart failure: Secondary | ICD-10-CM | POA: Diagnosis not present

## 2020-12-04 DIAGNOSIS — R2681 Unsteadiness on feet: Secondary | ICD-10-CM | POA: Diagnosis not present

## 2020-12-04 DIAGNOSIS — R293 Abnormal posture: Secondary | ICD-10-CM | POA: Diagnosis not present

## 2020-12-04 DIAGNOSIS — I872 Venous insufficiency (chronic) (peripheral): Secondary | ICD-10-CM | POA: Diagnosis not present

## 2020-12-04 DIAGNOSIS — R278 Other lack of coordination: Secondary | ICD-10-CM | POA: Diagnosis not present

## 2020-12-04 DIAGNOSIS — Z79899 Other long term (current) drug therapy: Secondary | ICD-10-CM | POA: Diagnosis not present

## 2020-12-04 DIAGNOSIS — G8929 Other chronic pain: Secondary | ICD-10-CM | POA: Diagnosis not present

## 2020-12-05 DIAGNOSIS — R2681 Unsteadiness on feet: Secondary | ICD-10-CM | POA: Diagnosis not present

## 2020-12-05 DIAGNOSIS — G8929 Other chronic pain: Secondary | ICD-10-CM | POA: Diagnosis not present

## 2020-12-05 DIAGNOSIS — I5033 Acute on chronic diastolic (congestive) heart failure: Secondary | ICD-10-CM | POA: Diagnosis not present

## 2020-12-05 DIAGNOSIS — R278 Other lack of coordination: Secondary | ICD-10-CM | POA: Diagnosis not present

## 2020-12-05 DIAGNOSIS — R293 Abnormal posture: Secondary | ICD-10-CM | POA: Diagnosis not present

## 2020-12-05 DIAGNOSIS — M6281 Muscle weakness (generalized): Secondary | ICD-10-CM | POA: Diagnosis not present

## 2020-12-06 DIAGNOSIS — R2681 Unsteadiness on feet: Secondary | ICD-10-CM | POA: Diagnosis not present

## 2020-12-06 DIAGNOSIS — L988 Other specified disorders of the skin and subcutaneous tissue: Secondary | ICD-10-CM | POA: Diagnosis not present

## 2020-12-06 DIAGNOSIS — L89153 Pressure ulcer of sacral region, stage 3: Secondary | ICD-10-CM | POA: Diagnosis not present

## 2020-12-06 DIAGNOSIS — R293 Abnormal posture: Secondary | ICD-10-CM | POA: Diagnosis not present

## 2020-12-06 DIAGNOSIS — I5033 Acute on chronic diastolic (congestive) heart failure: Secondary | ICD-10-CM | POA: Diagnosis not present

## 2020-12-06 DIAGNOSIS — M6281 Muscle weakness (generalized): Secondary | ICD-10-CM | POA: Diagnosis not present

## 2020-12-06 DIAGNOSIS — M533 Sacrococcygeal disorders, not elsewhere classified: Secondary | ICD-10-CM | POA: Diagnosis not present

## 2020-12-06 DIAGNOSIS — R278 Other lack of coordination: Secondary | ICD-10-CM | POA: Diagnosis not present

## 2020-12-06 DIAGNOSIS — G8929 Other chronic pain: Secondary | ICD-10-CM | POA: Diagnosis not present

## 2020-12-07 DIAGNOSIS — I5033 Acute on chronic diastolic (congestive) heart failure: Secondary | ICD-10-CM | POA: Diagnosis not present

## 2020-12-07 DIAGNOSIS — R2681 Unsteadiness on feet: Secondary | ICD-10-CM | POA: Diagnosis not present

## 2020-12-07 DIAGNOSIS — M6281 Muscle weakness (generalized): Secondary | ICD-10-CM | POA: Diagnosis not present

## 2020-12-07 DIAGNOSIS — G8929 Other chronic pain: Secondary | ICD-10-CM | POA: Diagnosis not present

## 2020-12-07 DIAGNOSIS — R278 Other lack of coordination: Secondary | ICD-10-CM | POA: Diagnosis not present

## 2020-12-07 DIAGNOSIS — Z7901 Long term (current) use of anticoagulants: Secondary | ICD-10-CM | POA: Diagnosis not present

## 2020-12-07 DIAGNOSIS — I4891 Unspecified atrial fibrillation: Secondary | ICD-10-CM | POA: Diagnosis not present

## 2020-12-07 DIAGNOSIS — R293 Abnormal posture: Secondary | ICD-10-CM | POA: Diagnosis not present

## 2020-12-08 DIAGNOSIS — G8929 Other chronic pain: Secondary | ICD-10-CM | POA: Diagnosis not present

## 2020-12-08 DIAGNOSIS — R278 Other lack of coordination: Secondary | ICD-10-CM | POA: Diagnosis not present

## 2020-12-08 DIAGNOSIS — R2681 Unsteadiness on feet: Secondary | ICD-10-CM | POA: Diagnosis not present

## 2020-12-08 DIAGNOSIS — I5033 Acute on chronic diastolic (congestive) heart failure: Secondary | ICD-10-CM | POA: Diagnosis not present

## 2020-12-08 DIAGNOSIS — M6281 Muscle weakness (generalized): Secondary | ICD-10-CM | POA: Diagnosis not present

## 2020-12-08 DIAGNOSIS — R293 Abnormal posture: Secondary | ICD-10-CM | POA: Diagnosis not present

## 2020-12-09 DIAGNOSIS — I4891 Unspecified atrial fibrillation: Secondary | ICD-10-CM | POA: Diagnosis not present

## 2020-12-11 DIAGNOSIS — R293 Abnormal posture: Secondary | ICD-10-CM | POA: Diagnosis not present

## 2020-12-11 DIAGNOSIS — G8929 Other chronic pain: Secondary | ICD-10-CM | POA: Diagnosis not present

## 2020-12-11 DIAGNOSIS — R2681 Unsteadiness on feet: Secondary | ICD-10-CM | POA: Diagnosis not present

## 2020-12-11 DIAGNOSIS — I5033 Acute on chronic diastolic (congestive) heart failure: Secondary | ICD-10-CM | POA: Diagnosis not present

## 2020-12-11 DIAGNOSIS — M6281 Muscle weakness (generalized): Secondary | ICD-10-CM | POA: Diagnosis not present

## 2020-12-11 DIAGNOSIS — Z7901 Long term (current) use of anticoagulants: Secondary | ICD-10-CM | POA: Diagnosis not present

## 2020-12-11 DIAGNOSIS — R278 Other lack of coordination: Secondary | ICD-10-CM | POA: Diagnosis not present

## 2020-12-12 DIAGNOSIS — K219 Gastro-esophageal reflux disease without esophagitis: Secondary | ICD-10-CM | POA: Diagnosis not present

## 2020-12-12 DIAGNOSIS — M6281 Muscle weakness (generalized): Secondary | ICD-10-CM | POA: Diagnosis not present

## 2020-12-12 DIAGNOSIS — R2681 Unsteadiness on feet: Secondary | ICD-10-CM | POA: Diagnosis not present

## 2020-12-12 DIAGNOSIS — M5459 Other low back pain: Secondary | ICD-10-CM | POA: Diagnosis not present

## 2020-12-12 DIAGNOSIS — I872 Venous insufficiency (chronic) (peripheral): Secondary | ICD-10-CM | POA: Diagnosis not present

## 2020-12-12 DIAGNOSIS — G8929 Other chronic pain: Secondary | ICD-10-CM | POA: Diagnosis not present

## 2020-12-12 DIAGNOSIS — I5033 Acute on chronic diastolic (congestive) heart failure: Secondary | ICD-10-CM | POA: Diagnosis not present

## 2020-12-12 DIAGNOSIS — R1312 Dysphagia, oropharyngeal phase: Secondary | ICD-10-CM | POA: Diagnosis not present

## 2020-12-12 DIAGNOSIS — R293 Abnormal posture: Secondary | ICD-10-CM | POA: Diagnosis not present

## 2020-12-12 DIAGNOSIS — R278 Other lack of coordination: Secondary | ICD-10-CM | POA: Diagnosis not present

## 2020-12-13 DIAGNOSIS — L89154 Pressure ulcer of sacral region, stage 4: Secondary | ICD-10-CM | POA: Diagnosis not present

## 2020-12-13 DIAGNOSIS — L988 Other specified disorders of the skin and subcutaneous tissue: Secondary | ICD-10-CM | POA: Diagnosis not present

## 2021-01-12 DEATH — deceased

## 2021-11-15 IMAGING — DX DG CHEST 2V
2 series · 2 of 2 positions shown · non-contrast
Comparison: 02/09/2020

CLINICAL DATA: Chest pain.  Shortness of breath.

EXAM:
CHEST - 2 VIEW

[chest lat]
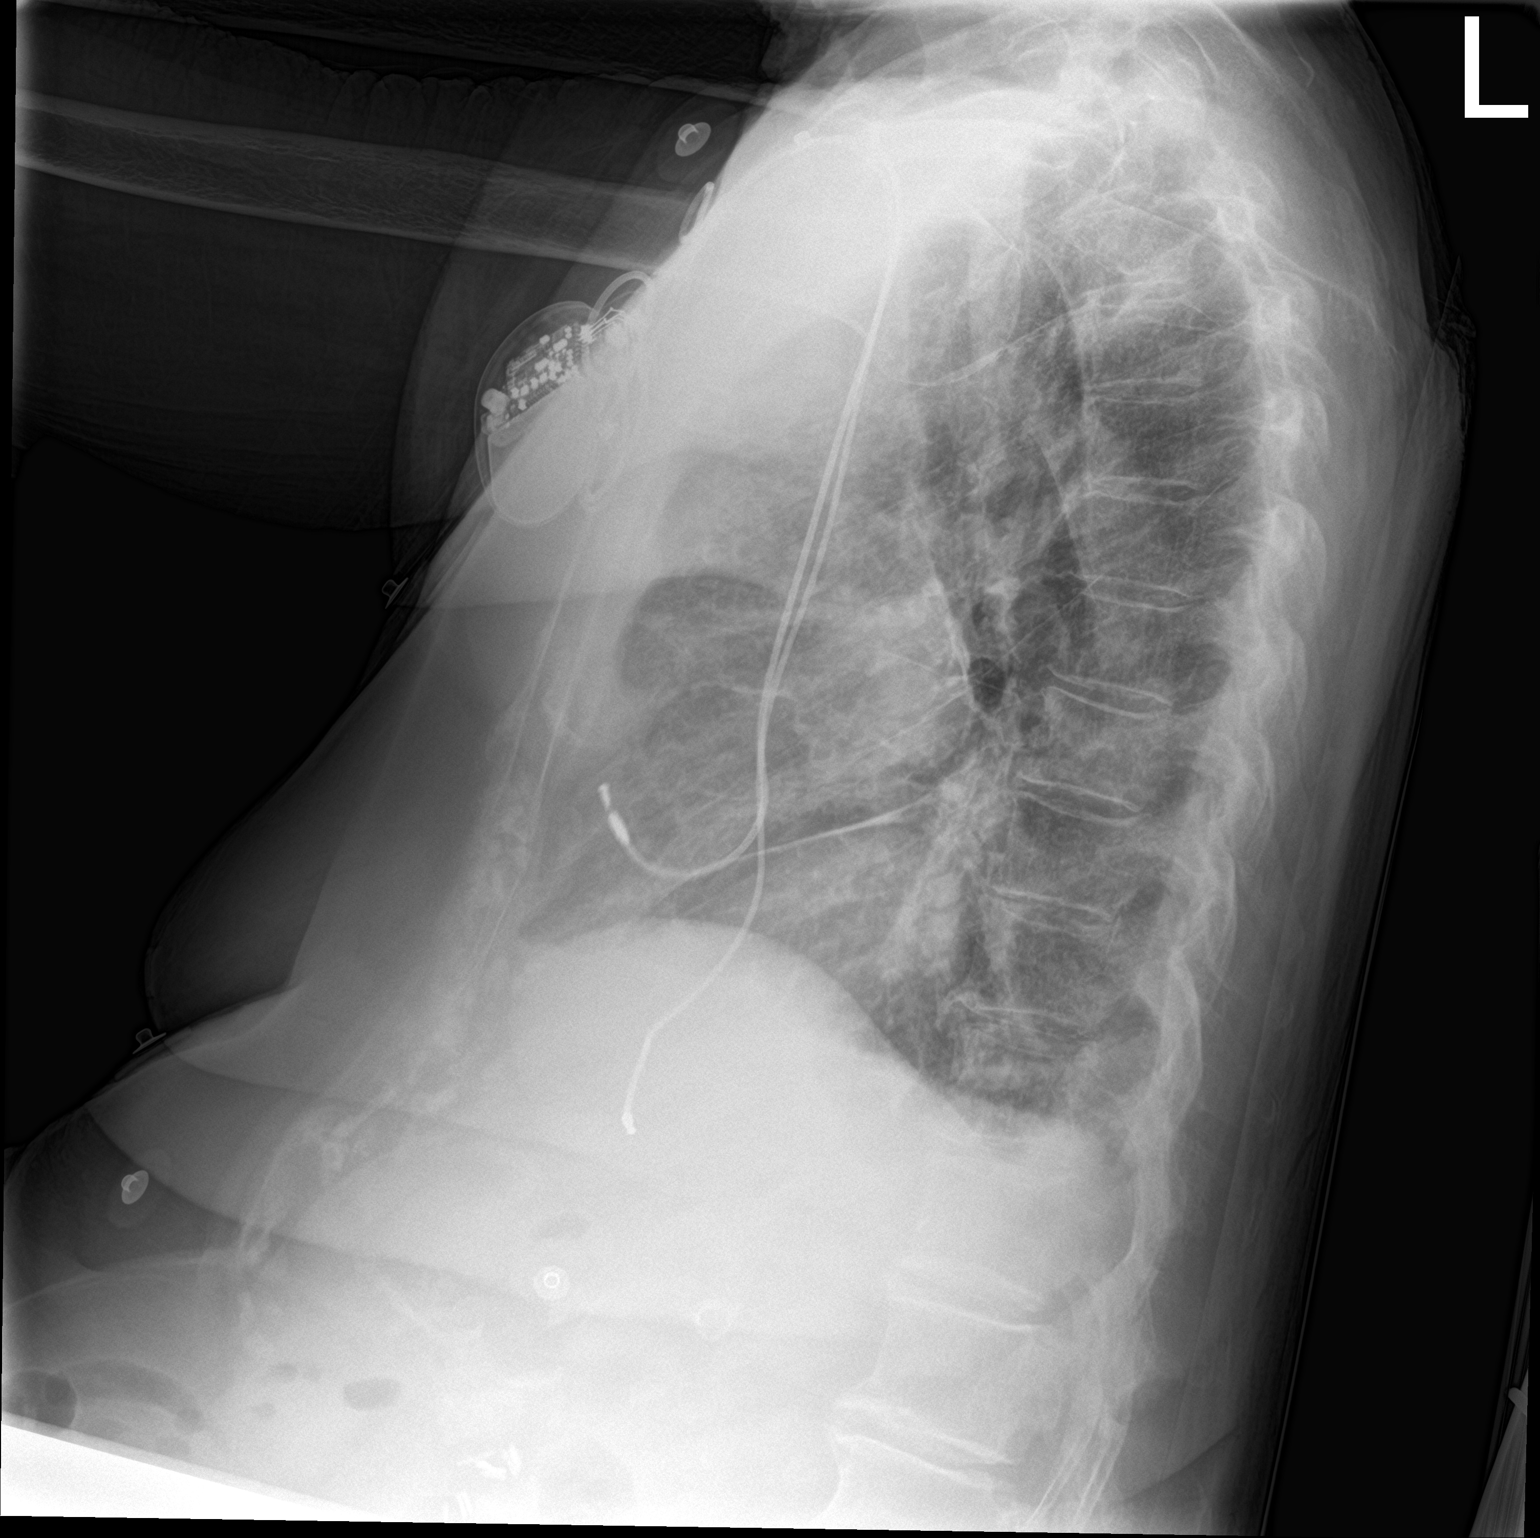

[chest ap]
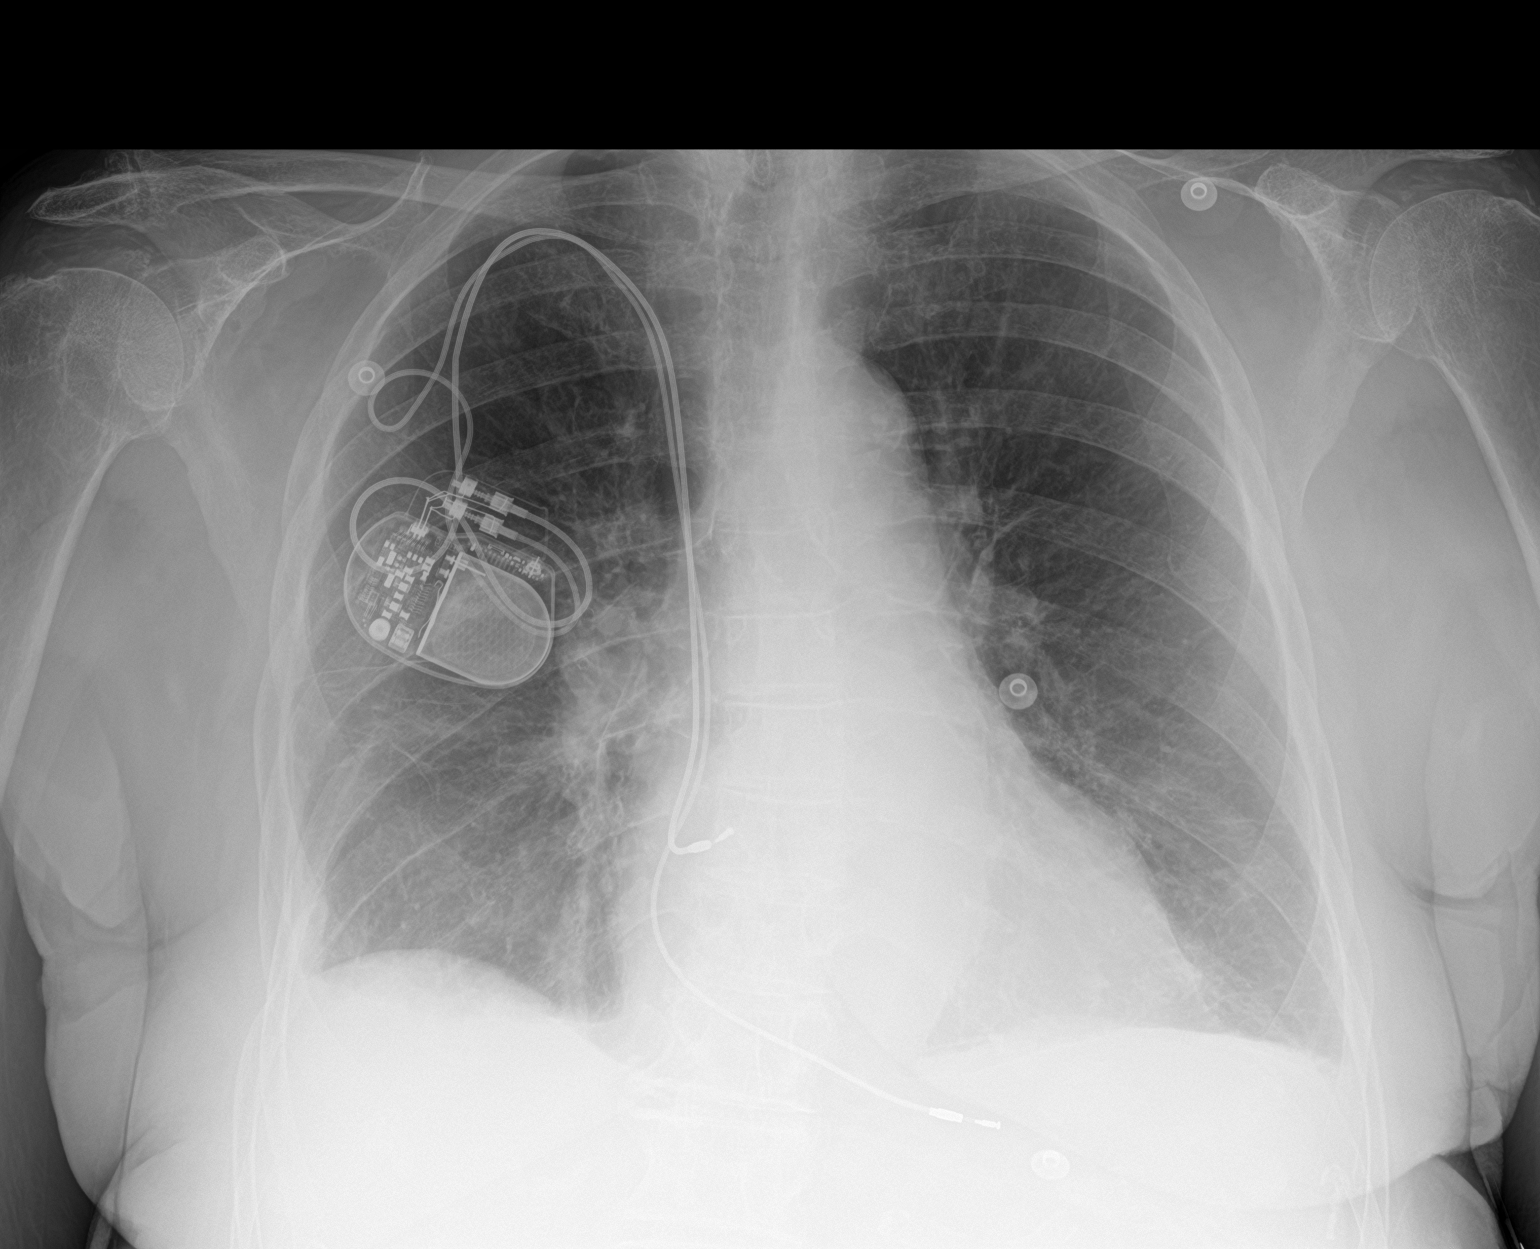

[2 of 2 positions shown; findings below may reference images not displayed]

FINDINGS: Normal heart size and mediastinal contours. Dual-chamber pacer leads
from the right in unremarkable position. Mild scarring in the right
lung and costophrenic sulci. There is no edema, consolidation,
effusion, or pneumothorax.
IMPRESSION: No acute finding when compared with prior.
